# Patient Record
Sex: Male | Born: 1945
Health system: Southern US, Community
[De-identification: ages and names within clinical notes are randomized; demographics above are authoritative.]

## PROBLEM LIST (undated history)

## (undated) DIAGNOSIS — G510 Bell's palsy: Secondary | ICD-10-CM

## (undated) DIAGNOSIS — N200 Calculus of kidney: Secondary | ICD-10-CM

## (undated) DIAGNOSIS — T783XXA Angioneurotic edema, initial encounter: Secondary | ICD-10-CM

## (undated) DIAGNOSIS — M109 Gout, unspecified: Secondary | ICD-10-CM

## (undated) DIAGNOSIS — I219 Acute myocardial infarction, unspecified: Secondary | ICD-10-CM

## (undated) DIAGNOSIS — G43909 Migraine, unspecified, not intractable, without status migrainosus: Secondary | ICD-10-CM

## (undated) DIAGNOSIS — E7800 Pure hypercholesterolemia, unspecified: Secondary | ICD-10-CM

## (undated) DIAGNOSIS — I714 Abdominal aortic aneurysm, without rupture, unspecified: Secondary | ICD-10-CM

## (undated) DIAGNOSIS — I2510 Atherosclerotic heart disease of native coronary artery without angina pectoris: Secondary | ICD-10-CM

## (undated) DIAGNOSIS — I739 Peripheral vascular disease, unspecified: Secondary | ICD-10-CM

## (undated) DIAGNOSIS — M1990 Unspecified osteoarthritis, unspecified site: Secondary | ICD-10-CM

## (undated) DIAGNOSIS — Z95810 Presence of automatic (implantable) cardiac defibrillator: Secondary | ICD-10-CM

## (undated) DIAGNOSIS — I509 Heart failure, unspecified: Secondary | ICD-10-CM

## (undated) DIAGNOSIS — K439 Ventral hernia without obstruction or gangrene: Secondary | ICD-10-CM

## (undated) DIAGNOSIS — I779 Disorder of arteries and arterioles, unspecified: Secondary | ICD-10-CM

## (undated) DIAGNOSIS — I10 Essential (primary) hypertension: Secondary | ICD-10-CM

## (undated) DIAGNOSIS — E119 Type 2 diabetes mellitus without complications: Secondary | ICD-10-CM

## (undated) DIAGNOSIS — I255 Ischemic cardiomyopathy: Secondary | ICD-10-CM

## (undated) DIAGNOSIS — G4730 Sleep apnea, unspecified: Secondary | ICD-10-CM

## (undated) HISTORY — DX: Heart failure, unspecified: I50.9

## (undated) HISTORY — DX: Ischemic cardiomyopathy: I25.5

## (undated) HISTORY — DX: Acute myocardial infarction, unspecified: I21.9

## (undated) HISTORY — PX: EYE SURGERY: SHX253

## (undated) HISTORY — DX: Calculus of kidney: N20.0

## (undated) HISTORY — DX: Abdominal aortic aneurysm, without rupture, unspecified: I71.40

## (undated) HISTORY — DX: Essential (primary) hypertension: I10

## (undated) HISTORY — DX: Atherosclerotic heart disease of native coronary artery without angina pectoris: I25.10

## (undated) HISTORY — DX: Abdominal aortic aneurysm, without rupture: I71.4

## (undated) HISTORY — DX: Bell's palsy: G51.0

## (undated) HISTORY — DX: Type 2 diabetes mellitus without complications: E11.9

## (undated) HISTORY — DX: Peripheral vascular disease, unspecified: I73.9

## (undated) HISTORY — PX: CARDIAC CATHETERIZATION: SHX172

## (undated) HISTORY — DX: Disorder of arteries and arterioles, unspecified: I77.9

## (undated) HISTORY — PX: EXTRACORPOREAL SHOCK WAVE LITHOTRIPSY: SHX1557

## (undated) HISTORY — DX: Angioneurotic edema, initial encounter: T78.3XXA

---
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
## 1999-05-23 HISTORY — PX: CORONARY ARTERY BYPASS GRAFT: SHX141

## 2000-04-12 ENCOUNTER — Inpatient Hospital Stay (HOSPITAL_COMMUNITY): Admission: EM | Admit: 2000-04-12 | Discharge: 2000-04-21 | Payer: Self-pay | Admitting: Cardiology

## 2000-04-14 ENCOUNTER — Encounter: Payer: Self-pay | Admitting: Cardiothoracic Surgery

## 2000-04-16 ENCOUNTER — Encounter: Payer: Self-pay | Admitting: Cardiothoracic Surgery

## 2000-04-17 ENCOUNTER — Encounter: Payer: Self-pay | Admitting: Cardiothoracic Surgery

## 2000-04-18 ENCOUNTER — Encounter: Payer: Self-pay | Admitting: Cardiothoracic Surgery

## 2000-04-19 ENCOUNTER — Encounter: Payer: Self-pay | Admitting: Cardiothoracic Surgery

## 2003-11-27 ENCOUNTER — Inpatient Hospital Stay (HOSPITAL_COMMUNITY): Admission: EM | Admit: 2003-11-27 | Discharge: 2003-12-01 | Payer: Self-pay | Admitting: Cardiology

## 2004-04-11 ENCOUNTER — Ambulatory Visit: Payer: Self-pay | Admitting: Cardiology

## 2004-05-11 ENCOUNTER — Ambulatory Visit: Payer: Self-pay | Admitting: Cardiology

## 2007-05-23 HISTORY — PX: DUPUYTREN CONTRACTURE RELEASE: SHX1478

## 2007-08-26 ENCOUNTER — Ambulatory Visit: Payer: Self-pay | Admitting: Cardiology

## 2007-09-04 ENCOUNTER — Encounter: Payer: Self-pay | Admitting: Cardiology

## 2007-09-05 ENCOUNTER — Ambulatory Visit: Payer: Self-pay | Admitting: Cardiology

## 2007-09-09 ENCOUNTER — Ambulatory Visit: Payer: Self-pay | Admitting: Cardiology

## 2007-09-10 ENCOUNTER — Ambulatory Visit: Payer: Self-pay | Admitting: Cardiology

## 2007-09-11 ENCOUNTER — Ambulatory Visit: Payer: Self-pay | Admitting: Cardiology

## 2007-09-11 ENCOUNTER — Ambulatory Visit (HOSPITAL_COMMUNITY): Admission: RE | Admit: 2007-09-11 | Discharge: 2007-09-12 | Payer: Self-pay | Admitting: Cardiology

## 2007-09-19 ENCOUNTER — Ambulatory Visit: Payer: Self-pay | Admitting: Cardiology

## 2007-10-25 ENCOUNTER — Ambulatory Visit: Payer: Self-pay | Admitting: Cardiology

## 2007-11-21 ENCOUNTER — Ambulatory Visit: Payer: Self-pay | Admitting: Cardiology

## 2007-11-29 ENCOUNTER — Ambulatory Visit: Payer: Self-pay | Admitting: Cardiology

## 2007-11-29 ENCOUNTER — Ambulatory Visit: Payer: Self-pay | Admitting: Vascular Surgery

## 2008-02-10 ENCOUNTER — Encounter: Payer: Self-pay | Admitting: Cardiology

## 2008-02-17 ENCOUNTER — Ambulatory Visit: Payer: Self-pay | Admitting: Cardiology

## 2008-03-04 ENCOUNTER — Encounter: Payer: Self-pay | Admitting: Cardiology

## 2008-05-25 ENCOUNTER — Encounter: Payer: Self-pay | Admitting: Cardiology

## 2008-05-29 ENCOUNTER — Ambulatory Visit: Payer: Self-pay | Admitting: Vascular Surgery

## 2008-07-07 ENCOUNTER — Ambulatory Visit: Payer: Self-pay | Admitting: Cardiology

## 2009-02-04 ENCOUNTER — Encounter: Payer: Self-pay | Admitting: Cardiology

## 2009-02-04 ENCOUNTER — Encounter: Payer: Self-pay | Admitting: Physician Assistant

## 2009-02-05 ENCOUNTER — Encounter: Payer: Self-pay | Admitting: Cardiology

## 2009-02-06 ENCOUNTER — Encounter: Payer: Self-pay | Admitting: Cardiology

## 2009-02-06 ENCOUNTER — Ambulatory Visit: Payer: Self-pay | Admitting: Cardiology

## 2009-02-07 ENCOUNTER — Encounter: Payer: Self-pay | Admitting: Cardiology

## 2009-02-08 ENCOUNTER — Encounter: Payer: Self-pay | Admitting: Cardiology

## 2009-02-22 ENCOUNTER — Ambulatory Visit: Payer: Self-pay | Admitting: Cardiology

## 2009-02-22 DIAGNOSIS — R002 Palpitations: Secondary | ICD-10-CM

## 2009-02-22 DIAGNOSIS — E785 Hyperlipidemia, unspecified: Secondary | ICD-10-CM | POA: Insufficient documentation

## 2009-02-22 DIAGNOSIS — I5022 Chronic systolic (congestive) heart failure: Secondary | ICD-10-CM

## 2009-02-24 ENCOUNTER — Encounter: Payer: Self-pay | Admitting: Physician Assistant

## 2009-03-01 ENCOUNTER — Encounter (INDEPENDENT_AMBULATORY_CARE_PROVIDER_SITE_OTHER): Payer: Self-pay | Admitting: *Deleted

## 2009-03-04 ENCOUNTER — Encounter: Payer: Self-pay | Admitting: Physician Assistant

## 2009-03-08 ENCOUNTER — Telehealth (INDEPENDENT_AMBULATORY_CARE_PROVIDER_SITE_OTHER): Payer: Self-pay | Admitting: *Deleted

## 2009-03-08 ENCOUNTER — Encounter: Payer: Self-pay | Admitting: Physician Assistant

## 2009-03-30 ENCOUNTER — Ambulatory Visit: Payer: Self-pay | Admitting: Cardiology

## 2009-03-30 DIAGNOSIS — I2510 Atherosclerotic heart disease of native coronary artery without angina pectoris: Secondary | ICD-10-CM | POA: Insufficient documentation

## 2009-03-30 DIAGNOSIS — I739 Peripheral vascular disease, unspecified: Secondary | ICD-10-CM | POA: Insufficient documentation

## 2009-03-30 DIAGNOSIS — Z951 Presence of aortocoronary bypass graft: Secondary | ICD-10-CM | POA: Insufficient documentation

## 2009-03-30 DIAGNOSIS — E1149 Type 2 diabetes mellitus with other diabetic neurological complication: Secondary | ICD-10-CM

## 2009-03-30 DIAGNOSIS — I714 Abdominal aortic aneurysm, without rupture, unspecified: Secondary | ICD-10-CM | POA: Insufficient documentation

## 2009-04-01 ENCOUNTER — Encounter: Payer: Self-pay | Admitting: Cardiology

## 2009-04-06 ENCOUNTER — Encounter: Payer: Self-pay | Admitting: Cardiology

## 2009-04-09 ENCOUNTER — Telehealth (INDEPENDENT_AMBULATORY_CARE_PROVIDER_SITE_OTHER): Payer: Self-pay | Admitting: *Deleted

## 2009-04-14 ENCOUNTER — Encounter: Payer: Self-pay | Admitting: Cardiology

## 2009-04-26 ENCOUNTER — Ambulatory Visit: Payer: Self-pay | Admitting: Cardiovascular Disease

## 2009-04-27 ENCOUNTER — Telehealth (INDEPENDENT_AMBULATORY_CARE_PROVIDER_SITE_OTHER): Payer: Self-pay | Admitting: *Deleted

## 2009-04-28 ENCOUNTER — Encounter: Payer: Self-pay | Admitting: Cardiovascular Disease

## 2009-04-28 ENCOUNTER — Ambulatory Visit: Payer: Self-pay | Admitting: Vascular Surgery

## 2009-05-25 ENCOUNTER — Ambulatory Visit: Payer: Self-pay | Admitting: Cardiology

## 2009-05-25 DIAGNOSIS — I724 Aneurysm of artery of lower extremity: Secondary | ICD-10-CM | POA: Insufficient documentation

## 2009-05-26 ENCOUNTER — Encounter: Payer: Self-pay | Admitting: Cardiology

## 2009-07-15 ENCOUNTER — Encounter: Payer: Self-pay | Admitting: Cardiology

## 2009-08-02 ENCOUNTER — Encounter: Payer: Self-pay | Admitting: Cardiology

## 2009-08-17 ENCOUNTER — Encounter: Payer: Self-pay | Admitting: Cardiology

## 2009-09-14 ENCOUNTER — Ambulatory Visit: Payer: Self-pay | Admitting: Cardiology

## 2009-09-16 ENCOUNTER — Telehealth (INDEPENDENT_AMBULATORY_CARE_PROVIDER_SITE_OTHER): Payer: Self-pay | Admitting: *Deleted

## 2009-09-23 ENCOUNTER — Ambulatory Visit: Payer: Self-pay | Admitting: Cardiovascular Disease

## 2009-09-23 DIAGNOSIS — I70219 Atherosclerosis of native arteries of extremities with intermittent claudication, unspecified extremity: Secondary | ICD-10-CM | POA: Insufficient documentation

## 2009-09-27 ENCOUNTER — Telehealth (INDEPENDENT_AMBULATORY_CARE_PROVIDER_SITE_OTHER): Payer: Self-pay | Admitting: *Deleted

## 2009-10-06 ENCOUNTER — Encounter: Payer: Self-pay | Admitting: Cardiology

## 2009-10-06 ENCOUNTER — Ambulatory Visit: Payer: Self-pay | Admitting: Cardiovascular Disease

## 2009-10-06 ENCOUNTER — Ambulatory Visit (HOSPITAL_COMMUNITY): Admission: RE | Admit: 2009-10-06 | Discharge: 2009-10-06 | Payer: Self-pay | Admitting: Cardiovascular Disease

## 2009-10-08 ENCOUNTER — Telehealth: Payer: Self-pay | Admitting: Cardiovascular Disease

## 2009-10-08 ENCOUNTER — Encounter: Payer: Self-pay | Admitting: Cardiovascular Disease

## 2009-10-08 ENCOUNTER — Ambulatory Visit: Payer: Self-pay

## 2009-11-01 ENCOUNTER — Encounter: Payer: Self-pay | Admitting: Cardiology

## 2009-11-05 ENCOUNTER — Ambulatory Visit: Payer: Self-pay | Admitting: Vascular Surgery

## 2009-11-05 ENCOUNTER — Encounter: Payer: Self-pay | Admitting: Cardiovascular Disease

## 2009-11-26 ENCOUNTER — Encounter: Payer: Self-pay | Admitting: Cardiology

## 2009-11-26 DIAGNOSIS — Z951 Presence of aortocoronary bypass graft: Secondary | ICD-10-CM

## 2009-11-30 ENCOUNTER — Encounter: Payer: Self-pay | Admitting: Cardiology

## 2009-11-30 ENCOUNTER — Ambulatory Visit: Payer: Self-pay | Admitting: Cardiology

## 2009-12-14 ENCOUNTER — Ambulatory Visit: Payer: Self-pay | Admitting: Cardiology

## 2009-12-14 DIAGNOSIS — M109 Gout, unspecified: Secondary | ICD-10-CM

## 2010-05-10 ENCOUNTER — Ambulatory Visit: Payer: Self-pay | Admitting: Vascular Surgery

## 2010-05-10 ENCOUNTER — Encounter: Payer: Self-pay | Admitting: Cardiology

## 2010-05-10 ENCOUNTER — Encounter: Payer: Self-pay | Admitting: Vascular Surgery

## 2010-06-15 ENCOUNTER — Ambulatory Visit
Admission: RE | Admit: 2010-06-15 | Discharge: 2010-06-15 | Payer: Self-pay | Source: Home / Self Care | Attending: Cardiology | Admitting: Cardiology

## 2010-06-16 ENCOUNTER — Telehealth (INDEPENDENT_AMBULATORY_CARE_PROVIDER_SITE_OTHER): Payer: Self-pay | Admitting: *Deleted

## 2010-06-17 DIAGNOSIS — R002 Palpitations: Secondary | ICD-10-CM

## 2010-06-23 NOTE — Assessment & Plan Note (Signed)
Summary: 3 mo fu per april reminder-srs   Visit Type:  Follow-up Referring Provider:  Dr Dannielle Burn Primary Provider:  Monico Blitz, MD  CC:  follow-up visit.  History of Present Illness: the patient is a 65 year old male with history of coronary artery disease status post coronary bypass grafting. The patient has ischemic cardiomyopathy. He has exertional angina on medical therapy. He had a catheterization several months ago. He was found to have a long lesion in the vein graft to the obtuse marginal branch which was not amenable to PCI. He is severely limited in his physical activity by claudication in the right leg. He has a known occlusion of the right SFA with an ABI of 0.5. The patient states that his pain in his right leg has become increasingly worse to the point where he is very limited in his activity. Ihe's not able to walk from front to back at [facility]. He is to rest several times because of leg pain but also because of dyspnea and rare occasions angina. He reports several weeks ago that he had several days of social chest pain on minimal exertion which required increasing his nitroglycerin. Those symptoms have now abated. The patient has not been able to resume cardiac rehabilitation due to her recent bout of gout. Of note also is that the patient is followed in [place] for abdominal aortic aneurysm is due for repeat CT scan in June. The patient's lower extremity pain also includes a component of diabetic peripheral neuropathy which has improved with gabapentin. However sluggish his right leg especially worse. Although his angina pattern is now stable he has quite significant angina on exertion. Last office visit his Imdur was increased to120 mg a day.  Preventive Screening-Counseling & Management  Alcohol-Tobacco     Smoking Status: quit     Year Quit: 2009  Current Problems (verified): 1)  Aneurysm of Artery of Lower Extremity  (ICD-442.3) 2)  Aaa  (ICD-441.4) 3)  Diabetic  Peripheral Neuropathy  (ICD-250.60) 4)  Claudication  (ICD-443.9) 5)  Left Ventricular Function, Decreased  (ICD-429.2) 6)  Coronary Artery Bypass Graft, Hx of  (ICD-V45.81) 7)  Dyslipidemia  (ICD-272.4) 8)  Palpitations  (ICD-785.1) 9)  Unspecified Essential Hypertension  (ICD-401.9) 10)  Chronic Systolic Heart Failure  (123456)  Current Medications (verified): 1)  Coreg 25 Mg Tabs (Carvedilol) .... Take 1 Tablet By Mouth Two Times A Day  (Place On File) 2)  Imdur 120 Mg Xr24h-Tab (Isosorbide Mononitrate) .... Take 1 1/2 Tabs (180mg ) Daily 3)  Norvasc 5 Mg Tabs (Amlodipine Besylate) .... Take 1 Tablet By Mouth Once A Day 4)  Furosemide 40 Mg Tabs (Furosemide) .... Take Two Tablet By Mouth in Am and One in Pm 5)  Metformin Hcl 1000 Mg Tabs (Metformin Hcl) .... Take 1 Tablet By Mouth Two Times A Day 6)  Glipizide 10 Mg Tabs (Glipizide) .... Take 1 Tablet By Mouth Two Times A Day 7)  Simvastatin 10 Mg Tabs (Simvastatin) .... Take One Tablet By Mouth Daily At Bedtime 8)  Nitrostat 0.4 Mg Subl (Nitroglycerin) .... Dissolve One Tablet Under Tongue For Severe Chest Pain As Needed Every 5 Minutes, Not To Exceed 3 in 15 Min Time Frame 9)  Aspirin 81 Mg Tbec (Aspirin) .... Take 1 Tablet By Mouth Once A Day 10)  Tart Cherry Advanced  Caps (Misc Natural Products) .... Take 1 Tablet By Mouth Once A Day 11)  Vitamin C Cr 500 Mg Cr-Caps (Ascorbic Acid) .... Take 1 Capsule By Mouth Once  A Day 12)  Neurontin 300 Mg Caps (Gabapentin) .... Take 1 Tablet By Mouth Two Times A Day  Allergies (verified): 1)  ! Lisinopril 2)  ! Codeine  Comments:  Nurse/Medical Assistant: The patient's medications and allergies were reviewed with the patient and were updated in the Medication and Allergy Lists. Bottles reviewed.  Clinical Review Panels:  Echocardiogram Echocardiogram LV chamber size mildly dilated. Mild concentric left ventricular hypertrophy. Moderately decreased LV systolic function. Ejection  fraction 35-40% with multiple segmental wall motion abnormalities. Abnormal diastolic function Moderate left atrial enlargement Trace mitral regurgitation Trace tricuspid regurgitation No pericardial effusion. (02/22/2009)  Vascular Studies ABI's Mild arterial occlusive disease involving the bursitis pedis artery on the left.the posterior tibial and the left is triphasic therefore no significant arterial occlusive disease in the left lower extremity. In the right lower extremity severe arterial occlusive disease his present, involving both inflow from the iliac vasculature or superficial femoral vessels as well as the femoral popliteal system. (04/01/2009)  Cardiac Imaging Cardiac Cath Findings there is moderate reduction in left ventricular function, and an inferior wall motion abnormality. The inferior wall does appear to be relatively akinetic.  The vein graft to the diagonal intermediate is intact as is the large mammary and the distal right is fairly well collateralizeds and maybe infarcted nonetheless.  The only change appears to be in the circumflex distribution with a marked with a marginal is jeopardized.  It is not very favorable vessel for percutaneous intervention Dr. Burt Knack and I have reviewed the films and there is a long total occlusion that does not appear to be favorable for intervention.  We would favor medical treatment with early follow-up and we will add nitrates to the regime.  He will follow-up with Dr Lutricia Feil (10/26/2007)    Past History:  Past Medical History: Last updated: 04/26/2009 exertional angina status post patent bypass graft along the greaterstaff in this vein graf tto an obtuse marginal branch correlating with positive Cardiolite study Ejection fraction of 30 to 35%. Ongoing angina symptoms despite increasing nitrates. Ischemic cardiomyopathy. Palpitations. angioedema secondary to ACE inhibitor therapy. Status post Bell palsy. diabetes  mellitus. Abdominal aortic aneurysm. Hypertension. History of tobacco use. Peripheral arterial disease with total occlusion of the right SFA and an ABI 0.5 on the right side.  Family History: Last updated: 03/30/2009 noncontributory  Social History: Last updated: 03/30/2009 Tobacco Use - Former.   Risk Factors: Smoking Status: quit (09/14/2009)  Review of Systems       The patient complains of weight gain/loss, chest pain, shortness of breath, and depression.  The patient denies fatigue, malaise, fever, vision loss, decreased hearing, hoarseness, palpitations, prolonged cough, wheezing, sleep apnea, coughing up blood, abdominal pain, blood in stool, nausea, vomiting, diarrhea, heartburn, incontinence, blood in urine, muscle weakness, joint pain, leg swelling, rash, skin lesions, headache, fainting, dizziness, anxiety, enlarged lymph nodes, easy bruising or bleeding, and environmental allergies.         claudication  Vital Signs:  Patient profile:   65 year old male Height:      73 inches Weight:      268 pounds Pulse rate:   57 / minute BP sitting:   103 / 58  (left arm) Cuff size:   large  Vitals Entered By: Georgina Peer (September 14, 2009 10:50 AM) CC: follow-up visit   Physical Exam  Additional Exam:  General: Well-developed, well-nourished in no distress head: Normocephalic and atraumatic eyes PERRLA/EOMI intact, conjunctiva and lids normal nose: No deformity  or lesions mouth normal dentition, normal posterior pharynx neck: Supple, no JVD.  No masses, thyromegaly or abnormal cervical nodes lungs: Normal breath sounds bilaterally without wheezing.  Normal percussion heart: regular rate and rhythm with normal S1 and S2, no S3 or S4.  PMI is normal.  No pathological murmurs abdomen: Normal bowel sounds, abdomen is soft and nontender without masses, organomegaly or hernias noted.  No hepatosplenomegaly musculoskeletal: Back normal, normal gait muscle strength and tone  normal pulsus: unable to palpate pulse in right leg dorsalis pedis and posterior tibial pulse. Pulse was palpable in the left leg Extremities: Trace edema neurologic: Alert and oriented x 3 skin: Intact without lesions or rashes cervical nodes: No significant adenopathy psychologic: Normal affect    Impression & Recommendations:  Problem # 1:  LEFT VENTRICULAR FUNCTION, DECREASED (ICD-429.2) ejection fraction stable at 35-40%. No evidence of acute heart failure symptoms.  Problem # 2:  CORONARY ARTERY BYPASS GRAFT, HX OF (ICD-V45.81) the patient does have angina albeit in a stable pattern. He does have a long lesion in the saphenous vein graft to obtuse marginal graft that is not amenable to PCI. I increased his isosorbide mononitrate 180 milligram p.o. q. daily. If the patient continues to have chest pain the addition of her neck said to be considered  Problem # 3:  AAA (ICD-441.4) the patient is due for a CT scan in July of this year. He he is followed by vascular surgery in [place].  Problem # 4:  DIABETIC PERIPHERAL NEUROPATHY (ICD-250.60) improved with gabapentin His updated medication list for this problem includes:    Metformin Hcl 1000 Mg Tabs (Metformin hcl) .Marland Kitchen... Take 1 tablet by mouth two times a day    Glipizide 10 Mg Tabs (Glipizide) .Marland Kitchen... Take 1 tablet by mouth two times a day    Aspirin 81 Mg Tbec (Aspirin) .Marland Kitchen... Take 1 tablet by mouth once a day  Problem # 5:  CLAUDICATION (ICD-443.9) the patient has documented right SFA occlusion with an ABI of 0.5. His claudication however has significantly worsened in the right leg and the patient is questioning whether anything further can be done. I told him that certainly bypass grafting could be considered at the time of aortic aneurysm surgery. However we will refer patient to Dr. Burt Knack to see if any other type of intervention can be considered in the meanwhile.  Patient Instructions: 1)  Increase Imdur to 180mg  daily 2)   Need follow up appointment with Dr. Burt Knack  3)  Follow up in  3 months Prescriptions: IMDUR 120 MG XR24H-TAB (ISOSORBIDE MONONITRATE) take 1 1/2 tabs (180mg ) daily  #45 x 6   Entered by:   Lovina Reach, LPN   Authorized by:   Terald Sleeper, MD, [facility]   Signed by:   Lovina Reach, LPN on 624THL   Method used:   Electronically to        [facility]* (retail)       [address], [place]  [address]       Ph: [phone]       Fax: [phone]   RxID:   [id number]

## 2010-06-23 NOTE — Miscellaneous (Signed)
Summary: Rehab Report/ CARDIAC REHAB PROGRESS REPORT  Rehab Report/ CARDIAC REHAB PROGRESS REPORT   Imported By: Bartholomew Boards 08/03/2009 09:14:23  _____________________________________________________________________  External Attachment:    Type:   Image     Comment:   External Document

## 2010-06-23 NOTE — Miscellaneous (Signed)
Summary: 2 D ECHO  Clinical Lists Changes  Problems: Added new problem of SHORTNESS OF BREATH (ICD-786.05) Orders: Added new Referral order of 2-D Echocardiogram (2D Echo) - Signed

## 2010-06-23 NOTE — Letter (Signed)
Summary: [facility]   [facility]   Imported By: Sallee Provencal 12/01/2009 15:34:29  _____________________________________________________________________  External Attachment:    Type:   Image     Comment:   External Document

## 2010-06-23 NOTE — Progress Notes (Signed)
Summary: Groin [place]  Phone Note Call from Patient   Caller: Patient Call For: Nurse Summary of Call: I spoke with the pt and he called because of bruising and swelling at his groin site.  The pt said yesterday the bruising was the size of a quater.  Today the bruising has spread to his groin area.  The pt does have some swelling at this site.  The pt denies pain but does have some soreness.  I will arrange for the pt to come into the office today for a groin duplex. Initial call taken by: Theodosia Quay, RN, BSN,  Oct 08, 2009 1:17 PM

## 2010-06-23 NOTE — Progress Notes (Signed)
Summary: low bp  Phone Note Call from Patient   Summary of Call: 78/48 blood pressure dropped this morning during reham.   Just checked 95/51 most recently.  Stated he did feel the same way he feels when sugar drops.  Now feels lightheaded and weak.  States he really feels like it does when sugar is low.  States he has been drinking lots of water, staying thirsty all the time.  Informed patient that may be related to his diabetes not being well controlled.  Suggested he drink G2 - watch sugar intake due to diabetes.  Just recently increased Imdur at last OV.  Advised pt. to go to ER for evaluation if symptoms worsen. Patient verbalized understanding.  Initial call taken by: Lovina Reach, LPN,  April 28, 624THL 5:15 PM  Follow-up for Phone Call        Also, said that you suggested that he get a walker, but needs rx sent to [facility].   Lovina Reach, LPN  April 29, 624THL [id number] PM   Additional Follow-up for Phone Call Additional follow up Details #1::        Please go ahead and do this. He also needs RN visit this week for orthostatics. May need to adjust meds.  Additional Follow-up by: Terald Sleeper, MD, [facility],  Sep 20, 2009 12:57 PM    Additional Follow-up for Phone Call Additional follow up Details #2::    Pt. notified.  Seems to be doing better now.  Offered nurse vistit for tomorrow or Friday.  Has appt. with Dr. Burt Knack in the morning.  Also, has appt. with PMD Manuella Ghazi) on Friday at 11:45.  Advised him to follow up with PMD about these issues since he already has this appointment scheduled.  Will send order for walker with seat to [facility] at his request.  Also, advised him that if PMD feels he needs to be seen sooner by [place] to please call and request.  Patient verbalized understanding.  Lovina Reach, LPN  May  4, 624THL [id number] PM  Follow-up by: Terald Sleeper, MD, [facility],  Sep 26, 2009 4:24 PM  New/Updated Medications: STEP N REST WALKER  MISC (MISC. DEVICES) use as directed Prescriptions: STEP N REST WALKER  MISC  (MISC. DEVICES) use as directed  #1 x 1   Entered by:   Lovina Reach, LPN   Authorized by:   Terald Sleeper, MD, [facility]   Signed by:   Lovina Reach, LPN on [id number]   Method used:   Electronically to        [facility]* (retail)       [address] [place], [place]  [address]       Ph: [phone]       Fax: [phone]   RxID:   [id number]

## 2010-06-23 NOTE — Assessment & Plan Note (Signed)
Summary: f36m   Visit Type:  6 months follow up Referring Provider:  Dr Dannielle Burn Primary Provider:  Monico Blitz, MD  CC:  Right leg pain.  History of Present Illness: 65 year-old gentleman presenting for follow-up of lower extremity PAD. He was seen several months ago for intermittent claudication. Since that time he reports worsening right leg pain with ambulation, predominately in the right calf. He complains of severe right calf and foot pain with less than 50 feet of walking. He has to stop and rest several times while shopping in a department store. He denies rest pain. Reports minimal symptoms on the left leg.   His right ABI is 0.5 and CTA showed total occlusion of the right SFA, reconsitituting in the above knee popliteal artery.  Current Medications (verified): 1)  Coreg 25 Mg Tabs (Carvedilol) .... Take 1 Tablet By Mouth Two Times A Day  (Place On File) 2)  Imdur 120 Mg Xr24h-Tab (Isosorbide Mononitrate) .... Take 1 1/2 Tabs (180mg ) Daily 3)  Norvasc 5 Mg Tabs (Amlodipine Besylate) .... Take 1 Tablet By Mouth Once A Day 4)  Furosemide 40 Mg Tabs (Furosemide) .... Take Two Tablet By Mouth in Am and One in Pm 5)  Metformin Hcl 1000 Mg Tabs (Metformin Hcl) .... Take 1 Tablet By Mouth Two Times A Day 6)  Glipizide 10 Mg Tabs (Glipizide) .... Take 1 Tablet By Mouth Two Times A Day 7)  Simvastatin 10 Mg Tabs (Simvastatin) .... Take One Tablet By Mouth Daily At Bedtime 8)  Nitrostat 0.4 Mg Subl (Nitroglycerin) .... Dissolve One Tablet Under Tongue For Severe Chest Pain As Needed Every 5 Minutes, Not To Exceed 3 in 15 Min Time Frame 9)  Aspirin 81 Mg Tbec (Aspirin) .... Take 1 Tablet By Mouth Two Times A Day 10)  Tart Cherry Advanced  Caps (Misc Natural Products) .... Take 1 Tablet By Mouth Once A Day 11)  Vitamin C Cr 500 Mg Cr-Caps (Ascorbic Acid) .... Take 1 Capsule By Mouth Once A Day 12)  Neurontin 300 Mg Caps (Gabapentin) .... Take 1 Tablet By Mouth Two Times A Day 13)  Step N Rest  Walker  Misc (Misc. Devices) .... Use As Directed  Allergies: 1)  ! Lisinopril 2)  ! Codeine  Past History:  Past medical history reviewed for relevance to current acute and chronic problems.  Past Medical History: Reviewed history from 04/26/2009 and no changes required. exertional angina status post patent bypass graft along the greaterstaff in this vein graf tto an obtuse marginal branch correlating with positive Cardiolite study Ejection fraction of 30 to 35%. Ongoing angina symptoms despite increasing nitrates. Ischemic cardiomyopathy. Palpitations. angioedema secondary to ACE inhibitor therapy. Status post Bell palsy. diabetes mellitus. Abdominal aortic aneurysm. Hypertension. History of tobacco use. Peripheral arterial disease with total occlusion of the right SFA and an ABI 0.5 on the right side.  Review of Systems       Positive for exertional angina, improved with increased nitrates. Otherwise negative except as per HPI.  Vital Signs:  Patient profile:   65 year old male Height:      73 inches Weight:      269 pounds BMI:     35.62 Pulse rate:   64 / minute Pulse rhythm:   regular Resp:     18 per minute BP sitting:   100 / 64  (left arm) Cuff size:   large  Vitals Entered By: Sidney Ace (Sep 23, 2009 10:20 AM)  Serial  Vital Signs/Assessments:  Time      Position  BP       Pulse  Resp  Temp     By           R Arm     102/60                         Sidney Ace   Physical Exam  General:  Pt is alert and oriented,  obese male, in no acute distress. HEENT: normal Neck: normal carotid upstrokes without bruits, JVP normal Lungs: CTA CV: RRR without murmur or gallop Abd: soft, NT, positive BS, no bruit, no organomegaly Ext: no clubbing, cyanosis, or edema. femoral pulses are 2+=, pedals not palpable. Skin: warm and dry without rash    Impression & Recommendations:  Problem # 1:  ATHEROSLERO NATV ART EXTREM W/INTERMIT CLAUDICAT (ICD-440.21) The  patient has severe and progressive right calf claudication, now with low-level activity.  While he does not have resting leg ischemia, his symptoms warrant further evaluation and treatment. Recommend moving forward with a lower extremity angiogram with consideration for PTA depending on whether his anatomy is favorable for this. Risks and indications of angiography/PTA reviewed with the patient in detail and he agrees to proceed.  Will need to consider his CHF and ischemic heart disease when determining best revascularization strategy (endovascular versus surgical).  Orders: PV Procedure (PV Procedure)  Patient Instructions: 1)  Your physician has requested that you have a peripheral vascular angiogram. This exam is performed at the hospital. During this exam IV contrast is used to look at arterial blood flow.  Please review the information sheet given for details. 2)  Your physician recommends that you continue on your current medications as directed. Please refer to the Current Medication list given to you today.

## 2010-06-23 NOTE — Assessment & Plan Note (Signed)
Summary: 1  mo fu -srs   Visit Type:  Follow-up Referring Provider:  Dr Dannielle Burn Primary Provider:  Monico Blitz, MD  CC:  follow-up visit.  History of Present Illness: the patient is a 65 year old male with a history of coronary artery disease status post coronary bypass grafting and ischemic cardiomyopathy. He has an ejection fraction of 35-40%. He has an abdominal aortic aneurysm followed in [facility] by Dr. early. The patient also has significant peripheral vascular disease with claudication right leg as well as neuropathic pains in both lower extremities secondary to diabetes. The patient stated he had an episode of severe angina around Christmas. This was induced by the cold weather. He reports that this was his first angina back in a long while. He has been doing cardiac rehabilitation and has not experienced any chest pain. However he states that most of his exercises he does sitting down with some minor exercise on the treadmill. He usually develops leg pain on the treadmill. He has been started on gabapentin with improvement in his neuropathic pains. He currently denies any orthopnea PND palpitations or syncope. The patient had a catheterization done earlier this year and was not felt long lesion in the vein graft to the obtuse marginal branch which was not amenable to percutaneous coronary intervention.   Preventive Screening-Counseling & Management  Alcohol-Tobacco     Smoking Status: quit     Year Quit: 2009  Current Problems (verified): 1)  Aneurysm of Artery of Lower Extremity  (ICD-442.3) 2)  Aaa  (ICD-441.4) 3)  Diabetic Peripheral Neuropathy  (ICD-250.60) 4)  Claudication  (ICD-443.9) 5)  Left Ventricular Function, Decreased  (ICD-429.2) 6)  Coronary Artery Bypass Graft, Hx of  (ICD-V45.81) 7)  Dyslipidemia  (ICD-272.4) 8)  Palpitations  (ICD-785.1) 9)  Unspecified Essential Hypertension  (ICD-401.9) 10)  Chronic Systolic Heart Failure  (123456)  Current  Medications (verified): 1)  Coreg 25 Mg Tabs (Carvedilol) .... Take 1 Tablet By Mouth Two Times A Day  (Place On File) 2)  Imdur 120 Mg Xr24h-Tab (Isosorbide Mononitrate) .... Take 1 Tablet By Mouth Once A Day (Place On File) 3)  Norvasc 5 Mg Tabs (Amlodipine Besylate) .... Take 1 Tablet By Mouth Once A Day 4)  Furosemide 40 Mg Tabs (Furosemide) .... Take Two Tablet By Mouth in Am and One in Pm 5)  Metformin Hcl 1000 Mg Tabs (Metformin Hcl) .... Take 1 Tablet By Mouth Two Times A Day 6)  Glipizide 5 Mg Tabs (Glipizide) .... Take 1 Tablet By Mouth Two Times A Day 7)  Ropinirole Hcl 1 Mg Tabs (Ropinirole Hcl) .... Take 1 Tab By Mouth At Bedtime 8)  Simvastatin 10 Mg Tabs (Simvastatin) .... Take One Tablet By Mouth Daily At Bedtime 9)  Nitrostat 0.4 Mg Subl (Nitroglycerin) .... Dissolve One Tablet Under Tongue For Severe Chest Pain As Needed Every 5 Minutes, Not To Exceed 3 in 15 Min Time Frame 10)  Aspirin 81 Mg Tbec (Aspirin) .... Take 1 Tablet By Mouth Once A Day 11)  Tart Cherry Advanced  Caps (Misc Natural Products) .... Take 1 Tablet By Mouth Once A Day 12)  Vitamin C Cr 500 Mg Cr-Caps (Ascorbic Acid) .... Take 1 Capsule By Mouth Once A Day 13)  Neurontin 300 Mg Caps (Gabapentin) .... Take 1 Tablet By Mouth Two Times A Day  (Place On File)  Allergies (verified): 1)  ! Lisinopril 2)  ! Codeine  Comments:  Nurse/Medical Assistant: The patient's medications and allergies were reviewed with the  patient and were updated in the Medication and Allergy Lists. Patient brought list to office.  Past History:  Family History: Last updated: 03/30/2009 noncontributory  Social History: Last updated: 03/30/2009 Tobacco Use - Former.   Past Medical History: Reviewed history from 04/26/2009 and no changes required. exertional angina status post patent bypass graft along the greaterstaff in this vein graf tto an obtuse marginal branch correlating with positive Cardiolite study Ejection fraction  of 30 to 35%. Ongoing angina symptoms despite increasing nitrates. Ischemic cardiomyopathy. Palpitations. angioedema secondary to ACE inhibitor therapy. Status post Bell palsy. diabetes mellitus. Abdominal aortic aneurysm. Hypertension. History of tobacco use. Peripheral arterial disease with total occlusion of the right SFA and an ABI 0.5 on the right side.  Review of Systems       The patient complains of fatigue and chest pain.  The patient denies malaise, fever, weight gain/loss, vision loss, decreased hearing, hoarseness, palpitations, shortness of breath, prolonged cough, wheezing, sleep apnea, coughing up blood, abdominal pain, blood in stool, nausea, vomiting, diarrhea, heartburn, incontinence, blood in urine, muscle weakness, joint pain, leg swelling, rash, skin lesions, headache, fainting, dizziness, depression, anxiety, enlarged lymph nodes, easy bruising or bleeding, and environmental allergies.    Vital Signs:  Patient profile:   66 year old male Height:      73 inches Weight:      275 pounds Pulse rate:   74 / minute BP sitting:   122 / 70  (left arm) Cuff size:   large  Vitals Entered By: Georgina Peer (May 25, 2009 11:18 AM) CC: follow-up visit   Physical Exam  General:  Well developed, well nourished, in no acute distress. Head:  normocephalic and atraumatic Nose:  no deformity, discharge, inflammation, or lesions Mouth:  Teeth, gums and palate normal. Oral mucosa normal. Neck:  Neck supple, no JVD. No masses, thyromegaly or abnormal cervical nodes. Lungs:  Clear bilaterally to auscultation and percussion. Heart:  Non-displaced PMI, chest non-tender; regular rate and rhythm, S1, S2 without murmurs, rubs or gallops. Carotid upstroke normal, no bruit. Normal abdominal aortic size, no bruits. Femorals normal pulses, no bruits. Pedals normal pulses. No edema, no varicosities. Abdomen:  Bowel sounds positive; abdomen soft and non-tender without masses,  organomegaly, or hernias noted. No hepatosplenomegaly. Msk:  Back normal, normal gait. Muscle strength and tone normal. Pulses:  decreased pulses in her right leg. Extremities:  No clubbing or cyanosis. Neurologic:  Alert and oriented x 3. Skin:  Intact without lesions or rashes. Cervical Nodes:  no significant adenopathy Psych:  Normal affect.   CT Scan  Procedure date:  04/14/2009  Findings:      abdominal aortic aneurysm with maximal AP diameter 5.4 cm increase from prior study Aneurysmal dilatation of the right common iliac artery Right superficial femoral artery occlusion and three-vessel runoff to the right ankle No significant occlusive disease in the left lower extremity Moderate atherosclerotic changes of the superficial femoral artery Left popliteal artery aneurysm above the knee 2.7 cm Umbilical hernia containing adipose tissue Posttraumatic changes of the spleen Right nephrolithiasis  Impression & Recommendations:  Problem # 1:  CORONARY ARTERY BYPASS GRAFT, HX OF (ICD-V45.81) the patient had an episode of angina several weeks ago around Christmas. I will increase his Imdur 120mg  a dayalso increase carvedilol to 25 mg p.o. b.i.d.if the patient has recurrent chest pain further ischemia evaluation will be necessary. Unfortunately patient has a vein graft to obtuse marginal branch that is the cause for ischemia but is not  amenable to PCI  Problem # 2:  AAA (ICD-441.4) this is followed in [place] recommendations are for follow up study in 6 months. Of note is also that the patient has occluded right superficial femoral artery with claudication right leg. He also has a popliteal aneurysm in the left leg and iliofemoral aneurysm. Further recommendations are per the vascular surgeons.  Problem # 3:  CLAUDICATION (ICD-443.9) the patient's chronic stable claudication right leg but no limb ischemia at rest. His neuropathic pain has greatly improved with gabapentin  Problem #  4:  DIABETIC PERIPHERAL NEUROPATHY (ICD-250.60) improved with gabapentin. Will further increase it to 300 mg p.o. b.i.d. His updated medication list for this problem includes:    Metformin Hcl 1000 Mg Tabs (Metformin hcl) .Marland Kitchen... Take 1 tablet by mouth two times a day    Glipizide 5 Mg Tabs (Glipizide) .Marland Kitchen... Take 1 tablet by mouth two times a day    Aspirin 81 Mg Tbec (Aspirin) .Marland Kitchen... Take 1 tablet by mouth once a day  Problem # 5:  DYSLIPIDEMIA (ICD-272.4) follow up lipid panel and LFTs. His updated medication list for this problem includes:    Simvastatin 10 Mg Tabs (Simvastatin) .Marland Kitchen... Take one tablet by mouth daily at bedtime  Problem # 6:  CHRONIC SYSTOLIC HEART FAILURE (123456) no evidence of volume overload. Continue current medical regimen. Ejection fraction 35-40% His updated medication list for this problem includes:    Coreg 25 Mg Tabs (Carvedilol) .Marland Kitchen... Take 1 tablet by mouth two times a day  (place on file)    Imdur 120 Mg Xr24h-tab (Isosorbide mononitrate) .Marland Kitchen... Take 1 tablet by mouth once a day (place on file)    Norvasc 5 Mg Tabs (Amlodipine besylate) .Marland Kitchen... Take 1 tablet by mouth once a day    Furosemide 40 Mg Tabs (Furosemide) .Marland Kitchen... Take two tablet by mouth in am and one in pm    Nitrostat 0.4 Mg Subl (Nitroglycerin) .Marland Kitchen... Dissolve one tablet under tongue for severe chest pain as needed every 5 minutes, not to exceed 3 in 15 min time frame    Aspirin 81 Mg Tbec (Aspirin) .Marland Kitchen... Take 1 tablet by mouth once a day  Problem # 7:  ANEURYSM OF ARTERY OF LOWER EXTREMITY (ICD-442.3) Assessment: Comment Only  Patient Instructions: 1)  Increase Coreg to 25mg  two times a day 2)  Increase Indur to 120mg  daily  3)  Increase Neurontin to 300mg  two times a day 4)  Follow up in 3 months. Prescriptions: NEURONTIN 300 MG CAPS (GABAPENTIN) Take 1 tablet by mouth two times a day  (PLACE ON FILE)  #60 x 2   Entered by:   Lovina Reach, LPN   Authorized by:   Terald Sleeper, MD, [facility]   Signed  by:   Lovina Reach, LPN on 624THL   Method used:   Electronically to        [facility] (retail)       [address], [place]  [address]       Ph: [phone]       Fax: [phone]   RxID:   [id number] IMDUR 120 MG XR24H-TAB (ISOSORBIDE MONONITRATE) Take 1 tablet by mouth once a day (PLACE ON FILE)  #30 x 6   Entered by:   Lovina Reach, LPN   Authorized by:   Terald Sleeper, MD, [facility]   Signed by:   Lovina Reach, LPN on 624THL  Method used:   Electronically to        [facility] (retail)       [address], [place]  [address]       Ph: [phone]       Fax: [phone]   RxID:   [id number] COREG 25 MG TABS (CARVEDILOL) Take 1 tablet by mouth two times a day  (PLACE ON FILE)  #60 x 6   Entered by:   Lovina Reach, LPN   Authorized by:   Terald Sleeper, MD, [facility]   Signed by:   Lovina Reach, LPN on 624THL   Method used:   Electronically to        [facility] (retail)       [address], [place]  [address]       Ph: [phone]       Fax: [phone]   RxID:   [id number]

## 2010-06-23 NOTE — Letter (Signed)
Summary: Peripheral Vascular  [place] [place] [address] [place]   [place], [place] [address]   Phone: [phone]  Fax: [phone]     09/23/2009 MRN: [id number]  [address] [place], [place]  [address]  Dear Mr. Grulke,   You are scheduled for Peripheral Vascular Angiogram on Wednesday Oct 06, 2009 with Dr. Burt Knack.  Please arrive at the [facility] at 8:00      a.m. on the day of your procedure.  1. DIET     _X___ Nothing to eat or drink after midnight except your medications with a sip of water.  2. MAKE SURE YOU TAKE YOUR ASPIRIN.  3. __X___ DO NOT TAKE these medications before your procedure:      DO NOT TAKE FUROSEMIDE OR GLIPIZIDE THE MORNING OF PROCEDURE.  DO NOT TAKE METFORMIN THE NIGHT BEFORE, MORNING OF, OR 48 HOURS AFTER PROCEDURE.         __X__ YOU MAY TAKE ALL of your remaining medications with a small amount of water.      4. Plan for one night stay - bring personal belongings (i.e. toothpaste, toothbrush, etc.)  5. Bring a current list of your medications and current insurance cards.  6. Must have a responsible person to drive you home.   7. Someone must be with you for the first 24 hours after you arrive home.  8. Please wear clothes that are easy to get on and off and wear slip-on shoes.  *Special note: Every effort is made to have your procedure done on time.  Occasionally there are emergencies that present themselves at the hospital that may cause delays.  Please be patient if a delay does occur.  If you have any questions after you get home, please call the office at the number listed above.   Theodosia Quay, RN, BSN

## 2010-06-23 NOTE — Assessment & Plan Note (Signed)
Summary: 6 MO FU   Visit Type:  Follow-up Referring Provider:  Dr Dannielle Burn Primary Provider:  Monico Blitz, MD   History of Present Illness: the patient is a 65 year old male with history of coronary bypass grafting, peripheral vascular disease, abdominal aortic aneurysm followed by vascular surgery.  The patient has ischemic cardio myopathy with improvement in ejection fraction on echocardiogram 6 months ago to 45 to 50%.  The patient continues to complain of claudication his right leg.  abdominal aortic aneurysm is followed as well as his peripheral vascular disease.  The patient has a known long stenosis in the vein graft causing him to have chronic stable angina.  He states that for several months he did not require any nitroglycerin but in the last few weeks has used a few.  His main concern now is that he has frequent palpitations that almost appear daily.  Initially they occur at night's that occurred during the day and sometimes lasts several minutes.  He feels weak when they occur but no shortness of breath orthopnea or PND.  He has had no presyncope or syncope associated with this.  Preventive Screening-Counseling & Management  Alcohol-Tobacco     Smoking Status: quit     Year Quit: 2009  Current Medications (verified): 1)  Coreg 25 Mg Tabs (Carvedilol) .... Take 1 Tablet By Mouth Two Times A Day  (Place On File) 2)  Imdur 120 Mg Xr24h-Tab (Isosorbide Mononitrate) .... Take 1 1/2 Tabs (180mg ) Daily 3)  Norvasc 5 Mg Tabs (Amlodipine Besylate) .... Take 1 Tablet By Mouth Once A Day 4)  Furosemide 40 Mg Tabs (Furosemide) .... Take 2 Tabs Every Morning 5)  Metformin Hcl 1000 Mg Tabs (Metformin Hcl) .... Take 1 Tablet By Mouth Two Times A Day 6)  Glipizide 10 Mg Tabs (Glipizide) .... Take 1 Tablet By Mouth Two Times A Day 7)  Simvastatin 10 Mg Tabs (Simvastatin) .... Take One Tablet By Mouth Daily At Bedtime 8)  Nitrostat 0.4 Mg Subl (Nitroglycerin) .... Dissolve One Tablet Under Tongue  For Severe Chest Pain As Needed Every 5 Minutes, Not To Exceed 3 in 15 Min Time Frame 9)  Aspirin 81 Mg Tbec (Aspirin) .... Take 1 Tablet By Mouth Two Times A Day 10)  Tart Cherry Advanced  Caps (Misc Natural Products) .... Take 1 Tablet By Mouth Once A Day 11)  Vitamin C Cr 500 Mg Cr-Caps (Ascorbic Acid) .... Take 1 Capsule By Mouth Once A Day 12)  Neurontin 300 Mg Caps (Gabapentin) .... Take 1 Tablet By Mouth Two Times A Day 13)  Allopurinol 100 Mg Tabs (Allopurinol) .... Take 1 Tablet By Mouth Once A Day 14)  Byetta 5 Mcg Pen 5 Mcg/0.83ml Soln (Exenatide) .... One Injection Two Times A Day  Allergies (verified): 1)  ! Lisinopril 2)  ! Codeine  Comments:  Nurse/Medical Assistant: The patient's medication list and allergies were reviewed with the patient and were updated in the Medication and Allergy Lists.  Past History:  Past Medical History: Last updated: 04/26/2009 exertional angina status post patent bypass graft along the greaterstaff in this vein graf tto an obtuse marginal branch correlating with positive Cardiolite study Ejection fraction of 30 to 35%. Ongoing angina symptoms despite increasing nitrates. Ischemic cardiomyopathy. Palpitations. angioedema secondary to ACE inhibitor therapy. Status post Bell palsy. diabetes mellitus. Abdominal aortic aneurysm. Hypertension. History of tobacco use. Peripheral arterial disease with total occlusion of the right SFA and an ABI 0.5 on the right side.  Family History: Reviewed  history from 03/30/2009 and no changes required.  Negative FH of Diabetes, Hypertension, or Coronary Artery Disease  Social History: Reviewed history from 03/30/2009 and no changes required. Tobacco Use - Former.   Review of Systems       The patient complains of chest pain, shortness of breath, dizziness, and anxiety.  The patient denies fatigue, malaise, fever, weight gain/loss, vision loss, decreased hearing, hoarseness, palpitations, prolonged  cough, wheezing, sleep apnea, coughing up blood, abdominal pain, blood in stool, nausea, vomiting, diarrhea, heartburn, incontinence, blood in urine, muscle weakness, joint pain, leg swelling, rash, skin lesions, headache, fainting, depression, enlarged lymph nodes, easy bruising or bleeding, and environmental allergies.    Vital Signs:  Patient profile:   65 year old male Height:      73 inches Weight:      262 pounds Pulse rate:   74 / minute BP sitting:   96 / 61  (left arm) Cuff size:   large  Vitals Entered By: Georgina Peer (June 15, 2010 1:53 PM)  Serial Vital Signs/Assessments:  Time      Position  BP       Pulse  Resp  Temp     By 1:55 PM             93/56    71                    Georgina Peer  Comments: 1:55 PM right arm large cuff By: Georgina Peer    Physical Exam  Additional Exam:  General: Well-developed, well-nourished in no distress head: Normocephalic and atraumatic eyes PERRLA/EOMI intact, conjunctiva and lids normal nose: No deformity or lesions mouth normal dentition, normal posterior pharynx neck: Supple, no JVD.  No masses, thyromegaly or abnormal cervical nodes lungs: Normal breath sounds bilaterally without wheezing.  Normal percussion heart: regular rate and rhythm with normal S1 and S2, no S3 or S4.  PMI is normal.  No pathological murmurs abdomen: Normal bowel sounds, abdomen is soft and nontender without masses, organomegaly or hernias noted.  No hepatosplenomegaly musculoskeletal: Back normal, normal gait muscle strength and tone normal pulsus: unable to palpate pulse in right leg dorsalis pedis and posterior tibial pulse. Pulse was palpable in the left leg Extremities: Trace edema neurologic: Alert and oriented x 3 skin: Intact without lesions or rashes cervical nodes: No significant adenopathy psychologic: Normal affect    Impression & Recommendations:  Problem # 1:  GOUT (ICD-274.9) symptoms have been in remission since the  patient was placed on allopurinol for elevated for uric acid levels. removed for medication list    Colcrys 0.6 Mg Tabs (Colchicine) .Marland Kitchen... Take one tab by mouth x 1, may repeat  one hour later, then take one tab two times a day x 14 days then stop His updated medication list for this problem includes:    Allopurinol 100 Mg Tabs (Allopurinol) .Marland Kitchen... Take 1 tablet by mouth once a day  Problem # 2:  SHORTNESS OF BREATH (ICD-786.05) patient has chronic dyspnea but this appears to be stable. His updated medication list for this problem includes:    Coreg 25 Mg Tabs (Carvedilol) .Marland Kitchen... Take 1 tablet by mouth two times a day  (place on file)    Norvasc 5 Mg Tabs (Amlodipine besylate) .Marland Kitchen... Take 1 tablet by mouth once a day    Furosemide 40 Mg Tabs (Furosemide) .Marland Kitchen... Take 2 tabs every morning    Aspirin 81 Mg Tbec (Aspirin) .Marland Kitchen... Take 1 tablet  by mouth two times a day  Problem # 3:  ATHEROSLERO NATV ART EXTREM W/INTERMIT CLAUDICAT (ICD-440.21) the patient has claudication in the right leg.  He will call us back with a prescription that he received from his primary care physician previously.  We will refill this medication it appears that he was on nonsteroidal  Problem # 4:  AAA (ICD-441.4) abdominal aortic aneurysm is followed by Dr. Tawni Millers.  Problem # 5:  DIABETIC PERIPHERAL NEUROPATHY (ICD-250.60) patient is on Neurontin His updated medication list for this problem includes:    Metformin Hcl 1000 Mg Tabs (Metformin hcl) .Marland Kitchen... Take 1 tablet by mouth two times a day    Glipizide 10 Mg Tabs (Glipizide) .Marland Kitchen... Take 1 tablet by mouth two times a day    Aspirin 81 Mg Tbec (Aspirin) .Marland Kitchen... Take 1 tablet by mouth two times a day    Byetta 5 Mcg Pen 5 Mcg/0.81ml Soln (Exenatide) ..... One injection two times a day  Problem # 6:  CHRONIC SYSTOLIC HEART FAILURE (123456) LV dysfunction is stable.  Ejection fraction has improved.  No indication for defibrillator present time.  I did ask the patient to  discontinue his evening dose of Lasix given his relatively low blood pressure. His updated medication list for this problem includes:    Coreg 25 Mg Tabs (Carvedilol) .Marland Kitchen... Take 1 tablet by mouth two times a day  (place on file)    Imdur 120 Mg Xr24h-tab (Isosorbide mononitrate) .Marland Kitchen... Take 1 1/2 tabs (180mg ) daily    Norvasc 5 Mg Tabs (Amlodipine besylate) .Marland Kitchen... Take 1 tablet by mouth once a day    Furosemide 40 Mg Tabs (Furosemide) .Marland Kitchen... Take 2 tabs every morning    Nitrostat 0.4 Mg Subl (Nitroglycerin) .Marland Kitchen... Dissolve one tablet under tongue for severe chest pain as needed every 5 minutes, not to exceed 3 in 15 min time frame    Aspirin 81 Mg Tbec (Aspirin) .Marland Kitchen... Take 1 tablet by mouth two times a day  Problem # 7:  PALPITATIONS, RECURRENT (ICD-785.1)  the patient has more frequent palpitations.  Clinically there appeared to be PVCs but it's possible that he could have atrial fibrillation and we will apply a cardiac monitor.  If the patient is a fibrillation he would be at high risk for stroke His updated medication list for this problem includes:    Coreg 25 Mg Tabs (Carvedilol) .Marland Kitchen... Take 1 tablet by mouth two times a day  (place on file)    Imdur 120 Mg Xr24h-tab (Isosorbide mononitrate) .Marland Kitchen... Take 1 1/2 tabs (180mg ) daily    Norvasc 5 Mg Tabs (Amlodipine besylate) .Marland Kitchen... Take 1 tablet by mouth once a day    Nitrostat 0.4 Mg Subl (Nitroglycerin) .Marland Kitchen... Dissolve one tablet under tongue for severe chest pain as needed every 5 minutes, not to exceed 3 in 15 min time frame    Aspirin 81 Mg Tbec (Aspirin) .Marland Kitchen... Take 1 tablet by mouth two times a day  Orders: Cardionet/Event Monitor (Cardionet/Event)  Patient Instructions: 1)  Decrease Lasix to 80mg  daily 2)  Establish with Dr. Nevada Crane (downstairs) 3)  Cardionet monitor 4)  Follow up - Friday, March 9 at 10:00 with Dr. Dannielle Burn

## 2010-06-23 NOTE — Progress Notes (Signed)
Summary: CLARIFICATION ON FUROSEMIDE  ---- Converted from flag ---- ---- 09/24/2009 1:08 PM, Terald Sleeper, MD, [facility] wrote: Yes 80 and 40  ---- 09/14/2009 10:59 AM, Georgina Peer wrote: note that patien't bottle for furosemide was .two times a [id number] this patient states that this is what the pharmacy gave him this time when he refilled it and he was taking 80mg  in am and 40mg  in pm. Please clarify if he need to go back to 80 and 40 since thats what we have in chart. ------------------------------  Phone Note Outgoing Call Call back at [facility] Phone [phone]   Call placed by: Georgina Peer,  Sep 27, 2009 3:40 PM Call placed to: Patient Summary of Call: Patient informed of the above.  Initial call taken by: Georgina Peer,  Sep 27, 2009 3:40 PM

## 2010-06-23 NOTE — Miscellaneous (Signed)
Summary: Rehab Report/ CARDIAC REHAB PROGRESS REPORT  Rehab Report/ CARDIAC REHAB PROGRESS REPORT   Imported By: Bartholomew Boards 08/17/2009 10:14:29  _____________________________________________________________________  External Attachment:    Type:   Image     Comment:   External Document

## 2010-06-23 NOTE — Miscellaneous (Signed)
Summary: Rehab Report/ CARDIAC REHAB PROGRESS REPORT  Rehab Report/ CARDIAC REHAB PROGRESS REPORT   Imported By: Bartholomew Boards 07/15/2009 10:19:54  _____________________________________________________________________  External Attachment:    Type:   Image     Comment:   External Document

## 2010-06-23 NOTE — Assessment & Plan Note (Signed)
Summary: 3 month fu will have echo before visit   Visit Type:  Follow-up Referring Provider:  Dr Dannielle Burn Primary Provider:  Monico Blitz, MD   History of Present Illness: the patient's is a 65 year old male with severe coronary artery disease, status post coronary bypass grafting and ischemic cardiomyopathy. He has stable exertional angina on medical therapy. Will catheterization done several months ago he was found to have a long lesion in the vein graft to the obtuse marginal branch which was not amenable to PCI.  He has severe peripheral vascular disease and is limited markedly with physical activity by claudication on minimal exertion particularly the right leg. He underwent both a lower extremity arteriogram and CTA and lower sugars run off. He spell followed by vascular surgery. He has an abdominal aortic aneurysm. He likely will be scheduled for surgery later this year. He reports no resting claudication.  The patient has angina on mild to moderate exertion. He limits his activities. His pattern is unstable. He denies any palpitations or syncope.  The patient also reports symptoms of gout in his right wrist. His wrist is very tender to touch. Unfortunately is not on any treatment. 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 Take 1 Tablet By Mouth Two Times A Day  (Place On File) 2)  Imdur 120 Mg Xr24h-Tab (Isosorbide Mononitrate) .... Take 1 1/2 Tabs (180mg ) Daily 3)  Norvasc 5 Mg Tabs (Amlodipine Besylate) .... Take 1 Tablet By Mouth Once A Day 4)  Furosemide 40 Mg Tabs (Furosemide) .... Take Two Tablet By Mouth in Am and One in Pm 5)  Metformin Hcl 1000 Mg Tabs (Metformin Hcl) .... Take 1 Tablet By Mouth Two Times A Day 6)  Glipizide 10 Mg Tabs (Glipizide) .... Take 1 Tablet By Mouth Two Times A  Day 7)  Simvastatin 10 Mg Tabs (Simvastatin) .... Take One Tablet By Mouth Daily At Bedtime 8)  Nitrostat 0.4 Mg Subl (Nitroglycerin) .... Dissolve One Tablet Under Tongue For Severe Chest Pain As Needed Every 5 Minutes, Not To Exceed 3 in 15 Min Time Frame 9)  Aspirin 81 Mg Tbec (Aspirin) .... Take 1 Tablet By Mouth Two Times A Day 10)  Tart Cherry Advanced  Caps (Misc Natural Products) .... Take 1 Tablet By Mouth Once A Day 11)  Vitamin C Cr 500 Mg Cr-Caps (Ascorbic Acid) .... Take 1 Capsule By Mouth Once A Day 12)  Neurontin 300 Mg Caps (Gabapentin) .... Take 1 Tablet By Mouth Two Times A Day 13)  Step N Rest Walker  Misc (Misc. Devices) .... Use As Directed 14)  Colcrys 0.6 Mg Tabs (Colchicine) .... Take One Tab By Mouth X 1, May Repeat  One Hour Later, Then Take One Tab Two Times A Day X 14 Days Then Stop 15)  Allopurinol 100 Mg Tabs (Allopurinol) .... Take 1 Tablet By Mouth Once A Day  Allergies: 1)  ! Lisinopril 2)  ! Codeine  Comments:  Nurse/Medical Assistant: The patient's medication list and allergies were reviewed with the patient and were updated in the Medication and Allergy Lists.  Past History:  Past Medical History: Last updated: 04/26/2009 exertional angina status post patent bypass graft along the greaterstaff in this vein graf tto an obtuse marginal branch correlating with positive Cardiolite study Ejection fraction of  30 to 35%. Ongoing angina symptoms despite increasing nitrates. Ischemic cardiomyopathy. Palpitations. angioedema secondary to ACE inhibitor therapy. Status post Bell palsy. diabetes mellitus. Abdominal aortic aneurysm. Hypertension. History of tobacco use. Peripheral arterial disease with total occlusion of the right SFA and an ABI 0.5 on the right side.  Family History: Last updated: 03/30/2009 noncontributory  Social History: Last updated: 03/30/2009 Tobacco Use - Former.   Risk Factors: Smoking Status: quit (12/14/2009)  Review  of Systems       The patient complains of chest pain and muscle weakness.  The patient denies fatigue, malaise, fever, weight gain/loss, vision loss, decreased hearing, hoarseness, palpitations, shortness of breath, prolonged cough, wheezing, sleep apnea, coughing up blood, abdominal pain, blood in stool, nausea, vomiting, diarrhea, heartburn, incontinence, blood in urine, joint pain, leg swelling, rash, skin lesions, headache, fainting, dizziness, depression, anxiety, enlarged lymph nodes, easy bruising or bleeding, and environmental allergies.         claudication  Vital Signs:  Patient profile:   65 year old male Height:      73 inches Weight:      269 pounds Pulse rate:   73 / minute BP sitting:   108 / 68  (left arm) Cuff size:   large  Vitals Entered By: Georgina Peer (December 14, 2009 1:43 PM)  Physical Exam  Additional Exam:  General: Well-developed, well-nourished in no distress head: Normocephalic and atraumatic eyes PERRLA/EOMI intact, conjunctiva and lids normal nose: No deformity or lesions mouth normal dentition, normal posterior pharynx neck: Supple, no JVD.  No masses, thyromegaly or abnormal cervical nodes lungs: Normal breath sounds bilaterally without wheezing.  Normal percussion heart: regular rate and rhythm with normal S1 and S2, no S3 or S4.  PMI is normal.  No pathological murmurs abdomen: Normal bowel sounds, abdomen is soft and nontender without masses, organomegaly or hernias noted.  No hepatosplenomegaly musculoskeletal: Back normal, normal gait muscle strength and tone normal pulsus: unable to palpate pulse in right leg dorsalis pedis and posterior tibial pulse. Pulse was palpable in the left leg Extremities: Trace edema neurologic: Alert and oriented x 3 skin: Intact without lesions or rashes cervical nodes: No significant adenopathy psychologic: Normal affect    Echocardiogram  Procedure date:  11/30/2009  Findings:      ejection fraction  45-50%. Multiple segmental wall motion abnormalities. Mildly dilated left atrium.  ABI's  Procedure date:  10/08/2009  Findings:      patent arteries and in the left groin without pseudoaneurysm or AV fistula formation.  Arteriogram-Lower Extremity  Procedure date:  11/01/2009  Findings:      CT angiogram of the lower extremities Stable 5.3 cm infrarenal abdominal aortic aneurysm 2.9 cm aneurysmal dilatation of the proximal right common iliac artery Long segment proximal right popliteal artery occlusion, reconstituted knee disease three-vessel runoff. Stable proximal left popliteal artery aneurysm Right nephrolithiasis Sigmoid diverticulosis  Arteriogram-Lower Extremity  Procedure date:  10/06/2009  Findings:      arteriogram lower extremities Infrarenal abdominal aortic aneurysm Total occlusion of the right superficial femoral artery with reconstitution below knee popliteal  Impression & Recommendations:  Problem # 1:  ATHEROSLERO NATV ART EXTREM W/INTERMIT CLAUDICAT (ICD-440.21) the patient is followed by vascular surgery. He'll likely undergo surgery later this year.  Problem # 2:  AAA (ICD-441.4) ongoing surveillance with vascular surgery  Problem # 3:  DIABETIC PERIPHERAL NEUROPATHY (ICD-250.60) improved with Neurontin His updated medication list for this problem includes:    Metformin Hcl 1000 Mg Tabs (Metformin  hcl) ..... Take 1 tablet by mouth two times a day    Glipizide 10 Mg Tabs (Glipizide) .Marland Kitchen... Take 1 tablet by mouth two times a day    Aspirin 81 Mg Tbec (Aspirin) .Marland Kitchen... Take 1 tablet by mouth two times a day  Problem # 4:  CHRONIC SYSTOLIC HEART FAILURE (123456) no evidence of heart failure symptoms. Ejection fraction remains stable. If anything it has improved and the patient is not in need of an ICD. His updated medication list for this problem includes:    Coreg 25 Mg Tabs (Carvedilol) .Marland Kitchen... Take 1 tablet by mouth two times a day  (place on  file)    Imdur 120 Mg Xr24h-tab (Isosorbide mononitrate) .Marland Kitchen... Take 1 1/2 tabs (180mg ) daily    Norvasc 5 Mg Tabs (Amlodipine besylate) .Marland Kitchen... Take 1 tablet by mouth once a day    Furosemide 40 Mg Tabs (Furosemide) .Marland Kitchen... Take two tablet by mouth in am and one in pm    Nitrostat 0.4 Mg Subl (Nitroglycerin) .Marland Kitchen... Dissolve one tablet under tongue for severe chest pain as needed every 5 minutes, not to exceed 3 in 15 min time frame    Aspirin 81 Mg Tbec (Aspirin) .Marland Kitchen... Take 1 tablet by mouth two times a day  Problem # 5:  GOUT (ICD-274.9) the patient has an acute gouty attack and a given a prescription for colchicine to be taken for the next 14 days. The skin then be discontinued and he can start on allopurinol 100 mg a day I asked him to further discuss this with his primary care physician to up titrate the dose to a uric acid level of less than 6.5. His updated medication list for this problem includes:    Colcrys 0.6 Mg Tabs (Colchicine) .Marland Kitchen... Take one tab by mouth x 1, may repeat  one hour later, then take one tab two times a day x 14 days then stop    Allopurinol 100 Mg Tabs (Allopurinol) .Marland Kitchen... Take 1 tablet by mouth once a day  Patient Instructions: 1)  Colcrys 0.6mg  x 1 dose, may repeat one hour later.  Then take 0.6mg  two times a day x 14 days, then stop. 2)  Afte above, start Allopurinol 100mg  daily.  Will need to discuss with PMD for any increases that need to be made and labs that may be needed for this.  (200mg  - 600mg ) 3)  Follow up in  6 months Prescriptions: ALLOPURINOL 100 MG TABS (ALLOPURINOL) Take 1 tablet by mouth once a day  #30 x 1   Entered by:   Lovina Reach, LPN   Authorized by:   Terald Sleeper, MD, [facility]   Signed by:   Lovina Reach, LPN on [id number]   Method used:   Electronically to        [facility] (retail)       [address], [place]  [address]       Ph: [phone]       Fax: [phone]   RxID:   [id number] COLCRYS  0.6 MG TABS (COLCHICINE) take one tab by mouth x 1, may repeat  one hour later, then take one tab two times a day x 14 days then stop  #30 x 0   Entered by:   Lovina Reach, LPN   Authorized by:   Terald Sleeper, MD, [facility]   Signed by:   Lovina Reach, LPN  on 12/14/2009   Method used:   Electronically to        [facility] (retail)       [address], [place]  [address]       Ph: [phone]       Fax: [phone]   RxID:   [id number]

## 2010-06-29 NOTE — Progress Notes (Signed)
Summary: refill Indomethacin  ????  Phone Note Other Incoming   Caller: voicemail message Summary of Call: MD told patient to call back with name of med he could not think of day of visit for pain.  Indomethacin 25mg  cap.  Said [facility] would refill for him Please advise. Lovina Reach, LPN  January 26, [id number] 6:35 PM   Follow-up for Phone Call        okay torefill indomethacin at 25 mg p.o. up to 3 times a day but no more. Follow-up by: Terald Sleeper, MD, [facility],  June 20, 2010 1:57 PM  Additional Follow-up for Phone Call Additional follow up Details #1::        Patient notified via answering machine.  Rx sent to pharm. Additional Follow-up by: Lovina Reach, LPN,  February  1, [id number] 2:37 PM    New/Updated Medications: INDOMETHACIN 25 MG CAPS (INDOMETHACIN) may take one tab up to three times a day as needed Prescriptions: INDOMETHACIN 25 MG CAPS (INDOMETHACIN) may take one tab up to three times a day as needed  #90 x 0   Entered by:   Lovina Reach, LPN   Authorized by:   Terald Sleeper, MD, [facility]   Signed by:   Lovina Reach, LPN on [id number]   Method used:   Electronically to        [facility] (retail)       [address], [place]  [address]       Ph: [phone]       Fax: [phone]   RxID:   [id number]

## 2010-07-07 NOTE — Letter (Signed)
Summary: [facility] Visit   [facility] Visit   Imported By: Sallee Provencal 06/23/2010 12:23:56  _____________________________________________________________________  External Attachment:    Type:   Image     Comment:   External Document

## 2010-07-22 ENCOUNTER — Encounter: Payer: Self-pay | Admitting: Cardiology

## 2010-07-29 ENCOUNTER — Ambulatory Visit (INDEPENDENT_AMBULATORY_CARE_PROVIDER_SITE_OTHER): Payer: Medicare Other | Admitting: Cardiology

## 2010-07-29 ENCOUNTER — Encounter: Payer: Self-pay | Admitting: Cardiology

## 2010-07-29 DIAGNOSIS — I2510 Atherosclerotic heart disease of native coronary artery without angina pectoris: Secondary | ICD-10-CM

## 2010-07-29 DIAGNOSIS — R002 Palpitations: Secondary | ICD-10-CM

## 2010-08-02 NOTE — Progress Notes (Signed)
Summary: Office Visit-VV   Office Visit-VV   Imported By: Delfino Lovett 07/29/2010 08:42:29  _____________________________________________________________________  External Attachment:    Type:   Image     Comment:   External Document

## 2010-08-04 ENCOUNTER — Encounter: Payer: Self-pay | Admitting: Cardiology

## 2010-08-08 LAB — BASIC METABOLIC PANEL
BUN: 16 mg/dL (ref 6–23)
Calcium: 9.1 mg/dL (ref 8.4–10.5)
Chloride: 100 mEq/L (ref 96–112)
GFR calc non Af Amer: 53 mL/min — ABNORMAL LOW (ref >60)
Potassium: 4.4 mEq/L (ref 3.5–5.1)
Sodium: 140 mEq/L (ref 135–145)

## 2010-08-08 LAB — CBC
HCT: 40.6 % (ref 39.0–52.0)
Hemoglobin: 13.7 g/dL (ref 13.0–17.0)
MCV: 97.2 fL (ref 78.0–100.0)
RBC: 4.18 MIL/uL — ABNORMAL LOW (ref 4.22–5.81)
RDW: 13.7 % (ref 11.5–15.5)
WBC: 14.3 10*3/uL — ABNORMAL HIGH (ref 4.0–10.5)

## 2010-08-08 LAB — GLUCOSE, CAPILLARY
Glucose-Capillary: 136 mg/dL — ABNORMAL HIGH (ref 70–99)
Glucose-Capillary: 163 mg/dL — ABNORMAL HIGH (ref 70–99)

## 2010-08-08 LAB — PROTIME-INR
INR: 0.87 (ref 0.00–1.49)
Prothrombin Time: 11.8 seconds (ref 11.6–15.2)

## 2010-08-08 LAB — APTT: aPTT: 29 seconds (ref 24–37)

## 2010-08-09 NOTE — Assessment & Plan Note (Signed)
Summary: 1 month f/u - [facility]   Visit Type:  Follow-up Referring Provider:  Dr Dannielle Burn Primary Provider:  Monico Blitz, MD   History of Present Illness: The patient is a 65 year old male with a history of coronary artery bypass grafting, peripheral vascular disease is also abdominal aortic aneurysm followed by vascular surgery.  He has an ischemic cardiomyopathy.  His ejection fraction has improved and is now 45 to 50%.  The patient has a history of claudication in the right leg. He also has chronic stable angina secondary to a long stenosis and a vein graft.  Is on high dose isosorbide mononitrate.  During the last office visit he did not require any extra nitroglycerin. His predominant complaint several weeks ago was palpitation that occurred almost daily.  He felt weak when it occurred but there was no shortness of breath or PND.  Cardiac monitor was ordered. The patient's angina symptoms have actually improved.  He uses nitroglycerin before strenuous activity and this has helped quite a bit.  He does not have angina as frequently anymore.  He is in NYHA class IIb.  The patient is concerned about low testosterone but I've asked him to discuss this with his future primary care physician.  From a cardiac standpoint he is overall doing better. I reviewed with the patient his cardiac monitor.  He has documented PVCs.  There is no evidence of nonsustained ventricular tachycardia.  Clinically his frequency of palpitations has actually improved.   Preventive Screening-Counseling & Management  Alcohol-Tobacco     Smoking Status: quit     Year Quit: 2009  Current Medications (verified): 1)  Coreg 25 Mg Tabs (Carvedilol) .... Take 1 Tablet By Mouth Two Times A Day  (Place On File) 2)  Imdur 120 Mg Xr24h-Tab (Isosorbide Mononitrate) .... Take 1 1/2 Tabs (180mg ) Daily 3)  Norvasc 5 Mg Tabs (Amlodipine Besylate) .... Take 1 Tablet By Mouth Once A Day 4)  Furosemide 40 Mg Tabs (Furosemide) .... Take  2 Tabs Every Morning 5)  Metformin Hcl 1000 Mg Tabs (Metformin Hcl) .... Take 1 Tablet By Mouth Two Times A Day 6)  Glipizide 10 Mg Tabs (Glipizide) .... Take 1 Tablet By Mouth Two Times A Day 7)  Simvastatin 10 Mg Tabs (Simvastatin) .... Take One Tablet By Mouth Daily At Bedtime 8)  Nitrostat 0.4 Mg Subl (Nitroglycerin) .... Dissolve One Tablet Under Tongue For Severe Chest Pain As Needed Every 5 Minutes, Not To Exceed 3 in 15 Min Time Frame 9)  Aspirin 81 Mg Tbec (Aspirin) .... Take 1 Tablet By Mouth Two Times A Day 10)  Tart Cherry Advanced  Caps (Misc Natural Products) .... Take 1 Tablet By Mouth Once A Day 11)  Vitamin C Cr 500 Mg Cr-Caps (Ascorbic Acid) .... Take 1 Capsule By Mouth Once A Day 12)  Neurontin 300 Mg Caps (Gabapentin) .... Take 1 Tablet By Mouth Two Times A Day 13)  Allopurinol 100 Mg Tabs (Allopurinol) .... Take 1 Tablet By Mouth Once A Day 14)  Byetta 5 Mcg Pen 5 Mcg/0.23ml Soln (Exenatide) .... One Injection Two Times A Day 15)  Indomethacin 25 Mg Caps (Indomethacin) .... May Take One Tab Up To Three Times A Day As Needed 16)  Onetouch Ultra Mini W/device Kit (Blood Glucose Monitoring Suppl) .... Use As Directed  Allergies: 1)  ! Lisinopril 2)  ! Codeine  Comments:  Nurse/Medical Assistant: The patient's medications were reviewed with the patient and were updated in the Medication List. Pt brought  a list of medications to office visit.  Gurney Maxin, RN, BSN (July 29, 2010 10:16 AM)  Past History:  Past Medical History: Last updated: 04/26/2009 exertional angina status post patent bypass graft along the greaterstaff in this vein graf tto an obtuse marginal branch correlating with positive Cardiolite study Ejection fraction of 30 to 35%. Ongoing angina symptoms despite increasing nitrates. Ischemic cardiomyopathy. Palpitations. angioedema secondary to ACE inhibitor therapy. Status post Bell palsy. diabetes mellitus. Abdominal aortic  aneurysm. Hypertension. History of tobacco use. Peripheral arterial disease with total occlusion of the right SFA and an ABI 0.5 on the right side.  Family History: Last updated: 06/15/2010  Negative FH of Diabetes, Hypertension, or Coronary Artery Disease  Social History: Last updated: 03/30/2009 Tobacco Use - Former.   Risk Factors: Smoking Status: quit (07/29/2010)  Review of Systems  The patient denies fatigue, malaise, fever, weight gain/loss, vision loss, decreased hearing, hoarseness, chest pain, palpitations, shortness of breath, prolonged cough, wheezing, sleep apnea, coughing up blood, abdominal pain, blood in stool, nausea, vomiting, diarrhea, heartburn, incontinence, blood in urine, muscle weakness, joint pain, leg swelling, rash, skin lesions, headache, fainting, dizziness, depression, anxiety, enlarged lymph nodes, easy bruising or bleeding, and environmental allergies.    Vital Signs:  Patient profile:   65 year old male Height:      73 inches Weight:      260.50 pounds BMI:     34.49 Pulse rate:   69 / minute BP sitting:   126 / 69  (left arm) Cuff size:   large  Vitals Entered By: Gurney Maxin, RN, BSN (July 29, 2010 10:13 AM)  Nutrition Counseling: Patient's BMI is greater than 25 and therefore counseled on weight management options. Comments Pt states he is "doing better than he thought."   Physical Exam  Additional Exam:  General: Well-developed, well-nourished in no distress head: Normocephalic and atraumatic eyes PERRLA/EOMI intact, conjunctiva and lids normal nose: No deformity or lesions mouth normal dentition, normal posterior pharynx neck: Supple, no JVD.  No masses, thyromegaly or abnormal cervical nodes lungs: Normal breath sounds bilaterally without wheezing.  Normal percussion heart: regular rate and rhythm with normal S1 and S2, no S3 or S4.  PMI is normal.  No pathological murmurs abdomen: Normal bowel sounds, abdomen is soft and  nontender without masses, organomegaly or hernias noted.  No hepatosplenomegaly musculoskeletal: Back normal, normal gait muscle strength and tone normal pulsus: unable to palpate pulse in right leg dorsalis pedis and posterior tibial pulse. Pulse was palpable in the left leg Extremities: Trace edema neurologic: Alert and oriented x 3 skin: Intact without lesions or rashes cervical nodes: No significant adenopathy psychologic: Normal affect    Impression & Recommendations:  Problem # 1:  CORONARY ARTERY BYPASS GRAFT, HX OF (ICD-V45.81) coronary artery disease: Status post coronary bypass grafting.  Chronic stable angina LV dysfunction ejection fraction 45 to 50%: No heart failure symptoms no further adjustments in medications  Problem # 2:  ANEURYSM OF ARTERY OF LOWER EXTREMITY (ICD-442.3) peripheral vascular disease: Right leg claudication followed by Dr. Burt Knack.  Have asked patient to continue on a walking program.  Problem # 3:  AAA (ICD-441.4) abdominal aortic aneurysm : Followed by vascular surgery.  This is also followed in [place].  Problem # 4:  PALPITATIONS, RECURRENT (ICD-785.1) Palpitations: Status post cardiac monitor.  Documented PVCs but no evidence of nonsustained ventricular tachycardia.  Clinically symptoms have improved.  Continue medical therapy. His updated medication list for this problem includes:  Coreg 25 Mg Tabs (Carvedilol) .Marland Kitchen... Take 1 tablet by mouth two times a day  (place on file)    Imdur 120 Mg Xr24h-tab (Isosorbide mononitrate) .Marland Kitchen... Take 1 1/2 tabs (180mg ) daily    Norvasc 5 Mg Tabs (Amlodipine besylate) .Marland Kitchen... Take 1 tablet by mouth once a day    Nitrostat 0.4 Mg Subl (Nitroglycerin) .Marland Kitchen... Dissolve one tablet under tongue for severe chest pain as needed every 5 minutes, not to exceed 3 in 15 min time frame    Aspirin 81 Mg Tbec (Aspirin) .Marland Kitchen... Take 1 tablet by mouth two times a day  Patient Instructions: 1)  Your physician recommends that you  continue on your current medications as directed. Please refer to the Current Medication list given to you today. 2)  Follow up in  6 months Prescriptions: ONETOUCH ULTRA MINI W/DEVICE KIT (BLOOD GLUCOSE MONITORING SUPPL) use as directed  #1 x 1   Entered by:   Lovina Reach, LPN   Authorized by:   Terald Sleeper, MD, [facility]   Signed by:   Lovina Reach, LPN on [id number]   Method used:   Print then Give to Patient   RxID:   [id number]

## 2010-08-09 NOTE — Medication Information (Signed)
Summary: RX Folder/ FAXED [facility]  [id number] Folder/ FAXED [facility]   Imported By: Bartholomew Boards 08/04/2010 16:35:12  _____________________________________________________________________  External Attachment:    Type:   Image     Comment:   External Document

## 2010-08-09 NOTE — Procedures (Signed)
Summary: Holter and Event/  [facility] END OF SERVICE SUMMARY REPORT  Holter and Event/  [facility] END OF SERVICE SUMMARY REPORT   Imported By: Bartholomew Boards 08/01/2010 15:42:33  _____________________________________________________________________  External Attachment:    Type:   Image     Comment:   External Document

## 2010-10-04 NOTE — Assessment & Plan Note (Signed)
[facility]                          [facility] OFFICE NOTE   NAME:[name], [name]                  MRN:          [id number]  DATE:09/19/2007                            DOB:          13-Mar-1946    REFERRING PHYSICIAN:  Monico Blitz, MD   HISTORY OF PRESENT ILLNESS:  The patient is a pleasant 65 year old male  with a history of coronary artery disease.  The patient is status post  cardiac catheterization.  The patient had prior bypass grafting.  Grafts  are patent short of a long lesion and saphenous vein graft to obtuse  marginal branch.  He also had a positive Cardiolite stress study.  He  did present with exertional angina.  The patient states that after  adjustment in his medication and the addition of Imdur his angina has  become less frequent but still present.  He takes nitroglycerin which  resolves the pain.  He also reports occasional palpitations in the  morning.   MEDICATIONS:  1. Aspirin 81 mg p.o. b.i.d.  2. Lisinopril/hydrochlorothiazide 20/25 mg p.o. daily.  3. Glipizide 5 mg p.o. daily.  4. Metformin 5 mg 3 tablets in the morning and 1 in the evening.  5. Carvedilol 3.125 p.o. b.i.d.  6. Prednisone.  7. Valtrex.  8. Isosorbide mononitrate 30 mg p.o. daily.  9. Lovastatin 40 mg p.o. daily at bedtime.   PHYSICAL EXAMINATION:  VITAL SIGNS:  Blood pressure 114/67, heart rate  82, weight 265 pounds.  NECK:  Normal carotid stroke, no carotid bruits.  LUNGS:  Clear breath sounds bilaterally.  HEART:  Regular rate and rhythm.  Normal S1, S2, no murmurs, rubs or  gallops.  ABDOMEN:  Soft, nontender.  No rebound or guarding.  Good bowel sounds.  EXTREMITIES:  No cyanosis, clubbing or edema.  NEUROLOGICAL:  The patient is alert, oriented and grossly nonfocal.   PROBLEMS:  1. Exertional angina.      a.     Patent bypass graft with a long lesion in the saphenous vein       graft to obtuse marginal branch.      b.     Positive  Cardiolite stress study.      c.     Ejection fraction of 30-35%.  2. Ischemic cardiomyopathy.  3. Status post Bell's palsy.  4. Insulin dependent diabetes mellitus.  5. Hypertension.  6. Dyslipidemia.  7. History of tobacco use, discontinued.   PLAN:  1. The patient will need further up titration of antianginal drug      therapy.  This is somewhat limited due to a borderline blood      pressure.  Will increase his carvedilol to 6.25 mg p.o. b.i.d. but      cut back on his lisinopril/hydrochlorothiazide to a half a tablet      p.o. daily.  2. The patient was also given a prescription for blood pressure cuff      with arm cuff for more accurate blood pressure readings.  3. The patient will require close monitoring of his ejection fraction      and if less than 30%  he will require EP evaluation.  4. We also made the patient aware that if he has recurrent      palpitations on higher dose carvedilol we will need a CardioNet      monitor to rule out any significant ventricular arrhythmias.  The      patient will follow up with [place] in a couple of months.     Ernestine Mcmurray, MD,FACC  Electronically Signed    GED/MedQ  DD: 09/19/2007  DT: 09/19/2007  Job #: [id number]   cc:   Monico Blitz, MD

## 2010-10-04 NOTE — Assessment & Plan Note (Signed)
OFFICE VISIT   [name], [name]  DOB:  02-02-1946                                       05/29/2008  [id number]   The patient presents today for follow-up of abdominal aortic aneurysm.  He is a 65 year old gentleman who had initial aneurysm discovery with a  CAT scan at [facility] on October 30, 2007, at that time his  aneurysm was 4.5 cm.  I had seen him for further evaluation and  recommended that we proceed with serial ultrasound and follow up to rule  out any increase in size.  He reports no new medical difficulties since  that time.  Specifically, he has no cardiac difficulties.  He does have  an umbilical hernia which is causing him discomfort and is enlarging in  size.  He denies any symptoms referable to his aneurysm.   PHYSICAL EXAM:  Vital Signs:  His blood pressure is 147/88, pulse 86,  respirations 18.  His radial and femoral pulses are 2+.  He has marked  obesity and I do not feel an aneurysm.   He underwent noninvasive vascular laboratory studies in our office today  and this reveals no change with maximal diameter 4.5 cm.  I discussed  this with the patient.  I explained that he now has had two studies  separated by approximately 6 months showing no change; I feel  comfortable dropping back to yearly ultrasound.  We will see him on an  annual basis and will discuss repair should he develop any increasing  size.  Also, I discussed symptoms of leaking aneurysm to him and he  knows to present immediately to the emergency room should this occur.   Rosetta Posner, M.D.  Electronically Signed   TFE/MEDQ  D:  05/29/2008  T:  06/01/2008  Job:  [id number]   cc:   Monico Blitz, MD  Ernestine Mcmurray, MD,FACC

## 2010-10-04 NOTE — Assessment & Plan Note (Signed)
OFFICE VISIT   [name], [name]  DOB:  December 12, 1945                                       05/10/2010  [id number]   The patient presents today for continued followup of his infrarenal  abdominal aortic aneurysm and left popliteal artery aneurysm.  He  reports that he has been slowly less active and he reports occasional  feeling of his legs giving way on him and has actually fallen.  This  does not appear to be arterial insufficiency since he does have some  stable claudication but this is unrelated.  He also reports feeling of  irregular heart rate which concerns him as well.  He is to see Dr.  Dannielle Burn in January and will discuss this with him at this time.  He is  not having any specific chest pain.   PHYSICAL EXAMINATION:  General:  A well-developed, well-nourished white  male, moderately obese and appearing his stated age in no acute  distress.  Vital signs:  Blood pressure is 149/65, pulse 66,  respirations 18, oxygen saturation is 98% on room air.  HEENT:  Normal.  Abdomen:  Obese.  I do not feel an aneurysm.  He has 2+ femoral and he  does have a prominent left popliteal pulse.  I do not feel a right  popliteal pulse.  He does have palpable pulses on the left.   He underwent noninvasive vascular laboratory studies today which I  reviewed with the patient.  This shows a maximal diameter of his aorta  predicted at 4.7 cm which is no significant change from his prior  ultrasound.  He did have a CT scan in June of 2011 showing a 5 cm  aneurysm.  Ultrasound of his left popliteal and distal superficial  femoral artery is maximal diameter of 2.6 as compared to 2.7 on his  prior study 6 months ago.  I again discussed symptoms of leaking  aneurysm and popliteal artery occlusion.  He will notify me should this  occur.  Otherwise we have recommended serial 6 month followup to rule  out any progression.     Rosetta Posner, M.D.  Electronically  Signed   TFE/MEDQ  D:  05/10/2010  T:  05/10/2010  Job:  [id number]   cc:   Ernestine Mcmurray, MD,FACC

## 2010-10-04 NOTE — Cardiovascular Report (Signed)
[name], [name] NO.:  [id number]   MEDICAL RECORD NO.:  [id number]          PATIENT TYPE:  OIB   LOCATION:  2020                         FACILITY:  [facility]   PHYSICIAN:  Loretha Brasil. Lia Foyer, MD, FACCDATE OF BIRTH:  February 19, 1946   DATE OF PROCEDURE:  09/11/2007  DATE OF DISCHARGE:  09/12/2007                            CARDIAC CATHETERIZATION   INDICATIONS:  Mr. Andersen is set down for repeat cardiac  catheterization.  He was seen by Dr. Dannielle Burn, and has an abnormal  Myoview.  He has exertional chest burning.  He also has developed a Bell  palsy.  He was referred back down for repeat catheterization.   PROCEDURE:  1. Left heart catheterization.  2. Selective coronary arteriography.  3. Selective left ventriculography.  4. Saphenous vein graft angiography.  5. Selective left internal mammary angiography and the date of study      September 11, 2007.   DESCRIPTION OF PROCEDURE:  The procedure was performed from the femoral  artery without complication.  He tolerated the procedure.  He was taken  to the holding area in satisfactory clinical condition.  Dr. Burt Knack and  I subsequently reviewed the films.  There were no complications.   HEMODYNAMIC DATA:  1. Central aortic pressure 151/86, mean 111.  2. Left ventricular pressure 147/25.   ANGIOGRAPHIC DATA:  1. The left main coronary is a large-caliber vessel and appears to      remain intact.  2. The left anterior descending artery is totally occluded.  3. There is a modest ramus intermedius vessel, and this bifurcates      distally, it has mild luminal irregularity, but no critical      obstruction.  4. The circumflex marginal demonstrates total occlusion of a marginal      branch with late angiographic filling, and this appears to be      relatively unfavorable for percutaneous intervention.  There is a      long area of segmental occlusion, but is not seen, and there is      retrograde filling of a vein graft  that likely has occluded.  The      AV circumflex itself is a large-caliber vessel that courses to the      posterolateral segment and appears to supply collaterals to the      distal right circulation.  The AV circumflex probably may have some      30-40% narrowing proximally, but it does not appear to be high-      grade.  5. The right coronary artery is totally occluded.  6. The saphenous vein graft to the obtuse marginal, a previously      patent is now totally occluded.  7. There is a Y-graft that appears to go to a intermediate branch and      diagonal and this vein graft appears to be patent, and there does      not appear to be significant narrowing.  8. There is a small vein graft to an acute marginal branch, and it      appears to be patent as well.  It also  supplies collaterals to the      distal right circulation as does the AV circumflex.  9. The internal mammary to the LAD appears to be widely patent, and      this appears to be smooth down throughout the LAD.  There appears      to be collateralization of the distal right circulation from this.      No significant obstruction in the mammary is noted.   VENTRICULOGRAPHY:  The RAO projection does reveal motion of the  anteroapical and distal inferior segment.  The inferobasal segment  appears to be akinetic, and this corresponds to the distal right  coronary circulation.   DISPOSITION:  There is moderate reduction in left ventricular function,  and an inferior wall motion abnormality.  The inferior wall does appear  to be relatively akinetic.  The vein graft to the diagonal intermediate  is intact, as is the large mammary and the distal right is fairly well  collateralized and may be infarcted nonetheless.  The only change  appears to be in the circumflex distribution with a marginal is  jeopardized.  It is not a very favorable vessel for percutaneous  intervention Dr. Burt Knack and I have reviewed the films, and there is  a  long total occlusion that does not appear to be favorable for  intervention.  We would favor medical treatment with early follow up and  we will add nitrates to the regimen.  He will follow up with Dr. Dannielle Burn.      Loretha Brasil. Lia Foyer, MD, [facility]  Electronically Signed     TDS/MEDQ  D:  10/26/2007  T:  10/27/2007  Job:  [id number]   cc:   Ernestine Mcmurray, MD,FACC

## 2010-10-04 NOTE — Assessment & Plan Note (Signed)
OFFICE VISIT   [name], [name]  DOB:  Aug 23, 1945                                       04/28/2009  [id number]   The patient presents today for continued discussion regarding his  abdominal aortic aneurysm.  He is well-known to me from a prior  evaluation followup of this.  He had had a 4.5 cm aneurysm that we were  following with serial ultrasound exams.  He has undergone recent re-  evaluation for leg pain.  He reports that this occurs in both legs  equally.  He does have pain and numbness which is severe and limits his  walking.  This occurs in his hips, thighs, down into his legs equally  bilaterally.  He has had evaluation to include MR of the spine which I  do not have for evaluation but reports that this was negative.  He had  seen Dr. Sherren Mocha to rule out arterial insufficiency as a cause of  this.  He underwent a CT angiogram which I have and reviewed.  This did  show predicted size of his aneurysm up to 5.4 cm compared to his old  study of 4.5 cm.  His CT angiogram also showed that he has an occlusion  of his right superficial femoral artery and also showed that he has a  2.8 cm left popliteal artery aneurysm.  He does have reconstitution with  good runoff on the right leg.  He is quite frustrated in not having an  answer for the cause of his lower extremity discomfort, which is  severely limiting to him.  He reports this occurs continually with  walking and can also be present with rest.   REVIEW OF SYSTEMS:  Documented in his chart.  He does have a prior  history of coronary artery bypass grafting, history of non-insulin-  dependent diabetes, elevated cholesterol and hypertension.  He does not  smoke having quit in 2009.  The review of systems is multiply positive  and documented in his chart.   PHYSICAL EXAM:  He is obese.  I do not feel an aneurysm.  He has 2+  radial pulses.  He has 2+ femoral pulses bilaterally.  He has a  3+ left  popliteal and left dorsalis pedis pulses.  He has absent right popliteal  and dorsalis pedis pulses.  Carotid arteries are without bruits  bilaterally.  He is grossly intact neurologically.  Pupils are equal,  round and reactive to light.  His extraocular movements are intact.  He  has no JVD or lymphadenopathy in his neck.  His lungs are clear  bilaterally.  Heart reveals regular rate and rhythm without murmurs.   I reviewed his CT scan and discussed this at length with the patient.  I  also reviewed records as provided by Dr. Burt Knack.  This includes a normal  ankle arm index on the left and 0.5 on the right.  On reviewing his CT  scan the interpretation is of a 5.4 cm aneurysm.  On his coronal and  sagittal views he does have some tortuosity of his aorta and in looking  in the axis of the aorta itself I do feel that his aneurysm is indeed  less than 5 cm in diameter.  I feel that the axial images show 5.4 cm  since this is cut on a bias.  I attempted to explain this to the  patient.  Nonetheless, I do not feel that he would require operative  treatment at this time.  With his anatomy he in all likelihood would be  a candidate for stent grafting but I feel that he has not had any  significant change in size.  I have compared his new with his old CT  scan.  I agree with Dr. Burt Knack that it seems as though his right leg  superficial femoral artery occlusion is not causing him any symptoms  since he is unable to walk due to some other cause.  The patient is very  frustrated at not being able to tell why he cannot walk.  I explained  that I do not see any arterial reasons for this.  I did explain symptoms  of leaking aneurysm should they occur and also discussed the symptoms of  popliteal artery occlusion should it occur.  With a 2.8 cm popliteal  artery aneurysm I would recommend evaluation of this with CT angiogram  in 6 months as well.  He will see me again at that time with CT  scan  obtained at [place], M.D.  Electronically Signed   TFE/MEDQ  D:  04/28/2009  T:  04/29/2009  Job:  [id number]   cc:   Juanda Bond. Burt Knack, MD  Monico Blitz, MD

## 2010-10-04 NOTE — Assessment & Plan Note (Signed)
[facility]                          [facility]ICE NOTE   NAME:[name], [name]                  MRN:          [id number]  DATE:09/10/2007                            DOB:          1945-10-11    PRIMARY CARDIOLOGIST:  Ernestine Mcmurray, MD.   REASON FOR VISIT:  [name] returns to our clinic for discussion of  a recent abnormal adenosine stress Cardiolite.  He was originally  scheduled to be seen in the office yesterday by Dr. Dannielle Burn, but  presented with right facial droop felt to be consistent with Bell's  palsy.  He was referred to the ER, by Dr. Dannielle Burn, and was subsequently  referred to Dr. Kyra Manges, our resident neurologist, for further  evaluation.  He was diagnosed with Bell's palsy and was placed on a  prednisone taper in conjunction with Valtrex.   From a cardiac standpoint, [name] states that he has continued to  have intermittent exertional chest burning since a recent visit here  with Dr. Minus [name].  It was at that time that he was referred for a  low-level adenosine stress Cardiolite for further evaluation of such  symptoms.  He had previously not been seen in our clinic for routine  followup since 2005.  His cardiac history was well outlined by Dr.  Percival Spanish, including the results of [name]'s last cardiac  catheterization in July 2005, at which time he was found to have  continued patency of all bypass grafts, but with mild LVD (EF 45%).   In addition to exertional chest burning, the patient seems to have  significant bilateral lower extremity discomfort, possibly suggestive of  claudication as well.  These symptoms are precipitated after  approximately 50 yards and subside gradually with rest.  With respect to  chest discomfort, he also suggests that he does occasionally have some  nocturnal angina pectoris, again described as burning and similar to  that which is associated with moderate  exertion.   A 12-lead electrocardiogram at time of yesterday's visit suggested NSR  at 75 bpm with normal axis and Q-waves in leads III and aVF with T-wave  inversion in leads I and AVL.  These latter changes are not  significantly changed from an EKG from August 26, 2007.   CURRENT MEDICATIONS:  1. Aspirin 81 mg daily.  2. Lisinopril/HCT 20/25 mg daily.  3. Glipizide 5 mg daily.  4. Metformin 1000 mg q.a.m. and 500 mg q.p.m.  5. Carvedilol 3.125 mg b.i.d.  6. Prednisone taper.  7. Valtrex.   Past medical history, social history, family history:  Please refer to  Dr. Rosezella Florida complete consultation note of August 26, 2007, for full  details.   REVIEW OF SYSTEMS:  Exertional chest burning with associated dyspnea  with no significant change since his visit of a few weeks ago.  Otherwise as per HPI, remaining systems negative.   PHYSICAL EXAMINATION:  VITAL SIGNS:  Blood pressure 133/81, pulse 75  regular, weight 265.  GENERAL:  A 65 year old male, obese, sitting upright in no distress.  HEENT:  PERRL.  EOMI.  Slight inability to close  right eye.  Slight  right facial droop.  NECK:  Palpable carotid pulses without bruits; no thyromegaly; no JVD.  LYMPHATICS:  No adenopathy.  LUNGS:  Clear to auscultation bilaterally.  MUSCULOSKELETAL:  No CVA tenderness.  HEART:  Regular rate and rhythm (S1-2).  No significant murmurs.  Positive S4.  ABDOMEN:  Markedly protuberant, nontender with intact  bowel sounds.  No bruits.  EXTREMITIES:  Palpable bilateral femoral  pulses with bilateral bruits.  Intact distal pulses with 1-2+ pedal  edema.  Mild venostasis changes.  NEURO:  No focal deficits.   IMPRESSION:  1. Exertional angina pectoris/known coronary artery disease.      a.     Noncritical coronary artery disease with patent bypass       grafts by cardiac catheterization, July 2005.      b.     Non-ST segment elevation myocardial infarction/five-vessel       coronary artery bypass  graft, November 2001.      c.     Recent abnormal low-level adenosine stress Cardiolite;       ejection fraction 30%.  2. Ischemic cardiomyopathy.      a.     Ejection fraction 45% by cardiac catheterization in 2005.  3. Status post Bell's palsy.  4. Insulin-dependent diabetes mellitus.  5. Hypertension.  6. Dyslipidemia.      a.     Intolerant of Lipitor, Crestor; previously tolerant of       Mevacor.  7. History of tobacco, recently discontinued.  8. Morbid obesity.   IMPRESSION/PLAN:  1. Following review with Dr. Dannielle Burn, plan is to proceed with      diagnostic coronary angiography to exclude significant CAD      progression.  The patient is agreeable with this plan.      Risks/benefits were discussed, in conjunction with Dr. Dannielle Burn.  We      will arrange to have this scheduled in our [place] catheterization lab      later this week.  2. Regarding medications, the patient will be given a prescription for      p.r.n. nitroglycerin.  We will also increase aspirin to full dose      at 325 daily.  I will also place him back on lovastatin, which he      apparently tolerated in the past, at a starting dose of 40 mg a      day.  We will continue Coreg at the current dose, which was just      recently started by Dr. Percival Spanish.  ACE inhibitor will be continued      and metformin will be held prior to the scheduled procedure.  3. Recommend distal aortography to exclude significant      atherosclerosis, given complaint of probable bilateral intermittent      claudication.      Gene Serpe, PA-C  Electronically Signed      Ernestine Mcmurray, MD,FACC  Electronically Signed   GS/MedQ  DD: 09/10/2007  DT: 09/10/2007  Job #: [id number]   cc:   Monico Blitz, MD

## 2010-10-04 NOTE — Assessment & Plan Note (Signed)
[facility]                          [facility] OFFICE NOTE   NAME:Tibbett, JEF HOGUE                  MRN:          [id number]  DATE:09/09/2007                            DOB:          November 21, 1945    REFERRING PHYSICIAN:  Monico Blitz, MD   NO DICTATION     Ernestine Mcmurray, MD,FACC  Electronically Signed    GED/MedQ  DD: 09/09/2007  DT: 09/09/2007  Job #: [id number]

## 2010-10-04 NOTE — Assessment & Plan Note (Signed)
[facility]                          [facility]   NAME:[name], [name]                  MRN:          [id number]  DATE:10/25/2007                            DOB:          1945-07-08    REFERRING PHYSICIAN:  Dr. Manuella Ghazi   HISTORY OF PRESENT ILLNESS:  The patient is a 65 year old male with a  history of recent Bell's palsy with also coronary artery disease.  The  patient has LV dysfunction, ejection fraction 30-35%.  During last  office visit, the patient noted symptoms consist with angina and had  undergone recently a cardiac catheterization, was found to have patent  bypass graft with a long lesion of saphenous vein graft to obtuse  marginal branch which was felt not to be favorable for intervention.  This also correlated to a positive Cardiolite study.  The patient's  nitrates have been up titrated in the interim.  It is clear that his  angina has improved.  However, the patient now reports two new problems:  (1)  The patient reports increased palpitations which occur on a daily  basis.  (2)  The patient also was admitted to the hospital with  angioedema secondary to initiation of ACE inhibitor in the setting of  his heart failure.   MEDICATIONS:  1. Aspirin 81 mg p.o. daily.  2. Glipizide 5 mg p.o. daily.  3. Metformin 5 mg  two in the morning, one in the evening.  4. Lovastatin 40 mg p.o. q.h.s.  5. Carvedilol 6.5 mg p.o. b.i.d.  6. Hydrochlorothiazide 25 mg p.o. daily.  7. Isosorbide 60 mg p.o. q.a.m.   PHYSICAL EXAMINATION:  VITAL SIGNS:  Blood pressure is 139/82, heart  rate is 71 beats per minute, weight is 265 pounds.  NECK:  Normal carotid upstroke and no carotid bruits.  LUNGS:  Clear breath sounds bilaterally.  HEART:  Regular rate and rhythm with normal S1-S2.  No murmurs or rubs  or gallops.  ABDOMEN:  Soft, nontender, no rebound or guarding.  Good bowel sounds.  EXTREMITIES:  No cyanosis, clubbing or edema.  NEUROLOGICAL:  The patient is alert, oriented and grossly nonfocal.   PROBLEM LIST:  1. Exertional angina.      a.     Status post patent bypass graft with a long lesion to the       saphenous vein graft to obtuse marginal branch correlating with       positive Cardiolite study.      b.     Ejection fraction 30-35%.      c.     Decrease in frequency and duration of angina symptoms with       increase of nitrates.  2. Ischemic cardiomyopathy.  3. Palpitations.  4. Angioedema secondary to ACE inhibitor therapy.  5. Status post Bell's palsy, resolved.  6. Diabetes mellitus.  7. Hypertension.  8. Dyslipidemia.  9. History of tobacco use, discontinued.   PLAN:  1. The patient's anginal symptoms have improved.  This is reassuring,      but we will increase carvedilol, particularly in light of his  increased frequency of palpitations.  His EKG, however, in the      office demonstrates normal sinus rhythm with no PVCs with an old      infarct pattern.  2. In the setting of his ejection 35%, the patient will need a      CardioNet monitor to evaluate him for ventricular tachyarrhythmias      and he may need referral to Dr. Caryl Comes.     Ernestine Mcmurray, MD,FACC  Electronically Signed    GED/MedQ  DD: 10/25/2007  DT: 10/25/2007  Job #: [id number]   cc:   Dr. Manuella Ghazi

## 2010-10-04 NOTE — Assessment & Plan Note (Signed)
[facility]                          [facility]   NAME:[name], [name]                  MRN:          [id number]  DATE:02/17/2008                            DOB:          05/15/1946    HISTORY OF PRESENT ILLNESS:  The patient is a 65 year old male with a  history of Bell palsy as well as coronary artery disease.  The patient  has LV dysfunction with an ejection fraction of 30-35%.  The patient  underwent a cardiac catheterization due to ongoing angina.  Unfortunately, he had a long lesion of his saphenous vein graft to  obtuse marginal branch, which was not favorable to implant when his  other grafts were patent, this correlates with a positive Cardiolite  studies and nitrates were up titrated, which in general have made him  feel better.  He states that he has less chest pain, but still at times  on prolonged walks, he will have substernal chest pain.  He states he  has good and bad days, but more good days than bad days.  After he does  significant physical activity, he feels exhausted.  On occasion, he is  also woken up at night time with substernal chest pain requiring him to  take nitroglycerin.   ALLERGIES:  ACE INHIBITORS cause angioedema.   MEDICATIONS:  1. Aspirin 81 mg p.o. b.i.d.  2. Carvedilol 3.125 mg p.o. b.i.d.  3. Furosemide 80 mg in the morning and 40 mg in the p.m.  4. Metformin 100 mg p.o. b.i.d.  5. Glipizide 5 mg p.o. b.i.d.  6. Potassium 20 mEq  p.o. daily.  7. Isosorbide 60 mg p.o. day.  8. Lovastatin 40 mg p.o. daily.  9. Mirapex 0.125 daily.   He also needs potassium 20 daily, he was out of that.   PHYSICAL EXAMINATION:  VITAL SIGNS:  Blood pressure is 152/80, heart  rate 74, and weight 275 pounds.  NECK:  No carotid bruits.  LUNGS:  Clear.  HEART:  Regular rate and rhythm.  Normal S1 and S2.  No murmurs, rubs,  or gallops.  ABDOMEN:  Soft.  EXTREMITIES:  No cyanosis, clubbing or edema.  NEURO:  The patient is alert and oriented and grossly nonfocal.   PROBLEMS:  1. Exertional angina.      a.     Status post patent bypass graft along the greater saphenous       vein graft to an obtuse marginal branch correlating with positive       Cardiolite study.      b.     Ejection fraction of 30-35%.      c.     Ongoing angina symptoms despite increasing nitrates.  2. Ischemic cardiomyopathy.  3. Palpitations.  4. Angioedema secondary to ACE inhibitor therapy.  5. Status post Bell palsy.  6. Diabetes mellitus.  7. Hypertension.  8. Abdominal aortic aneurysm.  9. History of tobacco use.   PLAN:  1. The patient's angina symptoms overall have improved at times are      still significant, he even wakes up at night with chest pain.  We  have address the patient's medical regimen increase his Imdur to      120 mg p.o. daily and I will give him Norvasc 5 mg at bedtime as      anti-anginal.  We also increase his Coreg to 6.25 mg p.o. b.i.d.  2. I will see the patient back in next couple of weeks and followup      with him.  I also given him my email as I want him to relate to me      how his symptoms are involving and also have a text message to Dr.      Burt Knack to review the patient's prior cardiac catheterization.  See      if there is truly not possibility of intervention to the vein graft      to the obtuse marginal branch.     Ernestine Mcmurray, MD,FACC  Electronically Signed    GED/MedQ  DD: 02/17/2008  DT: 02/18/2008  Job #: [id number]

## 2010-10-04 NOTE — Discharge Summary (Signed)
NAMEHUTCHISON, REDDISH NO.:  [id number]   MEDICAL RECORD NO.:  [id number]          PATIENT TYPE:  OIB   LOCATION:  2020                         FACILITY:  [facility]   PHYSICIAN:  Loretha Brasil. Lia Foyer, MD, FACCDATE OF BIRTH:  [birth year]   DATE OF ADMISSION:  09/11/2007  DATE OF DISCHARGE:  09/12/2007                               DISCHARGE SUMMARY   PROCEDURES:  1. Cardiac catheterization.  2. Coronary arteriogram.  3. Left ventriculogram.  4. LIMA arteriogram.  5. Graft angiogram.   PRIMARY FINAL DISCHARGE DIAGNOSES:  1. Unstable anginal pain, secondary diagnosis status post non-ST      segment elevation myocardial infarction with subsequent      aortocoronary bypass surgery in November 2001, with LIMA to LAD,      SVG to diagonal and ramus intermedius, SVG to distal obtuse      marginal, SVG to obtuse marginal.  2. Ischemic cardiomyopathy with an EF of 35% catheterization at this      admission.  3. Recent diagnosis of Bell palsy, on steroid taper and Valtrex.  4. Diabetes.  5. Hypertension.  6. Dyslipidemia with intolerance to Lipitor and Crestor.  7. History of recent tobacco use.  8. Morbid obesity.  9. History of acute renal failure and hyperkalemia post bypass surgery      in 2001.   TIME OF DISCHARGE:  33 minutes.   HOSPITAL COURSE:  Mr. Cooksey is a 65 year old male with known  coronary artery disease.  He was seen in the [place] office having had an  adenosine cardiolite that was abnormal.  Cardiac catheterization was  recommended and he came to the hospital for this on September 11, 2007.   The cardiac catheterization showed a totaled LAD, totaled RCA, and  totaled OM.  The SVG to OM was totaled, which is a new finding.  The  LIMA to LAD, the SVG to diagonal, and ramus intermedius, and the SVG to  RCA were patent.  His EF was 35%.   On September 12, 2007, Dr. Lia Foyer reviewed the cath films with Dr. Burt Knack  who felt that the long total occlusion of the SVG  to OM was unfavorable  for percutaneous intervention and medical therapy is the best option.  Nitrates were added to his medication regimen.  His cath site was  without significant abnormality.  A followup BMET is pending, but if  this is without significant abnormality, he will be discharged home with  outpatient followup in [place].   DISCHARGE INSTRUCTIONS:  His activity level is to be increased  gradually.  He is to clean his cath site with soap and water.  He is to  follow up with Dr. Norva Karvonen on September 19, 2007, at 1:45.  He is to follow up  with Dr. Manuella Ghazi as needed.   DISCHARGE MEDICATIONS:  1. Aspirin 81 mg daily.  2. Lisinopril HCT 20/25 daily.  3. Glipizide 5 mg daily.  4. Metformin 1000 mg in the morning, 500 mg in the evening.  5. Coreg 3.125 mg b.i.d.  6. Isosorbide 30 mg daily.  7. Nitroglycerin sublingual p.r.n.  8. Continue prednisone  taper and Valtrex as ordered.  9. Lovastatin 40 mg nightly.      Rosaria Ferries, PA-C      Loretha Brasil. Lia Foyer, MD, [facility]  Electronically Signed    RB/MEDQ  D:  09/12/2007  T:  09/13/2007  Job:  [id number]   cc:   Monico Blitz, MD

## 2010-10-04 NOTE — Assessment & Plan Note (Signed)
[facility]                          [facility] NOTE   NAME:[name], [name]                  MRN:          [id number]  DATE:09/09/2007                            DOB:          24-Nov-1945    REFERRING PHYSICIAN:  Monico Blitz, MD   HISTORY:  The patient is a 65 year old male with a history of coronary  artery disease status post coronary artery bypass grafting in 2001.  The  patient recently reported symptoms of worsening chest pain and was  referred by Dr. Percival Spanish for a stress test.  The patient had abnormal LV  perfusion with an ejection fraction of 30% with a large partially  reversible apical to basal inferior defect associated with severe  hypokinesis.  The patient came in the office today to be set up for a  diagnostic catheterization.  Unfortunately this morning,  he woke up  with numbness on the right side of his face and drooping of the face.  The patient also stated that he could not close his right eye.  On  neurological examination, the patient has what appears to be a seventh  nerve palsy with inability to close the eyes with upwards rolling of his  eye. There are no other gross focal neurological deficits.  The  remainder of his cranial nerve exam appears to be intact.   Differential diagnosis however appears to be an acute CVA given his high  risk for thromboembolic disease.   MEDICATIONS:  1. Aspirin 81 mg a day.  2. Lisinopril/hydrochlorothiazide 20/25.  3. Glipizide 5 mg a day.  4. Metformin 5 mg 2 in the morning 1 in the p.m.  5. Ibuprofen 800 mg t.i.d.   PHYSICAL EXAMINATION:  VITAL SIGNS:  Blood pressure is 150/90, heart  rate 84 and regular.  HEENT:  Inability to close the right eye.  The patient is able to open  the right eye consistent with normal third nerve function.  Pupils are  equal and round and reactive to light.  NECK:  Normal carotid upstroke and no carotid bruits.  No thyromegaly.  LYMPHATICS:   No cervical, axillary or inguinal adenopathy.  LUNGS:  Clear.  BACK:  No CVA tenderness.  CHEST:  Within normal limits with no wheezes or crackles.  HEART:  Regular rate and rhythm with normal S1 and S2. No murmurs, rubs  or gallops.  ABDOMEN:  Soft, distended. No rebound or guarding.  Good bowel sounds.  No midline pulsatile mass.  The skin has no rashes or nodules.  EXTREMITIES:  No cyanosis, clubbing or edema.  NEURO:  The patient is alert and oriented and grossly nonfocal short of  a seventh nerve palsy. The gait is otherwise normal. DTRs and sensory  exam is within normal limits.   PROBLEM LIST:  1. Bell's palsy (seventh nerve palsy).      a.     Rule out CVA unlikely.      b.     Increased risk for thromboembolic disease secondary to LV       dysfunction.  2. Coronary artery disease with abnormal Cardiolite stress study.  3.  Status post coronary bypass grafting.  4. Diabetes mellitus.  5. Nephrolithiasis.  6. Hypertension.   PLAN:  1. The patient appears to have a seventh nerve palsy however, I am not      completely confident that he could not have an acute TIA/CVA and      will refer him to the emergency room for a stat CT scan. I have      discussed this with Dr. __________ .  A neurology  consult will be      obtained and if the neurology consult confirms Bell's palsy,  the      patient can be seen and later this week in consultation be set up      for cardiac catheterization.  2. Discussed with the patient and his daughter abnormal stress test      results, but his symptoms are not unstable and we should have the      above problem taken care of first. He will be scheduled later this      week for cardiac catheterization.     Ernestine Mcmurray, MD,FACC  Electronically Signed    GED/MedQ  DD: 09/09/2007  DT: 09/09/2007  Job #: [id number]   cc:   Monico Blitz, MD

## 2010-10-04 NOTE — Assessment & Plan Note (Signed)
OFFICE VISIT   [name], [name]  DOB:  August 06, 1945                                       11/05/2009  [id number]   Patient presents today for continued follow-up of his infrarenal aortic  aneurysm and left popliteal artery aneurysm.  He recently underwent a CT  scan at [facility], and I have reviewed this independently and  discussed it with patient.  He reports no new changes in his symptoms.  He continues to have multiple complaints of the lower extremities.  He  does clearly have right calf claudication related to his right  superficial artery occlusion but also has numbness and burning sensation  bilaterally in his left thigh and both feet.  This does not appear  related to arterial insufficiency.  He has had no new cardiac  difficulties.  He continues to try weight loss but is having a difficult  time with this.   MEDICATIONS:  Listed on his chart.  He is on aspirin daily.   PHYSICAL EXAMINATION:  A well-developed, obese white male appearing  stated age.  Blood pressure is 124/68, pulse 74, respirations 18.  He is  in no acute distress.  HEENT is normal.  Abdomen is obese.  No  tenderness.  No masses.  I do not palpate his aneurysm.  He does have 2+  radial and 2+ femoral pulses.  He has absent right distal pulses and has  2+ left posterior tibial pulse.  He does have a prominent popliteal  artery pulsation in his left knee.  Musculoskeletal shows no major  cyanosis or deformities.  Neurologic:  No focal weakness or  paresthesias.  Skin without ulcers or rashes.   I have reviewed his CT angiogram from 06/13 at [facility].  This  shows no change in his infrarenal aneurysm which is approximately just  over 5 cm in diameter.  His left popliteal artery aneurysm is also  unchanged at 2.7 cm with mural thrombus present.   I discussed both of these with patient.  I explained that he is at the  threshold for repair for both his left  popliteal artery and his  infrarenal abdominal aortic aneurysm.  He has a significant cardiac risk  for this.  He does have some slight reversed taper in his infrarenal  aorta, but I feel that he in all likelihood would be a candidate for  stent grafting should he need repair.  He will see [place] again in 6 months  and again discuss symptoms of occlusion of his left popliteal artery or  leaking infrarenal abdominal aortic aneurysm.     Rosetta Posner, M.D.  Electronically Signed   TFE/MEDQ  D:  11/05/2009  T:  11/05/2009  Job:  [id number]   cc:   Monico Blitz, MD  Ernestine Mcmurray, MD,FACC  Juanda Bond. Burt Knack, MD

## 2010-10-04 NOTE — Procedures (Signed)
DUPLEX ULTRASOUND OF ABDOMINAL AORTA   INDICATION:  Followup evaluation of known abdominal aortic aneurysm.   HISTORY:  Diabetes:  Yes.  Cardiac:  Coronary artery bypass graft.  Hypertension:  Yes.  Smoking:  Quit in 2008.  Connective Tissue Disorder:  Family History:  No.  Previous Surgery:  No.   DUPLEX EXAM:         AP (cm)                   TRANSVERSE (cm)  Proximal             3.8 cm                    3.5 cm  Mid                  4.5 cm                    4.5 cm  Distal               3.4 cm                    2.7 cm  Right Iliac          1.0 cm                    1.1 cm  Left Iliac           0.9 cm                    1.1 cm   PREVIOUS:  4.5 by CAT scan   IMPRESSION:  Abdominal aortic aneurysm measurements are stable compared  to previous study.   ___________________________________________  Rosetta Posner, M.D.   MC/MEDQ  D:  05/29/2008  T:  05/29/2008  Job:  [id number]

## 2010-10-04 NOTE — Assessment & Plan Note (Signed)
[facility]                          [facility] NOTE   NAME:[name], [name]                  MRN:          [id number]  DATE:08/26/2007                            DOB:          [birth year]    PRIMARY CARE PHYSICIAN:  Monico Blitz, M.D.   REASON FOR PRESENTATION:  Evaluate patient with coronary disease and  chest discomfort.   HISTORY OF PRESENT ILLNESS:  The patient is a 65 year old gentleman with  a history of coronary disease, status post CABG in 2001.  His last cath  was reported below.  He is followed by Dr. Manuella Ghazi.  I do not think he has  participated in secondary risk reduction very aggressively.  He is now  describing chest discomfort.  This has been slowly getting worse.  It  happens with exertion such as walking 100 yards.  He says he will get  chest discomfort.  He has to stop what he is doing and the pain will go  away after 30 minutes.  He has never taken any nitroglycerin.  He does  not described associated nausea, vomiting, or diaphoresis.  He gets  short of breath with moderate activity.  He does describe occasionally  waking up short of breath.  He does not describe orthopnea.  He does not  have any palpitations.  He has had no presyncope or syncope.  He has had  slowly progressive lower extremity swelling.  He says his legs go numb  when he walks.   PAST MEDICAL HISTORY:  1. Coronary artery disease (Last catheterization 2005.  Left main was      free of critical disease.  The LAD was occluded.  There was a small      ramus intermediate without critical disease.  The circumflex and a      circumflex marginal vessel.  The right coronary is occluded.  The      saphenous vein graft to the acute marginal is widely patent.      Saphenous vein graft to the distal marginal was widely patent.      Saphenous vein graft to the diagonal intermediate was patent.  LIMA      to the LAD was widely patent.  The EF at that time was  45%).  2. Nephrolithiasis.  3. Diabetes mellitus x4 years.  4. Hypertension.  5. Erectile dysfunction.   PAST SURGICAL HISTORY:  1. Left first toe amputated secondary to gangrene.  2. Recent hand surgery.  3. CABG (LIMA to the LAD, SVG sequential to diagonal and ramus      intermediate, SVG to acute marginal, SVT to circumflex in 2001).   ALLERGIES/INTOLERANCES:  CODEINE.   MEDICATIONS:  1. Aspirin 81 mg daily.  2. Lisinopril/HCT 20/25 daily.  3. Glipizide 5 mg daily.  4. Metformin 1000 mg every morning and 500 mg every evening.  5. Ibuprofen 800 mg t.i.d.  6. Endocet.   SOCIAL HISTORY:  The patient smoked for about 20 years and says he is  rarely smoking cigarettes now.  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  FAMILY HISTORY:  Contributory for his father dying of a myocardial  infarction at age 15.   REVIEW OF SYSTEMS:  As stated in the HPI and otherwise negative for  other systems other than erectile dysfunction.   PHYSICAL EXAMINATION:  GENERAL:  The patient is in no acute distress.  VITAL SIGNS:  Blood pressure 145/91, heart rate 84 and regular.  HEENT:  Eyelids unremarkable.  Pupils are equal, round, and reactive to  light .  Fundi not visualized.  Oral mucosa unremarkable.  NECK:  No jugular venous distention at 45 degrees.  Carotid upstroke  brisk and symmetric.  No bruits.  No thyromegaly.  LYMPHATICS:  No cervical, axillary, or inguinal adenopathy.  LUNGS:  Clear to auscultation bilaterally.  BACK:  No costovertebral angle tenderness.  CHEST:  Unremarkable.  HEART:  PMI not displaced or sustained.  S1 and S2 within normal limits.  No S3, no S4, no clicks, no rubs, no murmurs.  ABDOMEN:  Morbidly obese.  Positive bowel sounds, normal in frequency  and pitch.  No bruits.  No rebound.  No guarding.  No midline pulsatile  mass.  No hepatomegaly.  No splenomegaly.  SKIN:  No rashes.  No nodules.  EXTREMITIES:  Two plus upper  pulses, one plus dorsalis pedis and  posterior tibialis bilaterally.  Mild bilateral lower extremity edema.  NEUROLOGIC:  Oriented to person, place and time.  Cranial nerves II-XII  grossly intact.  Motor grossly intact.   EKG:  Sinus rhythm, rate 75, old inferior infarct, poor anterior R wave  progression, no acute ST-T wave changes.   ASSESSMENT/PLAN:  1. Coronary disease.  The patient has coronary disease as described.      He is now getting exertional chest discomfort.  He does not look      like he has taken very good care of himself.  It has been 4 years      since his last evaluation.  I will schedule him for an adenosine      perfusion study.  With his leg discomfort, he would not be able to      ambulate on a treadmill.  2. Tobacco.  He is advised about needing to stop smoking completely.  3. Hypertension.  His blood pressure is slightly elevated.  He needs      weight loss.  I am going to start carvedilol which can be up      titrated.  At one point in time he was on beta-blocker but a nurse      stopped it, apparently because his blood pressure was low or his      heart rate was low.  He has never had any documented hypotension or      brady arrhythmias.  4. Ischemic cardiomyopathy.  His ejection fraction was low in 2005.  I      am going to start carvedilol 3.125 mg twice a day.  This can be up      titrated.  Following his stress perfusion study, if he has had any      change to his ejection fraction we should consider an      echocardiogram.  5. Lower extremity swelling.  He has got some beginning changes of      chronic venous stasis.  I suspect this is the issue with his lower      extremity swelling which is mild.  I do not suspect that this is      related to  pulmonary hypertension but again this could be checked      with an echocardiogram.  He needs to keep his feet elevated, avoid      salt, and most importantly loose weight.   FOLLOWUP:  He should come back to  this clinic in about one month to  follow up these issues.     Minus Breeding, MD, [facility]  Electronically Signed    JH/MedQ  DD: 08/26/2007  DT: 08/26/2007  Job #: [id number]   cc:   Monico Blitz, MD

## 2010-10-04 NOTE — Consult Note (Signed)
NEW PATIENT CONSULTATION   [name], [name]  DOB:  [birth year]                                       11/29/2007  [id number]   The patient presents today for evaluation of a new diagnosis of  abdominal aortic aneurysm.  I have his CAT scan from [facility]  from 10/30/2007 for evaluation.  He had evaluation for back pain and  lower extremity weakness.  MRI an incidental finding of an aneurysm.  He  subsequently underwent a CT scan, which I have for review.  He had no  prior knowledge of this and does not have any family history of  aneurysms.   PAST MEDICAL HISTORY:  Significant for coronary artery disease,  undergoing urgent coronary artery bypass grafting by Dr. Servando Snare in  2001.  He does have spinal stenosis in his lumbar region as well.  He  does have a history of type 2 diabetes, hypertension.  He does have a  history of Bell's palsy.   CURRENT MEDICATIONS:  Aspirin 81 mg daily, metformin 500 mg 1 b.i.d.,  hydrochlorothiazide lisinopril 12.5/20 mg daily, glipizide 5 mg daily,  carvedilol 3.125 mg daily, lovastatin 40 mg daily, Imdur 60 mg daily.   REVIEW OF SYSTEMS:  Positive for headache, lower extremity numbness.   ALLERGIES:  None.   PHYSICAL EXAM:  The patient is a moderately obese well-developed white  male appearing his stated age of 6.  He is grossly intact  neurologically.  His carotid arteries are without bruits bilaterally.  He has 2+ radial, 2+ femoral, 2+ popliteal, and 2+ posterior tibial  pulses.  His carotid arteries are without bruits bilaterally.  Heart is  regular rate and rhythm.  Chest clear bilaterally.  Abdominal exam  reveals obesity.  I do not feel a prominent pulse, but he does have  lower extremity edema from his knees distally.  He does have prior vein  harvest incisions.   I reviewed his CAT scan with him and this does show a 4.5 cm infrarenal  abdominal aortic aneurysm.  The CT scan stops at the level of  bifurcation, but the aneurysm extends down to the aortic bifurcation.  He does have extension up to the level of the renal arteries.  I  discussed open surgical repair and stent graft repair.  I do not feel  that he would be a candidate for stent graft repair base on the location  of his aneurysm extending to his renals.  I explained that we would  recommend considering operative procedure early since his risk of  rupture is minimal with a 4.5 cm aneurysm.  We will see him in 6 months  with repeat ultrasound to rule out expansion of his aneurysm.  I  discussed symptoms of leaking aneurysm with him.  He knows to report  immediately to the emergency room should this occur.   Rosetta Posner, M.D.  Electronically Signed   TFE/MEDQ  D:  11/29/2007  T:  11/29/2007  Job:  [id number]   cc:   Dr. Kyra Manges  Ernestine Mcmurray, MD,FACC  Monico Blitz, MD

## 2010-10-04 NOTE — Procedures (Signed)
DUPLEX ULTRASOUND OF ABDOMINAL AORTA   INDICATION:  Followup AAA, left popliteal aneurysm.   HISTORY:  Diabetes:  Yes.  Cardiac:  CABG.  Hypertension:  Yes.  Smoking:  Quit.  Connective Tissue Disorder:  Family History:  No.  Previous Surgery:  No.   DUPLEX EXAM:         AP (cm)                   TRANSVERSE (cm)  Proximal             2.8 cm                    cm  Mid                  4.7 cm                    cm  Distal               3.5 cm                    cm  Right Iliac          1.5 cm                    cm  Left Iliac           1.2 cm                    cm   PREVIOUS:  Date:  05/29/2008  AP:  4.5  TRANSVERSE:  4.5   IMPRESSION:  1. Previous left popliteal on CT scan of 11/01/2009 measured 2.7 cm.  2. No significant change from previous study of 05/29/2008.  3. Mid abdominal aortic aneurysm measuring approximately 4.7 cm,      slightly tortuous.  4. 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  5. Right superficial femoral artery occlusion.   ___________________________________________  Rosetta Posner, M.D.   LT/MEDQ  D:  05/10/2010  T:  05/10/2010  Job:  [id number]

## 2010-10-07 NOTE — Cardiovascular Report (Signed)
[place]. [facility]  Patient:    [name], [name]                  MRN: [id number] Proc. Date: 04/13/00 Adm. Date:  [id number] Attending:  Lorenza Evangelist CC:         Sherril Cong, M.D.  Carlena Bjornstad, M.D. [facility]   Cardiac Catheterization  PROCEDURES PERFORMED: 1. Left heart catheterization. 2. Left ventriculogram. 3. Selective coronary angiography. 4. Left subclavian angiogram.  DIAGNOSES: 1. Three-vessel coronary artery disease. 2. Mild left ventricular systolic dysfunction.  INDICATIONS:  Mr. Calderin is a 65 year old gentleman who was admitted to [facility] with substernal chest discomfort.  The patient had baseline abnormalities in his ECG with subsequent analysis showing evidence for a non-Q-wave myocardial infarction.  He was stabilized medically and transferred to [facility] for left heart catheterization.  TECHNIQUE:  After informed consent was obtained, the patient was brought to the cardiac catheterization lab where both groins were sterilely prepped and draped.  Lidocaine 1% was used to infiltrate the right groin, and a 6 French sheath placed into the right femoral artery using the modified Seldinger technique.  Preformed 6 French Judkins catheter was then used to engage the left and right coronary arteries and selective angiography performed in various projections.  The JR4 catheter was used to perform left subclavian angiogram to opacify the internal mammary artery.  The patient tolerated the procedure well and at the termination of the case, the catheters and sheaths were removed and manual pressure applied until adequate hemostasis was achieved.  He was then transferred to the ward in stable condition.  FINDINGS:  Findings are as follows: 1. Left main trunk:  The left main trunk is a large caliber vessel with    mild irregularities. 2. Left anterior descending:  This is a large caliber vessel that provides  a diagonal branch in the proximal segment.  The LAD is occluded in its    mid section.  The distal LAD fills via collaterals from the RCA.  The    first diagonal branch has a high-grade narrowing of 70% at its ostium    extending into the proximal segment.  Collateral flow is also noted in    its distal section from the RCA. 3. Left circumflex artery:  This is a large caliber vessel that provides a    small first marginal and a second marginal branch at the proximal segment.    The AV circumflex has mild irregularities.  The large first marginal branch    has a narrowing of 50-60% in its proximal segment.  It is noted to    bifurcate in its mid section. 4. Right coronary artery:  The right coronary artery is dominant.  This is a    large caliber vessel that provides the posterior descending artery and    posterior ventricular branches in its terminal segment.  The right coronary    artery is occluded in its mid section.  The distal PDA as well as the    LAD are noted to fill via collaterals from the RV marginal branches.  LEFT VENTRICULOGRAM:  Mildly dilated end-systolic and end-diastolic dimensions.  Overall left ventricular function is mildly impaired.  Ejection fraction approximately 45%.  There is no mitral regurgitation. Akinesis of the basal inferior wall is noted.  LV pressure is 112/10, aortic was 112/64, LVEDP equals 18.  Left subclavian artery:  Widely patent.  Internal mammary artery:  The internal mammary artery  extends to the diaphragm and is of normal caliber.  There is mild tortuosity in the mid section.  ASSESSMENT AND PLAN:  Mr. Cheeves is a 65 year old gentleman with three-vessel coronary artery disease and mild left ventricular dysfunction. Due to the number of stenoses and high-grade nature of these, he will be referred for coronary artery bypass graft surgery. DD:  04/13/00 TD:  04/13/00 Job: [id number]

## 2010-10-07 NOTE — Op Note (Signed)
[place]. [facility]  Patient:    NALAN, CAPIZZI                  MRN: [id number] Proc. Date: 04/16/00 Adm. Date:  [id number] Attending:  Montine Circle CC:         Christy Sartorius, M.D. [facility]   Operative Report  PREOPERATIVE DIAGNOSIS:  Coronary occlusive disease with recent myocardial infarction.  POSTOPERATIVE DIAGNOSIS:  Coronary occlusive disease with recent myocardial infarction.  PROCEDURE:  Coronary artery bypass grafting x 5 with the left internal mammary to the left anterior descending coronary artery, sequential reverse saphenous vein graft to the first diagonal and intermediate coronary artery, reverse saphenous vein graft to the distal circumflex coronary artery, reverse saphenous vein graft to the acute marginal coronary artery.  SURGEON:  Lilia Argue. Servando Snare, M.D.  ASSISTANT:  Sheliah Hatch, P.A.  BRIEF HISTORY:  Patient is a 65 year old male, who presented with acute myocardial infarction.  Several days prior to surgery, he was stabilized medically, underwent cardiac catheterization by Dr. Lyndel Safe.  This revealed total occlusion of the right coronary artery just distal to the takeoff of a large acute marginal with a 90% lesion, an acute marginal had a collateral filling to a totally occluded LAD.  The distal right coronary artery was diffusely diseased and had very peripheral branches filling by collaterals. The circumflex coronary artery was patent with an approximately 50% proximal stenosis.  The first diagonal was patent with an 80-90% proximal stenosis and an intermediate coronary artery had approximately 50-60% stenosis.  Patient had depressed LV function approximately 40% with evidence of inferior and anterior myocardial infarctions in the past.  Because of his 3-vessel coronary artery disease, coronary artery bypass grafting was recommended to the patient, who agreed and signed informed consent.  DESCRIPTION OF PROCEDURE:   With Swan-Ganz and arterial line monitors in place, patient underwent general endotracheal anesthesia without incident.  Skin of the chest and legs was prepped with Betadine and draped in usual sterile manner.  Vein was harvested from each lower extremity below the knee and was adequate quality and caliber.  A median sternotomy was performed.  Left internal mammary artery was dissected down as a pedicle graft.  The distal artery was divided, had good free-flow.  The pericardium was opened.  Patient had a relatively short aorta, which will make redo bypass surgery more difficult.  He was systemically heparinized.  Ascending aorta and the right atrium were cannulated and the aortic root vent/cardioplegia needle was introduced into the ascending aorta.  Patient was placed on cardiopulmonary bypass at 2.4 L/min/m.sq.  Sites of anastomoses were selected and dissected out of the epicardium.  The patients body temperature was cooled to 30 degrees, aortic cross-clamp was applied and 500 cc of cold-blood potassium cardioplegia was administered with rapid diastolic arrest of the heart. Myocardial septal temperature was monitored throughout the cross-clamp. Attention was turned first to the very distal right coronary artery.  This vessel was very thickened and attempt to open it there was no visible lumen. The posterior descending artery was also very diffusely diseased without a lumen except at the very, very distal part, where it was less than 1 mm in size.  Posterolateral branch was also a very small branch, less than 1 mm in size.  The bypass to the distal right system was abandoned.  The acute marginal coronary artery was opened and it was 1.5 to 1.6 mm in size.  Using a short segment of vein  was anastomosed to the acute marginal coronary artery and additional cold-blood cardioplegia administered down the vein graft. Attention was then turned to the distal circumflex coronary artery, which  was opened.  This vessel was diffusely diseased.  The distal anastomosis was performed with a running 7-0 Prolene.  Attention was then turned to the first diagonal branch, which was opened and admitted a 1.5 mm probe.  Using a short, natural Y, an end-to-side anastomosis was carried out.  The distal extent of the same vein was then opened and using a running 7-0 Prolene distal anastomosis was performed.  Attention was then turned to the left anterior descending coronary artery and the mid portion of the vessel was opened and admitted a 1.5 mm probe.  Using a running 8-0 Prolene, left internal mammary artery was anastomosed to the left anterior descending coronary artery.  With release of [name] bulldog on the mammary artery, there was appropriate rise in myocardial septal temperature.  Aortic cross-clamp was removed with a total cross-clamp time of 69 minutes.  Patient required electrodefibrillation, returned to a sinus rhythm.  Partial occlusion clamp was placed on the ascending aorta.  Three punch aortotomies were performed and each of the three vein grafts were anastomosed to the ascending aorta.  Air was evacuated from the grafts and the partial occlusion clamp was removed.  Sites of anastomoses were inspected and free of bleeding.  Patient was then ventilated and weaned from cardiopulmonary bypass without difficulty and remained hemodynamically stable.  He was decannulated in usual fashion.  Protamine sulfate was administered with operative field hemostatic.  Two atrial and two ventricular pacing wires were applied.  Graft markers applied.  A left pleural tube and two mediastinal tubes were left in place.  Sternum was closed with #6 stainless steel wire.  Fascia closed with interrupted 0 Vicryl, running 3-0 Vicryl and subcutaneous tissue 4-0 subcuticular stitch in the skin edges.  Dry dressings were applied.  Sponge and needle count was reported as correct at completion of the  procedure.  Patient tolerated the procedure well without obvious complication, was transferred to surgical intensive care unit for further postoperative care.  DD:  04/16/00 TD:  04/16/00 Job: [id number]

## 2010-10-07 NOTE — Consult Note (Signed)
[place]. [facility]  Patient:    [name], [name]                  MRN: [id number] Proc. Date: 04/17/00 Adm. Date:  [id number] Attending:  Montine Circle                          Consultation Report  REASON FOR CONSULTATION:  Acute renal failure and hyperkalemic.  CONSULTING PHYSICIAN:  Lilia Argue. Servando Snare, M.D.  HISTORY OF PRESENT ILLNESS:  This is a 65 year old gentleman who was admitted from [facility] and has a several week history of progressive chest pain and was found to have severe coronary artery disease and was admitted on April 12, 2000, and had a catheterization on April 13, 2000, and had a CABG on April 16, 2000.  He has no known history of renal disease but he has a history of renal stones x 3 in the past 2 years.  He is a smoker of about a half to 3/4s a day over 30 years.  History of no nonsteroidal use, urinary tract infections, and no family history of renal disease.  He has had no family history of hearing or visual deficits either.  He has no known history of hypertension, diabetes, asthma, and no hospitalizations other than a hospitalization for a injury for a crushed foot where he had a resection of his first digit on his left foot.  He does complain of chronic edema, but he has no PND, nor orthopnea.  He has nocturia x 2.  He has no shortness of breath with exertion.  He denies any difficulty voiding.  PAST MEDICAL HISTORY:  As above.  He has a nephrolithiasis including a lithotripsy in between them.  History of the left great toe amputation.  SOCIAL HISTORY:  He is single, a Administrator.  He does have 3 children.  FAMILY HISTORY:  His father died in his 19s of multiple problems including hypertension, stroke and diabetes.  Mother died in her 22s of diabetes.  REVIEW OF SYSTEMS:  HEENT:  He denies any visual difficulty.  Occasionally he has headaches.  He had headaches up until his mid 20s, that were  migraine. Cardiovascular:  As per cardiology.  Does have exertional chest pain which has increased in frequency pretty dramatically here.  Pulmonary:  No cough, sputum production or asthma.  GI:  No history of hepatitis, yellow jaundice, GI bleeding, diarrhea, or constipation.  Skin:  No history of rashes or other problems.  Musculoskeletal:  No problems.  Neurologic:  Denies any difficulties.  PHYSICAL EXAMINATION:  GENERAL:  No acute distress.  He is pale.  VITAL SIGNS:  Blood pressure 120-130/60s.  Heart rate is 60s.  HEENT:  Benign, including his fundi, his ears, and his throat.  NECK:  Shows no thyromegaly or no lymphadenopathy.  CARDIOVASCULAR:  Regular rhythm.  Systolic rub, heard best at the left sternal border.  There is PMI that is 11 cm at the ______ 5th intercostal space.  EXTREMITIES:  Decreased dorsalis pedis pulses only trace.  1+ edema.  Femoral pulses are 2+/4+.  No bruits are noted.  LUNGS:  Reveal crackles in their left base.  ABDOMEN:  Positive bowel sounds.  Obese, soft.  SKIN:  Benign, without rash or lesions.  MUSCULOSKELETAL:  Absent left great toe on the left foot.  No other musculoskeletal abnormalities.  LABORATORY DATA:  Creatinine on admission 1.1, now 2.0.  Serum  sodium 137, potassium 6.4, chloride 106, bicarbonate 25, creatinine 2, BUN 21.  Glucose of 158.  Hemoglobin 13, platelets 313,000.  White count 25,400, with an albumin of 3.  ASSESSMENT: 1. Acute increase in creatinine and potassium.  Most likely this is dye    toxicity in the setting of ACE inhibitor, Toradol, beta blocker and heparin    all of which can cause hyperkalemia.  Rule out hyperadrenalism.     Bicarbonate is now decreased over his baseline so he does have an early    renal tubular acidosis, but this is most likely drug induced and toxin    induced.  He has an increased potassium mode, status post CABG with    cellular injury and bleeding.  Cannot excrete that with his  renal injury    and the ACE inhibitors and nonsteroidals on board.     The roll of the increased white count and decreased albumin are not clear    at this point.  No evidence of atheroemboli.  Potassium is decreased    to 4.4 after Lasix, glucose, insulin and Kayexalate.  2. Status post CABG.  3. History of renal stones.  The roll of this in the process is not real    clear.  PLAN: 1. Urinalysis. 2. Urine, sodium, and creatinine will monitor. 3. Have a renal ultrasound. 4. Follow up his potassium and creatinine. DD:  04/17/00 TD:  04/17/00 Job: [id number]

## 2010-10-07 NOTE — Discharge Summary (Signed)
NAME:  [name], [name]                     ACCOUNT NO.:  [id number]   MEDICAL RECORD NO.:  [id number]                   PATIENT TYPE:  INP   LOCATION:  [id number]                                 FACILITY:  [facility]   PHYSICIAN:  Kirk Ruths, M.D. [facility]            DATE OF BIRTH:  06-15-1945   DATE OF ADMISSION:  11/27/2003  DATE OF DISCHARGE:  12/01/2003                           DISCHARGE SUMMARY - REFERRING   SUMMARY OF HISTORY:  Mr. Segovia is a 65 year old male, well known to our  [facility], with a history of multivessel coronary artery  disease, status post bypass surgery in November 2001.  He is not followed  regularly by a primary care physician, and the last time he was seen in our  [place] office was in December 2004.  Approximately two days prior to his  presentation at [facility], on November 26, 2003, he was awakened in the  early morning with chest burning sensation and a pressure like sensation  associated with orthopnea.  He took several aspirin and after about two  hours his symptoms subsided.  However, the following day he had a soreness  in his left chest with some numbness in his left arm.  It occurred  intermittently throughout the day.  He ultimately came to the emergency room  for further evaluation.  He initially received relief with nitroglycerin.  This had been discontinued, and he remained symptom free.  He was admitted  by Dr. Feliciana Rossetti to [facility], and Dr. Domenic Polite saw him in consultation  on November 27, 2003.   MEDICAL HISTORY:  In addition to the above, includes:  1. Hyperlipidemia.  2. His bypass surgery included a LIMA to the LAD, a saphenous vein graft to     the diagonal, a saphenous vein graft to the circumflex, a saphenous vein     graft to the AM.  EF was 40-55%.  3. Continued tobacco use.   LABORATORY DATA:  At [facility]:  PTT was 24, PT 12.1.  H&H 15.0 and  43.3, normal indices, platelets 413, WBC 11.2.  Sodium 137,  potassium 3.5,  BUN 15, creatinine 1.0, glucose 162.  Normal LFTs.  CK and troponins x 2, at  [facility], were within normal limits.  At [facility]:  Lipids were performed which showed a total cholesterol 213, triglycerides  161, HDL 33, LDL 148.  Hemoglobin A1c was elevated at 6.7.  BNP was 123.1.  Subsequent hematologies and chemistries were essentially unremarkable.   Chest x-ray, from [facility], showed mild pulmonary venous congestion without  acute changes.   HOSPITAL COURSE:  At [facility], apparently a Cardiolite was  performed which showed an EF of 43% and inferior hypokinesis with a  partially reversible inferior defect.  Dr. 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  He felt that he should undergo cardiac  catheterization, thus he was transferred to [facility] for further  evaluation.  On November 27, 2003, he was admitted to 3700.  Overnight, he did  not have any further problems.  He remained symptom free throughout the  weekend.  A smoking cessation consult was obtained on November 30, 2003.  Catheterization was performed on November 30, 2003.  This revealed native three-  vessel coronary artery disease; however, all grafts were patent.  EF was 45%  with inferior basal akinesis.  Post suture removal and bed rest, the patient  was ambulating without difficulty.  Dr. Lia Foyer recommended checking a D-  dimmer and this was elevated at 9.2.  Because of his elevated D-dimmer and  his high risk for pulmonary embolism, a CT was performed.  CT scan  performed, on December 01, 2003, preliminary results were negative for pulmonary  embolism.  Dr. Dannielle Burn felt that if the CT was negative, he could be  discharged home with continued medical treatment.   DISCHARGE DIAGNOSES:  1. Atypical chest discomfort.     a.  Catheterization did not reveal any significant coronary artery        disease.     b. Elevated D-dimmer, however, computed tomography is negative for        pulmonary embolism.  2. Continue tobacco use.  3. Hyperlipidemia.  4. His bypass surgery included a LIMA to the LAD, a saphenous vein graft to     the diagonal, a saphenous vein graft to the circumflex, a saphenous vein     graft to the AM.  EF was 40-55%.   DISPOSITION:  1. He received a new prescription for Protonix 40 mg every day.  Asked to     continued his Altace 5 mg every day, Lopressor 12.5 b.i.d., Crestor 10 mg     every day, aspirin 162 every day, __________  as previously, and     sublingual nitroglycerin.  2. He was advised on lifting, driving, sexual activity, or heavy exertion     for two days.  3. Maintain low-salt-fat-cholesterol diet.  4. If he has any problems with his catheterization site, he was asked to     call the office.  5. He was advised no smoking or tobacco products.  6. He will follow up with Dr. Domenic Polite in the [facility] office on December 22, 2003     at 1 p.m.  At the time of that followup, consideration should be to     changing or advancing his lipid medication with his continued     hyperlipidemia and encourage cardiac risk factor modification.      Sharyl Nimrod, P.A. [facility]                    Kirk Ruths, M.D. [facility]    EW/MEDQ  D:  12/01/2003  T:  12/01/2003  Job:  [id number]

## 2010-10-07 NOTE — Consult Note (Signed)
[place]. [facility]  Patient:    [name], [name]                  MRN: [id number] Proc. Date: 04/14/00 Adm. Date:  [id number] Attending:  Lorenza Evangelist [id number] CC:         Christy Sartorius, M.D. [facility]   Consultation Report  REASON FOR CONSULTATION:  A patient with an acute myocardial infarction.  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  He presented to [facility] on April 11, 2000, with a two-day history of increasing discomfort in the midsternum, which on April 11, 2000, began radiating to the left arm.  He drove himself to the emergency room at [facility].  CPK-MB revealed a total of 223, MB of 21.  He noted significant relief with sublingual nitroglycerin.  He stabilized medically and then was transferred here on April 12, 2000.  Serial troponins went from 1.2 to 2.96.  His admission electrocardiogram showed possible anterior and inferior myocardial infarctions.  A cardiac catheterization was performed on April 13, 2000, by Dr. Christy Sartorius, which showed approximately 60% circumflex lesion, total occlusion of the right coronary artery, with a large acute marginal, supplying collaterals to a totally-occluded LAD.  There is also an intermediate and a diagonal branch noted.  The diagonal has approximately a 50%-70% lesion.  Overall the ejection fraction is approximately 45% with inferior akinesis.  The patient denies any previous history of diabetes mellitus.  He does have a long history of smoking.  Denies any pulmonary history in the past.  ALLERGIES:  No known drug allergies.  CURRENT MEDICATIONS ON ADMISSION:  None.  PAST MEDICAL HISTORY: 1. Nephrolithiasis. 2. History of  lithotripsy. 3. Left first toe amputation.  FAMILY HISTORY:  The patients father died at age 30 with a history of coronary artery disease.  Does have a positive family history for diabetes mellitus.  SOCIAL HISTORY: The patient is single and has three children. He is currently employed as a long Fish farm manager.  He denies any alcohol use.  Smokes approximately 1/2 pack of cigarettes a day for 20 years.  REVIEW OF SYSTEMS:  The patient denies any resting or exertional shortness of breath.  He denies any orthopnea.  He does have lower extremity edema, especially after driving long distances.  He denies syncope or palpitations. GENERAL:  The patient has no weight gain but says that over the past year he has had muscle wasting of his arms and legs, but overall a net gain of weight. Denies any respiratory problems.  Denies any blood in his stool or urine. Denies any neurologic problems.  Denies any musculoskeletal abnormalities. Denies any urinary symptoms.  He has no previous history of any hematologic disorders.  Denies diabetes mellitus, though he does note recently drinking more water than usual.  He does have a positive family history of diabetes mellitus.  He denies any psychiatric history.  Denies any claudication.  PHYSICAL EXAMINATION:  VITAL SIGNS:  Temperature 97 degrees, blood pressure 103/40, heart rate 50, sinus.  O2 saturation 98%.  NECK:  No palpable masses.  No jugular venous distention.  LUNGS:  Clear bilaterally.  There is no active wheezing.  He has a faint early systolic murmur grade  1/6.  ABDOMEN:  Benign.  Though the patient has a protuberant abdomen, I do not appreciate any definite aortic dilatation.  EXTREMITIES:  Left first toe amputation.  He has palpable DP and PT pulses bilaterally.  Veins appear to be adequate in both lower extremities.  LABORATORY DATA:  At [facility] revealed a total CPK of 160 with MB of 7.2, troponin of  1.62.  Cholesterol 228, LDL 155, HDL 35, triglycerides 190.  White count 13,000, hematocrit 34.  Creatinine 1.1, BUN 15.  IMPRESSION: 1. A patient with coronary artery occlusive disease with acute    myocardial infarction, subendocardial. 2. Hyperlipidemia. 3. Tobacco use. 4. Obesity.  SUGGESTIONS:  After reviewing the films and discussing, and examining the patient, I agree with the recommendation for a coronary artery bypass grafting because of the patients symptoms, and significant three-vessel coronary artery disease.  The risks and options have been discussed with the patient in detail, including the risk of death, infection, stroke, myocardial infarction, bleeding, blood transfusion with bypass surgery.  The patient has had his questions answered, and is willing to proceed with the tentatively planned surgery on April 16, 2000.  The patient is agreeable with this approach and has had his questions answered. DD:  04/14/00 TD:  04/14/00 Job: [id number]

## 2010-10-07 NOTE — Discharge Summary (Signed)
[place]. [facility]  Patient:    [name], [name]                  MRN: [id number] Adm. Date:  [id number] Disc. Date: 04/21/00 Attending:  Montine Circle Dictator:   Lestine Box, RNFA CC:         M. Rowe Pavy, M.D.  Carlena Bjornstad, M.D. [facility], [place], [place]  Joyice Faster. Deterding, M.D.   Discharge Summary  DATE OF BIRTH:  February 28, 1946  DATE OF SURGERY:  April 16, 2000  ADMISSION DIAGNOSIS:  Acute myocardial infarction.  PAST MEDICAL HISTORY: 1. Nephrolithiasis. 2. Tobacco use (one-half pack per day x 20 years). 3. Left great toe amputation.  ALLERGIES:  The patient has no known drug allergies.  DISCHARGE DIAGNOSES: 1. Non-Q-wave myocardial infarction. 2. Three-vessel coronary artery disease with mild left ventricular    dysfunction, status post coronary artery bypass grafting. 3. Postoperative transient elevated creatinine, resolved. 4. Postoperative transient elevated potassium, resolved. 5. Postoperative elevated white blood count, resolving.  ADMISSION HISTORY:  On admission to [facility] he reported a two-week history of exertional chest pain.  This pain worsened two days prior to admission.  He went to the [facility] Emergency Department with chest pain rated at 9/10.  This was relieved with sublingual nitroglycerin.  He had several episodes of breakthrough chest pain while at [facility].  HOSPITAL COURSE:  On November 22, Mr. Hye was transferred from [facility] and admitted to Dr. Dola Argyle service.   He had no chest pain on admission to [facility].  He was started on IV heparin, nitroglycerin, p.o. aspirin, and Integrilin.  On November 23, he underwent cardiac catheterization by Dr. Lyndel Safe which revealed three-vessel coronary artery disease with an ejection fraction of approximately 45%.  Doppler studies were performed which revealed no significant coronary artery disease, and he was noted to have  bilateral palpable pedal pulses.  On November 24, cardiac surgery consult was obtained with Dr. Lanelle Bal who recommended coronary artery bypass grafting as appropriate treatment for this patient.  On  November 26, Mr. Floren underwent uncomplicated coronary artery bypass grafting x 5 with Dr. Lanelle Bal.  Grafts placed at the time of procedure were left internal mammary artery to the LAD, saphenous vein was grafted in a sequential fashion to the first diagonal and to the intermediate, saphenous vein was grafted to the acute marginal, saphenous vein was grafted to the OM. Postoperatively, he was noted to have elevated potassium, creatinine, and white blood cell count.  On November 27, a renal consult was obtained with Dr. Jimmy Footman who recommended renal ultrasound.  This was performed on November 27.  The result was noted as unremarkable and on hydronephrosis.  Creatinine and potassium returned to within normal limits.  His white blood cell count is resolving.  He has no fever.  Mr. Guernsey has made good progress since his surgery.  Dr. Servando Snare anticipates him ready for discharge to home tomorrow, Saturday, December 1.  MEDICATIONS ON DISCHARGE: 1. Enteric-coated aspirin 325 mg q.d. 2. Percocet one to two p.o. q.4-6h. p.r.n. 3. Lopressor 25 mg b.i.d.  DISCHARGE FOLLOWUP:  Mr. Poteat will have an appointment arranged to see Dr. Ron Parker in the [place] office in approximately two weeks and have a chest x-ray taken at that time, and he has an appointment to see Dr. Servando Snare on December 20 at 11:30 in the morning. DD:  04/20/00 TD:  04/20/00 Job: [id number]

## 2010-10-07 NOTE — Cardiovascular Report (Signed)
NAME:  [name], [name]                     ACCOUNT NO.:  [id number]   MEDICAL RECORD NO.:  [id number]                   PATIENT TYPE:  INP   LOCATION:  3741                                 FACILITY:  [place]   PHYSICIAN:  Loretha Brasil. Lia Foyer, M.D. [facility]         DATE OF BIRTH:  07-04-1945   DATE OF PROCEDURE:  11/30/2003  DATE OF DISCHARGE:                              CARDIAC CATHETERIZATION   PROCEDURES PERFORMED:  1. Left heart catheterization.  2. Selective coronary arteriography.  3. Selective left ventriculography.  4. Aortic root aortography.  5. Saphenous vein graft angiography times three.  6. Selective left internal mammary angiography times one.   CARDIOLOGIST:  Loretha Brasil. Lia Foyer, M.D.   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  He has had prior revascularization  surgery.  The current study is done to assess coronary anatomy.   DESCRIPTION OF THE PROCEDURE:  The procedure was performed from the right  femoral artery using 6 French catheters.   The patient tolerated the procedure well.   COMPLICATIONS:  There were no complications.   An internal mammary artery catheter was used to inject the internal mammary.   HEMODYNAMIC DATA:  1. Central aorta 152/86, mean 112.  2. Left ventricle 156/12.  3. No gradient on pullback across the aortic valve.   ANGIOGRAPHIC DATA:  1. Ventriculography:  Ventriculography was performed in the RAO projection.     There was inferobasal akinesis as noted on the previous study.  Ejection     fraction to be estimated in the range of 45%.   1. Left Main: The left main is free of critical disease.   1. LAD:  The LAD is occluded just after a tiny diagonal.   1. The left internal mammary to the distal LAD is widely patent.  There is     some filling of the distal right coronary circulation from this.   1. There is a saphenous vein graft to a diagonal and intermediate.   It is     visualized in multiple views.  It appears to be patent.   1. There is a small ramus intermedius without critical disease.   1. Circumflex:  The circumflex is severely diseased providing a circumflex     marginal branch.   1. The saphenous vein graft to the distal marginal is widely patent.   1. The AV circumflex provides collaterals to the distal right.   1. Right Coronary Artery:  The right coronary artery is totally occluded in     it midportion.   1. The saphenous vein graft to the acute marginal is widely patent.   IMPRESSION:  The patient has had recurrent chest discomfort.  The etiology  of this is uncertain.  He is a long distance Administrator.   We will check a D-dimer.   Importantly all his grafts appear to be patent at the present time.  The  right coronary was never grafted because the distal right was too severely  diseased.   We will continue a medical treatment program and check the D-dimer.  He will  need follow up with Dr. Domenic Polite in [place].                                               Loretha Brasil. Lia Foyer, M.D. [facility]    TDS/MEDQ  D:  11/30/2003  T:  12/01/2003  Job:  [id number]   cc:   Satira Sark, M.D. [facility]   CV Laboratory   Duke Salvia, M.D.  [place], [place]

## 2010-10-21 ENCOUNTER — Other Ambulatory Visit: Payer: Self-pay | Admitting: Cardiology

## 2010-11-15 ENCOUNTER — Inpatient Hospital Stay (HOSPITAL_COMMUNITY)
Admission: EM | Admit: 2010-11-15 | Discharge: 2010-11-19 | DRG: 301 | Disposition: A | Discharge status: Other Acute Inpatient Hospital | Payer: Medicare Other | Attending: Surgery | Admitting: Surgery

## 2010-11-15 DIAGNOSIS — I714 Abdominal aortic aneurysm, without rupture, unspecified: Principal | ICD-10-CM | POA: Diagnosis present

## 2010-11-15 DIAGNOSIS — E785 Hyperlipidemia, unspecified: Secondary | ICD-10-CM | POA: Diagnosis present

## 2010-11-15 DIAGNOSIS — Z01810 Encounter for preprocedural cardiovascular examination: Secondary | ICD-10-CM

## 2010-11-15 DIAGNOSIS — I10 Essential (primary) hypertension: Secondary | ICD-10-CM | POA: Diagnosis present

## 2010-11-15 DIAGNOSIS — I2510 Atherosclerotic heart disease of native coronary artery without angina pectoris: Secondary | ICD-10-CM

## 2010-11-15 DIAGNOSIS — E119 Type 2 diabetes mellitus without complications: Secondary | ICD-10-CM | POA: Diagnosis present

## 2010-11-15 DIAGNOSIS — Z951 Presence of aortocoronary bypass graft: Secondary | ICD-10-CM

## 2010-11-15 DIAGNOSIS — I252 Old myocardial infarction: Secondary | ICD-10-CM

## 2010-11-15 LAB — BASIC METABOLIC PANEL
BUN: 12 mg/dL (ref 6–23)
Calcium: 9.1 mg/dL (ref 8.4–10.5)
Chloride: 103 mEq/L (ref 96–112)
Creatinine, Ser: 1.03 mg/dL (ref 0.50–1.35)
GFR calc Af Amer: >60 mL/min (ref >60)
Potassium: 3.9 mEq/L (ref 3.5–5.1)

## 2010-11-15 LAB — CBC
HCT: 41.5 % (ref 39.0–52.0)
MCH: 33 pg (ref 26.0–34.0)
MCV: 96.5 fL (ref 78.0–100.0)
RBC: 4.3 MIL/uL (ref 4.22–5.81)
RDW: 13.9 % (ref 11.5–15.5)
WBC: 9.7 10*3/uL (ref 4.0–10.5)

## 2010-11-15 LAB — PROTIME-INR
INR: 0.99 (ref 0.00–1.49)
Prothrombin Time: 13.3 seconds (ref 11.6–15.2)

## 2010-11-15 LAB — ABO/RH: ABO/RH(D): O NEG

## 2010-11-15 LAB — GLUCOSE, CAPILLARY: Glucose-Capillary: 106 mg/dL — ABNORMAL HIGH (ref 70–99)

## 2010-11-16 DIAGNOSIS — I714 Abdominal aortic aneurysm, without rupture, unspecified: Secondary | ICD-10-CM

## 2010-11-16 LAB — GLUCOSE, CAPILLARY
Glucose-Capillary: 122 mg/dL — ABNORMAL HIGH (ref 70–99)
Glucose-Capillary: 105 mg/dL — ABNORMAL HIGH (ref 70–99)
Glucose-Capillary: 106 mg/dL — ABNORMAL HIGH (ref 70–99)

## 2010-11-17 ENCOUNTER — Inpatient Hospital Stay (HOSPITAL_COMMUNITY): Payer: Medicare Other

## 2010-11-17 DIAGNOSIS — I714 Abdominal aortic aneurysm, without rupture, unspecified: Secondary | ICD-10-CM

## 2010-11-17 LAB — BASIC METABOLIC PANEL
BUN: 16 mg/dL (ref 6–23)
Calcium: 8.9 mg/dL (ref 8.4–10.5)
Chloride: 100 mEq/L (ref 96–112)
Creatinine, Ser: 1.35 mg/dL (ref 0.50–1.35)
GFR calc Af Amer: >60 mL/min (ref >60)
GFR calc non Af Amer: 54 mL/min — ABNORMAL LOW (ref >60)

## 2010-11-17 LAB — CBC
HCT: 40.1 % (ref 39.0–52.0)
MCHC: 33.4 g/dL (ref 30.0–36.0)
Platelets: 331 10*3/uL (ref 150–400)
RDW: 13.7 % (ref 11.5–15.5)

## 2010-11-17 LAB — GLUCOSE, CAPILLARY
Glucose-Capillary: 149 mg/dL — ABNORMAL HIGH (ref 70–99)
Glucose-Capillary: 149 mg/dL — ABNORMAL HIGH (ref 70–99)

## 2010-11-18 ENCOUNTER — Inpatient Hospital Stay (HOSPITAL_COMMUNITY): Payer: Medicare Other

## 2010-11-18 DIAGNOSIS — I714 Abdominal aortic aneurysm, without rupture, unspecified: Secondary | ICD-10-CM

## 2010-11-18 LAB — GLUCOSE, CAPILLARY
Glucose-Capillary: 133 mg/dL — ABNORMAL HIGH (ref 70–99)
Glucose-Capillary: 133 mg/dL — ABNORMAL HIGH (ref 70–99)
Glucose-Capillary: 137 mg/dL — ABNORMAL HIGH (ref 70–99)

## 2010-11-18 LAB — COMPREHENSIVE METABOLIC PANEL
ALT: 11 U/L (ref 0–53)
Albumin: 3.2 g/dL — ABNORMAL LOW (ref 3.5–5.2)
Alkaline Phosphatase: 61 U/L (ref 39–117)
Glucose, Bld: 114 mg/dL — ABNORMAL HIGH (ref 70–99)
Potassium: 3.7 mEq/L (ref 3.5–5.1)
Sodium: 140 mEq/L (ref 135–145)
Total Protein: 6.3 g/dL (ref 6.0–8.3)

## 2010-11-19 LAB — CROSSMATCH: ABO/RH(D): O NEG

## 2010-11-19 LAB — GLUCOSE, CAPILLARY: Glucose-Capillary: 130 mg/dL — ABNORMAL HIGH (ref 70–99)

## 2010-11-21 MED ORDER — IOHEXOL 350 MG/ML SOLN
100.0000 mL | Freq: Once | INTRAVENOUS | Status: AC | PRN
  Administered 2010-11-21: 100 mL via INTRAVENOUS

## 2010-12-09 NOTE — Consult Note (Signed)
[name], [name] NO.:  [id number]  MEDICAL RECORD NO.:  [id number]  LOCATION:  [id number]                         FACILITY:  [place]  PHYSICIAN:  Theotis Burrow IV, MDDATE OF BIRTH:  1945/10/20  DATE OF CONSULTATION:  11/15/2010 DATE OF DISCHARGE:                                CONSULTATION   ADMIT DIAGNOSIS:  Abdominal aortic aneurysm.  HISTORY OF PRESENT ILLNESS:  The patient is a 65 year old Caucasian male with a known abdominal aortic aneurysm who presented to the [facility] last night with complaints of severe left flank and back pain.  The patient states that he had left back pain for several days prior to this and last night while he was adjusting his CPAP, he noticed severe pain that actually threw him on the floor.  It took him several hours for the pain to subside enough for him to get up and go to the emergency room.  The patient has a history of kidney stones, but states that he did not noticed any hematuria or other LUTS recently.  The patient is followed by Dr. Donnetta Hutching for known aneurysm.  On presentation to the emergency room in [facility], a CT scan was performed with and without contrast and this revealed a 4.8 cm abdominal aortic aneurysm as well as 2.8 cm right iliac artery aneurysm.  There was also a 9 mm right renal stone with no evidence of left kidney stones or hydronephrosis in.   There is a 1.1 cm minimally complex cyst of the left lower pole.  The patient was hemodynamically stable.  Secondary to his pain and known aneurys,  the patient was transferred to [facility] for further evaluation.  The patient's past medical history is also significant for hypertension, hyperlipidemia, coronary artery disease, status post MI and CABG and peripheral arterial disease.  PAST MEDICAL HISTORY: 1. Gout. 2. Hypertension. 3. Known abdominal aortic aneurysm. 4. Coronary artery disease, status post coronary artery bypass graft     in 2001. 5.  Hyperlipidemia. 6. Peripheral arterial disease. 7. Umbilical hernia. 8. Diabetes mellitus, non-insulin dependent. 9. AFib. 10.GERD.  PAST SURGICAL HISTORY: 1. Left great toe amputation secondary to gangrene. 2. Coronary artery bypass grafting. 3. Hand surgery.  ALLERGIES: 1. ARBs. 2. LISINOPRIL.  MEDICATIONS: 1. Glipizide. 2. Isosorbide. 3. Nitroglycerin sublingual. 4. Metformin. 5. Furosemide. 6. Aspirin. 7. Gabapentin. 8. Simvastatin. 9. Amlodipine. 10.Carvedilol. 11.Actos. 12.Allopurinol.  FAMILY HISTORY:  Diabetes mellitus and coronary artery disease.  SOCIAL HISTORY:  The patient quit smoking in 2009 and denies alcohol use.  He resides at home and is divorced.  REVIEW OF SYSTEMS:  Please see HPI for pertinent positives and negatives, otherwise a complete review of systems is negative.  PHYSICAL EXAMINATION:  GENERAL:  The patient is resting comfortably in no acute distress.  He is obese. HEENT:  Normocephalic, atraumatic.  PERRLA.  EOMI.  No carotid bruits are noted. CARDIAC:  Regular rate and rhythm. LUNGS:  Clear to auscultation. ABDOMEN:  Soft with active bowel sounds.  He is tender in midline, superior to the umbilicus.  He has a easily reducible umbilical hernia. There is no guarding or rebound noted.  No masses are noted. MUSCULOSKELETAL:  There are 2+ radial  and femoral pulses noted.  Pedal pulses are not palpable.  Motor and sensation are intact in all extremities. NEUROLOGIC:  Nonfocal.  Cranial nerves II through XII are intact. SKIN:  No evidence of rashes, cyanosis, or breakdown.  LABORATORY DATA:  CBC and BMP from [facility] on November 15, 2010, sodium 141, potassium 4.1, BUN 14, creatinine 1.22.  White count 10.8, hemoglobin 13.7, hematocrit 41.7, platelets 274.  CTA of the abdomen and pelvis on November 15, 2010, revealed an abdominal aortic aneurysm, maximum diameter 4.8 cm and a proximally dilated right iliac artery measuring up to 2.8  cm.  There is a 9 mm right renal stone with no evidence of hydro.  No stones or hydro noted on the left.  There is a 1.1 cm minimally complex left lower pole cyst.  Sigmoid diverticulosis without extraluminal bowel inflammatory process noted. There is a ventral hernia.  Prostate gland is normal, slightly enlarged. There are enlarged lymph nodes and external iliac nodes.  ASSESSMENT: 1. Hypertension. 2. Hyperlipidemia. 3. Coronary artery disease. 4. Abdominal aortic aneurysm with back pain. 5. Abdominal exam, abnormal with tenderness to palpation.  PLAN: 1. The patient will be admitted to unit 3300 for continued close     observation. 2. He will be n.p.o. 3. Dr. Trula Slade to review his CT scans with radiologist and determine     whether or not, the patient should require urgent abdominal aortic     aneurysm repair.     Leta Baptist, PA   ______________________________ V. Leia Alf, MD    AY/MEDQ  D:  11/15/2010  T:  11/16/2010  Job:  [id number]  Electronically Signed by Leta Baptist PA on 11/17/2010 09:59:46 AM Electronically Signed by Orvan Falconer IV MD on 12/04/2010 11:04:44 PM Electronically Signed by Orvan Falconer IV MD on 12/08/2010 09:44:39 AM Electronically Signed by Orvan Falconer IV MD on 12/08/2010 09:47:50 AM Electronically Signed by Orvan Falconer IV MD on 12/08/2010 09:48:51 AM Electronically Signed by Orvan Falconer IV MD on 12/09/2010 08:17:57 AM

## 2010-12-09 NOTE — Discharge Summary (Signed)
[name], [name] NO.:  [id number]  MEDICAL RECORD NO.:  [id number]  LOCATION:  [id number]                         FACILITY:  [place]  PHYSICIAN:  Theotis Burrow IV, MDDATE OF BIRTH:  01/31/1946  DATE OF ADMISSION:  11/15/2010 DATE OF DISCHARGE:                        DISCHARGE SUMMARY - REFERRING   ADMISSION DIAGNOSIS:  Abdominal aortic aneurysm.  HISTORY OF PRESENT ILLNESS:  This is a 65 year old white male with a known abdominal aortic aneurysm who presented to the [facility] the night before with complaints of severe left flank and back pain. The patient states that he had left severe back pain for several days prior to this and the night before while he was adjusting his CPAP, he noticed severe pain that actually threw him on the floor.  It took him several hours for the pain to subside, enough for him to get up and go to the emergency room.  The patient has a history of kidney stones, but states that he did not notice any hematuria or hematochezia recently. The patient is followed by Dr. Donnetta Hutching for a known aneurysm.  On presentation to the emergency department in [place], a CT scan was performed with and without contrast and this revealed a 4.8-cm abdominal aortic aneurysm as well as a 2.8-cm right iliac artery aneurysm.  There is also a 9-mm right renal stone with no evidence of left kidney stones or hydronephrosis in the left.  There is a 1.1-cm minimally complex cyst of the left lower pole.  The patient was hemodynamically stable secondary to his pain and aneurysm.  He was transferred to [facility] for further evaluation.  PAST MEDICAL HISTORY:  Also, significant for hypertension, hyperlipidemia, coronary artery disease, status post MI and CABG and peripheral arterial disease.  HOSPITAL COURSE:  The patient is admitted to the hospital and underwent evaluation for his abdominal aortic aneurysm.  A Cardiology consult was obtained for  preoperative evaluation and they state the patient does have extensive coronary artery disease.  However, he has had no new symptoms since his cardiac catheterization in 2009.  However, upon Dr. Trula Slade speaking to the cardiologist, he stated that this patient would be high risk for a surgical procedure.  CTA of the abdomen revealed a fusiform infrarenal abdominal aortic aneurysm with maximal transverse diameter 4.8-cm with the centric mural thrombus and aneurysmal sack. Dr. Trula Slade did send the films to [place], where they reviewed the films and the patient needs a four-vessel fenestrated and a repair. The patient has been stable and relatively pain free until this morning. He states that his abdominal pain has worsened.  The patient did have spine MRI study to rule out any spinal injuries and this was negative. So per Dr. Stephens Shire conversation with Cardiology, the patient has cardiac high risk for Open repair.  The aortic neck is approximately 38- mm, initially Dr. Trula Slade to refer the patient to Dr. Sammuel Hines for evaluation.  The initial conversation suggested of four-vessel fenestration.  Subsequently, since, the patient is asymptomatic this a.m., Dr. Rona Ravens is transferring the patient to [facility] for asymptomatic AAA requiring fenestrated endograft repair.  The case was discussed with Dr. Janalyn Harder.  DISCHARGE DIAGNOSIS: 1. Symptomatic abdominal  aortic aneurysm involving the right common     iliac artery. 2. Sigmoid and descending colon diverticulosis. 3. Coronary artery disease.     a.     Status post coronary artery bypass graft in 2001. 4. Hypertension. 5. Hyperlipidemia. 6. Gout. 7. Peripheral arterial disease. 8. Diabetes, non-insulin dependent. 9. Umbilical hernia. 10.Atrial fibrillation. 11.Gastroesophageal reflux disease. 12.History of left great toe amputation secondary to gangrene. 13.History of hand surgeries.  ALLERGIES:  ARBS and  LISINOPRIL.  DISCHARGE MEDICATIONS: 1. Colace 100 mg p.o. daily. 2. Protonix 40 mg p.o. daily. 3. Allopurinol 100 mg p.o. daily. 4. Zocor 10 mg p.o. nightly. 5. Norvasc 5 mg p.o. daily. 6. Neurontin 300 mg p.o. b.i.d. 7. Lasix 40 mg p.o. b.i.d. 8. Coreg 25 mg p.o. b.i.d. 9. Aspirin 162 mg p.o. daily. 10.Imdur 180 mg p.o. daily. 11.Oxycodone 5-10 mg p.o. q.4-6 h. p.r.n. pain. 12.Ultram 50 mg p.o. q.6 h. p.r.n. mild pain.  DISCHARGE INSTRUCTIONS:  The patient is discharged to [facility] per discussion with Dr. Janalyn Harder for a four-vessel fenestrated endograft repair as the patient is high risk per Cardiology.  The patient will be transferred with his most recent CTA and MRI.     Evorn Gong, PA   ______________________________ V. Leia Alf, MD    SE/MEDQ  D:  11/19/2010  T:  11/19/2010  Job:  [id number]  cc:   Rosetta Posner, M.D. Juanda Bond. Burt Knack, MD Dr. Sharlynn Oliphant, MD,FACC  Electronically Signed by Evorn Gong PA on 11/30/2010 11:40:21 AM Electronically Signed by Orvan Falconer IV MD on 12/04/2010 11:04:47 PM Electronically Signed by Orvan Falconer IV MD on 12/08/2010 09:44:43 AM Electronically Signed by Orvan Falconer IV MD on 12/08/2010 09:47:54 AM Electronically Signed by Orvan Falconer IV MD on 12/08/2010 09:48:59 AM Electronically Signed by Orvan Falconer IV MD on 12/09/2010 08:17:57 AM

## 2011-01-14 ENCOUNTER — Other Ambulatory Visit: Payer: Self-pay | Admitting: Cardiology

## 2011-01-19 ENCOUNTER — Ambulatory Visit (INDEPENDENT_AMBULATORY_CARE_PROVIDER_SITE_OTHER): Payer: Medicare Other

## 2011-01-19 ENCOUNTER — Other Ambulatory Visit (INDEPENDENT_AMBULATORY_CARE_PROVIDER_SITE_OTHER): Payer: Medicare Other

## 2011-01-19 DIAGNOSIS — I739 Peripheral vascular disease, unspecified: Secondary | ICD-10-CM

## 2011-02-02 ENCOUNTER — Encounter: Payer: Self-pay | Admitting: Vascular Surgery

## 2011-02-02 NOTE — Procedures (Unsigned)
DUPLEX ULTRASOUND OF ABDOMINAL AORTA  INDICATION:  Followup AAA, left popliteal aneurysm.  HISTORY: Diabetes:  Yes. Cardiac:  CABG. Hypertension:  Yes. Smoking:  Quit. Connective Tissue Disorder: Family History:  No. Previous Surgery:  No.  DUPLEX EXAM:         AP (cm)                   TRANSVERSE (cm) Proximal             4.23 cm                   4.19 cm Mid                  4.47 cm                   4.54 cm Distal               4.01 cm                   Not visualized Right Iliac          Not visualized            Not visualized Left Iliac           Not visualized            Not visualized  PREVIOUS:  Date:  05/10/2010  AP:  4.7  TRANSVERSE:  IMPRESSION: 1. No significant change in comparison to the previous abdominal     aortic aneurysm exam performed 05/10/2010. 2. Left distal superficial femoral artery aneurysm measuring 2.59 x     2.48 cm was noted. 3. The distal superficial femoral artery aneurysm appears stable in     comparison to the previous exam.  ___________________________________________ Rosetta Posner, M.D.  EM/MEDQ  D:  01/20/2011  T:  01/20/2011  Job:  [id number]

## 2011-02-09 ENCOUNTER — Other Ambulatory Visit: Payer: Self-pay | Admitting: Vascular Surgery

## 2011-02-09 DIAGNOSIS — I724 Aneurysm of artery of lower extremity: Secondary | ICD-10-CM

## 2011-02-09 DIAGNOSIS — I714 Abdominal aortic aneurysm, without rupture: Secondary | ICD-10-CM

## 2011-02-14 LAB — CBC
HCT: 38.4 — ABNORMAL LOW
Hemoglobin: 13.3
MCHC: 34.5
MCV: 95.2
RBC: 4.04 — ABNORMAL LOW
WBC: 17 — ABNORMAL HIGH

## 2011-02-14 LAB — BASIC METABOLIC PANEL
BUN: 22
CO2: 26
CO2: 26
Calcium: 9.1
Chloride: 100
Chloride: 104
Creatinine, Ser: 1.11
Creatinine, Ser: 1.31
GFR calc Af Amer: >60
GFR calc Af Amer: >60
GFR calc non Af Amer: >60
Potassium: 3.6
Potassium: 3.8
Sodium: 136

## 2011-02-14 LAB — PROTIME-INR: Prothrombin Time: 12.1

## 2011-02-28 ENCOUNTER — Encounter: Payer: Self-pay | Admitting: Cardiology

## 2011-03-03 ENCOUNTER — Encounter: Payer: Self-pay | Admitting: Cardiology

## 2011-03-03 ENCOUNTER — Ambulatory Visit (INDEPENDENT_AMBULATORY_CARE_PROVIDER_SITE_OTHER): Payer: Medicare Other | Admitting: Physician Assistant

## 2011-03-03 DIAGNOSIS — Z79899 Other long term (current) drug therapy: Secondary | ICD-10-CM

## 2011-03-03 DIAGNOSIS — E785 Hyperlipidemia, unspecified: Secondary | ICD-10-CM

## 2011-03-03 DIAGNOSIS — I5022 Chronic systolic (congestive) heart failure: Secondary | ICD-10-CM

## 2011-03-03 DIAGNOSIS — Z951 Presence of aortocoronary bypass graft: Secondary | ICD-10-CM

## 2011-03-03 DIAGNOSIS — I714 Abdominal aortic aneurysm, without rupture: Secondary | ICD-10-CM

## 2011-03-03 DIAGNOSIS — I779 Disorder of arteries and arterioles, unspecified: Secondary | ICD-10-CM

## 2011-03-03 MED ORDER — SIMVASTATIN 40 MG PO TABS: 40.0000 mg | ORAL_TABLET | Freq: Every evening | ORAL | Status: DC

## 2011-03-03 NOTE — Assessment & Plan Note (Signed)
Euvolemic by history and PE.

## 2011-03-03 NOTE — Progress Notes (Signed)
HPI: Patient presents for scheduled visit.  Since his last visit here, March 2012, with Dr. Dannielle Burn, he was hospitalized at [facility], June 2012, with severe left flank/back pain. He underwent extensive evaluation and there was initial concern that his symptoms might be related to his known infrarenal AAA. Arrangements were made to transfer him directly to [facility], following review of films by the vascular team there, with initial recommendation to proceed with complex endograft repair. Prior to transfer, he was seen by Dr. Percival Spanish, for preoperative clearance, who concluded that the patient's risk for perioperative complications was not prohibitive.  The patient informs me today, however, that the surgery did not take place at [facility], following direct transfer. Instead, he suggests that he was diagnosed with gastroparesis. He states that he was told his stomach "shut down", thus explaining his severe flank and back pain. His symptoms did gradually improve, and are now resolved. Since then, he has had a scheduled surveillance ultrasound of his known AAA, with apparent stable dimensions, per Dr. Curt Jews, with whom he has been following for several years.  From a cardiac standpoint, he does not suggest any significant change from his baseline of stable chronic exertional angina. He denies any symptoms at a moderate pace. He has not had to use any prn nitroglycerin.  The patient underwent recent eye exam, and apparently will need cataract surgery. During the examination, he was found to have cholesterol emboli of the left eye, and was instructed to convey this information to [place]. He was told that this possibly could have come from his carotid arteries. Review of data base here at [facility] reveals no recent carotid Doppler studies .  Allergies  Allergen Reactions  . Codeine     REACTION: delirium  . Lisinopril     REACTION: angioedema    Current Outpatient Prescriptions on File Prior to Visit    Medication Sig Dispense Refill  . allopurinol (ZYLOPRIM) 100 MG tablet Take 100 mg by mouth daily.        Marland Kitchen amLODipine (NORVASC) 2.5 MG tablet Take 2.5 mg by mouth daily.        Marland Kitchen aspirin 81 MG tablet Take by mouth daily.        Marland Kitchen COREG 25 MG tablet TAKE 1 TABLET TWICE DAILY  60 each  6  . furosemide (LASIX) 40 MG tablet Take 80 mg by mouth daily.        Marland Kitchen gabapentin (NEURONTIN) 300 MG capsule Take 1 capsule (300 mg total) by mouth 2 (two) times daily.  60 capsule  6  . glipiZIDE (GLUCOTROL) 10 MG tablet Take 10 mg by mouth 2 (two) times daily before a meal.        . indomethacin (INDOCIN) 25 MG capsule Take 25 mg by mouth 3 (three) times daily with meals.        . isosorbide mononitrate (IMDUR) 120 MG 24 hr tablet Take 120 mg by mouth daily.        . metFORMIN (GLUCOPHAGE) 1000 MG tablet Take 1,000 mg by mouth 2 (two) times daily with a meal.        . Misc Natural Products (TART CHERRY ADVANCED) CAPS Take by mouth daily.        . nitroGLYCERIN (NITROSTAT) 0.4 MG SL tablet Place 0.4 mg under the tongue every 5 (five) minutes as needed.        . simvastatin (ZOCOR) 10 MG tablet Take 1 tablet (10 mg total) by mouth at bedtime.  30 tablet  6  . vitamin C (ASCORBIC ACID) 500 MG tablet Take 500 mg by mouth daily.          Past Medical History  Diagnosis Date  . Exertional angina   . Ischemic cardiomyopathy   . Palpitations   . Angioedema     secondary to [employer] E inhibitor therapy  . Bell palsy     status post   . Diabetes mellitus   . AAA (abdominal aortic aneurysm)   . HTN (hypertension)   . PAD (peripheral artery disease)     with total occlusion of  the right SFA and an ABI 0.5 on the right side    Past Surgical History  Procedure Date  . Coronary artery bypass graft     EF 30-35%    History   Social History  . Marital Status: Single    Spouse Name: N/A    Number of Children: N/A  . Years of Education: N/A   Occupational History  . Not on file.   Social History Main  Topics  . Smoking status: Former Research scientist (life sciences)  . Smokeless tobacco: Not on file  . Alcohol Use: No  . Drug Use: No  . Sexually Active: Not on file   Other Topics Concern  . Not on file   Social History Narrative  . No narrative on file    Family History  Problem Relation Age of Onset  . Diabetes Neg Hx   . Hypertension Neg Hx   . Coronary artery disease Neg Hx     ROS: Negative for exertional chest pain, DOE, orthopnea, PND, lower extremity edema, palpitations, presyncope/syncope, claudication, reflux, hematuria, hematochezia, or melena. Remaining systems reviewed, and are negative.   PHYSICAL EXAM:  BP 109/62  Pulse 59  Ht 6\' 1"  (1.854 m)  Wt 255 lb (115.667 kg)  BMI 33.64 kg/m2  GENERAL: well-nourished, well-developed; NAD HEENT: NCAT, PERRLA, EOMI; sclera clear; no xanthelasma NECK: palpable bilateral carotid pulses, no bruits; no JVD; no TM LUNGS: CTA bilaterally CARDIAC: RRR (S1, S2); no significant murmurs; no rubs or gallops ABDOMEN: Protuberant, nontender EXTREMETIES: intact distal pulses; no significant peripheral edema SKIN: warm/dry; no obvious rash/lesions MUSCULOSKELETAL: no joint deformity NEURO: no focal deficit; NL affect  EKG:    ASSESSMENT & PLAN:

## 2011-03-03 NOTE — Assessment & Plan Note (Signed)
We'll order carotid Dopplers to rule out significant ICA disease, based on recent finding of cholesterol emboli in his left eye, by his ophthalmologist.

## 2011-03-03 NOTE — Patient Instructions (Signed)
Follow up as scheduled. Your physician has requested that you have a carotid duplex. This test is an ultrasound of the carotid arteries in your neck. It looks at blood flow through these arteries that supply the brain with blood. Allow one hour for this exam. There are no restrictions or special instructions. Finish current supply of Norvasc (amlodipine) and then stop. Once you stop Norvasc, increase your Zocor (simvastatin) to 40 mg every night. A new prescription was sent to your pharmacy to reflect this change.  Your physician recommends that you go to the [facility] for a FASTING lipid profile and liver function labs. Do not eat or drink after midnight. DO LAB 3 MONTHS AFTER INCREASING SIMVASTATIN.

## 2011-03-03 NOTE — Assessment & Plan Note (Signed)
Followed by Dr. Curt Jews, with most recent study indicating stable dimensions.

## 2011-03-03 NOTE — Assessment & Plan Note (Addendum)
Most recent LDL 115, 7/12. Therefore, recommend increasing Zocor to 40 mg daily, but only after he finishes his current supply of amlodipine. I considered Lipitor, given its greater potency. However, he tells me that he was not able to tolerate either this, or Crestor, secondary to myalgia. Recommended LDL target 70 or less, if feasible. Also, he indicated that he was recently diagnosed with cholesterol emboli in his left eye, by his ophthalmologist.

## 2011-03-03 NOTE — Assessment & Plan Note (Signed)
Stable on current medication regimen. No further workup indicated, at present time.

## 2011-03-08 ENCOUNTER — Encounter (INDEPENDENT_AMBULATORY_CARE_PROVIDER_SITE_OTHER): Payer: Medicare Other | Admitting: *Deleted

## 2011-03-08 DIAGNOSIS — H53129 Transient visual loss, unspecified eye: Secondary | ICD-10-CM

## 2011-03-15 ENCOUNTER — Encounter (HOSPITAL_COMMUNITY)
Admission: RE | Admit: 2011-03-15 | Discharge: 2011-03-15 | Disposition: A | Discharge status: Home / Self Care | Payer: Medicare Other | Source: Ambulatory Visit | Attending: Ophthalmology | Admitting: Ophthalmology

## 2011-03-15 ENCOUNTER — Encounter (HOSPITAL_COMMUNITY): Payer: Self-pay

## 2011-03-15 HISTORY — DX: Sleep apnea, unspecified: G47.30

## 2011-03-15 HISTORY — DX: Unspecified osteoarthritis, unspecified site: M19.90

## 2011-03-15 LAB — CBC
Platelets: 326 10*3/uL (ref 150–400)
RDW: 14.1 % (ref 11.5–15.5)
WBC: 10.4 10*3/uL (ref 4.0–10.5)

## 2011-03-15 LAB — BASIC METABOLIC PANEL
Chloride: 101 mEq/L (ref 96–112)
Creatinine, Ser: 1.25 mg/dL (ref 0.50–1.35)
GFR calc Af Amer: 69 mL/min — ABNORMAL LOW (ref >90)
Potassium: 4.3 mEq/L (ref 3.5–5.1)

## 2011-03-15 NOTE — Patient Instructions (Signed)
[place]  03/15/2011   Your procedure is scheduled on:  03/23/2011  Report to [facility] at 0720 AM.  Call this number if you have problems the morning of surgery: [phone]   Remember:   Do not eat food:After Midnight.  Do not drink clear liquids: After Midnight.  Take these medicines the morning of surgery with A SIP OF WATER: allopurinol, coreg, indocin, imdur   Do not wear jewelry, make-up or nail polish.  Do not wear lotions, powders, or perfumes. You may wear deodorant.  Do not shave 48 hours prior to surgery.  Do not bring valuables to the hospital.  Contacts, dentures or bridgework may not be worn into surgery.  Leave suitcase in the car. After surgery it may be brought to your room.  For patients admitted to the hospital, checkout time is 11:00 AM the day of discharge.   Patients discharged the day of surgery will not be allowed to drive home.  Name and phone number of your driver: driver  Special Instructions: N/A   Please read over the following fact sheets that you were given: Pain Booklet, MRSA Information, Surgical Site Infection Prevention, Anesthesia Post-op Instructions and Care and Recovery After Surgery  PATIENT INSTRUCTIONS POST-ANESTHESIA  IMMEDIATELY FOLLOWING SURGERY:  Do not drive or operate machinery for the first twenty four hours after surgery.  Do not make any important decisions for twenty four hours after surgery or while taking narcotic pain medications or sedatives.  If you develop intractable nausea and vomiting or a severe headache please notify your doctor immediately.  FOLLOW-UP:  Please make an appointment with your surgeon as instructed. You do not need to follow up with anesthesia unless specifically instructed to do so.  WOUND CARE INSTRUCTIONS (if applicable):  Keep a dry clean dressing on the anesthesia/puncture wound site if there is drainage.  Once the wound has quit draining you may leave it open to air.  Generally you should  leave the bandage intact for twenty four hours unless there is drainage.  If the epidural site drains for more than 36-48 hours please call the anesthesia department.  QUESTIONS?:  Please feel free to call your physician or the hospital operator if you have any questions, and they will be happy to assist you.     [place] [phone]

## 2011-03-23 ENCOUNTER — Encounter (HOSPITAL_COMMUNITY): Payer: Self-pay | Admitting: Anesthesiology

## 2011-03-23 ENCOUNTER — Ambulatory Visit (HOSPITAL_COMMUNITY)
Admission: RE | Admit: 2011-03-23 | Discharge: 2011-03-23 | Disposition: A | Discharge status: Home / Self Care | Payer: Medicare Other | Source: Ambulatory Visit | Attending: Ophthalmology | Admitting: Ophthalmology

## 2011-03-23 ENCOUNTER — Encounter (HOSPITAL_COMMUNITY)
Admission: RE | Disposition: A | Discharge status: Home / Self Care | Payer: Self-pay | Source: Ambulatory Visit | Attending: Ophthalmology

## 2011-03-23 ENCOUNTER — Encounter (HOSPITAL_COMMUNITY): Payer: Self-pay | Admitting: Ophthalmology

## 2011-03-23 DIAGNOSIS — Z7982 Long term (current) use of aspirin: Secondary | ICD-10-CM | POA: Insufficient documentation

## 2011-03-23 DIAGNOSIS — H2510 Age-related nuclear cataract, unspecified eye: Secondary | ICD-10-CM | POA: Insufficient documentation

## 2011-03-23 DIAGNOSIS — I10 Essential (primary) hypertension: Secondary | ICD-10-CM | POA: Insufficient documentation

## 2011-03-23 DIAGNOSIS — E119 Type 2 diabetes mellitus without complications: Secondary | ICD-10-CM | POA: Insufficient documentation

## 2011-03-23 DIAGNOSIS — G4733 Obstructive sleep apnea (adult) (pediatric): Secondary | ICD-10-CM | POA: Insufficient documentation

## 2011-03-23 DIAGNOSIS — H2181 Floppy iris syndrome: Secondary | ICD-10-CM | POA: Insufficient documentation

## 2011-03-23 DIAGNOSIS — Z01812 Encounter for preprocedural laboratory examination: Secondary | ICD-10-CM | POA: Insufficient documentation

## 2011-03-23 DIAGNOSIS — Z79899 Other long term (current) drug therapy: Secondary | ICD-10-CM | POA: Insufficient documentation

## 2011-03-23 HISTORY — PX: CATARACT EXTRACTION W/PHACO: SHX586

## 2011-03-23 LAB — GLUCOSE, CAPILLARY: Glucose-Capillary: 118 mg/dL — ABNORMAL HIGH (ref 70–99)

## 2011-03-23 SURGERY — PHACOEMULSIFICATION, CATARACT, WITH IOL INSERTION
Anesthesia: Monitor Anesthesia Care | Site: Eye | Laterality: Left | Wound class: Clean

## 2011-03-23 MED ORDER — LIDOCAINE HCL 3.5 % OP GEL
OPHTHALMIC | Status: AC
  Filled 2011-03-23: qty 5

## 2011-03-23 MED ORDER — PHENYLEPHRINE HCL 2.5 % OP SOLN
1.0000 [drp] | OPHTHALMIC | Status: AC
  Administered 2011-03-23 (×3): 1 [drp] via OPHTHALMIC

## 2011-03-23 MED ORDER — MIDAZOLAM HCL 2 MG/2ML IJ SOLN
1.0000 mg | INTRAMUSCULAR | Status: DC | PRN
  Administered 2011-03-23: 2 mg via INTRAVENOUS

## 2011-03-23 MED ORDER — LIDOCAINE HCL 3.5 % OP GEL: 1.0000 "application " | Freq: Once | OPHTHALMIC | Status: DC

## 2011-03-23 MED ORDER — MIDAZOLAM HCL 2 MG/2ML IJ SOLN
INTRAMUSCULAR | Status: AC
  Administered 2011-03-23: 2 mg via INTRAVENOUS
  Filled 2011-03-23: qty 2

## 2011-03-23 MED ORDER — NEOMYCIN-POLYMYXIN-DEXAMETH 3.5-10000-0.1 OP OINT
TOPICAL_OINTMENT | OPHTHALMIC | Status: AC
  Filled 2011-03-23: qty 3.5

## 2011-03-23 MED ORDER — LIDOCAINE HCL (PF) 1 % IJ SOLN
INTRAMUSCULAR | Status: AC
  Filled 2011-03-23: qty 2

## 2011-03-23 MED ORDER — BSS IO SOLN
INTRAOCULAR | Status: DC | PRN
  Administered 2011-03-23: 15 mL via INTRAOCULAR

## 2011-03-23 MED ORDER — LIDOCAINE 3.5 % OP GEL OPTIME - NO CHARGE
OPHTHALMIC | Status: DC | PRN
  Administered 2011-03-23: 1 [drp] via OPHTHALMIC

## 2011-03-23 MED ORDER — CYCLOPENTOLATE-PHENYLEPHRINE 0.2-1 % OP SOLN
OPHTHALMIC | Status: AC
  Administered 2011-03-23: 1 [drp] via OPHTHALMIC
  Filled 2011-03-23: qty 2

## 2011-03-23 MED ORDER — PROVISC 10 MG/ML IO SOLN
INTRAOCULAR | Status: DC | PRN
  Administered 2011-03-23: 8.5 mg via INTRAOCULAR

## 2011-03-23 MED ORDER — TETRACAINE HCL 0.5 % OP SOLN
1.0000 [drp] | OPHTHALMIC | Status: AC
  Administered 2011-03-23 (×3): 1 [drp] via OPHTHALMIC

## 2011-03-23 MED ORDER — EPINEPHRINE HCL 1 MG/ML IJ SOLN
INTRAOCULAR | Status: DC | PRN
  Administered 2011-03-23: 09:00:00

## 2011-03-23 MED ORDER — LACTATED RINGERS IV SOLN: INTRAVENOUS | Status: DC

## 2011-03-23 MED ORDER — EPINEPHRINE HCL 1 MG/ML IJ SOLN
INTRAMUSCULAR | Status: AC
  Filled 2011-03-23: qty 1

## 2011-03-23 MED ORDER — NEOMYCIN-POLYMYXIN-DEXAMETH 0.1 % OP OINT
TOPICAL_OINTMENT | OPHTHALMIC | Status: DC | PRN
  Administered 2011-03-23: 1 via OPHTHALMIC

## 2011-03-23 MED ORDER — POVIDONE-IODINE 5 % OP SOLN
OPHTHALMIC | Status: DC | PRN
  Administered 2011-03-23: 1 via OPHTHALMIC

## 2011-03-23 MED ORDER — TETRACAINE HCL 0.5 % OP SOLN
OPHTHALMIC | Status: AC
  Administered 2011-03-23: 1 [drp] via OPHTHALMIC
  Filled 2011-03-23: qty 2

## 2011-03-23 MED ORDER — PHENYLEPHRINE HCL 2.5 % OP SOLN
OPHTHALMIC | Status: AC
  Administered 2011-03-23: 1 [drp] via OPHTHALMIC
  Filled 2011-03-23: qty 2

## 2011-03-23 MED ORDER — LACTATED RINGERS IV SOLN
INTRAVENOUS | Status: DC
  Administered 2011-03-23: 09:00:00 via INTRAVENOUS

## 2011-03-23 MED ORDER — LIDOCAINE HCL (PF) 1 % IJ SOLN
INTRAOCULAR | Status: DC | PRN
  Administered 2011-03-23: 09:00:00 via OPHTHALMIC

## 2011-03-23 MED ORDER — CYCLOPENTOLATE-PHENYLEPHRINE 0.2-1 % OP SOLN
1.0000 [drp] | OPHTHALMIC | Status: AC
  Administered 2011-03-23 (×3): 1 [drp] via OPHTHALMIC

## 2011-03-23 SURGICAL SUPPLY — 32 items
CAPSULAR TENSION RING-AMO (OPHTHALMIC RELATED) IMPLANT
CLOTH BEACON ORANGE TIMEOUT ST (SAFETY) ×2 IMPLANT
DUOVISC SYSTEM (INTRAOCULAR LENS)
EYE SHIELD UNIVERSAL CLEAR (GAUZE/BANDAGES/DRESSINGS) ×2 IMPLANT
GLOVE BIO SURGEON STRL SZ 6.5 (GLOVE) ×2 IMPLANT
GLOVE BIOGEL PI IND STRL 6.5 (GLOVE) ×1 IMPLANT
GLOVE BIOGEL PI IND STRL 7.0 (GLOVE) IMPLANT
GLOVE BIOGEL PI IND STRL 7.5 (GLOVE) IMPLANT
GLOVE BIOGEL PI INDICATOR 6.5 (GLOVE) ×1
GLOVE BIOGEL PI INDICATOR 7.0 (GLOVE)
GLOVE BIOGEL PI INDICATOR 7.5 (GLOVE)
GLOVE ECLIPSE 6.5 STRL STRAW (GLOVE) ×2 IMPLANT
GLOVE ECLIPSE 7.0 STRL STRAW (GLOVE) IMPLANT
GLOVE ECLIPSE 7.5 STRL STRAW (GLOVE) IMPLANT
GLOVE EXAM NITRILE LRG STRL (GLOVE) IMPLANT
GLOVE EXAM NITRILE MD LF STRL (GLOVE) ×2 IMPLANT
GLOVE SKINSENSE NS SZ6.5 (GLOVE)
GLOVE SKINSENSE NS SZ7.0 (GLOVE)
GLOVE SKINSENSE STRL SZ6.5 (GLOVE) IMPLANT
GLOVE SKINSENSE STRL SZ7.0 (GLOVE) IMPLANT
KIT VITRECTOMY (OPHTHALMIC RELATED) IMPLANT
PAD ARMBOARD 7.5X6 YLW CONV (MISCELLANEOUS) ×2 IMPLANT
PROC W NO LENS (INTRAOCULAR LENS)
PROC W SPEC LENS (INTRAOCULAR LENS)
PROCESS W NO LENS (INTRAOCULAR LENS) IMPLANT
PROCESS W SPEC LENS (INTRAOCULAR LENS) IMPLANT
RING MALYGIN (MISCELLANEOUS) IMPLANT
SIGHTPATH CAT PROC W REG LENS (Ophthalmic Related) ×2 IMPLANT
SYR TB 1ML LL NO SAFETY (SYRINGE) ×2 IMPLANT
SYSTEM DUOVISC (INTRAOCULAR LENS) IMPLANT
VISCOELASTIC ADDITIONAL (OPHTHALMIC RELATED) IMPLANT
WATER STERILE IRR 250ML POUR (IV SOLUTION) ×2 IMPLANT

## 2011-03-23 NOTE — Anesthesia Preprocedure Evaluation (Addendum)
Anesthesia Evaluation  Patient identified by MRN, date of birth, ID band Patient awake  General Assessment Comment  Reviewed: Allergy & Precautions, H&P , NPO status , Patient's Chart, lab work & pertinent test results, reviewed documented beta blocker date and time   History of Anesthesia Complications Negative for: history of anesthetic complications  Airway Mallampati: II      Dental  (+) Edentulous Upper and Edentulous Lower   Pulmonary (+) shortness of breath and With exertion sleep apnea Current Smoker    Pulmonary exam normal       Cardiovascular hypertension, Pt. on medications + angina with exertion + Peripheral Vascular Disease and +CHF Regular Normal    Neuro/Psych    GI/Hepatic   Endo/Other  Diabetes mellitus-, Well Controlled, Type 2  Renal/GU      Musculoskeletal   Abdominal   Peds  Hematology   Anesthesia Other Findings   Reproductive/Obstetrics                          Anesthesia Physical Anesthesia Plan  ASA: III  Anesthesia Plan: MAC   Post-op Pain Management:    Induction:   Airway Management Planned: Nasal Cannula  Additional Equipment:   Intra-op Plan:   Post-operative Plan:   Informed Consent: I have reviewed the patients History and Physical, chart, labs and discussed the procedure including the risks, benefits and alternatives for the proposed anesthesia with the patient or authorized representative who has indicated his/her understanding and acceptance.     Plan Discussed with:   Anesthesia Plan Comments:         Anesthesia Quick Evaluation

## 2011-03-23 NOTE — Anesthesia Postprocedure Evaluation (Signed)
  Anesthesia Post-op Note  Patient: [name]  Procedure(s) Performed:  CATARACT EXTRACTION PHACO AND INTRAOCULAR LENS PLACEMENT (IOC) - CDE: 15.99  Patient Location:  Short Stay  Anesthesia Type: MAC  Level of Consciousness: awake  Airway and Oxygen Therapy: Patient Spontanous Breathing  Post-op Pain: none  Post-op Assessment: Post-op Vital signs reviewed, Patient's Cardiovascular Status Stable, Respiratory Function Stable, Patent Airway, No signs of Nausea or vomiting and Pain level controlled  Post-op Vital Signs: Reviewed and stable  Complications: No apparent anesthesia complications

## 2011-03-23 NOTE — Brief Op Note (Signed)
Pre-Op Dx: Cataract OS Post-Op Dx: Cataract OS Surgeon: Tonny Branch Anesthesia: Topical with MAC Implant: Lenstec, Model Softec HD Specimen: None Complications: None

## 2011-03-23 NOTE — Anesthesia Procedure Notes (Signed)
Procedure Name: MAC Date/Time: 03/23/2011 9:04 AM Performed by: Drucie Opitz Pre-anesthesia Checklist: Patient identified, Patient being monitored, Emergency Drugs available, Timeout performed and Suction available Patient Re-evaluated:Patient Re-evaluated prior to inductionOxygen Delivery Method: Nasal Cannula

## 2011-03-23 NOTE — Transfer of Care (Signed)
Immediate Anesthesia Transfer of Care Note  Patient: [name]  Procedure(s) Performed:  CATARACT EXTRACTION PHACO AND INTRAOCULAR LENS PLACEMENT (IOC) - CDE: 15.99  Patient Location: Shortstay  Anesthesia Type: MAC  Level of Consciousness: awake  Airway & Oxygen Therapy: Patient Spontanous Breathing   Post-op Assessment: Report given to PACU RN, Post -op Vital signs reviewed and stable and Patient moving all extremities  Post vital signs: Reviewed and stable  Complications: No apparent anesthesia complications

## 2011-03-23 NOTE — H&P (Signed)
I have reviewed the H&P, the patient was re-examined, and I have identified no interval changes in medical condition and plan of care since the history and physical of record  

## 2011-03-24 ENCOUNTER — Other Ambulatory Visit: Payer: Self-pay | Admitting: Cardiology

## 2011-03-24 NOTE — Telephone Encounter (Signed)
Attempted to reach pt regarding dose of Isosorbide. No answer at home number. Cell phone number is "wrong number."

## 2011-03-24 NOTE — Op Note (Signed)
NAMEFRISCO, STAIGER NO.:  [id number]  MEDICAL RECORD NO.:  [id number]  LOCATION:  [facility]PO                          FACILITY:  [facility]  PHYSICIAN:  Richardo Hanks, MD       DATE OF BIRTH:  04-26-1946  DATE OF PROCEDURE:  03/23/2011 DATE OF DISCHARGE:  03/23/2011                              OPERATIVE REPORT   PREOPERATIVE DIAGNOSIS:  Nuclear cataract, left eye.  POSTOPERATIVE DIAGNOSIS:  Nuclear cataract, left eye.  DIAGNOSIS CODE:  366.16.  ADDITIONAL POSTOPERATIVE DIAGNOSIS:  Intraoperative floppy iris syndrome, left eye, diagnosis code 364.81.  OPERATION PERFORMED:  Phacoemulsification with posterior chamber intraocular lens implantation, left eye.  SURGEON:  Richardo Hanks, MD  ANESTHESIA:  General endotracheal anesthesia.  OPERATIVE SUMMARY:  In the preoperative area, dilating drops were placed into the left eye.  The patient was then brought into the operating room where he was placed under general anesthesia.  The eye was then prepped and draped.  Beginning with a 75 blade, a paracentesis port was made at the surgeon's 2 o'clock position.  The anterior chamber was then filled with a 1% nonpreserved lidocaine solution with epinephrine.  This was followed by Viscoat to deepen the chamber.  A small fornix-based peritomy was performed superiorly.  Next, a single iris hook was placed through the limbus superiorly.  A 2.4-mm keratome blade was then used to make a clear corneal incision over the iris hook.  A bent cystotome needle and Utrata forceps were used to create a continuous tear capsulotomy.  Hydrodissection was performed using balanced salt solution on a fine cannula.  The lens nucleus was then removed using phacoemulsification in a quadrant cracking technique.  The cortical material was then removed with irrigation and aspiration.  The capsular bag and anterior chamber were refilled with Provisc.  The wound was widened to approximately 3 mm and a  posterior chamber intraocular lens was placed into the capsular bag without difficulty using an [employer] lens injecting system.  A single 10-0 nylon suture was then used to close the incision as well as stromal hydration.  The Provisc was removed from the anterior chamber and capsular bag with irrigation and aspiration.  At this point, the wounds were tested for leak, which were negative.  The anterior chamber remained deep and stable.  The patient tolerated the procedure well.  There were no operative complications, and he awoke from general anesthesia without problem.  No surgical specimens.  Prosthetic device used is a Lenstec posterior chamber lens, model Softec HD, power of 19.75, serial number is [id number].          ______________________________ Richardo Hanks, MD     KEH/MEDQ  D:  03/23/2011  T:  03/24/2011  Job:  [id number]

## 2011-03-29 ENCOUNTER — Encounter (HOSPITAL_COMMUNITY): Payer: Self-pay | Admitting: Ophthalmology

## 2011-03-30 NOTE — Telephone Encounter (Signed)
Left message on voicemail of daughter (Missy), to call office r/e imdur 120mg ,since unable to reach patient and still had no return call from patient.

## 2011-04-05 ENCOUNTER — Encounter (HOSPITAL_COMMUNITY): Payer: Self-pay | Admitting: Pharmacy Technician

## 2011-04-11 ENCOUNTER — Encounter (HOSPITAL_COMMUNITY): Payer: Self-pay

## 2011-04-11 ENCOUNTER — Encounter (HOSPITAL_COMMUNITY)
Admission: RE | Admit: 2011-04-11 | Discharge: 2011-04-11 | Payer: Medicare Other | Source: Ambulatory Visit | Admitting: Ophthalmology

## 2011-04-11 NOTE — Patient Instructions (Signed)
[place]  04/11/2011   Your procedure is scheduled on:  04/17/2011  Report to [facility] at 0830 AM.  Call this number if you have problems the morning of surgery: [phone]   Remember:   Do not eat food:After Midnight.  Do not drink clear liquids: After Midnight.  Take these medicines the morning of surgery with A SIP OF WATER: carvedilol, isosorbide   Do not wear jewelry, make-up or nail polish.  Do not wear lotions, powders, or perfumes. You may wear deodorant.  Do not shave 48 hours prior to surgery.  Do not bring valuables to the hospital.  Contacts, dentures or bridgework may not be worn into surgery.  Leave suitcase in the car. After surgery it may be brought to your room.  For patients admitted to the hospital, checkout time is 11:00 AM the day of discharge.   Patients discharged the day of surgery will not be allowed to drive home.  Name and phone number of your driver:   Special Instructions: N/A   Please read over the following fact sheets that you were given: Pain Booklet, Anesthesia Post-op Instructions and Care and Recovery After Surgery

## 2011-04-17 ENCOUNTER — Encounter (HOSPITAL_COMMUNITY)
Admission: RE | Disposition: A | Discharge status: Home / Self Care | Payer: Self-pay | Source: Ambulatory Visit | Attending: Ophthalmology

## 2011-04-17 ENCOUNTER — Ambulatory Visit (HOSPITAL_COMMUNITY): Payer: Medicare Other | Admitting: Anesthesiology

## 2011-04-17 ENCOUNTER — Encounter (HOSPITAL_COMMUNITY): Payer: Self-pay | Admitting: Anesthesiology

## 2011-04-17 ENCOUNTER — Ambulatory Visit (HOSPITAL_COMMUNITY)
Admission: RE | Admit: 2011-04-17 | Discharge: 2011-04-17 | Disposition: A | Discharge status: Home / Self Care | Payer: Medicare Other | Source: Ambulatory Visit | Attending: Ophthalmology | Admitting: Ophthalmology

## 2011-04-17 ENCOUNTER — Encounter (HOSPITAL_COMMUNITY): Payer: Self-pay | Admitting: *Deleted

## 2011-04-17 DIAGNOSIS — G4733 Obstructive sleep apnea (adult) (pediatric): Secondary | ICD-10-CM | POA: Insufficient documentation

## 2011-04-17 DIAGNOSIS — Z01812 Encounter for preprocedural laboratory examination: Secondary | ICD-10-CM | POA: Insufficient documentation

## 2011-04-17 DIAGNOSIS — Z79899 Other long term (current) drug therapy: Secondary | ICD-10-CM | POA: Insufficient documentation

## 2011-04-17 DIAGNOSIS — I10 Essential (primary) hypertension: Secondary | ICD-10-CM | POA: Insufficient documentation

## 2011-04-17 DIAGNOSIS — H2510 Age-related nuclear cataract, unspecified eye: Secondary | ICD-10-CM | POA: Insufficient documentation

## 2011-04-17 HISTORY — PX: CATARACT EXTRACTION W/PHACO: SHX586

## 2011-04-17 LAB — GLUCOSE, CAPILLARY: Glucose-Capillary: 103 mg/dL — ABNORMAL HIGH (ref 70–99)

## 2011-04-17 SURGERY — PHACOEMULSIFICATION, CATARACT, WITH IOL INSERTION
Anesthesia: Monitor Anesthesia Care | Site: Eye | Laterality: Right | Wound class: Clean

## 2011-04-17 MED ORDER — MIDAZOLAM HCL 2 MG/2ML IJ SOLN
1.0000 mg | INTRAMUSCULAR | Status: DC | PRN
  Administered 2011-04-17: 2 mg via INTRAVENOUS

## 2011-04-17 MED ORDER — ONDANSETRON HCL 4 MG/2ML IJ SOLN: 4.0000 mg | Freq: Once | INTRAMUSCULAR | Status: DC | PRN

## 2011-04-17 MED ORDER — TETRACAINE HCL 0.5 % OP SOLN
OPHTHALMIC | Status: AC
  Filled 2011-04-17: qty 2

## 2011-04-17 MED ORDER — NEOMYCIN-POLYMYXIN-DEXAMETH 3.5-10000-0.1 OP OINT
TOPICAL_OINTMENT | OPHTHALMIC | Status: AC
  Filled 2011-04-17: qty 3.5

## 2011-04-17 MED ORDER — LIDOCAINE 3.5 % OP GEL OPTIME - NO CHARGE
OPHTHALMIC | Status: DC | PRN
  Administered 2011-04-17: 1 [drp] via OPHTHALMIC

## 2011-04-17 MED ORDER — CYCLOPENTOLATE-PHENYLEPHRINE 0.2-1 % OP SOLN
1.0000 [drp] | OPHTHALMIC | Status: AC
  Administered 2011-04-17 (×3): 1 [drp] via OPHTHALMIC

## 2011-04-17 MED ORDER — CYCLOPENTOLATE-PHENYLEPHRINE 0.2-1 % OP SOLN
OPHTHALMIC | Status: AC
  Filled 2011-04-17: qty 2

## 2011-04-17 MED ORDER — PHENYLEPHRINE HCL 2.5 % OP SOLN
1.0000 [drp] | OPHTHALMIC | Status: AC
  Administered 2011-04-17 (×3): 1 [drp] via OPHTHALMIC

## 2011-04-17 MED ORDER — TETRACAINE HCL 0.5 % OP SOLN
1.0000 [drp] | OPHTHALMIC | Status: AC
  Administered 2011-04-17 (×3): 1 [drp] via OPHTHALMIC

## 2011-04-17 MED ORDER — EPINEPHRINE HCL 1 MG/ML IJ SOLN
INTRAMUSCULAR | Status: AC
  Filled 2011-04-17: qty 1

## 2011-04-17 MED ORDER — MIDAZOLAM HCL 2 MG/2ML IJ SOLN
INTRAMUSCULAR | Status: AC
  Filled 2011-04-17: qty 2

## 2011-04-17 MED ORDER — LIDOCAINE HCL (PF) 1 % IJ SOLN
INTRAMUSCULAR | Status: AC
  Filled 2011-04-17: qty 2

## 2011-04-17 MED ORDER — FENTANYL CITRATE 0.05 MG/ML IJ SOLN: 25.0000 ug | INTRAMUSCULAR | Status: DC | PRN

## 2011-04-17 MED ORDER — LIDOCAINE HCL 3.5 % OP GEL
1.0000 "application " | Freq: Once | OPHTHALMIC | Status: AC
  Administered 2011-04-17: 1 via OPHTHALMIC

## 2011-04-17 MED ORDER — LIDOCAINE HCL (PF) 1 % IJ SOLN
INTRAOCULAR | Status: DC | PRN
  Administered 2011-04-17: 10:00:00 via OPHTHALMIC

## 2011-04-17 MED ORDER — POVIDONE-IODINE 5 % OP SOLN
OPHTHALMIC | Status: DC | PRN
  Administered 2011-04-17: 1 via OPHTHALMIC

## 2011-04-17 MED ORDER — NEOMYCIN-POLYMYXIN-DEXAMETH 0.1 % OP OINT
TOPICAL_OINTMENT | OPHTHALMIC | Status: DC | PRN
  Administered 2011-04-17: 1 via OPHTHALMIC

## 2011-04-17 MED ORDER — PHENYLEPHRINE HCL 2.5 % OP SOLN
OPHTHALMIC | Status: AC
  Filled 2011-04-17: qty 2

## 2011-04-17 MED ORDER — LACTATED RINGERS IV SOLN
INTRAVENOUS | Status: DC
  Administered 2011-04-17: 09:00:00 via INTRAVENOUS

## 2011-04-17 MED ORDER — LIDOCAINE HCL 3.5 % OP GEL
OPHTHALMIC | Status: AC
  Filled 2011-04-17: qty 5

## 2011-04-17 MED ORDER — BSS IO SOLN
INTRAOCULAR | Status: DC | PRN
  Administered 2011-04-17: 15 mL via INTRAOCULAR

## 2011-04-17 MED ORDER — PROVISC 10 MG/ML IO SOLN
INTRAOCULAR | Status: DC | PRN
  Administered 2011-04-17: 8.5 mg via INTRAOCULAR

## 2011-04-17 MED ORDER — EPINEPHRINE HCL 1 MG/ML IJ SOLN
INTRAOCULAR | Status: DC | PRN
  Administered 2011-04-17: 10:00:00

## 2011-04-17 SURGICAL SUPPLY — 31 items
CAPSULAR TENSION RING-AMO (OPHTHALMIC RELATED) IMPLANT
CLOTH BEACON ORANGE TIMEOUT ST (SAFETY) ×2 IMPLANT
EYE SHIELD UNIVERSAL CLEAR (GAUZE/BANDAGES/DRESSINGS) ×2 IMPLANT
GLOVE BIO SURGEON STRL SZ 6.5 (GLOVE) IMPLANT
GLOVE BIOGEL PI IND STRL 6.5 (GLOVE) IMPLANT
GLOVE BIOGEL PI IND STRL 7.0 (GLOVE) ×1 IMPLANT
GLOVE BIOGEL PI IND STRL 7.5 (GLOVE) IMPLANT
GLOVE BIOGEL PI INDICATOR 6.5 (GLOVE)
GLOVE BIOGEL PI INDICATOR 7.0 (GLOVE) ×1
GLOVE BIOGEL PI INDICATOR 7.5 (GLOVE)
GLOVE ECLIPSE 6.5 STRL STRAW (GLOVE) IMPLANT
GLOVE ECLIPSE 7.0 STRL STRAW (GLOVE) IMPLANT
GLOVE ECLIPSE 7.5 STRL STRAW (GLOVE) IMPLANT
GLOVE EXAM NITRILE LRG STRL (GLOVE) IMPLANT
GLOVE EXAM NITRILE MD LF STRL (GLOVE) ×2 IMPLANT
GLOVE SKINSENSE NS SZ6.5 (GLOVE)
GLOVE SKINSENSE NS SZ7.0 (GLOVE)
GLOVE SKINSENSE STRL SZ6.5 (GLOVE) IMPLANT
GLOVE SKINSENSE STRL SZ7.0 (GLOVE) IMPLANT
KIT VITRECTOMY (OPHTHALMIC RELATED) IMPLANT
PAD ARMBOARD 7.5X6 YLW CONV (MISCELLANEOUS) ×2 IMPLANT
PROC W NO LENS (INTRAOCULAR LENS)
PROC W SPEC LENS (INTRAOCULAR LENS)
PROCESS W NO LENS (INTRAOCULAR LENS) IMPLANT
PROCESS W SPEC LENS (INTRAOCULAR LENS) IMPLANT
RING MALYGIN (MISCELLANEOUS) IMPLANT
SIGHTPATH CAT PROC W REG LENS (Ophthalmic Related) ×2 IMPLANT
SYR TB 1ML LL NO SAFETY (SYRINGE) ×2 IMPLANT
TAPE TRANSPARENT 1/2IN (GAUZE/BANDAGES/DRESSINGS) ×2 IMPLANT
VISCOELASTIC ADDITIONAL (OPHTHALMIC RELATED) IMPLANT
WATER STERILE IRR 250ML POUR (IV SOLUTION) ×2 IMPLANT

## 2011-04-17 NOTE — Anesthesia Preprocedure Evaluation (Signed)
Anesthesia Evaluation  Patient identified by MRN, date of birth, ID band Patient awake    Reviewed: Allergy & Precautions, H&P , NPO status , Patient's Chart, lab work & pertinent test results, reviewed documented beta blocker date and time   History of Anesthesia Complications Negative for: history of anesthetic complications  Airway Mallampati: II      Dental  (+) Edentulous Upper and Edentulous Lower   Pulmonary shortness of breath and with exertion, sleep apnea , Current Smoker,    Pulmonary exam normal       Cardiovascular hypertension, Pt. on medications + angina with exertion + Peripheral Vascular Disease and +CHF Regular Normal    Neuro/Psych    GI/Hepatic   Endo/Other  Diabetes mellitus-, Well Controlled, Type 2  Renal/GU      Musculoskeletal   Abdominal   Peds  Hematology   Anesthesia Other Findings   Reproductive/Obstetrics                           Anesthesia Physical Anesthesia Plan  ASA: III  Anesthesia Plan: MAC   Post-op Pain Management:    Induction: Intravenous  Airway Management Planned: Nasal Cannula  Additional Equipment:   Intra-op Plan:   Post-operative Plan:   Informed Consent: I have reviewed the patients History and Physical, chart, labs and discussed the procedure including the risks, benefits and alternatives for the proposed anesthesia with the patient or authorized representative who has indicated his/her understanding and acceptance.     Plan Discussed with:   Anesthesia Plan Comments:         Anesthesia Quick Evaluation

## 2011-04-17 NOTE — Transfer of Care (Signed)
Immediate Anesthesia Transfer of Care Note  Patient: [name]  Procedure(s) Performed:  CATARACT EXTRACTION PHACO AND INTRAOCULAR LENS PLACEMENT (IOC) - CDE: 23.45  Patient Location: Shortstay  Anesthesia Type: MAC  Level of Consciousness: awake  Airway & Oxygen Therapy: Patient Spontanous Breathing   Post-op Assessment: Report given to PACU RN, Post -op Vital signs reviewed and stable and Patient moving all extremities  Post vital signs: Reviewed and stable  Complications: No apparent anesthesia complications

## 2011-04-17 NOTE — Anesthesia Procedure Notes (Addendum)
Procedure Name: MAC Performed by: Drucie Opitz Pre-anesthesia Checklist: Patient identified, Timeout performed, Patient being monitored, Emergency Drugs available and Suction available Patient Re-evaluated:Patient Re-evaluated prior to inductionOxygen Delivery Method: Nasal Cannula   Date/Time: 04/17/2011 9:54 AM Performed by: Drucie Opitz

## 2011-04-17 NOTE — Op Note (Signed)
NAMEPADRAIC, SWEDLUND NO.:  [id number]  MEDICAL RECORD NO.:  [id number]  LOCATION:  [facility]PO                          FACILITY:  [facility]  PHYSICIAN:  Richardo Hanks, MD       DATE OF BIRTH:  [birth year]  DATE OF PROCEDURE:  04/17/2011 DATE OF DISCHARGE:  04/17/2011                              OPERATIVE REPORT   PREOPERATIVE DIAGNOSIS:  Nuclear cataract.  POSTOPERATIVE DIAGNOSIS:  Nuclear cataract.  DIAGNOSIS CODE:  366.16.  OPERATION PERFORMED:  Phacoemulsification with posterior chamber intraocular lens implantation, right eye.  SURGEON:  Franky Macho. Anastazia Creek, MD  ANESTHESIA:  General endotracheal anesthesia.  OPERATIVE SUMMARY:  In the preoperative area, dilating drops were placed into the right eye.  The patient was then brought into the operating room where he was placed under general anesthesia.  The eye was then prepped and draped.  Beginning with a 75 blade, a paracentesis port was made at the surgeon's 2 o'clock position.  The anterior chamber was then filled with a 1% nonpreserved lidocaine solution with epinephrine.  This was followed by Viscoat to deepen the chamber.  A small fornix-based peritomy was performed superiorly.  Next, a single iris hook was placed through the limbus superiorly.  A 2.4-mm keratome blade was then used to make a clear corneal incision over the iris hook.  A bent cystotome needle and Utrata forceps were used to create a continuous tear capsulotomy.  Hydrodissection was performed using balanced salt solution on a fine cannula.  The lens nucleus was then removed using phacoemulsification in a quadrant cracking technique.  The cortical material was then removed with irrigation and aspiration.  The capsular bag and anterior chamber were refilled with Provisc.  The wound was widened to approximately 3 mm and a posterior chamber intraocular lens was placed into the capsular bag without difficulty using an [employer] lens injecting  system.  A single 10-0 nylon suture was then used to close the incision as well as stromal hydration.  The Provisc was removed from the anterior chamber and capsular bag with irrigation and aspiration.  At this point, the wounds were tested for leak, which were negative.  The anterior chamber remained deep and stable.  The patient tolerated the procedure well.  There were no operative complications, and he awoke from general anesthesia without problem.  No surgical specimens.  Prosthetic device used is a Lenstec posterior chamber lens, model Softec HD, power of 17.0, serial number is [id number].          ______________________________ Richardo Hanks, MD     KEH/MEDQ  D:  04/17/2011  T:  04/17/2011  Job:  [id number]

## 2011-04-17 NOTE — H&P (Signed)
I have reviewed the H&P, the patient was re-examined, and I have identified no interval changes in medical condition and plan of care since the history and physical of record  

## 2011-04-17 NOTE — Anesthesia Postprocedure Evaluation (Signed)
  Anesthesia Post-op Note  Patient: [name]  Procedure(s) Performed:  CATARACT EXTRACTION PHACO AND INTRAOCULAR LENS PLACEMENT (IOC) - CDE: 23.45  Patient Location:  Short Stay  Anesthesia Type: MAC  Level of Consciousness: awake  Airway and Oxygen Therapy: Patient Spontanous Breathing  Post-op Pain: none  Post-op Assessment: Post-op Vital signs reviewed, Patient's Cardiovascular Status Stable, Respiratory Function Stable, Patent Airway, No signs of Nausea or vomiting and Pain level controlled  Post-op Vital Signs: Reviewed and stable  Complications: No apparent anesthesia complications

## 2011-04-17 NOTE — Brief Op Note (Signed)
Pre-Op Dx: Cataract OD Post-Op Dx: Cataract OD Surgeon: Icie Kuznicki Anesthesia: Topical with MAC Implant: Lenstec, Model Softec HD Blood Loss: None Specimen: None Complications: None 

## 2011-04-21 ENCOUNTER — Encounter (HOSPITAL_COMMUNITY): Payer: Self-pay | Admitting: Ophthalmology

## 2011-05-25 ENCOUNTER — Other Ambulatory Visit: Payer: Self-pay | Admitting: Cardiology

## 2011-05-26 ENCOUNTER — Encounter: Payer: Self-pay | Admitting: Cardiology

## 2011-05-26 ENCOUNTER — Ambulatory Visit (INDEPENDENT_AMBULATORY_CARE_PROVIDER_SITE_OTHER): Payer: Medicare Other | Admitting: Cardiology

## 2011-05-26 ENCOUNTER — Encounter: Payer: Self-pay | Admitting: *Deleted

## 2011-05-26 VITALS — BP 108/62 | HR 65 | Ht 73.0 in | Wt 260.0 lb

## 2011-05-26 DIAGNOSIS — Z951 Presence of aortocoronary bypass graft: Secondary | ICD-10-CM

## 2011-05-26 DIAGNOSIS — I255 Ischemic cardiomyopathy: Secondary | ICD-10-CM

## 2011-05-26 DIAGNOSIS — I714 Abdominal aortic aneurysm, without rupture: Secondary | ICD-10-CM

## 2011-05-26 DIAGNOSIS — I2589 Other forms of chronic ischemic heart disease: Secondary | ICD-10-CM

## 2011-05-26 DIAGNOSIS — I2510 Atherosclerotic heart disease of native coronary artery without angina pectoris: Secondary | ICD-10-CM | POA: Diagnosis not present

## 2011-05-26 DIAGNOSIS — T783XXA Angioneurotic edema, initial encounter: Secondary | ICD-10-CM

## 2011-05-26 DIAGNOSIS — I739 Peripheral vascular disease, unspecified: Secondary | ICD-10-CM | POA: Diagnosis not present

## 2011-05-26 DIAGNOSIS — I208 Other forms of angina pectoris: Secondary | ICD-10-CM

## 2011-05-26 NOTE — Progress Notes (Signed)
Patient ID: [name], male   DOB: 1945-07-09, 66 y.o.   MRN: [id number]  Will do cholesterol labs in February.  Recently stopped Norvasc & increased Simvastatin.  Was told to do labs 3 months after this time.

## 2011-05-26 NOTE — Patient Instructions (Signed)
   Echo  ABI's If the results of your test are normal or stable, you will receive a letter.  If they are abnormal, the nurse will contact you by phone. Your physician wants you to follow up in: 6 months.  You will receive a reminder letter in the mail one-two months in advance.  If you don't receive a letter, please call our office to schedule the follow up appointment

## 2011-06-05 ENCOUNTER — Encounter: Payer: Self-pay | Admitting: Cardiology

## 2011-06-05 DIAGNOSIS — I714 Abdominal aortic aneurysm, without rupture: Secondary | ICD-10-CM | POA: Insufficient documentation

## 2011-06-05 DIAGNOSIS — Z951 Presence of aortocoronary bypass graft: Secondary | ICD-10-CM | POA: Insufficient documentation

## 2011-06-05 DIAGNOSIS — I779 Disorder of arteries and arterioles, unspecified: Secondary | ICD-10-CM | POA: Insufficient documentation

## 2011-06-05 DIAGNOSIS — I208 Other forms of angina pectoris: Secondary | ICD-10-CM | POA: Insufficient documentation

## 2011-06-05 DIAGNOSIS — I255 Ischemic cardiomyopathy: Secondary | ICD-10-CM | POA: Insufficient documentation

## 2011-06-05 DIAGNOSIS — T783XXA Angioneurotic edema, initial encounter: Secondary | ICD-10-CM | POA: Insufficient documentation

## 2011-06-05 NOTE — Progress Notes (Signed)
Salli Real, MD, [facility] ABIM Board Certified in Adult Cardiovascular Medicine,Internal Medicine and Critical Care Medicine    CC: followup patient with a history of ischemic cardio myopathy  HPI:  The patient is a 66 year old male with a history of ischemic cardio myopathy, ejection fraction 30-35%.  He status post coronary bypass grafting.  He has chronic stable angina with a long lesion in the circumflex coronary artery that is not amenable to percutaneous coronary intervention.  His angina is well controlled with isosorbide mononitrate.  The patient reports that on one occasion when he forgot to take his isosorbide mononitrate he developed crippling anginal on minimal exertion.  He complains also of bilateral peripheral neuropathy, although he has significant disease and claudication in the right lower extremity in addition.  The patient states that he has no energy and is lacking stamina.  He did gain 13 pounds.  He does appear to be more short of breath on minimal exertion, however.  He also reports frequent palpitations not associated with presyncope, or syncope.  There is also no associated dizziness. The patient has a significant infrarenal aneurysm for which she is followed.  Last dimension 6 months ago or 4.8 cm.  PMH: reviewed and listed in Problem List in Electronic Records (and see below) Past Medical History  Diagnosis Date  . Exertional angina   . Ischemic cardiomyopathy     ejection fraction 30-35% post bypass.  Cardiac catheterization with moderate LV dysfunction in 2009.  Vein graft to the diagonal and intermediate is intact, large internal mammary and distal right is fairly well collateralized.  Lung lesion in the circumflex coronary artery with a marginal that is jeopardized nonfavorable for coronary intervention.  . Palpitations   . Angioedema     secondary to [facility] E inhibitor therapy  . Bell palsy   . Diabetes mellitus   . AAA (abdominal aortic aneurysm)     CT  angiogram, abdomen, pelvis, June 2012, stable 4.8 cm infrarenal fusiform abdominal aortic aneurysm.  The aneurysm extending into the right common iliac artery 3.1 cm in diameter.  Stable nonspecific left external iliac and inguinal adenopathy.  ABI 0.5 on the right side.  Marland Kitchen HTN (hypertension)   . PAD (peripheral artery disease)     status post peripheral angiogram.  May 2011 with infrarenal abdominal aortic aneurysm and total occlusion of the right superficial femoral artery with reconstitution in the below the knee popliteal artery.  Recommendation to be managed conservatively.  . Sleep apnea   . Kidney stones   . Arthritis   . Carotid artery disease     carotid DopplersOctober 2012, 0-39% bilateral ICA stenosis.  . Status post coronary artery bypass grafting    Past Surgical History  Procedure Date  . Coronary artery bypass graft     EF 30-35%  . Hand surgery 2009  . Cataract extraction w/phaco 03/23/2011    Procedure: CATARACT EXTRACTION PHACO AND INTRAOCULAR LENS PLACEMENT (IOC);  Surgeon: Tonny Branch;  Location: [facility];  Service: Ophthalmology;  Laterality: Left;  CDE: 15.99  . Cataract extraction w/phaco 04/17/2011    Procedure: CATARACT EXTRACTION PHACO AND INTRAOCULAR LENS PLACEMENT (IOC);  Surgeon: Tonny Branch;  Location: [facility];  Service: Ophthalmology;  Laterality: Right;  CDE: 23.45    Allergies/SH/FHX : available in Electronic Records for review  Allergies  Allergen Reactions  . Lisinopril Anaphylaxis and Swelling  . Codeine     REACTION: delirium   History   Social History  . Marital  Status: Divorced    Spouse Name: N/A    Number of Children: N/A  . Years of Education: N/A   Occupational History  . Not on file.   Social History Main Topics  . Smoking status: Former Smoker -- 1.0 packs/day for 25 years    Types: Cigarettes    Quit date: 01/09/2011  . Smokeless tobacco: Never Used  . Alcohol Use: No  . Drug Use: No  . Sexually Active: Not on file   Other  Topics Concern  . Not on file   Social History Narrative  . No narrative on file   Family History  Problem Relation Age of Onset  . Diabetes Neg Hx   . Hypertension Neg Hx   . Coronary artery disease Neg Hx   . Anesthesia problems Neg Hx     Medications: Current Outpatient Prescriptions  Medication Sig Dispense Refill  . allopurinol (ZYLOPRIM) 100 MG tablet Take 100 mg by mouth daily.        Marland Kitchen aspirin 81 MG tablet Take by mouth daily.        . carvedilol (COREG) 25 MG tablet Take 25 mg by mouth 2 (two) times daily.        . furosemide (LASIX) 40 MG tablet Take 40 mg by mouth 2 (two) times daily.       Marland Kitchen gabapentin (NEURONTIN) 300 MG capsule Take 1 capsule (300 mg total) by mouth 2 (two) times daily.  60 capsule  6  . glipiZIDE (GLUCOTROL) 10 MG tablet Take 10 mg by mouth 2 (two) times daily before a meal.        . indomethacin (INDOCIN) 25 MG capsule Take 25 mg by mouth daily as needed. Gout Flare Ups      . isosorbide mononitrate (IMDUR) 120 MG 24 hr tablet Take 180 mg by mouth daily.        Marland Kitchen linagliptin (TRADJENTA) 5 MG TABS tablet Take 5 mg by mouth daily.        . metFORMIN (GLUCOPHAGE) 1000 MG tablet Take 1,000 mg by mouth 2 (two) times daily with a meal.        . NITROSTAT 0.4 MG SL tablet PLACE ONE (1) TABLET UNDER TONGUE EVERY 5 MINUTES UP TO (3) DOSES AS NEEDED FOR CHEST PAIN.  25 each  3  . simvastatin (ZOCOR) 40 MG tablet Take 1 tablet (40 mg total) by mouth every evening.  30 tablet  6  . vitamin C (ASCORBIC ACID) 500 MG tablet Take 500 mg by mouth daily.          ROS: No nausea or vomiting. No fever or chills.No melena or hematochezia.No bleeding.claudication in the right leg with burning and tingling in both legs upon exertion.  Physical Exam: BP 108/62  Pulse 65  Ht 6\' 1"  (1.854 m)  Wt 260 lb (117.935 kg)  BMI 34.30 kg/m2 General:overweight white male in no distress Neck:normal carotid upstroke no carotid bruits.  No thyromegaly.  Nonnodular  thyroid. Lungs:clear breath sounds bilaterally.  No wheezing Cardiac:regular rate and rhythm.  Normal S1, S2.  No murmur, rubs or gallops Vascular:no edema.  Pulses not palpable in the right foot heat or dorsalis pedis or posterior tibial pulse.  Faint pulses on the left side. Skin:warm and dry Physcologic:normal affect  12lead ECG: Limited bedside ECHO:N/A   Patient Active Problem List  Diagnoses  . DIABETIC PERIPHERAL NEUROPATHY  . DYSLIPIDEMIA  . GOUT  . UNSPECIFIED ESSENTIAL HYPERTENSION  . CHRONIC SYSTOLIC HEART  FAILURE-no recent heart failure exacerbations  . ATHEROSLERO NATV ART EXTREM W/INTERMIT CLAUDICAT  . ANEURYSM OF ARTERY OF LOWER EXTREMITY  . CLAUDICATION-ABI 0.5 in the right leg with chronically occluded SFA seen by Dr. Burt Knack recommended medical management  . PALPITATIONS  . SHORTNESS OF BREATH  . Status post coronary artery bypass grafting  . AAA (abdominal aortic aneurysm)-4.8 cm in June 2012  . Angioedema-secondary to Ace inhibitors  . Ischemic cardiomyopathy-status post bypass surgery, ejection fraction 30-35%  . Exertional angina-pattern is stable  . Carotid artery disease-minimal carotid disease.  0-39%    PLAN   Patient has increased complaints of claudication.  He had an angiogram in 2011 and he has an occluded SFA on the right side with an ABI of 0.5.  The patient has no skin breakdown or pain at rest.  His symptoms are also complicated by his diabetic peripheral neuropathy.  We will order followup ABIs to assess the severity of his symptoms and underlying disease.  Patient will need a followup echocardiogram and ejection fraction less than 35%.  He will be referred to the EP service for consideration of ICD implant.  Followup with vascular surgery regarding infrarenal aneurysm.  Patient will need followup study in 6 months.  Anginal pattern appears overall stable and will continue current medical regimen.  We'll need to consider Holter monitor for  palpitations, however ejection fraction less than 35% and patient needs ICD and this can be assessed at a later date.

## 2011-06-14 ENCOUNTER — Encounter (INDEPENDENT_AMBULATORY_CARE_PROVIDER_SITE_OTHER): Payer: Medicare Other | Admitting: *Deleted

## 2011-06-14 ENCOUNTER — Other Ambulatory Visit (INDEPENDENT_AMBULATORY_CARE_PROVIDER_SITE_OTHER): Payer: Medicare Other | Admitting: *Deleted

## 2011-06-14 DIAGNOSIS — I739 Peripheral vascular disease, unspecified: Secondary | ICD-10-CM

## 2011-06-14 DIAGNOSIS — I2589 Other forms of chronic ischemic heart disease: Secondary | ICD-10-CM | POA: Diagnosis not present

## 2011-06-14 DIAGNOSIS — I2510 Atherosclerotic heart disease of native coronary artery without angina pectoris: Secondary | ICD-10-CM | POA: Diagnosis not present

## 2011-06-14 DIAGNOSIS — Z951 Presence of aortocoronary bypass graft: Secondary | ICD-10-CM

## 2011-06-19 ENCOUNTER — Encounter: Payer: Self-pay | Admitting: *Deleted

## 2011-07-24 ENCOUNTER — Other Ambulatory Visit: Payer: Self-pay | Admitting: Cardiology

## 2011-08-23 ENCOUNTER — Other Ambulatory Visit: Payer: Self-pay | Admitting: Cardiology

## 2011-09-05 ENCOUNTER — Encounter (INDEPENDENT_AMBULATORY_CARE_PROVIDER_SITE_OTHER): Payer: Medicare Other | Admitting: *Deleted

## 2011-09-05 DIAGNOSIS — I724 Aneurysm of artery of lower extremity: Secondary | ICD-10-CM

## 2011-09-05 DIAGNOSIS — I714 Abdominal aortic aneurysm, without rupture: Secondary | ICD-10-CM

## 2011-09-05 NOTE — Procedures (Unsigned)
LOWER EXTREMITY ARTERIAL DUPLEX  INDICATION:  Follow up left popliteal aneurysm.  HISTORY: Diabetes:  Yes. Cardiac:  Yes. Hypertension:  Yes. Smoking:  Previous. Previous Surgery:  No.  SINGLE LEVEL ARTERIAL EXAM                         RIGHT                LEFT Brachial: Anterior tibial: Posterior tibial: Peroneal: Ankle/Brachial Index:  LOWER EXTREMITY ARTERIAL DUPLEX EXAM  DUPLEX:  Aneurysmal dilation of the left distal superficial femoral artery/above-knee popliteal artery measuring approximately 2.6 on today's exam.  Please see diagram for measurement details.  The right superficial femoral artery is known to be occluded and was not evaluated.  IMPRESSION:  Stable left popliteal aneurysm measuring approximately 23.6 cm on today's exam.  ___________________________________________ Rosetta Posner, M.D.  LT/MEDQ  D:  09/05/2011  T:  09/05/2011  Job:  [id number]

## 2011-09-15 ENCOUNTER — Encounter: Payer: Self-pay | Admitting: Vascular Surgery

## 2011-09-15 ENCOUNTER — Other Ambulatory Visit: Payer: Self-pay | Admitting: *Deleted

## 2011-09-15 DIAGNOSIS — I714 Abdominal aortic aneurysm, without rupture: Secondary | ICD-10-CM

## 2011-09-15 DIAGNOSIS — I724 Aneurysm of artery of lower extremity: Secondary | ICD-10-CM

## 2011-09-15 NOTE — Procedures (Unsigned)
DUPLEX ULTRASOUND OF ABDOMINAL AORTA  INDICATION:  Followup AAA  HISTORY: Diabetes:  Yes Cardiac:  Yes Hypertension:  Yes Smoking:  Previous Connective Tissue Disorder: Family History:  Yes Previous Surgery:  No  DUPLEX EXAM:         AP (cm)                   TRANSVERSE (cm) Proximal             NV                        NV Mid                  4.75 cm                   4.87 cm Distal               4.28 cm                   cm Right Iliac          2.23 cm                   cm Left Iliac           1.43 cm                   cm  PREVIOUS:  Date:  01/19/2011  AP:  4.47  TRANSVERSE:  4.54  IMPRESSION:  Technically difficult study due to bowel gas and body habitus. 1. Abdominal aortic aneurysm measuring approximately 4.8 cm on today's     exam with no significant change compared to prior exam. 2. Aneurysmal dilation of the right common iliac artery measuring     approximately 2.23 cm which was not previously visualized.  ___________________________________________ Rosetta Posner, M.D.  LT/MEDQ  D:  09/05/2011  T:  09/05/2011  Job:  [id number]

## 2011-10-24 ENCOUNTER — Other Ambulatory Visit: Payer: Self-pay | Admitting: *Deleted

## 2011-10-24 MED ORDER — SIMVASTATIN 40 MG PO TABS: 40.0000 mg | ORAL_TABLET | Freq: Every evening | ORAL | Status: DC

## 2011-11-01 DIAGNOSIS — I10 Essential (primary) hypertension: Secondary | ICD-10-CM | POA: Diagnosis not present

## 2011-11-01 DIAGNOSIS — G4730 Sleep apnea, unspecified: Secondary | ICD-10-CM | POA: Diagnosis not present

## 2011-12-07 ENCOUNTER — Other Ambulatory Visit: Payer: Self-pay

## 2011-12-07 DIAGNOSIS — I10 Essential (primary) hypertension: Secondary | ICD-10-CM | POA: Diagnosis not present

## 2011-12-07 DIAGNOSIS — E119 Type 2 diabetes mellitus without complications: Secondary | ICD-10-CM | POA: Diagnosis not present

## 2011-12-08 ENCOUNTER — Encounter: Payer: Self-pay | Admitting: Cardiovascular Disease

## 2011-12-08 ENCOUNTER — Ambulatory Visit (INDEPENDENT_AMBULATORY_CARE_PROVIDER_SITE_OTHER): Payer: Medicare Other | Admitting: Cardiovascular Disease

## 2011-12-08 VITALS — BP 133/76 | HR 85 | Ht 73.0 in | Wt 267.4 lb

## 2011-12-08 DIAGNOSIS — I5022 Chronic systolic (congestive) heart failure: Secondary | ICD-10-CM

## 2011-12-08 DIAGNOSIS — I10 Essential (primary) hypertension: Secondary | ICD-10-CM

## 2011-12-08 DIAGNOSIS — Z951 Presence of aortocoronary bypass graft: Secondary | ICD-10-CM

## 2011-12-08 DIAGNOSIS — I714 Abdominal aortic aneurysm, without rupture: Secondary | ICD-10-CM

## 2011-12-08 DIAGNOSIS — E785 Hyperlipidemia, unspecified: Secondary | ICD-10-CM

## 2011-12-08 NOTE — Assessment & Plan Note (Signed)
Most recent ejection fraction was 45-50%. He appears to be clinically stable and in [place] class II. Continue medical therapy.

## 2011-12-08 NOTE — Assessment & Plan Note (Signed)
Stable angina with no significant change in pattern.

## 2011-12-08 NOTE — Progress Notes (Signed)
HPI  The patient is a 67 year old male with a history of ischemic cardiomyopathy, previous ejection fraction 30-35% most recent ejection fraction of 40-45% in January of this year. He is status post coronary bypass grafting. He has chronic stable angina with a long lesion in the circumflex coronary artery that is not amenable to percutaneous coronary intervention. His angina is well controlled with isosorbide mononitrate. The patient has a significant infrarenal aneurysm for which she is followed by Dr. early. Last dimension was 4.8 cm recently. the patient overall has been doing well. He had few episodes of angina that responded to sublingual nitroglycerin. His dyspnea is stable. He denies orthopnea or PND. He does have chronic lower extremity edema which worsens by the end of the day. He is not on an ACE inhibitor due to previous angioedema.   Allergies  Allergen Reactions  . Lisinopril Anaphylaxis and Swelling  . Codeine     REACTION: delirium     Current Outpatient Prescriptions on File Prior to Visit  Medication Sig Dispense Refill  . allopurinol (ZYLOPRIM) 100 MG tablet Take 100 mg by mouth daily.        Marland Kitchen aspirin 81 MG tablet Take by mouth daily.        Marland Kitchen COREG 25 MG tablet TAKE (1) TABLET TWICE DAILY.  60 each  6  . furosemide (LASIX) 40 MG tablet Take 40 mg by mouth 2 (two) times daily.       Marland Kitchen glipiZIDE (GLUCOTROL) 10 MG tablet Take 10 mg by mouth 2 (two) times daily before a meal.        . indomethacin (INDOCIN) 25 MG capsule Take 25 mg by mouth daily as needed. Gout Flare Ups      . isosorbide mononitrate (IMDUR) 120 MG 24 hr tablet Take 180 mg by mouth daily.        Marland Kitchen linagliptin (TRADJENTA) 5 MG TABS tablet Take 5 mg by mouth daily.        . metFORMIN (GLUCOPHAGE) 1000 MG tablet Take 1,000 mg by mouth 2 (two) times daily with a meal.        . NEURONTIN 300 MG capsule TAKE 1 CAPSULE BY MOUTH TWICE DAILY.  30 each  0  . NITROSTAT 0.4 MG SL tablet PLACE ONE (1) TABLET UNDER  TONGUE EVERY 5 MINUTES UP TO (3) DOSES AS NEEDED FOR CHEST PAIN.  25 each  3  . simvastatin (ZOCOR) 40 MG tablet Take 1 tablet (40 mg total) by mouth every evening.  30 tablet  6     Past Medical History  Diagnosis Date  . Exertional angina   . Ischemic cardiomyopathy     ejection fraction 30-35% post bypass.  Cardiac catheterization with moderate LV dysfunction in 2009.  Vein graft to the diagonal and intermediate is intact, large internal mammary and distal right is fairly well collateralized.  Lung lesion in the circumflex coronary artery with a marginal that is jeopardized nonfavorable for coronary intervention.  . Palpitations   . Angioedema     secondary to [facility] E inhibitor therapy  . Bell palsy   . Diabetes mellitus   . AAA (abdominal aortic aneurysm)     CT angiogram, abdomen, pelvis, June 2012, stable 4.8 cm infrarenal fusiform abdominal aortic aneurysm.  The aneurysm extending into the right common iliac artery 3.1 cm in diameter.  Stable nonspecific left external iliac and inguinal adenopathy.  ABI 0.5 on the right side.  Marland Kitchen HTN (hypertension)   .  PAD (peripheral artery disease)     status post peripheral angiogram.  May 2011 with infrarenal abdominal aortic aneurysm and total occlusion of the right superficial femoral artery with reconstitution in the below the knee popliteal artery.  Recommendation to be managed conservatively.  . Sleep apnea   . Kidney stones   . Arthritis   . Carotid artery disease     carotid DopplersOctober 2012, 0-39% bilateral ICA stenosis.  . Status post coronary artery bypass grafting      Past Surgical History  Procedure Date  . Coronary artery bypass graft     EF 30-35%  . Hand surgery 2009  . Cataract extraction w/phaco 03/23/2011    Procedure: CATARACT EXTRACTION PHACO AND INTRAOCULAR LENS PLACEMENT (IOC);  Surgeon: Tonny Branch;  Location: [facility];  Service: Ophthalmology;  Laterality: Left;  CDE: 15.99  . Cataract extraction w/phaco 04/17/2011      Procedure: CATARACT EXTRACTION PHACO AND INTRAOCULAR LENS PLACEMENT (IOC);  Surgeon: Tonny Branch;  Location: [facility];  Service: Ophthalmology;  Laterality: Right;  CDE: 23.45     Family History  Problem Relation Age of Onset  . Diabetes Neg Hx   . Hypertension Neg Hx   . Coronary artery disease Neg Hx   . Anesthesia problems Neg Hx      History   Social History  . Marital Status: Divorced    Spouse Name: N/A    Number of Children: N/A  . Years of Education: N/A   Occupational History  . Not on file.   Social History Main Topics  . Smoking status: Former Smoker -- 1.0 packs/day for 25 years    Types: Cigarettes    Quit date: 01/09/2011  . Smokeless tobacco: Never Used  . Alcohol Use: No  . Drug Use: No  . Sexually Active: Not on file   Other Topics Concern  . Not on file   Social History Narrative  . No narrative on file     PHYSICAL EXAM   BP 133/76  Pulse 85  Ht 6\' 1"  (1.854 m)  Wt 267 lb 6.4 oz (121.292 kg)  BMI 35.28 kg/m2  SpO2 95%  Constitutional: He is oriented to person, place, and time. He appears well-developed and well-nourished. No distress.  HENT: No nasal discharge.  Head: Normocephalic and atraumatic.  Eyes: Pupils are equal and round. Right eye exhibits no discharge. Left eye exhibits no discharge.  Neck: Normal range of motion. Neck supple. No JVD present. No thyromegaly present.  Cardiovascular: Normal rate, regular rhythm, normal heart sounds and. Exam reveals no gallop and no friction rub. No murmur heard.  Pulmonary/Chest: Effort normal and breath sounds normal. No stridor. No respiratory distress. He has no wheezes. He has no rales. He exhibits no tenderness.  Abdominal: Soft. Bowel sounds are normal. He exhibits no distension. There is no tenderness. There is no rebound and no guarding.  Musculoskeletal: Normal range of motion. He exhibits +1 edema and no tenderness.  Neurological: He is alert and oriented to person, place, and time.  Coordination normal.  Skin: Skin is warm and dry. No rash noted. He is not diaphoretic. No erythema. No pallor.  Psychiatric: He has a normal mood and affect. His behavior is normal. Judgment and thought content normal.        ASSESSMENT AND PLAN

## 2011-12-08 NOTE — Assessment & Plan Note (Addendum)
This is being followed by Dr. early in [place]. Recent measurement was 4.8 cm. Most recent ABIs showed improvement from before.

## 2011-12-08 NOTE — Patient Instructions (Addendum)
You are doing well. Continue same medications.  Follow up in 6 months.   

## 2011-12-08 NOTE — Assessment & Plan Note (Signed)
Continue simvastatin 40 mg daily. He had labs done performed yesterday with Dr. Legrand Rams. I recommend a target LDL of less than 70.

## 2011-12-08 NOTE — Assessment & Plan Note (Signed)
His blood pressure is well controlled. Continue current medications.

## 2011-12-25 DIAGNOSIS — E669 Obesity, unspecified: Secondary | ICD-10-CM | POA: Diagnosis not present

## 2011-12-25 DIAGNOSIS — I10 Essential (primary) hypertension: Secondary | ICD-10-CM | POA: Diagnosis not present

## 2011-12-25 DIAGNOSIS — E119 Type 2 diabetes mellitus without complications: Secondary | ICD-10-CM | POA: Diagnosis not present

## 2012-02-22 DIAGNOSIS — R5383 Other fatigue: Secondary | ICD-10-CM | POA: Diagnosis not present

## 2012-02-22 DIAGNOSIS — I10 Essential (primary) hypertension: Secondary | ICD-10-CM | POA: Diagnosis not present

## 2012-02-22 DIAGNOSIS — R5381 Other malaise: Secondary | ICD-10-CM | POA: Diagnosis not present

## 2012-02-22 DIAGNOSIS — Z23 Encounter for immunization: Secondary | ICD-10-CM | POA: Diagnosis not present

## 2012-02-22 DIAGNOSIS — E119 Type 2 diabetes mellitus without complications: Secondary | ICD-10-CM | POA: Diagnosis not present

## 2012-02-22 DIAGNOSIS — I2510 Atherosclerotic heart disease of native coronary artery without angina pectoris: Secondary | ICD-10-CM | POA: Diagnosis not present

## 2012-02-26 DIAGNOSIS — R5381 Other malaise: Secondary | ICD-10-CM | POA: Diagnosis not present

## 2012-02-26 DIAGNOSIS — E119 Type 2 diabetes mellitus without complications: Secondary | ICD-10-CM | POA: Diagnosis not present

## 2012-02-26 DIAGNOSIS — I2510 Atherosclerotic heart disease of native coronary artery without angina pectoris: Secondary | ICD-10-CM | POA: Diagnosis not present

## 2012-02-26 DIAGNOSIS — R0602 Shortness of breath: Secondary | ICD-10-CM | POA: Diagnosis not present

## 2012-02-26 DIAGNOSIS — I10 Essential (primary) hypertension: Secondary | ICD-10-CM | POA: Diagnosis not present

## 2012-03-04 ENCOUNTER — Encounter: Payer: Self-pay | Admitting: Neurosurgery

## 2012-03-04 DIAGNOSIS — I10 Essential (primary) hypertension: Secondary | ICD-10-CM | POA: Diagnosis not present

## 2012-03-04 DIAGNOSIS — E119 Type 2 diabetes mellitus without complications: Secondary | ICD-10-CM | POA: Diagnosis not present

## 2012-03-04 DIAGNOSIS — R5381 Other malaise: Secondary | ICD-10-CM | POA: Diagnosis not present

## 2012-03-04 DIAGNOSIS — R5383 Other fatigue: Secondary | ICD-10-CM | POA: Diagnosis not present

## 2012-03-05 ENCOUNTER — Other Ambulatory Visit (INDEPENDENT_AMBULATORY_CARE_PROVIDER_SITE_OTHER): Payer: Medicare Other | Admitting: *Deleted

## 2012-03-05 ENCOUNTER — Encounter: Payer: Self-pay | Admitting: Neurosurgery

## 2012-03-05 ENCOUNTER — Ambulatory Visit (INDEPENDENT_AMBULATORY_CARE_PROVIDER_SITE_OTHER): Payer: Medicare Other | Admitting: Neurosurgery

## 2012-03-05 ENCOUNTER — Encounter (INDEPENDENT_AMBULATORY_CARE_PROVIDER_SITE_OTHER): Payer: Medicare Other | Admitting: *Deleted

## 2012-03-05 VITALS — BP 133/72 | HR 56 | Resp 18 | Ht 73.0 in | Wt 256.0 lb

## 2012-03-05 DIAGNOSIS — I714 Abdominal aortic aneurysm, without rupture, unspecified: Secondary | ICD-10-CM

## 2012-03-05 DIAGNOSIS — I724 Aneurysm of artery of lower extremity: Secondary | ICD-10-CM | POA: Diagnosis not present

## 2012-03-05 NOTE — Progress Notes (Signed)
[facility] [place] [facility] Office Note  CC: AAA and popliteal artery aneurysm surveillance Referring Physician: Early  History of Present Illness: 66 year old male patient of Dr. Donnetta Hutching followed for known AAA as well as left popliteal aneurysm. The patient denies any unusual abdominal or back pain. The patient denies any claudication, rest pain or open ulcerations on the lower extremities. The patient states he is changing cardiologist to [facility].  Past Medical History  Diagnosis Date  . Exertional angina   . Ischemic cardiomyopathy     ejection fraction 30-35% post bypass.  Cardiac catheterization with moderate LV dysfunction in 2009.  Vein graft to the diagonal and intermediate is intact, large internal mammary and distal right is fairly well collateralized.  Lung lesion in the circumflex coronary artery with a marginal that is jeopardized nonfavorable for coronary intervention.  . Palpitations   . Angioedema     secondary to [facility] E inhibitor therapy  . Bell palsy   . Diabetes mellitus   . AAA (abdominal aortic aneurysm)     CT angiogram, abdomen, pelvis, June 2012, stable 4.8 cm infrarenal fusiform abdominal aortic aneurysm.  The aneurysm extending into the right common iliac artery 3.1 cm in diameter.  Stable nonspecific left external iliac and inguinal adenopathy.  ABI 0.5 on the right side.  Marland Kitchen HTN (hypertension)   . PAD (peripheral artery disease)     status post peripheral angiogram.  May 2011 with infrarenal abdominal aortic aneurysm and total occlusion of the right superficial femoral artery with reconstitution in the below the knee popliteal artery.  Recommendation to be managed conservatively.  . Sleep apnea   . Kidney stones   . Arthritis   . Carotid artery disease     carotid DopplersOctober 2012, 0-39% bilateral ICA stenosis.  . Status post coronary artery bypass grafting     ROS: [x]  Positive   [ ]  Denies    General: [ ]   Weight loss, [ ]  Fever, [ ]  chills Neurologic: [ ]  Dizziness, [ ]  Blackouts, [ ]  Seizure [ ]  Stroke, [ ]  "Mini stroke", [ ]  Slurred speech, [ ]  Temporary blindness; [ ]  weakness in arms or legs, [ ]  Hoarseness Cardiac: [ ]  Chest pain/pressure, [ ]  Shortness of breath at rest [ ]  Shortness of breath with exertion, [ ]  Atrial fibrillation or irregular heartbeat Vascular: [ ]  Pain in legs with walking, [ ]  Pain in legs at rest, [ ]  Pain in legs at night,  [ ]  Non-healing ulcer, [ ]  Blood clot in vein/DVT,   Pulmonary: [ ]  Home oxygen, [ ]  Productive cough, [ ]  Coughing up blood, [ ]  Asthma,  [ ]  Wheezing Musculoskeletal:  [ ]  Arthritis, [ ]  Low back pain, [ ]  Joint pain Hematologic: [ ]  Easy Bruising, [ ]  Anemia; [ ]  Hepatitis Gastrointestinal: [ ]  Blood in stool, [ ]  Gastroesophageal Reflux/heartburn, [ ]  Trouble swallowing Urinary: [ ]  chronic Kidney disease, [ ]  on HD - [ ]  MWF or [ ]  TTHS, [ ]  Burning with urination, [ ]  Difficulty urinating Skin: [ ]  Rashes, [ ]  Wounds Psychological: [ ]  Anxiety, [ ]  Depression   Social History History  Substance Use Topics  . Smoking status: Former Smoker -- 1.0 packs/day for 25 years    Types: Cigarettes    Quit date: 01/09/2011  . Smokeless tobacco: Never Used  . Alcohol Use: No    Family History Family History  Problem Relation Age of Onset  .  Diabetes Neg Hx   . Hypertension Neg Hx   . Coronary artery disease Neg Hx   . Anesthesia problems Neg Hx     Allergies  Allergen Reactions  . Lisinopril Anaphylaxis and Swelling  . Codeine     REACTION: delirium    Current Outpatient Prescriptions  Medication Sig Dispense Refill  . allopurinol (ZYLOPRIM) 100 MG tablet Take 100 mg by mouth daily.        Marland Kitchen aspirin 81 MG tablet Take by mouth daily.        Marland Kitchen COREG 25 MG tablet TAKE (1) TABLET TWICE DAILY.  60 each  6  . furosemide (LASIX) 40 MG tablet Take 40 mg by mouth 2 (two) times daily.       Marland Kitchen glipiZIDE (GLUCOTROL) 10 MG tablet Take 10  mg by mouth 2 (two) times daily before a meal.        . indomethacin (INDOCIN) 25 MG capsule Take 25 mg by mouth daily as needed. Gout Flare Ups      . isosorbide mononitrate (IMDUR) 120 MG 24 hr tablet Take 180 mg by mouth daily.        Marland Kitchen linagliptin (TRADJENTA) 5 MG TABS tablet Take 5 mg by mouth daily.        . metFORMIN (GLUCOPHAGE) 1000 MG tablet Take 1,000 mg by mouth 2 (two) times daily with a meal.        . NEURONTIN 300 MG capsule TAKE 1 CAPSULE BY MOUTH TWICE DAILY.  30 each  0  . NITROSTAT 0.4 MG SL tablet PLACE ONE (1) TABLET UNDER TONGUE EVERY 5 MINUTES UP TO (3) DOSES AS NEEDED FOR CHEST PAIN.  25 each  3  . simvastatin (ZOCOR) 40 MG tablet Take 1 tablet (40 mg total) by mouth every evening.  30 tablet  6    Physical Examination  Filed Vitals:   03/05/12 1012  BP: 133/72  Pulse: 56  Resp: 18    Body mass index is 33.78 kg/(m^2).  General:  WDWN in NAD Gait: Normal HEENT: WNL Eyes: Pupils equal Pulmonary: normal non-labored breathing , without Rales, rhonchi,  wheezing Cardiac: RRR, without  Murmurs, rubs or gallops; No carotid bruits Abdomen: soft, NT, no masses Skin: no rashes, ulcers noted Vascular Exam/Pulses: 2+ radial pulses bilaterally, dorsalis pedis is palpable bilaterally, no abdominal AAA palpated due to girth   Extremities without ischemic changes, no Gangrene , no cellulitis; no open wounds;  Musculoskeletal: no muscle wasting or atrophy  Neurologic: A&O X 3; Appropriate Affect ; SENSATION: normal; MOTOR FUNCTION:  moving all extremities equally. Speech is fluent/normal  Non-Invasive Vascular Imaging: Popliteal aneurysm shows maximum diameter of 2.5 x 2.7 reviewed with Dr. Donnetta Hutching, AAA is 4.58 x 4.59, when compared to previous it was 4. 5 x 4.87 is stable  ASSESSMENT/PLAN: Asymptomatic and stable AAA and popliteal aneurysm. Per Dr. Donnetta Hutching will to obtain a CT of the abdomen pelvis with runoff and he will followup with Dr. Donnetta Hutching in 6 months. The patient's  questions were encouraged and answered, he is in agreement with this plan.  Beatris Ship ANP  Clinic M.D.: Early

## 2012-03-05 NOTE — Addendum Note (Signed)
Addended by: Peter Minium K on: 03/05/2012 11:00 AM   Modules accepted: Orders

## 2012-03-26 ENCOUNTER — Other Ambulatory Visit: Payer: Self-pay | Admitting: Cardiology

## 2012-05-27 ENCOUNTER — Other Ambulatory Visit: Payer: Self-pay | Admitting: *Deleted

## 2012-05-27 MED ORDER — SIMVASTATIN 40 MG PO TABS: 40.0000 mg | ORAL_TABLET | Freq: Every evening | ORAL | Status: DC

## 2012-05-30 ENCOUNTER — Ambulatory Visit: Payer: Medicare Other | Admitting: Cardiology

## 2012-06-04 DIAGNOSIS — Z951 Presence of aortocoronary bypass graft: Secondary | ICD-10-CM | POA: Diagnosis not present

## 2012-06-04 DIAGNOSIS — I714 Abdominal aortic aneurysm, without rupture: Secondary | ICD-10-CM | POA: Diagnosis not present

## 2012-06-04 DIAGNOSIS — I259 Chronic ischemic heart disease, unspecified: Secondary | ICD-10-CM | POA: Diagnosis not present

## 2012-06-04 DIAGNOSIS — I2510 Atherosclerotic heart disease of native coronary artery without angina pectoris: Secondary | ICD-10-CM | POA: Diagnosis not present

## 2012-06-04 DIAGNOSIS — I5022 Chronic systolic (congestive) heart failure: Secondary | ICD-10-CM | POA: Diagnosis not present

## 2012-06-27 DIAGNOSIS — L259 Unspecified contact dermatitis, unspecified cause: Secondary | ICD-10-CM | POA: Diagnosis not present

## 2012-06-27 DIAGNOSIS — I10 Essential (primary) hypertension: Secondary | ICD-10-CM | POA: Diagnosis not present

## 2012-06-27 DIAGNOSIS — E119 Type 2 diabetes mellitus without complications: Secondary | ICD-10-CM | POA: Diagnosis not present

## 2012-06-27 DIAGNOSIS — E669 Obesity, unspecified: Secondary | ICD-10-CM | POA: Diagnosis not present

## 2012-08-26 ENCOUNTER — Other Ambulatory Visit: Payer: Self-pay | Admitting: Cardiovascular Disease

## 2012-09-02 ENCOUNTER — Encounter: Payer: Self-pay | Admitting: Vascular Surgery

## 2012-09-03 ENCOUNTER — Ambulatory Visit
Admission: RE | Admit: 2012-09-03 | Discharge: 2012-09-03 | Disposition: A | Discharge status: Home / Self Care | Payer: Medicare Other | Source: Ambulatory Visit | Attending: Neurosurgery | Admitting: Neurosurgery

## 2012-09-03 ENCOUNTER — Encounter: Payer: Self-pay | Admitting: Vascular Surgery

## 2012-09-03 ENCOUNTER — Other Ambulatory Visit: Payer: Self-pay | Admitting: Vascular Surgery

## 2012-09-03 ENCOUNTER — Ambulatory Visit (INDEPENDENT_AMBULATORY_CARE_PROVIDER_SITE_OTHER): Payer: Medicare Other | Admitting: Vascular Surgery

## 2012-09-03 VITALS — BP 137/71 | HR 66 | Resp 20 | Ht 73.0 in | Wt 265.0 lb

## 2012-09-03 DIAGNOSIS — I724 Aneurysm of artery of lower extremity: Secondary | ICD-10-CM

## 2012-09-03 DIAGNOSIS — I714 Abdominal aortic aneurysm, without rupture: Secondary | ICD-10-CM

## 2012-09-03 DIAGNOSIS — R0602 Shortness of breath: Secondary | ICD-10-CM

## 2012-09-03 DIAGNOSIS — Z48812 Encounter for surgical aftercare following surgery on the circulatory system: Secondary | ICD-10-CM | POA: Diagnosis not present

## 2012-09-03 DIAGNOSIS — I739 Peripheral vascular disease, unspecified: Secondary | ICD-10-CM

## 2012-09-03 MED ORDER — IOHEXOL 350 MG/ML SOLN: 150.0000 mL | Freq: Once | INTRAVENOUS | Status: AC | PRN

## 2012-09-03 NOTE — Progress Notes (Signed)
The patient has today for continued followup of his abdominal aortic aneurysm and left popliteal artery aneurysm. This is been followed with serial ultrasounds. He had a CT scan today for a further definition of the size of his aneurysm in his aorta and also his popliteal artery. He continues to do well overall. He does have a limiting with the shortness of breath with exertion and also with leg pain with walking. If he does any of her exertion he will have angina as well. He does have known severe coronary disease with a diminished ejection fraction and a history of congestive heart failure.  Past Medical History  Diagnosis Date  . Exertional angina   . Ischemic cardiomyopathy     ejection fraction 30-35% post bypass.  Cardiac catheterization with moderate LV dysfunction in 2009.  Vein graft to the diagonal and intermediate is intact, large internal mammary and distal right is fairly well collateralized.  Lung lesion in the circumflex coronary artery with a marginal that is jeopardized nonfavorable for coronary intervention.  . Palpitations   . Angioedema     secondary to [facility] E inhibitor therapy  . Bell palsy   . Diabetes mellitus   . AAA (abdominal aortic aneurysm)     CT angiogram, abdomen, pelvis, June 2012, stable 4.8 cm infrarenal fusiform abdominal aortic aneurysm.  The aneurysm extending into the right common iliac artery 3.1 cm in diameter.  Stable nonspecific left external iliac and inguinal adenopathy.  ABI 0.5 on the right side.  Marland Kitchen HTN (hypertension)   . PAD (peripheral artery disease)     status post peripheral angiogram.  May 2011 with infrarenal abdominal aortic aneurysm and total occlusion of the right superficial femoral artery with reconstitution in the below the knee popliteal artery.  Recommendation to be managed conservatively.  . Sleep apnea   . Kidney stones   . Arthritis   . Carotid artery disease     carotid DopplersOctober 2012, 0-39% bilateral ICA stenosis.  . Status  post coronary artery bypass grafting     History  Substance Use Topics  . Smoking status: Former Smoker -- 1.00 packs/day for 25 years    Types: Cigarettes    Quit date: 01/09/2011  . Smokeless tobacco: Never Used  . Alcohol Use: No    Family History  Problem Relation Age of Onset  . Hypertension Neg Hx   . Coronary artery disease Neg Hx   . Anesthesia problems Neg Hx   . Diabetes Mother   . Heart disease Father   . AAA (abdominal aortic aneurysm) Father     Allergies  Allergen Reactions  . Lisinopril Anaphylaxis and Swelling  . Codeine     REACTION: delirium    Current outpatient prescriptions:allopurinol (ZYLOPRIM) 100 MG tablet, Take 100 mg by mouth daily.  , Disp: , Rfl: ;  aspirin 81 MG tablet, Take by mouth daily.  , Disp: , Rfl: ;  COREG 25 MG tablet, TAKE (1) TABLET TWICE DAILY., Disp: 60 tablet, Rfl: 6;  escitalopram (LEXAPRO) 10 MG tablet, Take 10 mg by mouth daily., Disp: , Rfl: ;  furosemide (LASIX) 40 MG tablet, Take 40 mg by mouth 2 (two) times daily. , Disp: , Rfl:  glipiZIDE (GLUCOTROL) 10 MG tablet, Take 10 mg by mouth 2 (two) times daily before a meal.  , Disp: , Rfl: ;  indomethacin (INDOCIN) 25 MG capsule, Take 25 mg by mouth daily as needed. Gout Flare Ups, Disp: , Rfl: ;  isosorbide mononitrate (IMDUR)  120 MG 24 hr tablet, Take 180 mg by mouth daily.  , Disp: , Rfl: ;  linagliptin (TRADJENTA) 5 MG TABS tablet, Take 5 mg by mouth daily.  , Disp: , Rfl:  metFORMIN (GLUCOPHAGE) 1000 MG tablet, Take 1,000 mg by mouth 2 (two) times daily with a meal.  , Disp: , Rfl: ;  NEURONTIN 300 MG capsule, TAKE 1 CAPSULE BY MOUTH TWICE DAILY., Disp: 30 each, Rfl: 0;  NITROSTAT 0.4 MG SL tablet, PLACE ONE (1) TABLET UNDER TONGUE EVERY 5 MINUTES UP TO (3) DOSES AS NEEDED FOR CHEST PAIN., Disp: 25 each, Rfl: 3;  simvastatin (ZOCOR) 40 MG tablet, TAKE 1 TABLET EVERY EVENING., Disp: 30 tablet, Rfl: 3 No current facility-administered medications for this visit. Facility-Administered  Medications Ordered in Other Visits: iohexol (OMNIPAQUE) 350 MG/ML injection 150 mL, 150 mL, Intravenous, Once PRN, Medication Radiologist, MD  BP 137/71  Pulse 66  Resp 20  Ht 6\' 1"  (1.854 m)  Wt 265 lb (120.203 kg)  BMI 34.97 kg/m2  Body mass index is 34.97 kg/(m^2).       Physical exam: Well-developed well-nourished obese gentleman in no acute distress Carotid arteries without bruits bilaterally 2+ radial pulses and 2+ femoral pulses bilaterally Prominent popliteal pulse on the left absent pulse on the right Abdomen obese and no tenderness no aneurysm palpable Neurologically he is grossly intact  CT scan today of his aorta and bilateral runoff reveals maximal diameter of 5.4 cm of his aneurysm in his aorta. This compares with 4.8 cm by CT scan 2 years ago. His distal left superficial femoral artery and popliteal junction the aneurysm is 2.7 which is unchanged. There is mural thrombus present. His aortic aneurysm against immediately below his renal artery takeoff. He does have 2 renal arteries bilaterally.  Impression and plan: Continued slow growth of the infrarenal abdominal aortic aneurysm in the setting of severe coronary disease. Had a very long discussion with the patient explaining the significance of open repair and the risk that it would put on his cardiac status. He has not a good candidate for standard infrarenal stent graft and has seen the group at [facility] for potential fenestrated graft. Since he remains asymptomatic with slow growth I do feel that his risk for repair is greater than his risk for rupture of her short course. Would recommend a followup in 6 months. Also explained symptoms of popliteal artery occlusion as well. He understands and will see [place] again in 6 months for continued evaluation

## 2012-09-03 NOTE — Addendum Note (Signed)
Addended by: Reola Calkins on: 09/03/2012 03:46 PM   Modules accepted: Orders

## 2012-10-02 DIAGNOSIS — I10 Essential (primary) hypertension: Secondary | ICD-10-CM | POA: Diagnosis not present

## 2012-10-02 DIAGNOSIS — Z888 Allergy status to other drugs, medicaments and biological substances status: Secondary | ICD-10-CM | POA: Diagnosis not present

## 2012-10-02 DIAGNOSIS — Z885 Allergy status to narcotic agent status: Secondary | ICD-10-CM | POA: Diagnosis not present

## 2012-10-02 DIAGNOSIS — J984 Other disorders of lung: Secondary | ICD-10-CM | POA: Diagnosis not present

## 2012-10-02 DIAGNOSIS — Z7982 Long term (current) use of aspirin: Secondary | ICD-10-CM | POA: Diagnosis not present

## 2012-10-02 DIAGNOSIS — Z87891 Personal history of nicotine dependence: Secondary | ICD-10-CM | POA: Diagnosis not present

## 2012-10-02 DIAGNOSIS — I2510 Atherosclerotic heart disease of native coronary artery without angina pectoris: Secondary | ICD-10-CM | POA: Diagnosis not present

## 2012-10-02 DIAGNOSIS — Z79899 Other long term (current) drug therapy: Secondary | ICD-10-CM | POA: Diagnosis not present

## 2012-10-02 DIAGNOSIS — I252 Old myocardial infarction: Secondary | ICD-10-CM | POA: Diagnosis not present

## 2012-10-02 DIAGNOSIS — Z833 Family history of diabetes mellitus: Secondary | ICD-10-CM | POA: Diagnosis not present

## 2012-10-02 DIAGNOSIS — Z8679 Personal history of other diseases of the circulatory system: Secondary | ICD-10-CM | POA: Diagnosis not present

## 2012-10-02 DIAGNOSIS — E1142 Type 2 diabetes mellitus with diabetic polyneuropathy: Secondary | ICD-10-CM | POA: Diagnosis not present

## 2012-10-02 DIAGNOSIS — Z951 Presence of aortocoronary bypass graft: Secondary | ICD-10-CM | POA: Diagnosis not present

## 2012-10-02 DIAGNOSIS — F329 Major depressive disorder, single episode, unspecified: Secondary | ICD-10-CM | POA: Diagnosis not present

## 2012-10-02 DIAGNOSIS — Z8249 Family history of ischemic heart disease and other diseases of the circulatory system: Secondary | ICD-10-CM | POA: Diagnosis not present

## 2012-10-02 DIAGNOSIS — E1149 Type 2 diabetes mellitus with other diabetic neurological complication: Secondary | ICD-10-CM | POA: Diagnosis not present

## 2012-10-02 DIAGNOSIS — E785 Hyperlipidemia, unspecified: Secondary | ICD-10-CM | POA: Diagnosis not present

## 2012-10-02 DIAGNOSIS — T783XXA Angioneurotic edema, initial encounter: Secondary | ICD-10-CM | POA: Diagnosis not present

## 2012-10-02 DIAGNOSIS — M109 Gout, unspecified: Secondary | ICD-10-CM | POA: Diagnosis not present

## 2012-10-03 DIAGNOSIS — I10 Essential (primary) hypertension: Secondary | ICD-10-CM | POA: Diagnosis not present

## 2012-10-03 DIAGNOSIS — E119 Type 2 diabetes mellitus without complications: Secondary | ICD-10-CM | POA: Diagnosis not present

## 2012-10-03 DIAGNOSIS — T783XXA Angioneurotic edema, initial encounter: Secondary | ICD-10-CM | POA: Diagnosis not present

## 2012-10-28 ENCOUNTER — Other Ambulatory Visit: Payer: Self-pay | Admitting: Physician Assistant

## 2012-11-27 ENCOUNTER — Other Ambulatory Visit: Payer: Self-pay | Admitting: Cardiology

## 2012-12-27 ENCOUNTER — Other Ambulatory Visit: Payer: Self-pay | Admitting: Cardiovascular Disease

## 2013-02-06 DIAGNOSIS — H5210 Myopia, unspecified eye: Secondary | ICD-10-CM | POA: Diagnosis not present

## 2013-02-06 DIAGNOSIS — H524 Presbyopia: Secondary | ICD-10-CM | POA: Diagnosis not present

## 2013-02-06 DIAGNOSIS — H52229 Regular astigmatism, unspecified eye: Secondary | ICD-10-CM | POA: Diagnosis not present

## 2013-02-06 DIAGNOSIS — E119 Type 2 diabetes mellitus without complications: Secondary | ICD-10-CM | POA: Diagnosis not present

## 2013-02-12 DIAGNOSIS — H26499 Other secondary cataract, unspecified eye: Secondary | ICD-10-CM | POA: Diagnosis not present

## 2013-02-12 DIAGNOSIS — E119 Type 2 diabetes mellitus without complications: Secondary | ICD-10-CM | POA: Diagnosis not present

## 2013-02-19 ENCOUNTER — Ambulatory Visit: Payer: Medicare Other | Admitting: Cardiology

## 2013-02-19 ENCOUNTER — Ambulatory Visit (INDEPENDENT_AMBULATORY_CARE_PROVIDER_SITE_OTHER): Payer: Medicare Other | Admitting: Cardiology

## 2013-02-19 ENCOUNTER — Encounter: Payer: Self-pay | Admitting: Cardiology

## 2013-02-19 VITALS — BP 120/73 | HR 67 | Ht 73.0 in | Wt 266.0 lb

## 2013-02-19 DIAGNOSIS — E785 Hyperlipidemia, unspecified: Secondary | ICD-10-CM

## 2013-02-19 DIAGNOSIS — I2510 Atherosclerotic heart disease of native coronary artery without angina pectoris: Secondary | ICD-10-CM | POA: Diagnosis not present

## 2013-02-19 DIAGNOSIS — I714 Abdominal aortic aneurysm, without rupture, unspecified: Secondary | ICD-10-CM

## 2013-02-19 DIAGNOSIS — I5022 Chronic systolic (congestive) heart failure: Secondary | ICD-10-CM | POA: Diagnosis not present

## 2013-02-19 NOTE — Assessment & Plan Note (Signed)
Follows with Dr. Legrand Rams, continues on Zocor.

## 2013-02-19 NOTE — Assessment & Plan Note (Signed)
Keep followup with Dr. Donnetta Hutching.

## 2013-02-19 NOTE — Progress Notes (Signed)
Clinical Summary [name] is a 67 y.o.male presenting for office visit. He is a former patient of Dr. Fletcher Anon and previously Dr. Dannielle Burn, last seen in the office in July 2013.  He reports stable angina symptoms, no increasing use of nitroglycerin. Still has claudication and pain when standing up, but states that he is generally able to walk further than he has in the past.  Lipids are followed by Dr. Legrand Rams. He continues on statin therapy.  He follows with Dr. Donnetta Hutching per history of infrarenal AAA and left popliteal artery aneurysm. Most recent AAA measurement was 5.4 cm in April compared to 4.8 cm 2 years ago. Left popliteal aneurysm 2.7 cm was unchanged. He has been followed expectantly, felt to be a poor candidate for open repair and not a good candidate for standard infrarenal stent grafting.   Allergies  Allergen Reactions  . Lisinopril Anaphylaxis and Swelling  . Codeine     REACTION: delirium    Current Outpatient Prescriptions  Medication Sig Dispense Refill  . allopurinol (ZYLOPRIM) 100 MG tablet Take 100 mg by mouth daily.        Marland Kitchen aspirin 81 MG tablet Take by mouth daily.        . carvedilol (COREG) 25 MG tablet TAKE (1) TABLET TWICE DAILY.  60 tablet  3  . furosemide (LASIX) 40 MG tablet Take 40 mg by mouth 2 (two) times daily.       Marland Kitchen glipiZIDE (GLUCOTROL) 10 MG tablet Take 10 mg by mouth 2 (two) times daily before a meal.        . indomethacin (INDOCIN) 25 MG capsule Take 25 mg by mouth daily as needed. Gout Flare Ups      . isosorbide mononitrate (IMDUR) 120 MG 24 hr tablet Take 180 mg by mouth daily.        Marland Kitchen linagliptin (TRADJENTA) 5 MG TABS tablet Take 5 mg by mouth daily.        . metFORMIN (GLUCOPHAGE) 1000 MG tablet Take 1,000 mg by mouth 2 (two) times daily with a meal.        . NEURONTIN 300 MG capsule TAKE 1 CAPSULE BY MOUTH TWICE DAILY.  30 each  0  . NITROSTAT 0.4 MG SL tablet PLACE ONE (1) TABLET UNDER TONGUE EVERY 5 MINUTES UP TO (3) DOSES AS NEEDED FOR  CHEST PAIN.  25 tablet  0  . simvastatin (ZOCOR) 40 MG tablet TAKE 1 TABLET ONCE DAILY.  30 tablet  1   No current facility-administered medications for this visit.    Past Medical History  Diagnosis Date  . Ischemic cardiomyopathy     LVEF 45-50% 11/2011  . Coronary atherosclerosis of native coronary artery     Multivessel status post CABG  . Angioedema     Secondary to ACE inhibitor  . Bell palsy   . Type 2 diabetes mellitus   . AAA (abdominal aortic aneurysm)     Followed by Dr. Donnetta Hutching  . Essential hypertension, benign   . PAD (peripheral artery disease)     Followed by Dr. Donnetta Hutching  . Sleep apnea   . Nephrolithiasis   . Arthritis   . Carotid artery disease     Past Surgical History  Procedure Laterality Date  . Coronary artery bypass graft      LIMA to LAD, SVG to diagonal and ramus, SVG to OM, SVG to AM  . Hand surgery  2009  . Cataract extraction w/phaco  03/23/2011  Procedure: CATARACT EXTRACTION PHACO AND INTRAOCULAR LENS PLACEMENT (IOC);  Surgeon: Tonny Branch;  Location: [facility];  Service: Ophthalmology;  Laterality: Left;  CDE: 15.99  . Cataract extraction w/phaco  04/17/2011    Procedure: CATARACT EXTRACTION PHACO AND INTRAOCULAR LENS PLACEMENT (IOC);  Surgeon: Tonny Branch;  Location: [facility];  Service: Ophthalmology;  Laterality: Right;  CDE: 23.45    Family History  Problem Relation Age of Onset  . Diabetes Mother   . Heart disease Father   . AAA (abdominal aortic aneurysm) Father     Social History [name] reports that he quit smoking about 2 years ago. His smoking use included Cigarettes. He has a 25 pack-year smoking history. He has never used smokeless tobacco. [name] reports that he does not drink alcohol.  Review of Systems Negative except as outlined.  Physical Examination Filed Vitals:   02/19/13 1020  BP: 120/73  Pulse: 67   Filed Weights   02/19/13 1020  Weight: 266 lb (120.657 kg)   Obese male, comfortable at rest. HEENT:  Conjunctiva and lids normal, oropharynx clear. Neck: Supple, no elevated JVP or carotid bruits, no thyromegaly. Lungs: Clear to auscultation, nonlabored breathing at rest. Cardiac: Regular rate and rhythm, S4, no significant systolic murmur, no pericardial rub. Abdomen: Soft, nontender, protuberant, bowel sounds present. Extremities: No pitting edema, palpable popliteal on left, absent on right. Skin: Warm and dry. Musculoskeletal: No kyphosis. Neuropsychiatric: Alert and oriented x3, affect grossly appropriate.   Problem List and Plan   Coronary atherosclerosis of native coronary artery Stable angina on medical therapy. Walking regimen as tolerated. Followup arranged.  Chronic systolic heart failure Weight is stable since April. LVEF only mildly reduced by last assessment, 45-50%.  DYSLIPIDEMIA Follows with Dr. Legrand Rams, continues on Zocor.  Abdominal aneurysm without mention of rupture Keep followup with Dr. Donnetta Hutching.    Satira Sark, M.D., F.A.C.C.

## 2013-02-19 NOTE — Assessment & Plan Note (Signed)
Weight is stable since April. LVEF only mildly reduced by last assessment, 45-50%.

## 2013-02-19 NOTE — Patient Instructions (Signed)
Continue all current medications. Your physician wants you to follow up in: 6 months.  You will receive a reminder letter in the mail one-two months in advance.  If you don't receive a letter, please call our office to schedule the follow up appointment   

## 2013-02-19 NOTE — Assessment & Plan Note (Signed)
Stable angina on medical therapy. Walking regimen as tolerated. Followup arranged.

## 2013-02-27 ENCOUNTER — Other Ambulatory Visit: Payer: Self-pay | Admitting: Cardiovascular Disease

## 2013-03-17 ENCOUNTER — Encounter: Payer: Self-pay | Admitting: Vascular Surgery

## 2013-03-18 ENCOUNTER — Ambulatory Visit (INDEPENDENT_AMBULATORY_CARE_PROVIDER_SITE_OTHER)
Admission: RE | Admit: 2013-03-18 | Discharge: 2013-03-18 | Disposition: A | Discharge status: Home / Self Care | Payer: Medicare Other | Source: Ambulatory Visit | Attending: Vascular Surgery | Admitting: Vascular Surgery

## 2013-03-18 ENCOUNTER — Ambulatory Visit (INDEPENDENT_AMBULATORY_CARE_PROVIDER_SITE_OTHER): Payer: Medicare Other | Admitting: Vascular Surgery

## 2013-03-18 ENCOUNTER — Ambulatory Visit (HOSPITAL_COMMUNITY)
Admission: RE | Admit: 2013-03-18 | Discharge: 2013-03-18 | Disposition: A | Discharge status: Home / Self Care | Payer: Medicare Other | Source: Ambulatory Visit | Attending: Vascular Surgery | Admitting: Vascular Surgery

## 2013-03-18 ENCOUNTER — Encounter: Payer: Self-pay | Admitting: Vascular Surgery

## 2013-03-18 VITALS — BP 173/79 | HR 63 | Resp 18 | Ht 73.0 in | Wt 268.0 lb

## 2013-03-18 DIAGNOSIS — I724 Aneurysm of artery of lower extremity: Secondary | ICD-10-CM

## 2013-03-18 DIAGNOSIS — Z48812 Encounter for surgical aftercare following surgery on the circulatory system: Secondary | ICD-10-CM | POA: Insufficient documentation

## 2013-03-18 DIAGNOSIS — I714 Abdominal aortic aneurysm, without rupture, unspecified: Secondary | ICD-10-CM | POA: Insufficient documentation

## 2013-03-18 NOTE — Progress Notes (Signed)
The patient presents today for continued discussion of his infrarenal abdominal aortic aneurysm. He has had no new episodes of congestive heart failure since her last visit with him. He does have a known moderate to large aneurysm is felt to be at high risk for open repair due to his cardiac difficulty. He does have a prior CT scan showing that his aneurysm begins at the level renal arteries. He does have a known history of right superficial artery occlusion and moderate size left distal superficial femoral artery and popliteal aneurysm.  Past Medical History  Diagnosis Date  . Ischemic cardiomyopathy     LVEF 45-50% 11/2011  . Coronary atherosclerosis of native coronary artery     Multivessel status post CABG  . Angioedema     Secondary to ACE inhibitor  . Bell palsy   . Type 2 diabetes mellitus   . AAA (abdominal aortic aneurysm)     Followed by Dr. Donnetta Hutching  . Essential hypertension, benign   . PAD (peripheral artery disease)     Followed by Dr. Donnetta Hutching  . Sleep apnea   . Nephrolithiasis   . Arthritis   . Carotid artery disease     History  Substance Use Topics  . Smoking status: Former Smoker -- 1.00 packs/day for 25 years    Types: Cigarettes    Quit date: 05/23/2007  . Smokeless tobacco: Never Used  . Alcohol Use: No    Family History  Problem Relation Age of Onset  . Diabetes Mother   . Heart disease Father   . AAA (abdominal aortic aneurysm) Father     Allergies  Allergen Reactions  . Lisinopril Anaphylaxis and Swelling  . Codeine     REACTION: delirium    Current outpatient prescriptions:allopurinol (ZYLOPRIM) 100 MG tablet, Take 100 mg by mouth daily.  , Disp: , Rfl: ;  aspirin 81 MG tablet, Take by mouth daily.  , Disp: , Rfl: ;  carvedilol (COREG) 25 MG tablet, TAKE (1) TABLET TWICE DAILY., Disp: 60 tablet, Rfl: 3;  furosemide (LASIX) 40 MG tablet, Take 40 mg by mouth 2 (two) times daily. , Disp: , Rfl:  glipiZIDE (GLUCOTROL) 10 MG tablet, Take 10 mg by mouth 2  (two) times daily before a meal.  , Disp: , Rfl: ;  isosorbide mononitrate (IMDUR) 120 MG 24 hr tablet, Take 180 mg by mouth daily.  , Disp: , Rfl: ;  linagliptin (TRADJENTA) 5 MG TABS tablet, Take 5 mg by mouth daily.  , Disp: , Rfl: ;  metFORMIN (GLUCOPHAGE) 1000 MG tablet, Take 1,000 mg by mouth 2 (two) times daily with a meal.  , Disp: , Rfl:  NEURONTIN 300 MG capsule, TAKE 1 CAPSULE BY MOUTH TWICE DAILY., Disp: 30 each, Rfl: 0;  NITROSTAT 0.4 MG SL tablet, PLACE ONE (1) TABLET UNDER TONGUE EVERY 5 MINUTES UP TO (3) DOSES AS NEEDED FOR CHEST PAIN., Disp: 25 tablet, Rfl: 0;  simvastatin (ZOCOR) 40 MG tablet, TAKE 1 TABLET ONCE DAILY., Disp: 30 tablet, Rfl: 1;  indomethacin (INDOCIN) 25 MG capsule, Take 25 mg by mouth daily as needed. Gout Flare Ups, Disp: , Rfl:   BP 173/79  Pulse 63  Resp 18  Ht 6\' 1"  (1.854 m)  Wt 268 lb (121.564 kg)  BMI 35.37 kg/m2  Body mass index is 35.37 kg/(m^2).  Physical exam: Alert oriented gentleman in no acute distress with obesity. Neurologically he is grossly intact Heart regular rate and rhythm without murmur Radial and femoral pulses 2+  bilaterally. He does have a prominent left popliteal pulse and absent right popliteal pulse Abdomen obese I do not palpate an aneurysm  Ultrasound of his aorta today reveals continued decreased size up to 5.8 cm. This was 5.4 cm 6 months ago. His to artery aneurysm was also expanded to 2.9 from 2.7 cm. Ankle arm index is stable on the right at 0.74 is 1.0 on the left  Impression and plan continued growth of his juxtarenal abdominal aortic aneurysm. I discussed this at great length with the patient. He has seen the vascular group at [facility] in the past when there was some concern about potential symptoms. I have recommended that we refer him back to Dr. Sammuel Hines at [facility] for consideration as demonstrated graft repair of his juxtarenal aneurysm. We will arrange the followup visit with him. I will see him in 6 months for  continued followup of his popliteal aneurysm.

## 2013-03-27 ENCOUNTER — Other Ambulatory Visit: Payer: Self-pay

## 2013-03-28 ENCOUNTER — Telehealth: Payer: Self-pay | Admitting: Vascular Surgery

## 2013-03-28 NOTE — Telephone Encounter (Signed)
Notified patient of appt., 04-14-13, with Dr. Sammuel Hines at [facility] regarding his AAA as per Dr. Luther Parody request.

## 2013-03-31 ENCOUNTER — Other Ambulatory Visit: Payer: Self-pay | Admitting: Cardiovascular Disease

## 2013-04-14 DIAGNOSIS — I700 Atherosclerosis of aorta: Secondary | ICD-10-CM | POA: Diagnosis not present

## 2013-04-14 DIAGNOSIS — K439 Ventral hernia without obstruction or gangrene: Secondary | ICD-10-CM | POA: Diagnosis not present

## 2013-04-14 DIAGNOSIS — N200 Calculus of kidney: Secondary | ICD-10-CM | POA: Diagnosis not present

## 2013-04-14 DIAGNOSIS — I714 Abdominal aortic aneurysm, without rupture: Secondary | ICD-10-CM | POA: Diagnosis not present

## 2013-04-14 DIAGNOSIS — I716 Thoracoabdominal aortic aneurysm, without rupture: Secondary | ICD-10-CM | POA: Diagnosis not present

## 2013-04-14 DIAGNOSIS — I723 Aneurysm of iliac artery: Secondary | ICD-10-CM | POA: Diagnosis not present

## 2013-04-14 DIAGNOSIS — R599 Enlarged lymph nodes, unspecified: Secondary | ICD-10-CM | POA: Diagnosis not present

## 2013-05-27 ENCOUNTER — Other Ambulatory Visit: Payer: Self-pay | Admitting: Cardiology

## 2013-06-12 DIAGNOSIS — I716 Thoracoabdominal aortic aneurysm, without rupture, unspecified: Secondary | ICD-10-CM | POA: Diagnosis not present

## 2013-06-30 DIAGNOSIS — E119 Type 2 diabetes mellitus without complications: Secondary | ICD-10-CM | POA: Diagnosis not present

## 2013-06-30 DIAGNOSIS — I2510 Atherosclerotic heart disease of native coronary artery without angina pectoris: Secondary | ICD-10-CM | POA: Diagnosis not present

## 2013-06-30 DIAGNOSIS — R0602 Shortness of breath: Secondary | ICD-10-CM | POA: Diagnosis not present

## 2013-06-30 DIAGNOSIS — I509 Heart failure, unspecified: Secondary | ICD-10-CM | POA: Diagnosis not present

## 2013-07-07 DIAGNOSIS — I2510 Atherosclerotic heart disease of native coronary artery without angina pectoris: Secondary | ICD-10-CM | POA: Diagnosis not present

## 2013-07-07 DIAGNOSIS — R0602 Shortness of breath: Secondary | ICD-10-CM | POA: Diagnosis not present

## 2013-07-07 DIAGNOSIS — E109 Type 1 diabetes mellitus without complications: Secondary | ICD-10-CM | POA: Diagnosis not present

## 2013-07-07 DIAGNOSIS — I509 Heart failure, unspecified: Secondary | ICD-10-CM | POA: Diagnosis not present

## 2013-07-11 ENCOUNTER — Other Ambulatory Visit: Payer: Self-pay | Admitting: *Deleted

## 2013-07-11 ENCOUNTER — Telehealth: Payer: Self-pay | Admitting: *Deleted

## 2013-07-11 DIAGNOSIS — I2510 Atherosclerotic heart disease of native coronary artery without angina pectoris: Secondary | ICD-10-CM

## 2013-07-11 DIAGNOSIS — I5022 Chronic systolic (congestive) heart failure: Secondary | ICD-10-CM

## 2013-07-11 NOTE — Telephone Encounter (Signed)
Correspondence shown to Dr. Domenic Polite and he agreed to ordering echo prior to follow up appointment in March 2015. All records available in [place].

## 2013-07-24 ENCOUNTER — Other Ambulatory Visit (INDEPENDENT_AMBULATORY_CARE_PROVIDER_SITE_OTHER): Payer: Medicare Other

## 2013-07-24 DIAGNOSIS — I2510 Atherosclerotic heart disease of native coronary artery without angina pectoris: Secondary | ICD-10-CM

## 2013-07-24 DIAGNOSIS — I5022 Chronic systolic (congestive) heart failure: Secondary | ICD-10-CM

## 2013-07-26 ENCOUNTER — Other Ambulatory Visit: Payer: Self-pay | Admitting: Cardiology

## 2013-07-28 DIAGNOSIS — I10 Essential (primary) hypertension: Secondary | ICD-10-CM | POA: Diagnosis not present

## 2013-07-28 DIAGNOSIS — E669 Obesity, unspecified: Secondary | ICD-10-CM | POA: Diagnosis not present

## 2013-07-28 DIAGNOSIS — E119 Type 2 diabetes mellitus without complications: Secondary | ICD-10-CM | POA: Diagnosis not present

## 2013-07-31 ENCOUNTER — Ambulatory Visit: Payer: Medicare Other | Admitting: Cardiology

## 2013-08-06 ENCOUNTER — Encounter: Payer: Self-pay | Admitting: *Deleted

## 2013-08-29 ENCOUNTER — Ambulatory Visit (INDEPENDENT_AMBULATORY_CARE_PROVIDER_SITE_OTHER): Payer: Medicare Other | Admitting: Cardiology

## 2013-08-29 ENCOUNTER — Encounter: Payer: Self-pay | Admitting: *Deleted

## 2013-08-29 ENCOUNTER — Encounter: Payer: Self-pay | Admitting: Cardiology

## 2013-08-29 VITALS — BP 131/74 | HR 66 | Wt 274.8 lb

## 2013-08-29 DIAGNOSIS — I2510 Atherosclerotic heart disease of native coronary artery without angina pectoris: Secondary | ICD-10-CM

## 2013-08-29 DIAGNOSIS — E785 Hyperlipidemia, unspecified: Secondary | ICD-10-CM | POA: Diagnosis not present

## 2013-08-29 DIAGNOSIS — I2589 Other forms of chronic ischemic heart disease: Secondary | ICD-10-CM | POA: Diagnosis not present

## 2013-08-29 DIAGNOSIS — I255 Ischemic cardiomyopathy: Secondary | ICD-10-CM

## 2013-08-29 DIAGNOSIS — Z01810 Encounter for preprocedural cardiovascular examination: Secondary | ICD-10-CM

## 2013-08-29 DIAGNOSIS — E1149 Type 2 diabetes mellitus with other diabetic neurological complication: Secondary | ICD-10-CM

## 2013-08-29 DIAGNOSIS — I10 Essential (primary) hypertension: Secondary | ICD-10-CM

## 2013-08-29 NOTE — Patient Instructions (Signed)
Your physician recommends that you schedule a follow-up appointment in: 4 months. You will receive a reminder letter in the mail in about 1-2 months reminding you to call and schedule your appointment. If you don't receive this letter, please contact our office. Your physician recommends that you continue on your current medications as directed. Please refer to the Current Medication list given to you today. Your physician has requested that you have a lexiscan myoview. For further information please visit [email or web address]. Please follow instruction sheet, as given.

## 2013-08-29 NOTE — Assessment & Plan Note (Signed)
Type 2 diabetes mellitus, followed by Dr. Legrand Rams.

## 2013-08-29 NOTE — Assessment & Plan Note (Signed)
LVEF stable in the range of 45-50%. Continue medical therapy.

## 2013-08-29 NOTE — Assessment & Plan Note (Signed)
He continues on Zocor. 

## 2013-08-29 NOTE — Assessment & Plan Note (Addendum)
Patient being assessed by Dr. Sammuel Hines at [facility] regarding probable endovascular juxtarenal AAA repair. He has been relatively stable from a cardiac perspective, recent echocardiogram also shows stable LVEF the range of 45-50%. CABG was in 2001. He has not had recent ischemic testing, cardiac catheterization in 2009 showed combination of native vessel and graft disease that has been managed medically. We will obtain an [place] on medical therapy to assess ischemic burden. Main issue is to exclude any high risk results that would further impact his surgical risk. We will inform him of the results and forward the results of Dr. Sammuel Hines as well.

## 2013-08-29 NOTE — Progress Notes (Signed)
Clinical Summary [name] is an 68 y.o.male when seen in October 2014. He reports no accelerating angina symptoms with stable dyspnea on exertion on medical therapy.  He continues to follow with Dr. Donnetta Hutching, abdominal ultrasound showed juxtarenal AAA 5.8 cm in October 2014 and a popliteal aneurysm of 2.9 cm. Both increased. It was recommended that he followup with Dr. Sammuel Hines at [facility] for additional consideration of management options. He states that he has been seen twice now at [facility], not sure about next step in terms of endovascular repair. It was requested that he have a followup echocardiogram.  Followup echocardiogram done recently in March showed moderate LVH with stable LVEF 45-50%, difficult to assess focal wall motion, aortic valve sclerosis without stenosis, mild left atrial enlargement. We discussed this today.   Allergies  Allergen Reactions  . Lisinopril Anaphylaxis and Swelling  . Codeine     REACTION: delirium    Current Outpatient Prescriptions  Medication Sig Dispense Refill  . allopurinol (ZYLOPRIM) 100 MG tablet Take 100 mg by mouth daily.        Marland Kitchen aspirin 81 MG tablet Take by mouth daily.        . carvedilol (COREG) 25 MG tablet TAKE (1) TABLET TWICE DAILY.  60 tablet  6  . furosemide (LASIX) 40 MG tablet Take 40 mg by mouth 2 (two) times daily.       Marland Kitchen glipiZIDE (GLUCOTROL) 10 MG tablet Take 10 mg by mouth 2 (two) times daily before a meal.       . isosorbide mononitrate (IMDUR) 120 MG 24 hr tablet Take 180 mg by mouth daily.        Marland Kitchen linagliptin (TRADJENTA) 5 MG TABS tablet Take 5 mg by mouth daily.        . metFORMIN (GLUCOPHAGE) 1000 MG tablet Take 1,000 mg by mouth 2 (two) times daily with a meal.        . NEURONTIN 300 MG capsule TAKE 1 CAPSULE BY MOUTH TWICE DAILY.  30 each  0  . NITROSTAT 0.4 MG SL tablet PLACE ONE (1) TABLET UNDER TONGUE EVERY 5 MINUTES UP TO (3) DOSES AS NEEDED FOR CHEST PAIN.  25 tablet  3  . simvastatin (ZOCOR) 40 MG tablet TAKE  1 TABLET ONCE DAILY.  30 tablet  6   No current facility-administered medications for this visit.    Past Medical History  Diagnosis Date  . Ischemic cardiomyopathy     LVEF 45-50% 11/2011  . Coronary atherosclerosis of native coronary artery     Multivessel status post CABG  . Angioedema     Secondary to ACE inhibitor  . Bell palsy   . Type 2 diabetes mellitus   . AAA (abdominal aortic aneurysm)     Followed by Dr. Donnetta Hutching  . Essential hypertension, benign   . PAD (peripheral artery disease)     Followed by Dr. Donnetta Hutching  . Sleep apnea   . Nephrolithiasis   . Arthritis   . Carotid artery disease     Past Surgical History  Procedure Laterality Date  . Coronary artery bypass graft      LIMA to LAD, SVG to diagonal and ramus, SVG to OM, SVG to AM  . Hand surgery  2009  . Cataract extraction w/phaco  03/23/2011    Procedure: CATARACT EXTRACTION PHACO AND INTRAOCULAR LENS PLACEMENT (IOC);  Surgeon: Tonny Branch;  Location: [facility];  Service: Ophthalmology;  Laterality: Left;  CDE: 15.99  . Cataract  extraction w/phaco  04/17/2011    Procedure: CATARACT EXTRACTION PHACO AND INTRAOCULAR LENS PLACEMENT (IOC);  Surgeon: Tonny Branch;  Location: [facility];  Service: Ophthalmology;  Laterality: Right;  CDE: 23.45  . Foot surgery Left 1963    Social History [name] reports that he quit smoking about 6 years ago. His smoking use included Cigarettes. He has a 25 pack-year smoking history. He has never used smokeless tobacco. [name] reports that he does not drink alcohol.  Review of Systems No palpitations or syncope. Having trouble with peripheral neuropathy. Stable appetite. Trying to lose weight. Otherwise negative.  Physical Examination Filed Vitals:   08/29/13 1035  BP: 131/74  Pulse: 66   Filed Weights   08/29/13 1035  Weight: 274 lb 12.8 oz (124.648 kg)    Obese male, comfortable at rest.  HEENT: Conjunctiva and lids normal, oropharynx clear.  Neck: Supple, no elevated  JVP or carotid bruits, no thyromegaly.  Lungs: Clear to auscultation, nonlabored breathing at rest.  Cardiac: Regular rate and rhythm, S4, no significant systolic murmur, no pericardial rub.  Abdomen: Soft, nontender, protuberant, bowel sounds present.  Extremities: No pitting edema, palpable popliteal on left, absent on right.  Skin: Warm and dry.  Musculoskeletal: No kyphosis.  Neuropsychiatric: Alert and oriented x3, affect grossly appropriate.   Problem List and Plan   Preoperative cardiovascular examination Patient being assessed by Dr. Sammuel Hines at [facility] regarding probable endovascular juxtarenal AAA repair. He has been relatively stable from a cardiac perspective, recent echocardiogram also shows stable LVEF the range of 45-50%. CABG was in 2001. He has not had recent ischemic testing, cardiac catheterization in 2009 showed combination of native vessel and graft disease that has been managed medically. We will obtain an [place] on medical therapy to assess ischemic burden. Main issue is to exclude any high risk results that would further impact his surgical risk. We will inform him of the results and forward the results of Dr. Sammuel Hines as well.  Ischemic cardiomyopathy LVEF stable in the range of 45-50%. Continue medical therapy.  DYSLIPIDEMIA He continues on Zocor.  Essential hypertension, benign Reasonable blood pressure control today. No changes made.  DIABETIC PERIPHERAL NEUROPATHY Type 2 diabetes mellitus, followed by Dr. Legrand Rams.    Satira Sark, M.D., F.A.C.C.

## 2013-08-29 NOTE — Assessment & Plan Note (Signed)
Reasonable blood pressure control today. No changes made.

## 2013-09-04 ENCOUNTER — Encounter (HOSPITAL_COMMUNITY)
Admission: RE | Admit: 2013-09-04 | Discharge: 2013-09-04 | Disposition: A | Discharge status: Home / Self Care | Payer: Medicare Other | Source: Ambulatory Visit | Attending: Cardiology | Admitting: Cardiology

## 2013-09-04 ENCOUNTER — Encounter (HOSPITAL_COMMUNITY): Payer: Self-pay

## 2013-09-04 DIAGNOSIS — Z01810 Encounter for preprocedural cardiovascular examination: Secondary | ICD-10-CM

## 2013-09-04 DIAGNOSIS — I2510 Atherosclerotic heart disease of native coronary artery without angina pectoris: Secondary | ICD-10-CM

## 2013-09-04 DIAGNOSIS — I739 Peripheral vascular disease, unspecified: Secondary | ICD-10-CM

## 2013-09-04 DIAGNOSIS — I779 Disorder of arteries and arterioles, unspecified: Secondary | ICD-10-CM

## 2013-09-04 MED ORDER — SODIUM CHLORIDE 0.9 % IJ SOLN
INTRAMUSCULAR | Status: AC
  Administered 2013-09-04: 10 mL via INTRAVENOUS
  Filled 2013-09-04: qty 10

## 2013-09-04 MED ORDER — REGADENOSON 0.4 MG/5ML IV SOLN
INTRAVENOUS | Status: AC
  Administered 2013-09-04: 0.4 mg via INTRAVENOUS
  Filled 2013-09-04: qty 5

## 2013-09-04 MED ORDER — TECHNETIUM TC 99M SESTAMIBI - CARDIOLITE
10.0000 | Freq: Once | INTRAVENOUS | Status: AC | PRN
Start: 2013-09-04 — End: 2013-09-04
  Administered 2013-09-04: 07:00:00 10 via INTRAVENOUS

## 2013-09-04 MED ORDER — TECHNETIUM TC 99M SESTAMIBI GENERIC - CARDIOLITE
30.0000 | Freq: Once | INTRAVENOUS | Status: AC | PRN
  Administered 2013-09-04: 30 via INTRAVENOUS

## 2013-09-04 NOTE — Progress Notes (Signed)
Stress Lab Nurses Notes - Forestine Na  [id number] M Vernet 09/04/2013 Reason for doing test: CAD and Surgical Clearance Type of test: Wille Glaser Nurse performing test: Gerrit Halls, RN Nuclear Medicine Tech: Melburn Hake Echo Tech: Not Applicable MD performing test: Myles Gip /K.Armen Pickup MD: Legrand Rams Test explained and consent signed: yes IV started: 22g jelco, Saline lock flushed, No redness or edema and Saline lock started in radiology Symptoms: Nausea & stomach discomfort Treatment/Intervention: None Reason test stopped: protocol completed After recovery IV was: Discontinued via X-ray tech and No redness or edema Patient to return to [facility]. Med at : 9:15 Patient discharged: Home Patient's Condition upon discharge was: stable Comments: During test BP 81/55 & HR 77.  Recovery BP 83/51 & HR 68.  Symptoms resolved in recovery. Donnajean Lopes

## 2013-09-05 ENCOUNTER — Telehealth: Payer: Self-pay | Admitting: *Deleted

## 2013-09-05 NOTE — Telephone Encounter (Signed)
Patient informed. Copy sent to Dr. Sammuel Hines.

## 2013-09-05 NOTE — Telephone Encounter (Signed)
Message copied by Merlene Laughter on Fri Sep 05, 2013  1:40 PM ------      Message from: Satira Sark      Created: Fri Sep 05, 2013 12:51 PM       Reviewed. Study is consistent with known history of ischemic cardiomyopathy. Predominately shows scar, no large ischemic territories. LVEF calculated somewhat lower than most recent echocardiogram, however with him being clinically stable will continue medical therapy. Please forward copy of the stress test to Dr. Sammuel Hines at [facility] as part of the patient's preoperative evaluation. ------

## 2013-09-22 ENCOUNTER — Encounter: Payer: Self-pay | Admitting: Family

## 2013-09-22 DIAGNOSIS — E119 Type 2 diabetes mellitus without complications: Secondary | ICD-10-CM | POA: Diagnosis not present

## 2013-09-22 DIAGNOSIS — E669 Obesity, unspecified: Secondary | ICD-10-CM | POA: Diagnosis not present

## 2013-09-22 DIAGNOSIS — I10 Essential (primary) hypertension: Secondary | ICD-10-CM | POA: Diagnosis not present

## 2013-09-23 ENCOUNTER — Ambulatory Visit: Payer: Medicare Other | Admitting: Vascular Surgery

## 2013-09-23 ENCOUNTER — Encounter: Payer: Self-pay | Admitting: Family

## 2013-09-23 ENCOUNTER — Other Ambulatory Visit (HOSPITAL_COMMUNITY): Payer: Medicare Other

## 2013-09-23 ENCOUNTER — Ambulatory Visit (HOSPITAL_COMMUNITY)
Admission: RE | Admit: 2013-09-23 | Discharge: 2013-09-23 | Disposition: A | Discharge status: Home / Self Care | Payer: Medicare Other | Source: Ambulatory Visit | Attending: Vascular Surgery | Admitting: Vascular Surgery

## 2013-09-23 ENCOUNTER — Ambulatory Visit (INDEPENDENT_AMBULATORY_CARE_PROVIDER_SITE_OTHER): Payer: Medicare Other | Admitting: Family

## 2013-09-23 VITALS — BP 127/75 | HR 66 | Resp 20 | Ht 73.0 in | Wt 265.0 lb

## 2013-09-23 DIAGNOSIS — I714 Abdominal aortic aneurysm, without rupture, unspecified: Secondary | ICD-10-CM

## 2013-09-23 DIAGNOSIS — I724 Aneurysm of artery of lower extremity: Secondary | ICD-10-CM | POA: Diagnosis not present

## 2013-09-23 NOTE — Progress Notes (Signed)
Established Popliteal Aneurysm  History of Present Illness  [name] is a 68 y.o. ([birth year]) male patient of Dr. Donnetta [name] who has a known infrarenal abdominal aortic aneurysm; this is followed by Dr. Vinnie [name] in [place], pt saw Dr. Sammuel [name] in January, 2015., and states he was told the AAA was 5.8 cm according to a CT. Pt saw Dr. Domenic [name] for echocardiogram and stress test, will follow up with Dr. Domenic [name] on 10/02/13, per pt.; as possible cardiac risk assessment for AAA repair in [facility]. He returns today for evaluation of his left popliteal aneurysm. He has had no new episodes of congestive heart failure since his last visit. He does have a known moderate to large aneurysm is felt to be at high risk for open repair due to his cardiac difficulty. He does have a prior CT scan showing that his aneurysm begins at the [name] renal arteries. He does have a known history of right superficial artery occlusion and moderate size left distal superficial femoral artery and popliteal aneurysm  He has an intermittent sharp pain at anterior thigh for the last month or so, is non radiating; he denies claudication type symptoms; he does have pain in left knee when he tries to get out of a chair, this has not changed.  He thinks his DM is under better control, states his A1C has increased to 7.6 at the last check, but has since improved per pt. He states he lost 16 pounds since April 9 with effort. He states he was told that he had a stroke at age 31 or 31, none since then. He quit smoking in 2009. He takes a daily statin, daily ASA, no anticoagulants.   The patient's PMH, PSH, SH, FamHx, Med, and Allergies are unchanged from 03/18/13.  REVIEW OF SYSTEMS: see HPI for pertinent positives and negatives.   Physical Examination  Filed Vitals:   09/23/13 1339  BP: 127/75  Pulse: 66  Resp: 20  Height: 6\' 1"  (1.854 m)  Weight: 265 lb (120.203 kg)   Body mass index is 34.97  kg/(m^2).  PHYSICAL EXAMINATION: General: The patient appears their stated age, obese male.   HEENT:  No gross abnormalities Pulmonary: Respirations are non-labored Abdomen: Soft and non-tender. Musculoskeletal: There are no major deformities.   Neurologic: No focal weakness or paresthesias are detected, Skin: There are no ulcer or rashes noted. Psychiatric: The patient has normal affect. Cardiovascular: There is a regular rate and rhythm without significant murmur appreciated.   Vessel Right Left  Radial 2+Palpable 2+Palpable  Carotid without bruit  without bruit  Aorta Not palpable N/A  Femoral Palpable Palpable  Popliteal Not palpable Not palpable  PT Palpable Not Palpable  DP Palpable Palpable    Non-Invasive Vascular Imaging  (Date: 09/23/2013) LOWER EXTREMITY ARTERIAL DUPLEX EVALUATION      INDICATION: Popliteal aneurysm    PREVIOUS INTERVENTION(S):     DUPLEX EXAM:     Popliteal Artery Diameter   AP (cm) Transverse (cm)  Right    Left 2.83 (Distal SFA) 2.78 (Distal SFA)     ADDITIONAL FINDINGS: Biphasic Doppler waveforms noted in the left distal superficial femoral artery with partially-occlusive mural thrombus. No significant stenosis noted.    IMPRESSION: Aneurysmal dilatation of the left distal superficial femoral artery. No significant change in comparison to the last exam on 03/18/2013.    Medical Decision Making  [name] is a 68 y.o. male who presents with: left popliteal aneurysm that is asymptomatic  with no evidence of critical limb ischemia.  Based on the patient's vascular studies and examination, I have offered the patient: left lower extremity arterial Duplex in 6 months.  I discussed in depth with the patient the nature of atherosclerosis, and emphasized the importance of maximal medical management including strict control of blood pressure, blood glucose, and lipid levels, antiplatelet agents, obtaining regular exercise, and cessation  of smoking.    The patient is aware that without maximal medical management the underlying atherosclerotic disease process will progress, limiting the benefit of any interventions. The patient is currently on a statin.     The patient is currently on an anti-platelet: ASA.   Thank you for allowing [place] to participate in this patient's care.  Austin Leyden Nickel, RN, MSN, FNP-C [facility] Office: [phone]  09/23/2013, 2:07 PM  Clinic MD: Early

## 2013-10-02 DIAGNOSIS — I716 Thoracoabdominal aortic aneurysm, without rupture, unspecified: Secondary | ICD-10-CM | POA: Diagnosis not present

## 2013-10-02 DIAGNOSIS — I714 Abdominal aortic aneurysm, without rupture, unspecified: Secondary | ICD-10-CM | POA: Diagnosis not present

## 2013-10-02 DIAGNOSIS — E119 Type 2 diabetes mellitus without complications: Secondary | ICD-10-CM | POA: Diagnosis not present

## 2013-10-02 DIAGNOSIS — I10 Essential (primary) hypertension: Secondary | ICD-10-CM | POA: Diagnosis not present

## 2013-10-02 DIAGNOSIS — Z8489 Family history of other specified conditions: Secondary | ICD-10-CM | POA: Diagnosis not present

## 2013-10-02 DIAGNOSIS — K5730 Diverticulosis of large intestine without perforation or abscess without bleeding: Secondary | ICD-10-CM | POA: Diagnosis not present

## 2013-10-02 DIAGNOSIS — K429 Umbilical hernia without obstruction or gangrene: Secondary | ICD-10-CM | POA: Diagnosis not present

## 2013-10-02 DIAGNOSIS — I70209 Unspecified atherosclerosis of native arteries of extremities, unspecified extremity: Secondary | ICD-10-CM | POA: Diagnosis not present

## 2013-10-02 DIAGNOSIS — Z87891 Personal history of nicotine dependence: Secondary | ICD-10-CM | POA: Diagnosis not present

## 2013-10-02 DIAGNOSIS — N200 Calculus of kidney: Secondary | ICD-10-CM | POA: Diagnosis not present

## 2013-10-02 DIAGNOSIS — I2510 Atherosclerotic heart disease of native coronary artery without angina pectoris: Secondary | ICD-10-CM | POA: Diagnosis not present

## 2013-10-02 DIAGNOSIS — N281 Cyst of kidney, acquired: Secondary | ICD-10-CM | POA: Diagnosis not present

## 2013-10-02 DIAGNOSIS — E785 Hyperlipidemia, unspecified: Secondary | ICD-10-CM | POA: Diagnosis not present

## 2013-10-02 DIAGNOSIS — M4710 Other spondylosis with myelopathy, site unspecified: Secondary | ICD-10-CM | POA: Diagnosis not present

## 2013-10-02 DIAGNOSIS — N1330 Unspecified hydronephrosis: Secondary | ICD-10-CM | POA: Diagnosis not present

## 2013-10-31 ENCOUNTER — Encounter (HOSPITAL_COMMUNITY): Payer: Self-pay | Admitting: Emergency Medicine

## 2013-10-31 ENCOUNTER — Emergency Department (HOSPITAL_COMMUNITY): Payer: Medicare Other

## 2013-10-31 ENCOUNTER — Emergency Department (HOSPITAL_COMMUNITY)
Admission: EM | Admit: 2013-10-31 | Discharge: 2013-10-31 | Disposition: A | Discharge status: Other Acute Inpatient Hospital | Payer: Medicare Other | Attending: Emergency Medicine | Admitting: Emergency Medicine

## 2013-10-31 DIAGNOSIS — K429 Umbilical hernia without obstruction or gangrene: Secondary | ICD-10-CM | POA: Diagnosis not present

## 2013-10-31 DIAGNOSIS — I2589 Other forms of chronic ischemic heart disease: Secondary | ICD-10-CM | POA: Diagnosis not present

## 2013-10-31 DIAGNOSIS — Z7982 Long term (current) use of aspirin: Secondary | ICD-10-CM | POA: Insufficient documentation

## 2013-10-31 DIAGNOSIS — Z87891 Personal history of nicotine dependence: Secondary | ICD-10-CM | POA: Insufficient documentation

## 2013-10-31 DIAGNOSIS — I714 Abdominal aortic aneurysm, without rupture, unspecified: Secondary | ICD-10-CM | POA: Diagnosis not present

## 2013-10-31 DIAGNOSIS — I70219 Atherosclerosis of native arteries of extremities with intermittent claudication, unspecified extremity: Secondary | ICD-10-CM | POA: Diagnosis not present

## 2013-10-31 DIAGNOSIS — I739 Peripheral vascular disease, unspecified: Secondary | ICD-10-CM | POA: Diagnosis not present

## 2013-10-31 DIAGNOSIS — M545 Low back pain, unspecified: Secondary | ICD-10-CM | POA: Diagnosis not present

## 2013-10-31 DIAGNOSIS — G510 Bell's palsy: Secondary | ICD-10-CM | POA: Diagnosis not present

## 2013-10-31 DIAGNOSIS — Z872 Personal history of diseases of the skin and subcutaneous tissue: Secondary | ICD-10-CM | POA: Insufficient documentation

## 2013-10-31 DIAGNOSIS — I959 Hypotension, unspecified: Secondary | ICD-10-CM

## 2013-10-31 DIAGNOSIS — I4891 Unspecified atrial fibrillation: Secondary | ICD-10-CM | POA: Diagnosis not present

## 2013-10-31 DIAGNOSIS — I2510 Atherosclerotic heart disease of native coronary artery without angina pectoris: Secondary | ICD-10-CM | POA: Insufficient documentation

## 2013-10-31 DIAGNOSIS — I10 Essential (primary) hypertension: Secondary | ICD-10-CM | POA: Insufficient documentation

## 2013-10-31 DIAGNOSIS — M129 Arthropathy, unspecified: Secondary | ICD-10-CM | POA: Diagnosis not present

## 2013-10-31 DIAGNOSIS — Z87442 Personal history of urinary calculi: Secondary | ICD-10-CM | POA: Insufficient documentation

## 2013-10-31 DIAGNOSIS — I715 Thoracoabdominal aortic aneurysm, ruptured, unspecified: Secondary | ICD-10-CM | POA: Diagnosis not present

## 2013-10-31 DIAGNOSIS — I509 Heart failure, unspecified: Secondary | ICD-10-CM | POA: Diagnosis not present

## 2013-10-31 DIAGNOSIS — Z79899 Other long term (current) drug therapy: Secondary | ICD-10-CM | POA: Insufficient documentation

## 2013-10-31 DIAGNOSIS — G4733 Obstructive sleep apnea (adult) (pediatric): Secondary | ICD-10-CM | POA: Diagnosis not present

## 2013-10-31 DIAGNOSIS — I252 Old myocardial infarction: Secondary | ICD-10-CM | POA: Diagnosis not present

## 2013-10-31 DIAGNOSIS — Z885 Allergy status to narcotic agent status: Secondary | ICD-10-CM | POA: Diagnosis not present

## 2013-10-31 DIAGNOSIS — K219 Gastro-esophageal reflux disease without esophagitis: Secondary | ICD-10-CM | POA: Diagnosis not present

## 2013-10-31 DIAGNOSIS — Z8669 Personal history of other diseases of the nervous system and sense organs: Secondary | ICD-10-CM | POA: Diagnosis not present

## 2013-10-31 DIAGNOSIS — N200 Calculus of kidney: Secondary | ICD-10-CM | POA: Diagnosis not present

## 2013-10-31 DIAGNOSIS — R031 Nonspecific low blood-pressure reading: Secondary | ICD-10-CM | POA: Diagnosis not present

## 2013-10-31 DIAGNOSIS — E119 Type 2 diabetes mellitus without complications: Secondary | ICD-10-CM | POA: Insufficient documentation

## 2013-10-31 DIAGNOSIS — Z951 Presence of aortocoronary bypass graft: Secondary | ICD-10-CM | POA: Insufficient documentation

## 2013-10-31 DIAGNOSIS — D7389 Other diseases of spleen: Secondary | ICD-10-CM | POA: Diagnosis not present

## 2013-10-31 DIAGNOSIS — Z888 Allergy status to other drugs, medicaments and biological substances status: Secondary | ICD-10-CM | POA: Diagnosis not present

## 2013-10-31 DIAGNOSIS — K5730 Diverticulosis of large intestine without perforation or abscess without bleeding: Secondary | ICD-10-CM | POA: Diagnosis not present

## 2013-10-31 DIAGNOSIS — I6529 Occlusion and stenosis of unspecified carotid artery: Secondary | ICD-10-CM | POA: Insufficient documentation

## 2013-10-31 DIAGNOSIS — R42 Dizziness and giddiness: Secondary | ICD-10-CM | POA: Diagnosis not present

## 2013-10-31 DIAGNOSIS — M549 Dorsalgia, unspecified: Secondary | ICD-10-CM | POA: Diagnosis not present

## 2013-10-31 DIAGNOSIS — N281 Cyst of kidney, acquired: Secondary | ICD-10-CM | POA: Diagnosis not present

## 2013-10-31 DIAGNOSIS — M479 Spondylosis, unspecified: Secondary | ICD-10-CM | POA: Diagnosis not present

## 2013-10-31 LAB — COMPREHENSIVE METABOLIC PANEL
ALT: 33 U/L (ref 0–53)
AST: 39 U/L — ABNORMAL HIGH (ref 0–37)
Albumin: 3.6 g/dL (ref 3.5–5.2)
Alkaline Phosphatase: 59 U/L (ref 39–117)
BUN: 31 mg/dL — ABNORMAL HIGH (ref 6–23)
CALCIUM: 9.3 mg/dL (ref 8.4–10.5)
CO2: 28 meq/L (ref 19–32)
CREATININE: 1.55 mg/dL — AB (ref 0.50–1.35)
Chloride: 99 mEq/L (ref 96–112)
GFR, EST AFRICAN AMERICAN: 53 mL/min — AB (ref >90)
GFR, EST NON AFRICAN AMERICAN: 45 mL/min — AB (ref >90)
Glucose, Bld: 138 mg/dL — ABNORMAL HIGH (ref 70–99)
Potassium: 4.7 mEq/L (ref 3.7–5.3)
SODIUM: 139 meq/L (ref 137–147)
TOTAL PROTEIN: 7.6 g/dL (ref 6.0–8.3)
Total Bilirubin: 0.2 mg/dL — ABNORMAL LOW (ref 0.3–1.2)

## 2013-10-31 LAB — URINALYSIS, ROUTINE W REFLEX MICROSCOPIC
Bilirubin Urine: NEGATIVE
Glucose, UA: NEGATIVE mg/dL
HGB URINE DIPSTICK: NEGATIVE
Ketones, ur: NEGATIVE mg/dL
LEUKOCYTES UA: NEGATIVE
Nitrite: NEGATIVE
Protein, ur: NEGATIVE mg/dL
SPECIFIC GRAVITY, URINE: 1.015 (ref 1.005–1.030)
UROBILINOGEN UA: 0.2 mg/dL (ref 0.0–1.0)
pH: 5.5 (ref 5.0–8.0)

## 2013-10-31 LAB — CBC WITH DIFFERENTIAL/PLATELET
BASOS PCT: 1 % (ref 0–1)
Basophils Absolute: 0.1 10*3/uL (ref 0.0–0.1)
EOS ABS: 0.3 10*3/uL (ref 0.0–0.7)
EOS PCT: 4 % (ref 0–5)
HCT: 42.7 % (ref 39.0–52.0)
Hemoglobin: 14.1 g/dL (ref 13.0–17.0)
Lymphocytes Relative: 27 % (ref 12–46)
Lymphs Abs: 2.2 10*3/uL (ref 0.7–4.0)
MCH: 31.8 pg (ref 26.0–34.0)
MCHC: 33 g/dL (ref 30.0–36.0)
MCV: 96.2 fL (ref 78.0–100.0)
MONOS PCT: 13 % — AB (ref 3–12)
Monocytes Absolute: 1.1 10*3/uL — ABNORMAL HIGH (ref 0.1–1.0)
NEUTROS PCT: 56 % (ref 43–77)
Neutro Abs: 4.5 10*3/uL (ref 1.7–7.7)
Platelets: 300 10*3/uL (ref 150–400)
RBC: 4.44 MIL/uL (ref 4.22–5.81)
RDW: 13.2 % (ref 11.5–15.5)
WBC: 8.1 10*3/uL (ref 4.0–10.5)

## 2013-10-31 LAB — LACTIC ACID, PLASMA: Lactic Acid, Venous: 1.4 mmol/L (ref 0.5–2.2)

## 2013-10-31 MED ORDER — SODIUM CHLORIDE 0.9 % IV BOLUS (SEPSIS): 1000.0000 mL | Freq: Once | INTRAVENOUS | Status: DC

## 2013-10-31 MED ORDER — SODIUM CHLORIDE 0.9 % IV BOLUS (SEPSIS)
500.0000 mL | Freq: Once | INTRAVENOUS | Status: AC
  Administered 2013-10-31: 500 mL via INTRAVENOUS

## 2013-10-31 MED ORDER — IOHEXOL 350 MG/ML SOLN
80.0000 mL | Freq: Once | INTRAVENOUS | Status: AC | PRN
  Administered 2013-10-31: 80 mL via INTRAVENOUS

## 2013-10-31 NOTE — ED Provider Notes (Signed)
CSN: [id number]     Arrival date & time 10/31/13  1058 History  This chart was scribed for Merryl Hacker, MD by Roe Coombs, ED Scribe. The patient was seen in room APA08/APA08. Patient's care was started at 11:24 AM.  Chief Complaint  Patient presents with  . Weakness    The history is provided by the patient. No language interpreter was used.   HPI Comments: [name] is a 68 y.o. male who presents to the Emergency Department complaining of generalized weakness and hypotension. Patient states that he has felt weak for the past 2 days. Today he also developed pain in his left lower back which he describes as tightness. He states that his blood pressure is usually 110/69, but today it was about 86/46 when he checked at home. He typically checks his blood pressure daily. He denies nausea, vomiting, shortness of breath, abdominal pain, chest pain. He has a medical history of an AAA for which he will have surgery in 12/08/13. He has no history of renal disease. His other medical history includes DM, MI, atrial fibrillation, and CABG.   Past Medical History  Diagnosis Date  . Ischemic cardiomyopathy     LVEF 45-50% 11/2011  . Coronary atherosclerosis of native coronary artery     Multivessel status post CABG  . Angioedema     Secondary to ACE inhibitor  . Bell palsy   . Type 2 diabetes mellitus   . AAA (abdominal aortic aneurysm)     Followed by Dr. Donnetta Hutching  . Essential hypertension, benign   . PAD (peripheral artery disease)     Followed by Dr. Donnetta Hutching  . Sleep apnea   . Arthritis   . Carotid artery disease   . Atrial fibrillation   . Myocardial infarction 2001  . Nephrolithiasis    Past Surgical History  Procedure Laterality Date  . Coronary artery bypass graft      LIMA to LAD, SVG to diagonal and ramus, SVG to OM, SVG to AM  . Hand surgery  2009  . Cataract extraction w/phaco  03/23/2011    Procedure: CATARACT EXTRACTION PHACO AND INTRAOCULAR LENS PLACEMENT (IOC);   Surgeon: Tonny Branch;  Location: [facility];  Service: Ophthalmology;  Laterality: Left;  CDE: 15.99  . Cataract extraction w/phaco  04/17/2011    Procedure: CATARACT EXTRACTION PHACO AND INTRAOCULAR LENS PLACEMENT (IOC);  Surgeon: Tonny Branch;  Location: [facility];  Service: Ophthalmology;  Laterality: Right;  CDE: 23.45  . Foot surgery Left 1963   Family History  Problem Relation Age of Onset  . Diabetes Mother   . Heart disease Father 31  . AAA (abdominal aortic aneurysm) Father   . Hyperlipidemia Father   . Hypertension Father    History  Substance Use Topics  . Smoking status: Former Smoker -- 1.00 packs/day for 25 years    Types: Cigarettes    Quit date: 05/23/2007  . Smokeless tobacco: Never Used  . Alcohol Use: No    Review of Systems  Constitutional: Positive for fatigue. Negative for fever.  Respiratory: Negative.  Negative for chest tightness and shortness of breath.   Cardiovascular: Negative.  Negative for chest pain.  Gastrointestinal: Negative.  Negative for nausea, vomiting and abdominal pain.  Genitourinary: Negative.  Negative for dysuria.  Musculoskeletal: Positive for back pain.  Neurological: Positive for dizziness and weakness. Negative for numbness and headaches.  All other systems reviewed and are negative.     Allergies  Ace inhibitors; Lisinopril; Codeine;  and Hydromorphone  Home Medications   Prior to Admission medications   Medication Sig Start Date End Date Taking? Authorizing Provider  allopurinol (ZYLOPRIM) 100 MG tablet Take 100 mg by mouth daily.     Yes Historical Provider, MD  allopurinol (ZYLOPRIM) 100 MG tablet Take 100 mg by mouth daily.   Yes Historical Provider, MD  aspirin EC 81 MG tablet Take 81 mg by mouth daily.   Yes Historical Provider, MD  carvedilol (COREG) 25 MG tablet Take 25 mg by mouth 2 (two) times daily with a meal.   Yes Historical Provider, MD  furosemide (LASIX) 40 MG tablet Take 40 mg by mouth 2 (two) times daily.    Yes  Historical Provider, MD  gabapentin (NEURONTIN) 300 MG capsule Take 300 mg by mouth 2 (two) times daily.   Yes Historical Provider, MD  glipiZIDE (GLUCOTROL) 10 MG tablet Take 10 mg by mouth 2 (two) times daily before a meal.    Yes Historical Provider, MD  isosorbide mononitrate (IMDUR) 60 MG 24 hr tablet Take 180 mg by mouth daily.   Yes Historical Provider, MD  linagliptin (TRADJENTA) 5 MG TABS tablet Take 5 mg by mouth daily.     Yes Historical Provider, MD  metFORMIN (GLUCOPHAGE) 1000 MG tablet Take 1,000 mg by mouth 2 (two) times daily with a meal.     Yes Historical Provider, MD  nitroGLYCERIN (NITROSTAT) 0.4 MG SL tablet Place 0.4 mg under the tongue every 5 (five) minutes as needed for chest pain.   Yes Historical Provider, MD  simvastatin (ZOCOR) 40 MG tablet Take 40 mg by mouth at bedtime.   Yes Historical Provider, MD   BP 114/58  Pulse 71  Temp(Src) 98 F (36.7 C)  Resp 16  Ht 6\' 1"  (1.854 m)  Wt 248 lb (112.492 kg)  BMI 32.73 kg/m2  SpO2 95% Physical Exam  Nursing note and vitals reviewed. Constitutional: He is oriented to person, place, and time. He appears well-developed. No distress.  HENT:  Head: Normocephalic and atraumatic.  Mouth/Throat: Oropharynx is clear and moist.  Cardiovascular: Normal rate, regular rhythm and normal heart sounds.   No murmur heard. Pulmonary/Chest: Effort normal and breath sounds normal. No respiratory distress. He has no wheezes.  Abdominal: Soft. Bowel sounds are normal. He exhibits no distension. There is no tenderness. There is no rebound.  protuberant  Musculoskeletal: He exhibits no edema.  No tenderness to palpation over the paraspinous muscles of the T. or L-spine, no midline tenderness to palpation  Lymphadenopathy:    He has no cervical adenopathy.  Neurological: He is alert and oriented to person, place, and time.  5/5 strength all 4 extremities  Skin: Skin is warm and dry.  Psychiatric: He has a normal mood and affect.     ED Course  Procedures (including critical care time)  CRITICAL CARE Performed by: Thayer Jew, F   Total critical care time: 40 min  Critical care time was exclusive of separately billable procedures and treating other patients.  Critical care was necessary to treat or prevent imminent or life-threatening deterioration.  Critical care was time spent personally by me on the following activities: development of treatment plan with patient and/or surrogate as well as nursing, discussions with consultants, evaluation of patient's response to treatment, examination of patient, obtaining history from patient or surrogate, ordering and performing treatments and interventions, ordering and review of laboratory studies, ordering and review of radiographic studies, pulse oximetry and re-evaluation of patient's condition.  DIAGNOSTIC STUDIES: Oxygen Saturation is 95% on room air, adequate by my interpretation.    COORDINATION OF CARE: 11:30 AM- Patient informed of current plan for treatment and evaluation and agrees with plan at this time.     Labs Review Labs Reviewed  CBC WITH DIFFERENTIAL - Abnormal; Notable for the following:    Monocytes Relative 13 (*)    Monocytes Absolute 1.1 (*)    All other components within normal limits  COMPREHENSIVE METABOLIC PANEL - Abnormal; Notable for the following:    Glucose, Bld 138 (*)    BUN 31 (*)    Creatinine, Ser 1.55 (*)    AST 39 (*)    Total Bilirubin 0.2 (*)    GFR calc non Af Amer 45 (*)    GFR calc Af Amer 53 (*)    All other components within normal limits  LACTIC ACID, PLASMA  URINALYSIS, ROUTINE W REFLEX MICROSCOPIC    Imaging Review No results found.   EKG Interpretation   Date/Time:  Friday October 31 2013 11:21:15 EDT Ventricular Rate:  64 PR Interval:  177 QRS Duration: 113 QT Interval:  406 QTC Calculation: 419 R Axis:   71 Text Interpretation:  Sinus rhythm Inferior infarct, old No significant  change  since last tracing Confirmed by Urijah Raynor  MD, Kyal Arts ([id number]) on  10/31/2013 11:31:33 AM      MDM   Final diagnoses:  Back pain  Hypotension  AAA (abdominal aortic aneurysm) without rupture    Patient presents with generalized weakness, reports hypertension, and back pain. He is nontoxic and nonfocal on exam. Currently his blood pressure is 114/58. He reports systolics in the [id number] at home. He states he feels generally weak and has back pain. Known history of AAA with repair slated for July 2015.   Workup initially notable for stable hematocrit at 42.7.  Patient does have evidence of acute kidney injury with a creatinine of 1.55 and elevated BUN at 31. Patient was given 500 cc of fluid. Lactate is normal. Given elevated creatinine, ultrasound of the aorta was obtained and is concerning for further dilation of the AAA. Patient reports aneurysmal diameter of 5.8 which appears consistent with radiology reports and care everywhere.  CTA of the abdomen to followup ultrasound obtained.  CTA shows interval increase of aortic aneurysm from our prior study in April of 2014. There is no evidence of leak or dissection. There is worsening thrombus. Patient did recently have a CTA in may of 2015 but I do not have access to this imaging. Patient's blood pressures have run between 90 and [id number] systolic. He has been otherwise stable. Concern that the patient's back pain may be related to dilating aneurysm.  Discussed with Dr. Sammuel Hines at [facility]. He has no way of comparing imaging at this time. Will transfer the patient for evaluation in the emergency room. Patient was advised that he may not be admitted aneurysm appears stable and patient remained stable.  I personally performed the services described in this documentation, which was scribed in my presence. The recorded information has been reviewed and is accurate.   Merryl Hacker, MD 11/02/13 [phone]

## 2013-10-31 NOTE — ED Notes (Addendum)
Has felt weak for 2 days, Bp low today, No tarry stools. Pt has AAA and plans surgery in July.No NVD  Pale .  Checked his bp and it was low  Intermittent discomfort in low back.

## 2013-11-01 DIAGNOSIS — I714 Abdominal aortic aneurysm, without rupture, unspecified: Secondary | ICD-10-CM | POA: Diagnosis not present

## 2013-11-01 DIAGNOSIS — R031 Nonspecific low blood-pressure reading: Secondary | ICD-10-CM | POA: Diagnosis not present

## 2013-11-03 ENCOUNTER — Telehealth: Payer: Self-pay | Admitting: *Deleted

## 2013-11-03 NOTE — Telephone Encounter (Signed)
Patient called because he has been having problems with his blood pressure dropping too low. Patient said he saw Dr. Sammuel Hines at [facility] last week and patient was told to decrease Imdur to 120 mg daily and contact our office this week. Patient said his BP is still dropping and he feels weak and dizzy when it gets low. BP readings are around 86/44 after taking his medications. Patient said his blood pressure does come up to normal after a little while. Patient said he has lost 24 lbs since April 9th that was intentional. Patient given an appointment to see MD on 07/14 and also informed that information would be sent to his doctor.

## 2013-11-04 NOTE — Telephone Encounter (Signed)
If his blood pressures is dropping that low, we should make some changes in his medications before his office visit. The only other culprit would be Coreg. Please cut back to 12.5 mg twice daily.

## 2013-11-04 NOTE — Telephone Encounter (Signed)
Patient informed and will use his currently supply of carvedilol breaking it in half twice daily.

## 2013-11-05 DIAGNOSIS — I509 Heart failure, unspecified: Secondary | ICD-10-CM | POA: Diagnosis not present

## 2013-11-05 DIAGNOSIS — I70219 Atherosclerosis of native arteries of extremities with intermittent claudication, unspecified extremity: Secondary | ICD-10-CM | POA: Diagnosis not present

## 2013-11-05 DIAGNOSIS — I722 Aneurysm of renal artery: Secondary | ICD-10-CM | POA: Diagnosis not present

## 2013-11-05 DIAGNOSIS — E119 Type 2 diabetes mellitus without complications: Secondary | ICD-10-CM | POA: Diagnosis not present

## 2013-11-05 DIAGNOSIS — Q638 Other specified congenital malformations of kidney: Secondary | ICD-10-CM | POA: Diagnosis not present

## 2013-11-05 DIAGNOSIS — I714 Abdominal aortic aneurysm, without rupture, unspecified: Secondary | ICD-10-CM | POA: Diagnosis not present

## 2013-11-05 DIAGNOSIS — I252 Old myocardial infarction: Secondary | ICD-10-CM | POA: Diagnosis not present

## 2013-11-05 DIAGNOSIS — I2510 Atherosclerotic heart disease of native coronary artery without angina pectoris: Secondary | ICD-10-CM | POA: Diagnosis not present

## 2013-11-05 DIAGNOSIS — G4733 Obstructive sleep apnea (adult) (pediatric): Secondary | ICD-10-CM | POA: Diagnosis not present

## 2013-11-05 DIAGNOSIS — Z885 Allergy status to narcotic agent status: Secondary | ICD-10-CM | POA: Diagnosis not present

## 2013-11-05 DIAGNOSIS — I10 Essential (primary) hypertension: Secondary | ICD-10-CM | POA: Diagnosis not present

## 2013-11-10 ENCOUNTER — Encounter (HOSPITAL_COMMUNITY): Payer: Self-pay | Admitting: Emergency Medicine

## 2013-11-10 ENCOUNTER — Emergency Department (HOSPITAL_COMMUNITY)
Admission: EM | Admit: 2013-11-10 | Discharge: 2013-11-10 | Disposition: A | Discharge status: Home / Self Care | Payer: Medicare Other | Attending: Emergency Medicine | Admitting: Emergency Medicine

## 2013-11-10 ENCOUNTER — Emergency Department (HOSPITAL_COMMUNITY): Payer: Medicare Other

## 2013-11-10 DIAGNOSIS — G510 Bell's palsy: Secondary | ICD-10-CM | POA: Diagnosis not present

## 2013-11-10 DIAGNOSIS — Z951 Presence of aortocoronary bypass graft: Secondary | ICD-10-CM | POA: Insufficient documentation

## 2013-11-10 DIAGNOSIS — K5900 Constipation, unspecified: Secondary | ICD-10-CM | POA: Diagnosis not present

## 2013-11-10 DIAGNOSIS — Z87828 Personal history of other (healed) physical injury and trauma: Secondary | ICD-10-CM | POA: Diagnosis not present

## 2013-11-10 DIAGNOSIS — M129 Arthropathy, unspecified: Secondary | ICD-10-CM | POA: Insufficient documentation

## 2013-11-10 DIAGNOSIS — Z7982 Long term (current) use of aspirin: Secondary | ICD-10-CM | POA: Insufficient documentation

## 2013-11-10 DIAGNOSIS — I2510 Atherosclerotic heart disease of native coronary artery without angina pectoris: Secondary | ICD-10-CM | POA: Diagnosis not present

## 2013-11-10 DIAGNOSIS — Z79899 Other long term (current) drug therapy: Secondary | ICD-10-CM | POA: Insufficient documentation

## 2013-11-10 DIAGNOSIS — Z87891 Personal history of nicotine dependence: Secondary | ICD-10-CM | POA: Insufficient documentation

## 2013-11-10 DIAGNOSIS — Z87442 Personal history of urinary calculi: Secondary | ICD-10-CM | POA: Diagnosis not present

## 2013-11-10 DIAGNOSIS — I252 Old myocardial infarction: Secondary | ICD-10-CM | POA: Diagnosis not present

## 2013-11-10 DIAGNOSIS — E119 Type 2 diabetes mellitus without complications: Secondary | ICD-10-CM | POA: Insufficient documentation

## 2013-11-10 DIAGNOSIS — N289 Disorder of kidney and ureter, unspecified: Secondary | ICD-10-CM

## 2013-11-10 DIAGNOSIS — K5901 Slow transit constipation: Secondary | ICD-10-CM

## 2013-11-10 DIAGNOSIS — I10 Essential (primary) hypertension: Secondary | ICD-10-CM | POA: Insufficient documentation

## 2013-11-10 LAB — CBC WITH DIFFERENTIAL/PLATELET
Basophils Absolute: 0.1 10*3/uL (ref 0.0–0.1)
Basophils Relative: 0 % (ref 0–1)
Eosinophils Absolute: 0.4 10*3/uL (ref 0.0–0.7)
Eosinophils Relative: 3 % (ref 0–5)
HCT: 37.5 % — ABNORMAL LOW (ref 39.0–52.0)
Hemoglobin: 12.6 g/dL — ABNORMAL LOW (ref 13.0–17.0)
Lymphocytes Relative: 26 % (ref 12–46)
Lymphs Abs: 3.4 10*3/uL (ref 0.7–4.0)
MCH: 31.7 pg (ref 26.0–34.0)
MCHC: 33.6 g/dL (ref 30.0–36.0)
MCV: 94.2 fL (ref 78.0–100.0)
Monocytes Absolute: 2 10*3/uL — ABNORMAL HIGH (ref 0.1–1.0)
Monocytes Relative: 15 % — ABNORMAL HIGH (ref 3–12)
Neutro Abs: 7.6 10*3/uL (ref 1.7–7.7)
Neutrophils Relative %: 56 % (ref 43–77)
Platelets: 414 10*3/uL — ABNORMAL HIGH (ref 150–400)
RBC: 3.98 MIL/uL — ABNORMAL LOW (ref 4.22–5.81)
RDW: 13.1 % (ref 11.5–15.5)
WBC: 13.5 10*3/uL — ABNORMAL HIGH (ref 4.0–10.5)

## 2013-11-10 LAB — BASIC METABOLIC PANEL WITH GFR
BUN: 36 mg/dL — ABNORMAL HIGH (ref 6–23)
CO2: 25 meq/L (ref 19–32)
Calcium: 9 mg/dL (ref 8.4–10.5)
Chloride: 100 meq/L (ref 96–112)
Creatinine, Ser: 1.57 mg/dL — ABNORMAL HIGH (ref 0.50–1.35)
GFR calc Af Amer: 52 mL/min — ABNORMAL LOW
GFR calc non Af Amer: 45 mL/min — ABNORMAL LOW
Glucose, Bld: 112 mg/dL — ABNORMAL HIGH (ref 70–99)
Potassium: 4 meq/L (ref 3.7–5.3)
Sodium: 139 meq/L (ref 137–147)

## 2013-11-10 LAB — POC OCCULT BLOOD, ED: Fecal Occult Bld: NEGATIVE

## 2013-11-10 MED ORDER — POLYETHYLENE GLYCOL 3350 17 G PO PACK: 17.0000 g | PACK | Freq: Every day | ORAL | Status: DC

## 2013-11-10 NOTE — Discharge Instructions (Signed)

## 2013-11-10 NOTE — ED Notes (Signed)
Pt states his last BM was 1 week ago. Pt also thinks he was passing a stone on Wednesday night and now is having trouble urinating. Pt states he is having a aneurysm repaired on July 1 and they have to close off some arteries in his kidneys.

## 2013-11-10 NOTE — ED Provider Notes (Signed)
CSN: [id number]     Arrival date & time 11/10/13  0432 History   First MD Initiated Contact with Patient 11/10/13 [phone]     Chief Complaint - constipation   Patient is a 68 y.o. male presenting with constipation. The history is provided by the patient.  Constipation Severity:  Moderate Time since last bowel movement:  1 week Timing:  Constant Progression:  Unchanged Chronicity:  New Stool description:  None produced Relieved by:  Nothing Worsened by:  Nothing tried Ineffective treatments:  Enemas and laxatives Associated symptoms: no abdominal pain, no back pain, no fever and no vomiting   pt reports constipation for one week It is not responding to enemas He also reports difficulty urinating for over a day.  He is only passing minimal urine despite increased fluid intake.  No fever/vomiting No new weakness.   He reports recent angiogram at [facility] for preparation for AAA repair on July 1.  Pt had accessory renal artery embolization last week per [facility].  He reports he did have abdominal pain last week which he felt was a kidney stone but is now resolved, this was after his procedure.   Past Medical History  Diagnosis Date  . Ischemic cardiomyopathy     LVEF 45-50% 11/2011  . Coronary atherosclerosis of native coronary artery     Multivessel status post CABG  . Angioedema     Secondary to ACE inhibitor  . Bell palsy   . Type 2 diabetes mellitus   . AAA (abdominal aortic aneurysm)     Followed by Dr. Donnetta Hutching  . Essential hypertension, benign   . PAD (peripheral artery disease)     Followed by Dr. Donnetta Hutching  . Sleep apnea   . Arthritis   . Carotid artery disease   . Atrial fibrillation   . Myocardial infarction 2001  . Nephrolithiasis    Past Surgical History  Procedure Laterality Date  . Coronary artery bypass graft      LIMA to LAD, SVG to diagonal and ramus, SVG to OM, SVG to AM  . Hand surgery  2009  . Cataract extraction w/phaco  03/23/2011    Procedure: CATARACT  EXTRACTION PHACO AND INTRAOCULAR LENS PLACEMENT (IOC);  Surgeon: Tonny Branch;  Location: [facility];  Service: Ophthalmology;  Laterality: Left;  CDE: 15.99  . Cataract extraction w/phaco  04/17/2011    Procedure: CATARACT EXTRACTION PHACO AND INTRAOCULAR LENS PLACEMENT (IOC);  Surgeon: Tonny Branch;  Location: [facility];  Service: Ophthalmology;  Laterality: Right;  CDE: 23.45  . Foot surgery Left 1963   Family History  Problem Relation Age of Onset  . Diabetes Mother   . Heart disease Father 31  . AAA (abdominal aortic aneurysm) Father   . Hyperlipidemia Father   . Hypertension Father    History  Substance Use Topics  . Smoking status: Former Smoker -- 1.00 packs/day for 25 years    Types: Cigarettes    Quit date: 05/23/2007  . Smokeless tobacco: Never Used  . Alcohol Use: No    Review of Systems  Constitutional: Negative for fever.  Gastrointestinal: Positive for constipation. Negative for vomiting and abdominal pain.  Genitourinary: Positive for difficulty urinating.  Musculoskeletal: Negative for back pain.  Neurological: Negative for syncope.  All other systems reviewed and are negative.     Allergies  Ace inhibitors; Lisinopril; Codeine; and Hydromorphone  Home Medications   Prior to Admission medications   Medication Sig Start Date End Date Taking? Authorizing Provider  allopurinol (ZYLOPRIM)  100 MG tablet Take 100 mg by mouth daily.    Historical Provider, MD  aspirin EC 81 MG tablet Take 81 mg by mouth daily.    Historical Provider, MD  carvedilol (COREG) 25 MG tablet Take 12.5 mg by mouth 2 (two) times daily with a meal.     Historical Provider, MD  furosemide (LASIX) 40 MG tablet Take 40 mg by mouth 2 (two) times daily.     Historical Provider, MD  gabapentin (NEURONTIN) 300 MG capsule Take 300 mg by mouth 2 (two) times daily.    Historical Provider, MD  glipiZIDE (GLUCOTROL) 10 MG tablet Take 10 mg by mouth 2 (two) times daily before a meal.     Historical Provider, MD   isosorbide mononitrate (IMDUR) 60 MG 24 hr tablet Take 120 mg by mouth daily.     Historical Provider, MD  linagliptin (TRADJENTA) 5 MG TABS tablet Take 5 mg by mouth daily.      Historical Provider, MD  metFORMIN (GLUCOPHAGE) 1000 MG tablet Take 1,000 mg by mouth 2 (two) times daily with a meal.      Historical Provider, MD  nitroGLYCERIN (NITROSTAT) 0.4 MG SL tablet Place 0.4 mg under the tongue every 5 (five) minutes as needed for chest pain.    Historical Provider, MD  simvastatin (ZOCOR) 40 MG tablet Take 40 mg by mouth at bedtime.    Historical Provider, MD   BP 153/84  Pulse 76  Temp(Src) 98.7 F (37.1 C) (Oral)  Resp 20  Wt 248 lb (112.492 kg)  SpO2 99% Physical Exam CONSTITUTIONAL: Well developed/well nourished HEAD: Normocephalic/atraumatic EYES: EOMI/PERRL ENMT: Mucous membranes moist NECK: supple no meningeal signs SPINE:entire spine nontender CV: S1/S2 noted, no murmurs/rubs/gallops noted LUNGS: Lungs are clear to auscultation bilaterally, no apparent distress ABDOMEN: soft, nontender, no rebound or guarding He is obese.  +BS throughout abdomen.  No focal tenderness noted.  Easily reducible ventral hernia.   Rectal - no stool impaction noted.  No rectal mass.  Stool is brown.  Chaperone present GU:no cva tenderness NEURO: Pt is awake/alert, moves all extremitiesx4 EXTREMITIES: pulses normal, full ROM SKIN: warm, color normal. Bandage to right inguinal region noted.  No hematoma noted.  No thrill noted PSYCH: no abnormalities of mood noted  ED Course  Procedures  5:15 AM Very low suspicion for acute abdominal process/obstruction.  However given patient clinical concern will obtain abdominal film.  Also reports decreased urine output despite fluid intake.  Will check electrolytes.  Bladder scan did not reveal any signs of acute urinary retention here in the ED 6:22 AM Pt resting comfortably, no distress We discussed imaging/labs  Advised use of metamucil this week  for his constipation If he is still constipated by this weekend (currently Monday) he will use the miralax I have prescribed  Labs Review Labs Reviewed  BASIC METABOLIC PANEL - Abnormal; Notable for the following:    Glucose, Bld 112 (*)    BUN 36 (*)    Creatinine, Ser 1.57 (*)    GFR calc non Af Amer 45 (*)    GFR calc Af Amer 52 (*)    All other components within normal limits  CBC WITH DIFFERENTIAL - Abnormal; Notable for the following:    WBC 13.5 (*)    RBC 3.98 (*)    Hemoglobin 12.6 (*)    HCT 37.5 (*)    Platelets 414 (*)    Monocytes Relative 15 (*)    Monocytes Absolute 2.0 (*)  All other components within normal limits  POC OCCULT BLOOD, ED    Imaging Review Dg Abd 1 View  11/10/2013   CLINICAL DATA:  Constipation for 1 week. Right flank pain and difficulty urinating.  EXAM: ABDOMEN - 1 VIEW  COMPARISON:  CT abdomen and pelvis 10/31/2013  FINDINGS: 70 mm calcification in the right abdomen likely corresponds to large stone seen in the right renal pelvis on previous CT scan. Scattered gas and stool in the colon. No small or large bowel distention. Degenerative changes in the spine. Probable metallic structure demonstrated in the left abdomen may represent extrinsic structure or clip.  IMPRESSION: Nonobstructive bowel gas pattern. Calcification in the right abdomen may correspond to stone on prior CT scan.   Electronically Signed   By: Lucienne Capers M.D.   On: 11/10/2013 05:59      MDM   Final diagnoses:  Slow transit constipation  Renal insufficiency    Nursing notes including past medical history and social history reviewed and considered in documentation Previous records reviewed and considered xrays reviewed and considered Labs/vital reviewed and considered     Sharyon Cable, MD 11/10/13 [id number]

## 2013-11-11 DIAGNOSIS — I743 Embolism and thrombosis of arteries of the lower extremities: Secondary | ICD-10-CM | POA: Diagnosis not present

## 2013-11-11 DIAGNOSIS — I716 Thoracoabdominal aortic aneurysm, without rupture, unspecified: Secondary | ICD-10-CM | POA: Diagnosis not present

## 2013-11-11 DIAGNOSIS — I714 Abdominal aortic aneurysm, without rupture, unspecified: Secondary | ICD-10-CM | POA: Diagnosis not present

## 2013-11-11 DIAGNOSIS — I10 Essential (primary) hypertension: Secondary | ICD-10-CM | POA: Diagnosis not present

## 2013-11-11 DIAGNOSIS — E785 Hyperlipidemia, unspecified: Secondary | ICD-10-CM | POA: Diagnosis not present

## 2013-11-11 DIAGNOSIS — Z87891 Personal history of nicotine dependence: Secondary | ICD-10-CM | POA: Diagnosis not present

## 2013-11-11 DIAGNOSIS — Z01818 Encounter for other preprocedural examination: Secondary | ICD-10-CM | POA: Diagnosis not present

## 2013-11-11 DIAGNOSIS — E119 Type 2 diabetes mellitus without complications: Secondary | ICD-10-CM | POA: Diagnosis not present

## 2013-11-11 DIAGNOSIS — Z01810 Encounter for preprocedural cardiovascular examination: Secondary | ICD-10-CM | POA: Diagnosis not present

## 2013-11-11 DIAGNOSIS — J438 Other emphysema: Secondary | ICD-10-CM | POA: Diagnosis not present

## 2013-11-11 DIAGNOSIS — I2510 Atherosclerotic heart disease of native coronary artery without angina pectoris: Secondary | ICD-10-CM | POA: Diagnosis not present

## 2013-11-11 DIAGNOSIS — I359 Nonrheumatic aortic valve disorder, unspecified: Secondary | ICD-10-CM | POA: Diagnosis not present

## 2013-11-18 DIAGNOSIS — Z951 Presence of aortocoronary bypass graft: Secondary | ICD-10-CM | POA: Diagnosis not present

## 2013-11-18 DIAGNOSIS — Z794 Long term (current) use of insulin: Secondary | ICD-10-CM | POA: Diagnosis not present

## 2013-11-18 DIAGNOSIS — I2510 Atherosclerotic heart disease of native coronary artery without angina pectoris: Secondary | ICD-10-CM | POA: Diagnosis present

## 2013-11-18 DIAGNOSIS — I5022 Chronic systolic (congestive) heart failure: Secondary | ICD-10-CM | POA: Diagnosis present

## 2013-11-18 DIAGNOSIS — Z01818 Encounter for other preprocedural examination: Secondary | ICD-10-CM | POA: Diagnosis not present

## 2013-11-18 DIAGNOSIS — I509 Heart failure, unspecified: Secondary | ICD-10-CM | POA: Diagnosis present

## 2013-11-18 DIAGNOSIS — I724 Aneurysm of artery of lower extremity: Secondary | ICD-10-CM | POA: Diagnosis not present

## 2013-11-18 DIAGNOSIS — I716 Thoracoabdominal aortic aneurysm, without rupture, unspecified: Secondary | ICD-10-CM | POA: Diagnosis not present

## 2013-11-18 DIAGNOSIS — M109 Gout, unspecified: Secondary | ICD-10-CM | POA: Diagnosis present

## 2013-11-18 DIAGNOSIS — R109 Unspecified abdominal pain: Secondary | ICD-10-CM | POA: Diagnosis not present

## 2013-11-18 DIAGNOSIS — Z7982 Long term (current) use of aspirin: Secondary | ICD-10-CM | POA: Diagnosis not present

## 2013-11-18 DIAGNOSIS — I739 Peripheral vascular disease, unspecified: Secondary | ICD-10-CM | POA: Diagnosis present

## 2013-11-18 DIAGNOSIS — I743 Embolism and thrombosis of arteries of the lower extremities: Secondary | ICD-10-CM | POA: Diagnosis not present

## 2013-11-18 DIAGNOSIS — E785 Hyperlipidemia, unspecified: Secondary | ICD-10-CM | POA: Diagnosis present

## 2013-11-18 DIAGNOSIS — I252 Old myocardial infarction: Secondary | ICD-10-CM | POA: Diagnosis not present

## 2013-11-18 DIAGNOSIS — E119 Type 2 diabetes mellitus without complications: Secondary | ICD-10-CM | POA: Diagnosis present

## 2013-11-18 DIAGNOSIS — S98119A Complete traumatic amputation of unspecified great toe, initial encounter: Secondary | ICD-10-CM | POA: Diagnosis not present

## 2013-11-18 DIAGNOSIS — Z9889 Other specified postprocedural states: Secondary | ICD-10-CM | POA: Diagnosis not present

## 2013-11-18 DIAGNOSIS — I498 Other specified cardiac arrhythmias: Secondary | ICD-10-CM | POA: Diagnosis not present

## 2013-11-18 DIAGNOSIS — I10 Essential (primary) hypertension: Secondary | ICD-10-CM | POA: Diagnosis present

## 2013-11-18 DIAGNOSIS — Z48812 Encounter for surgical aftercare following surgery on the circulatory system: Secondary | ICD-10-CM | POA: Diagnosis not present

## 2013-11-18 DIAGNOSIS — I2589 Other forms of chronic ischemic heart disease: Secondary | ICD-10-CM | POA: Diagnosis present

## 2013-11-18 DIAGNOSIS — I4891 Unspecified atrial fibrillation: Secondary | ICD-10-CM | POA: Diagnosis present

## 2013-11-18 DIAGNOSIS — Z87442 Personal history of urinary calculi: Secondary | ICD-10-CM | POA: Diagnosis not present

## 2013-11-18 DIAGNOSIS — G4733 Obstructive sleep apnea (adult) (pediatric): Secondary | ICD-10-CM | POA: Diagnosis present

## 2013-11-18 DIAGNOSIS — K219 Gastro-esophageal reflux disease without esophagitis: Secondary | ICD-10-CM | POA: Diagnosis present

## 2013-11-18 DIAGNOSIS — Z87891 Personal history of nicotine dependence: Secondary | ICD-10-CM | POA: Diagnosis not present

## 2013-11-19 HISTORY — PX: ABDOMINAL AORTIC ANEURYSM REPAIR: SUR1152

## 2013-12-02 ENCOUNTER — Encounter: Payer: Medicare Other | Admitting: Cardiology

## 2013-12-02 ENCOUNTER — Encounter: Payer: Self-pay | Admitting: Cardiology

## 2013-12-02 NOTE — Progress Notes (Signed)
Patient canceled.  This encounter was created in error - please disregard. 

## 2013-12-04 ENCOUNTER — Ambulatory Visit: Payer: Medicare Other | Admitting: Cardiology

## 2013-12-22 DIAGNOSIS — E119 Type 2 diabetes mellitus without complications: Secondary | ICD-10-CM | POA: Diagnosis not present

## 2013-12-22 DIAGNOSIS — E669 Obesity, unspecified: Secondary | ICD-10-CM | POA: Diagnosis not present

## 2013-12-22 DIAGNOSIS — Z23 Encounter for immunization: Secondary | ICD-10-CM | POA: Diagnosis not present

## 2013-12-22 DIAGNOSIS — I10 Essential (primary) hypertension: Secondary | ICD-10-CM | POA: Diagnosis not present

## 2013-12-22 DIAGNOSIS — IMO0001 Reserved for inherently not codable concepts without codable children: Secondary | ICD-10-CM | POA: Diagnosis not present

## 2013-12-25 DIAGNOSIS — Z951 Presence of aortocoronary bypass graft: Secondary | ICD-10-CM | POA: Diagnosis not present

## 2013-12-25 DIAGNOSIS — I70209 Unspecified atherosclerosis of native arteries of extremities, unspecified extremity: Secondary | ICD-10-CM | POA: Diagnosis not present

## 2013-12-25 DIAGNOSIS — M47817 Spondylosis without myelopathy or radiculopathy, lumbosacral region: Secondary | ICD-10-CM | POA: Diagnosis not present

## 2013-12-25 DIAGNOSIS — K5730 Diverticulosis of large intestine without perforation or abscess without bleeding: Secondary | ICD-10-CM | POA: Diagnosis not present

## 2013-12-25 DIAGNOSIS — I716 Thoracoabdominal aortic aneurysm, without rupture, unspecified: Secondary | ICD-10-CM | POA: Diagnosis not present

## 2013-12-25 DIAGNOSIS — N200 Calculus of kidney: Secondary | ICD-10-CM | POA: Diagnosis not present

## 2013-12-25 DIAGNOSIS — K439 Ventral hernia without obstruction or gangrene: Secondary | ICD-10-CM | POA: Diagnosis not present

## 2013-12-25 DIAGNOSIS — J438 Other emphysema: Secondary | ICD-10-CM | POA: Diagnosis not present

## 2013-12-25 DIAGNOSIS — D7389 Other diseases of spleen: Secondary | ICD-10-CM | POA: Diagnosis not present

## 2013-12-25 DIAGNOSIS — N400 Enlarged prostate without lower urinary tract symptoms: Secondary | ICD-10-CM | POA: Diagnosis not present

## 2013-12-25 DIAGNOSIS — I723 Aneurysm of iliac artery: Secondary | ICD-10-CM | POA: Diagnosis not present

## 2013-12-25 DIAGNOSIS — N281 Cyst of kidney, acquired: Secondary | ICD-10-CM | POA: Diagnosis not present

## 2013-12-29 ENCOUNTER — Encounter: Payer: Self-pay | Admitting: Cardiology

## 2013-12-29 ENCOUNTER — Ambulatory Visit (INDEPENDENT_AMBULATORY_CARE_PROVIDER_SITE_OTHER): Payer: Medicare Other | Admitting: Cardiology

## 2013-12-29 VITALS — BP 129/76 | HR 73 | Ht 73.0 in | Wt 238.0 lb

## 2013-12-29 DIAGNOSIS — I2510 Atherosclerotic heart disease of native coronary artery without angina pectoris: Secondary | ICD-10-CM

## 2013-12-29 DIAGNOSIS — I2589 Other forms of chronic ischemic heart disease: Secondary | ICD-10-CM

## 2013-12-29 DIAGNOSIS — I10 Essential (primary) hypertension: Secondary | ICD-10-CM

## 2013-12-29 DIAGNOSIS — I724 Aneurysm of artery of lower extremity: Secondary | ICD-10-CM | POA: Diagnosis not present

## 2013-12-29 DIAGNOSIS — I209 Angina pectoris, unspecified: Secondary | ICD-10-CM | POA: Diagnosis not present

## 2013-12-29 DIAGNOSIS — I25119 Atherosclerotic heart disease of native coronary artery with unspecified angina pectoris: Secondary | ICD-10-CM

## 2013-12-29 DIAGNOSIS — I255 Ischemic cardiomyopathy: Secondary | ICD-10-CM

## 2013-12-29 MED ORDER — ISOSORBIDE MONONITRATE ER 60 MG PO TB24: 120.0000 mg | ORAL_TABLET | Freq: Every morning | ORAL | Status: DC

## 2013-12-29 NOTE — Assessment & Plan Note (Signed)
Continue medical regimen, add Imdur 30 mg dose to the evening as well. Continue observation. Encouraged diet and exercise regimen.

## 2013-12-29 NOTE — Patient Instructions (Signed)
Your physician recommends that you schedule a follow-up appointment in: 6 months. You will receive a reminder letter in the mail in about 4 months reminding you to call and schedule your appointment. If you don't receive this letter, please contact our office. Your physician has recommended you make the following change in your medication:  Continue taking your isosorbide mononitrate 120 mg in the morning. Add taking 30 mg in the evening. Please break your 60 mg tablet in half to equal this dose. Continue all other medications the same.

## 2013-12-29 NOTE — Assessment & Plan Note (Signed)
Keep followup with Dr. Donnetta Hutching.

## 2013-12-29 NOTE — Assessment & Plan Note (Signed)
LVEF approximately 40%, scar with minor peri-infarct ischemia by Cardiolite in April.

## 2013-12-29 NOTE — Assessment & Plan Note (Signed)
Continue home blood pressure checks.

## 2013-12-29 NOTE — Progress Notes (Signed)
Clinical Summary [name] is a medically complex 68 y.o.male last seen in April. He ultimately underwent abdominal aortic aneurysm surgery at [facility] back in July, no cardiac complications. He still follows with Dr. Donnetta [name] as well.  Through diet he has lost a significant amount of weight, approximately 40 pounds. He plans to get a stationary bicycle for aerobic exercise, limited otherwise no claudication symptoms.  Followup echocardiogram done in March showed moderate LVH with stable LVEF 45-50%, difficult to assess focal wall motion, aortic valve sclerosis without stenosis, mild left atrial enlargement.  Followup Lexiscan Cardiolite in April of this year showed no diagnostic ST segment changes, inferior wall scar with minor peri-infarct ischemia in the mid lateral wall, LVEF 39%. We continue medical therapy and observation.  Recent lab work in June showed potassium 4.0, BUN 36, creatinine 1.5, hemoglobin 12.6, platelets 414. Recent ECG in June showed sinus rhythm with evidence of previous inferior wall infarct, nonspecific ST changes, decreased R wave progression.  He has had some nocturnal angina symptoms, was previously on an evening dose of Imdur until this was reduced related to decreasing blood pressures. Coreg was also cut back related to low blood pressure. His weight loss is probably contributed to better blood pressure in general.  Allergies  Allergen Reactions  . Ace Inhibitors Anaphylaxis  . Lisinopril Anaphylaxis and Swelling  . Codeine     REACTION: delirium  . Hydromorphone     Other reaction(s): NAUSEA    Current Outpatient Prescriptions  Medication Sig Dispense Refill  . allopurinol (ZYLOPRIM) 100 MG tablet Take 100 mg by mouth daily.      Marland Kitchen aspirin EC 81 MG tablet Take 81 mg by mouth daily.      . carvedilol (COREG) 25 MG tablet Take 12.5 mg by mouth 2 (two) times daily with a meal.       . clopidogrel (PLAVIX) 75 MG tablet Take 75 mg by mouth daily.       . furosemide (LASIX) 40 MG tablet Take 40 mg by mouth 2 (two) times daily.       Marland Kitchen gabapentin (NEURONTIN) 300 MG capsule Take 300 mg by mouth 2 (two) times daily.      Marland Kitchen glipiZIDE (GLUCOTROL) 10 MG tablet Take 10 mg by mouth 2 (two) times daily before a meal.       . isosorbide mononitrate (IMDUR) 60 MG 24 hr tablet Take 2 tablets (120 mg total) by mouth every morning. & 30 mg in the evening  90 tablet  3  . linagliptin (TRADJENTA) 5 MG TABS tablet Take 5 mg by mouth daily.        . metFORMIN (GLUCOPHAGE) 1000 MG tablet Take 1,000 mg by mouth 2 (two) times daily with a meal.        . nitroGLYCERIN (NITROSTAT) 0.4 MG SL tablet Place 0.4 mg under the tongue every 5 (five) minutes as needed for chest pain.      . simvastatin (ZOCOR) 40 MG tablet Take 40 mg by mouth at bedtime.       No current facility-administered medications for this visit.    Past Medical History  Diagnosis Date  . Ischemic cardiomyopathy     LVEF 45-50% 11/2011  . Coronary atherosclerosis of native coronary artery     Multivessel status post CABG  . Angioedema     Secondary to ACE inhibitor  . Bell palsy   . Type 2 diabetes mellitus   . AAA (abdominal aortic aneurysm)  Followed by Dr. Donnetta [name]  . Essential hypertension, benign   . PAD (peripheral artery disease)     Followed by Dr. Donnetta [name]  . Sleep apnea   . Arthritis   . Carotid artery disease   . Atrial fibrillation   . Myocardial infarction 2001  . Nephrolithiasis     Past Surgical History  Procedure Laterality Date  . Coronary artery bypass graft      LIMA to LAD, SVG to diagonal and ramus, SVG to OM, SVG to AM  . Hand surgery  2009  . Cataract extraction w/phaco  03/23/2011    Procedure: CATARACT EXTRACTION PHACO AND INTRAOCULAR LENS PLACEMENT (IOC);  Surgeon: Tonny Branch;  Location: [facility];  Service: Ophthalmology;  Laterality: Left;  CDE: 15.99  . Cataract extraction w/phaco  04/17/2011    Procedure: CATARACT EXTRACTION PHACO AND INTRAOCULAR LENS  PLACEMENT (IOC);  Surgeon: Tonny Branch;  Location: [facility];  Service: Ophthalmology;  Laterality: Right;  CDE: 23.45  . Foot surgery Left 1963    Social History [name] reports that he quit smoking about 6 years ago. His smoking use included Cigarettes. He has a 25 pack-year smoking history. He has never used smokeless tobacco. [name] reports that he does not drink alcohol.  Review of Systems NYHA class II dyspnea, no palpitations or syncope. Other systems reviewed and negative except as outlined.  Physical Examination Filed Vitals:   12/29/13 1312  BP: 129/76  Pulse: 73   Filed Weights   12/29/13 1312  Weight: 238 lb (107.956 kg)    Obese male, comfortable at rest.  HEENT: Conjunctiva and lids normal, oropharynx clear.  Neck: Supple, no elevated JVP or carotid bruits, no thyromegaly.  Lungs: Clear to auscultation, nonlabored breathing at rest.  Cardiac: Regular rate and rhythm, S4, no significant systolic murmur, no pericardial rub.  Abdomen: Soft, nontender, protuberant, bowel sounds present.  Extremities: No pitting edema, palpable popliteal on left, absent on right.  Skin: Warm and dry.  Musculoskeletal: No kyphosis.  Neuropsychiatric: Alert and oriented x3, affect grossly appropriate.   Problem List and Plan   Coronary atherosclerosis of native coronary artery Continue medical regimen, add Imdur 30 mg dose to the evening as well. Continue observation. Encouraged diet and exercise regimen.  Ischemic cardiomyopathy LVEF approximately 40%, scar with minor peri-infarct ischemia by Cardiolite in April.  Essential hypertension, benign Continue home blood pressure checks.  Aneurysm of artery of lower extremity Keep followup with Dr. Donnetta [name].    Satira Sark, M.D., F.A.C.C.

## 2014-02-27 ENCOUNTER — Other Ambulatory Visit: Payer: Self-pay | Admitting: *Deleted

## 2014-02-27 MED ORDER — SIMVASTATIN 40 MG PO TABS: 40.0000 mg | ORAL_TABLET | Freq: Every day | ORAL | Status: DC

## 2014-03-05 DIAGNOSIS — Z48812 Encounter for surgical aftercare following surgery on the circulatory system: Secondary | ICD-10-CM | POA: Diagnosis not present

## 2014-03-05 DIAGNOSIS — I716 Thoracoabdominal aortic aneurysm, without rupture: Secondary | ICD-10-CM | POA: Diagnosis not present

## 2014-03-05 DIAGNOSIS — Z09 Encounter for follow-up examination after completed treatment for conditions other than malignant neoplasm: Secondary | ICD-10-CM | POA: Diagnosis not present

## 2014-03-05 DIAGNOSIS — N200 Calculus of kidney: Secondary | ICD-10-CM | POA: Diagnosis not present

## 2014-03-30 ENCOUNTER — Encounter: Payer: Self-pay | Admitting: Family

## 2014-03-31 ENCOUNTER — Ambulatory Visit: Payer: Medicare Other | Admitting: Family

## 2014-03-31 ENCOUNTER — Encounter (HOSPITAL_COMMUNITY): Payer: Medicare Other

## 2014-03-31 ENCOUNTER — Other Ambulatory Visit (HOSPITAL_COMMUNITY): Payer: Medicare Other

## 2014-04-03 ENCOUNTER — Encounter: Payer: Self-pay | Admitting: Family

## 2014-04-06 ENCOUNTER — Other Ambulatory Visit (HOSPITAL_COMMUNITY): Payer: Medicare Other

## 2014-04-06 ENCOUNTER — Encounter (HOSPITAL_COMMUNITY): Payer: Medicare Other

## 2014-04-06 ENCOUNTER — Ambulatory Visit: Payer: Medicare Other | Admitting: Family

## 2014-04-27 ENCOUNTER — Encounter: Payer: Self-pay | Admitting: Family

## 2014-04-27 ENCOUNTER — Ambulatory Visit (INDEPENDENT_AMBULATORY_CARE_PROVIDER_SITE_OTHER): Payer: Medicare Other | Admitting: Family

## 2014-04-27 ENCOUNTER — Ambulatory Visit (HOSPITAL_COMMUNITY)
Admission: RE | Admit: 2014-04-27 | Discharge: 2014-04-27 | Disposition: A | Discharge status: Home / Self Care | Payer: Medicare Other | Source: Ambulatory Visit | Attending: Family | Admitting: Family

## 2014-04-27 ENCOUNTER — Ambulatory Visit (INDEPENDENT_AMBULATORY_CARE_PROVIDER_SITE_OTHER)
Admission: RE | Admit: 2014-04-27 | Discharge: 2014-04-27 | Disposition: A | Discharge status: Home / Self Care | Payer: Medicare Other | Source: Ambulatory Visit | Attending: Family | Admitting: Family

## 2014-04-27 VITALS — BP 131/85 | HR 81 | Resp 16 | Ht 72.0 in | Wt 228.0 lb

## 2014-04-27 DIAGNOSIS — I724 Aneurysm of artery of lower extremity: Secondary | ICD-10-CM | POA: Insufficient documentation

## 2014-04-27 DIAGNOSIS — I714 Abdominal aortic aneurysm, without rupture, unspecified: Secondary | ICD-10-CM

## 2014-04-27 DIAGNOSIS — Z48812 Encounter for surgical aftercare following surgery on the circulatory system: Secondary | ICD-10-CM | POA: Diagnosis not present

## 2014-04-27 DIAGNOSIS — K439 Ventral hernia without obstruction or gangrene: Secondary | ICD-10-CM | POA: Diagnosis not present

## 2014-04-27 DIAGNOSIS — I2510 Atherosclerotic heart disease of native coronary artery without angina pectoris: Secondary | ICD-10-CM

## 2014-04-27 DIAGNOSIS — I739 Peripheral vascular disease, unspecified: Secondary | ICD-10-CM | POA: Diagnosis not present

## 2014-04-27 NOTE — Patient Instructions (Signed)

## 2014-04-27 NOTE — Progress Notes (Signed)
Established Popliteal Aneurysm  History of Present Illness  [name] is a 68 y.o. (1945/11/14) male patient of Dr. Donnetta Hutching who has a known infrarenal abdominal aortic aneurysm; this is followed by Dr. Vinnie Level in [place], pt saw Dr. Sammuel Hines in January, 2015., and states he was told the AAA was 5.8 cm according to a CT. He returns today for evaluation of his left popliteal aneurysm. He has had no new episodes of congestive heart failure since his last visit.  He does have a prior CT scan showing that his aneurysm begins at the level renal arteries. He does have a known history of right superficial artery occlusion and moderate size left distal superficial femoral artery and popliteal aneurysm.  He was told by [facility] that he has a blockage in his right leg, has pain in his right calf after walking just a few minutes, relieved by rest, no tissue loss; was told that since he had arteries removed from both his legs for his CABG, that revascularization surgery should not be attempted.  He has bilateral buttocks pain with walking since about 2014, is worsening, relieved with rest. Left great toe is missing since trauma long ago to left foot.  He has an intermittent sharp pain at anterior thigh, mostly when he is sitting, is non radiating;  he does have pain in left knee when he tries to get out of a chair, this has not changed. He denies any know back problems.  He thinks his DM is under better control, states his A1C has decreased to 6.2 at the last check, improved from 7.6 at his last visit. He lost 37 pounds since his last visit in May 2015, states this is with effort.  He had a AAA endovascular repair July 2015 at [facility], states he has low energy since this.  He started having tremors in both hands in 2014, seems to be worsening, pt states he told his PCP, pt states he wants this checked, states he has not been referred to a specialist. Pt states he is also  concerned about his umbilical hernia and would like this evaluated.  He states he was told that he had a stroke at age 47 or 55, none since then. He quit smoking in 2009. He takes a daily statin, daily ASA, no anticoagulants. He was on Plavix for a few months after the AAA repair, this has stopped.  .  Past Medical History  Diagnosis Date  . Ischemic cardiomyopathy     LVEF 45-50% 11/2011  . Coronary atherosclerosis of native coronary artery     Multivessel status post CABG  . Angioedema     Secondary to ACE inhibitor  . Bell palsy   . Type 2 diabetes mellitus   . AAA (abdominal aortic aneurysm)     Followed by Dr. Donnetta Hutching  . Essential hypertension, benign   . PAD (peripheral artery disease)     Followed by Dr. Donnetta Hutching  . Sleep apnea   . Arthritis   . Carotid artery disease   . Atrial fibrillation   . Myocardial infarction 2001  . Nephrolithiasis     Social History History  Substance Use Topics  . Smoking status: Former Smoker -- 1.00 packs/day for 25 years    Types: Cigarettes    Quit date: 05/23/2007  . Smokeless tobacco: Never Used  . Alcohol Use: No    Family History Family History  Problem Relation Age of Onset  . Diabetes Mother  Type  I   . Varicose Veins Mother   . Heart disease Father 61    AAA  . AAA (abdominal aortic aneurysm) Father   . Hyperlipidemia Father   . Hypertension Father     Surgical History Past Surgical History  Procedure Laterality Date  . Coronary artery bypass graft      LIMA to LAD, SVG to diagonal and ramus, SVG to OM, SVG to AM  . Hand surgery  2009  . Cataract extraction w/phaco  03/23/2011    Procedure: CATARACT EXTRACTION PHACO AND INTRAOCULAR LENS PLACEMENT (IOC);  Surgeon: Tonny Branch;  Location: [facility];  Service: Ophthalmology;  Laterality: Left;  CDE: 15.99  . Cataract extraction w/phaco  04/17/2011    Procedure: CATARACT EXTRACTION PHACO AND INTRAOCULAR LENS PLACEMENT (IOC);  Surgeon: Tonny Branch;  Location: [facility];   Service: Ophthalmology;  Laterality: Right;  CDE: 23.45  . Foot surgery Left 1963  . Eye surgery    . Abdominal aortic aneurysm repair  November 19, 2013    [facility] :  Dr. Sammuel Hines    Allergies  Allergen Reactions  . Ace Inhibitors Anaphylaxis  . Codeine Other (See Comments)    REACTION: delirium  . Lisinopril Anaphylaxis and Swelling  . Hydromorphone Nausea And Vomiting    Other reaction(s): NAUSEA    Current Outpatient Prescriptions  Medication Sig Dispense Refill  . acetaminophen (TYLENOL) 325 MG tablet Take 325 mg by mouth as needed.    Marland Kitchen allopurinol (ZYLOPRIM) 100 MG tablet Take 100 mg by mouth daily.    Marland Kitchen aspirin EC 81 MG tablet Take 81 mg by mouth daily.    . carvedilol (COREG) 25 MG tablet Take 12.5 mg by mouth 2 (two) times daily with a meal.     . furosemide (LASIX) 40 MG tablet Take 40 mg by mouth 2 (two) times daily.     Marland Kitchen gabapentin (NEURONTIN) 300 MG capsule Take 300 mg by mouth 2 (two) times daily.    Marland Kitchen glipiZIDE (GLUCOTROL) 10 MG tablet Take 10 mg by mouth 2 (two) times daily before a meal.     . isosorbide mononitrate (IMDUR) 60 MG 24 hr tablet Take 2 tablets (120 mg total) by mouth every morning. & 30 mg in the evening 90 tablet 3  . linagliptin (TRADJENTA) 5 MG TABS tablet Take 5 mg by mouth daily.      . metFORMIN (GLUCOPHAGE) 1000 MG tablet Take 1,000 mg by mouth 2 (two) times daily with a meal.      . nitroGLYCERIN (NITROSTAT) 0.4 MG SL tablet Place 0.4 mg under the tongue every 5 (five) minutes as needed for chest pain.    . simvastatin (ZOCOR) 40 MG tablet Take 1 tablet (40 mg total) by mouth at bedtime. 30 tablet 6  . clopidogrel (PLAVIX) 75 MG tablet Take 75 mg by mouth daily.     No current facility-administered medications for this visit.    REVIEW OF SYSTEMS: see HPI for pertinent positives and negatives   Physical Examination  Filed Vitals:   04/27/14 1552  BP: 131/85  Pulse: 81  Resp: 16  Height: 6' (1.829 m)  Weight: 228 lb (103.42 kg)    SpO2: 99%   Body mass index is 30.92 kg/(m^2).   General: The patient appears their stated age, obese male.  HEENT: No gross abnormalities Pulmonary: Respirations are non-labored Abdomen: Soft and non-tender, large reducible ventral hernia. Musculoskeletal: There are no major deformities.  Neurologic: No focal weakness  or paresthesias are detected, mild resting tremor in both hands. Skin: There are no ulcer or rashes noted. Psychiatric: The patient has normal affect. Cardiovascular: There is a regular rate and rhythm without significant murmur appreciated.   Vessel Right Left  Radial 2+Palpable 2+Palpable  Carotid without bruit without bruit  Aorta Not palpable N/A  Femoral Palpable Palpable  Popliteal Not palpable Not palpable  PT Palpable Not Palpable  DP Palpable Palpable    Non-Invasive Vascular Imaging  Popliteal Arterial Duplex (Date: 04/27/2014) LOWER EXTREMITY ARTERIAL DUPLEX EVALUATION      INDICATION: Left superficial femoral artery aneurysm    PREVIOUS INTERVENTION(S):     DUPLEX EXAM:     Popliteal Artery Diameter   AP (cm) Transverse (cm)  Right    Left 1.2 (Distal SFA=3.1) 1.1 (Distal SFA=3.0)  ADDITIONAL FINDINGS: . Biphasic Doppler waveforms noted in the left distal superficial femoral and popliteal arteries with no hemodynamically significant stenosis. . Partially-occlusive, mural thrombus noted in the left distal superficial femoral artery.      Medical Decision Making  [name] is a 68 y.o. male who presents with: left popliteal aneurysm that is asymptomatic with no evidence of critical limb ischemia. Based on the patient's vascular studies and examination, and after discussing with Dr. Trula Slade, I have offered the patient: CTA with bilateral run off to evaluate iliac disease given new onset and worsening bilateral buttocks claudication and evaluate popliteal aneurysm for possible approach for  repair.  Referral to general surgeon for evaluation and management of large ventral hernia. Patient advised to again discuss his worsening hand tremors and generalized weakness with his PCP.   I discussed in depth with the patient the nature of atherosclerosis, and emphasized the importance of maximal medical management including strict control of blood pressure, blood glucose, and lipid levels, antiplatelet agents, obtaining regular exercise, and cessation of smoking.   The patient is aware that without maximal medical management the underlying atherosclerotic disease process will progress, limiting the benefit of any interventions.  The patient is currently on a statin.   The patient is currently on an anti-platelet: ASA.  Thank you for allowing [place] to participate in this patient's care.   NICKEL, Sharmon Leyden, RN, MSN, FNP-C [facility] Office: [phone]  04/27/2014, 4:01 PM  Clinic MD: Trula Slade

## 2014-05-01 DIAGNOSIS — Z961 Presence of intraocular lens: Secondary | ICD-10-CM | POA: Diagnosis not present

## 2014-05-01 DIAGNOSIS — H59092 Other disorders of the left eye following cataract surgery: Secondary | ICD-10-CM | POA: Diagnosis not present

## 2014-05-08 ENCOUNTER — Other Ambulatory Visit: Payer: Self-pay | Admitting: Vascular Surgery

## 2014-05-08 DIAGNOSIS — Z01812 Encounter for preprocedural laboratory examination: Secondary | ICD-10-CM | POA: Diagnosis not present

## 2014-05-08 LAB — BUN: BUN: 20 mg/dL (ref 6–23)

## 2014-05-08 LAB — CREATININE, SERUM: CREATININE: 1.52 mg/dL — AB (ref 0.50–1.35)

## 2014-05-11 ENCOUNTER — Encounter: Payer: Self-pay | Admitting: Vascular Surgery

## 2014-05-12 ENCOUNTER — Ambulatory Visit
Admission: RE | Admit: 2014-05-12 | Discharge: 2014-05-12 | Disposition: A | Discharge status: Home / Self Care | Payer: Medicare Other | Source: Ambulatory Visit | Attending: Family | Admitting: Family

## 2014-05-12 ENCOUNTER — Ambulatory Visit (INDEPENDENT_AMBULATORY_CARE_PROVIDER_SITE_OTHER): Payer: Medicare Other | Admitting: Vascular Surgery

## 2014-05-12 VITALS — BP 158/83 | HR 71 | Resp 16 | Ht 72.0 in | Wt 231.0 lb

## 2014-05-12 DIAGNOSIS — I2510 Atherosclerotic heart disease of native coronary artery without angina pectoris: Secondary | ICD-10-CM

## 2014-05-12 DIAGNOSIS — I724 Aneurysm of artery of lower extremity: Secondary | ICD-10-CM | POA: Diagnosis not present

## 2014-05-12 DIAGNOSIS — I743 Embolism and thrombosis of arteries of the lower extremities: Secondary | ICD-10-CM | POA: Diagnosis not present

## 2014-05-12 DIAGNOSIS — N200 Calculus of kidney: Secondary | ICD-10-CM | POA: Diagnosis not present

## 2014-05-12 DIAGNOSIS — I714 Abdominal aortic aneurysm, without rupture: Secondary | ICD-10-CM | POA: Diagnosis not present

## 2014-05-12 MED ORDER — IOHEXOL 350 MG/ML SOLN
120.0000 mL | Freq: Once | INTRAVENOUS | Status: AC | PRN
  Administered 2014-05-12: 120 mL via INTRAVENOUS

## 2014-05-12 NOTE — Progress Notes (Signed)
Patient presents today for kidney follow-up of his left popliteal artery aneurysm. We have followed this over the course of several years. Most recently his Doppler duplex in May 2015 revealed a 2.8 cm distal superficial femoral artery aneurysm. Today he went for a CT angiogram for further evaluation. This was of his abdomen pelvis and runoff. He did have a known juxtarenal aneurysm which were treated with stent grafting at [facility] in July of this year. He has followed up with Dr. Sammuel Hines since then.  He reports a bilateral hip pain which appears to be more arthritic or potentially degenerative disc disease then claudication. He does have right calf claudication with known occlusion of his superficial artery which is chronic.  Past Medical History  Diagnosis Date  . Ischemic cardiomyopathy     LVEF 45-50% 11/2011  . Coronary atherosclerosis of native coronary artery     Multivessel status post CABG  . Angioedema     Secondary to ACE inhibitor  . Bell palsy   . Type 2 diabetes mellitus   . AAA (abdominal aortic aneurysm)     Followed by Dr. Donnetta Hutching  . Essential hypertension, benign   . PAD (peripheral artery disease)     Followed by Dr. Donnetta Hutching  . Sleep apnea   . Arthritis   . Carotid artery disease   . Atrial fibrillation   . Myocardial infarction 2001  . Nephrolithiasis     History  Substance Use Topics  . Smoking status: Former Smoker -- 1.00 packs/day for 25 years    Types: Cigarettes    Quit date: 05/23/2007  . Smokeless tobacco: Never Used  . Alcohol Use: No    Family History  Problem Relation Age of Onset  . Diabetes Mother     Type  I   . Varicose Veins Mother   . Heart disease Father 73    AAA  . AAA (abdominal aortic aneurysm) Father   . Hyperlipidemia Father   . Hypertension Father     Allergies  Allergen Reactions  . Ace Inhibitors Anaphylaxis  . Codeine Other (See Comments)    REACTION: delirium  . Lisinopril Anaphylaxis and Swelling  .  Hydromorphone Nausea And Vomiting    Other reaction(s): NAUSEA    Current outpatient prescriptions: acetaminophen (TYLENOL) 325 MG tablet, Take 325 mg by mouth as needed., Disp: , Rfl: ;  allopurinol (ZYLOPRIM) 100 MG tablet, Take 100 mg by mouth daily., Disp: , Rfl: ;  aspirin EC 81 MG tablet, Take 81 mg by mouth daily., Disp: , Rfl: ;  carvedilol (COREG) 25 MG tablet, Take 12.5 mg by mouth 2 (two) times daily with a meal. , Disp: , Rfl:  clopidogrel (PLAVIX) 75 MG tablet, Take 75 mg by mouth daily., Disp: , Rfl: ;  furosemide (LASIX) 40 MG tablet, Take 40 mg by mouth 2 (two) times daily. , Disp: , Rfl: ;  gabapentin (NEURONTIN) 300 MG capsule, Take 300 mg by mouth 2 (two) times daily., Disp: , Rfl: ;  glipiZIDE (GLUCOTROL) 10 MG tablet, Take 10 mg by mouth 2 (two) times daily before a meal. , Disp: , Rfl:  isosorbide mononitrate (IMDUR) 60 MG 24 hr tablet, Take 2 tablets (120 mg total) by mouth every morning. & 30 mg in the evening, Disp: 90 tablet, Rfl: 3;  linagliptin (TRADJENTA) 5 MG TABS tablet, Take 5 mg by mouth daily.  , Disp: , Rfl: ;  nitroGLYCERIN (NITROSTAT) 0.4 MG SL tablet, Place 0.4 mg under the  tongue every 5 (five) minutes as needed for chest pain., Disp: , Rfl:  simvastatin (ZOCOR) 40 MG tablet, Take 1 tablet (40 mg total) by mouth at bedtime., Disp: 30 tablet, Rfl: 6;  metFORMIN (GLUCOPHAGE) 1000 MG tablet, Take 1,000 mg by mouth 2 (two) times daily with a meal.  , Disp: , Rfl:   BP 158/83 mmHg  Pulse 71  Resp 16  Ht 6' (1.829 m)  Wt 231 lb (104.781 kg)  BMI 31.32 kg/m2  Body mass index is 31.32 kg/(m^2).       Physical exam he does have obesity. I cannot palpate an abdominal aortic aneurysm. He does have a large umbilical hernia. Reducible. He does have a 2+ femoral pulses bilaterally. He has a very prominent popliteal pulse in the left and a palpable dorsalis pedis pulse in the left. He has absent popliteal and distal pulses in the right. Does have a prior vein harvest  from his ankle to his knee bilaterally. I imaged the saphenous vein above this on the left he does have a patent saphenous vein from his knee proximally.  I reviewed his CT scan discussed this with the patient. At this does show a type I distal endoleak from his prior stent graft in the right iliac artery. He reports that this is known and is being followed in [facility]. He does have a very large popliteal artery aneurysm on the left which is 3.1 cm in diameter. His right SFA is occluded.  Pression and plan large 3.1 cm Pop to artery aneurysm on the left which is continue to enlarge by serial duplex follow-up. I have recommended repair. He will see his cardiologist in [facility] for a preoperative clearance. He is to follow-up with Dr. Sammuel Hines in [facility] and will obtain a copy of his CT to take this with him. We will schedule left popliteal artery aneurysm repair following cardiac clearance

## 2014-05-20 ENCOUNTER — Other Ambulatory Visit: Payer: Self-pay

## 2014-05-26 ENCOUNTER — Ambulatory Visit (INDEPENDENT_AMBULATORY_CARE_PROVIDER_SITE_OTHER): Payer: Medicare Other | Admitting: Cardiology

## 2014-05-26 ENCOUNTER — Encounter: Payer: Self-pay | Admitting: Cardiology

## 2014-05-26 VITALS — BP 129/56 | HR 79 | Ht 72.0 in | Wt 228.0 lb

## 2014-05-26 DIAGNOSIS — Z01810 Encounter for preprocedural cardiovascular examination: Secondary | ICD-10-CM | POA: Diagnosis not present

## 2014-05-26 DIAGNOSIS — Z01818 Encounter for other preprocedural examination: Secondary | ICD-10-CM | POA: Diagnosis not present

## 2014-05-26 DIAGNOSIS — I2510 Atherosclerotic heart disease of native coronary artery without angina pectoris: Secondary | ICD-10-CM | POA: Diagnosis not present

## 2014-05-26 DIAGNOSIS — I10 Essential (primary) hypertension: Secondary | ICD-10-CM | POA: Diagnosis not present

## 2014-05-26 NOTE — Assessment & Plan Note (Signed)
Multivessel disease status post CABG as outlined above.

## 2014-05-26 NOTE — Assessment & Plan Note (Signed)
Patient to undergo elective surgical repair under general anesthesia of enlarging 3.1 cm popliteal artery aneurysm on the left with Dr. Donnetta Hutching. He has been stable from a cardiac perspective without progressive angina symptoms on medical therapy including aspirin, Coreg, Imdur, and Zocor. Ischemic workup from within the last year showed inferior wall scar with minor peri-infarct ischemia in the mid lateral wall. He has done well on medical therapy and LVEF was approximately 45-50% by echocardiogram. He should be able to proceed with planned surgery at a low to intermediate perioperative cardiac risk. No further cardiac testing is planned at this time.

## 2014-05-26 NOTE — Patient Instructions (Signed)
Your physician wants you to follow-up in: 6 months with Dr. McDowell You will receive a reminder letter in the mail two months in advance. If you don't receive a letter, please call our office to schedule the follow-up appointment.  Your physician recommends that you continue on your current medications as directed. Please refer to the Current Medication list given to you today.  Thank you for choosing [facility]!!    

## 2014-05-26 NOTE — Progress Notes (Signed)
Reason for visit: Preoperative evaluation  Clinical Summary Mr. Membreno is a medically complex 69 y.o.male last seen in October 2015. He is referred to the office by Dr. Donnetta Hutching for preoperative evaluation. He has a 3.1 cm popliteal artery aneurysm on the left that has shown evidence of continued enlargement on follow-up examinations. He is being considered for surgical repair under general anesthesia.  Cardiac history is outlined below. He has been clinically stable without progressive angina symptoms on medical therapy. ECG today shows sinus rhythm with nonspecific T-wave changes.  He continues on aspirin, Coreg, Imdur, and Zocor.  Echocardiogram in March 2015 showed moderate LVH with stable LVEF 45-50%, difficult to assess focal wall motion, aortic valve sclerosis without stenosis, mild left atrial enlargement. Lexiscan Cardiolite in April 2015 showed no diagnostic ST segment changes, inferior wall scar with minor peri-infarct ischemia in the mid lateral wall, LVEF 39%.  We have been managing him medically.  He underwent stent graft repair of AAA in [facility] this past July without cardiac complication.   Allergies  Allergen Reactions  . Ace Inhibitors Anaphylaxis  . Codeine Other (See Comments)    REACTION: delirium  . Lisinopril Anaphylaxis and Swelling  . Hydromorphone Nausea And Vomiting    Other reaction(s): NAUSEA    Current Outpatient Prescriptions  Medication Sig Dispense Refill  . acetaminophen (TYLENOL) 325 MG tablet Take 325 mg by mouth as needed.    Marland Kitchen allopurinol (ZYLOPRIM) 100 MG tablet Take 100 mg by mouth daily.    Marland Kitchen aspirin EC 81 MG tablet Take 81 mg by mouth daily.    . carvedilol (COREG) 25 MG tablet Take 12.5 mg by mouth 2 (two) times daily with a meal.     . furosemide (LASIX) 40 MG tablet Take 40 mg by mouth 2 (two) times daily.     Marland Kitchen gabapentin (NEURONTIN) 300 MG capsule Take 300 mg by mouth 2 (two) times daily.    Marland Kitchen glipiZIDE (GLUCOTROL) 10 MG tablet Take  10 mg by mouth 2 (two) times daily before a meal.     . isosorbide mononitrate (IMDUR) 60 MG 24 hr tablet Take 2 tablets (120 mg total) by mouth every morning. & 30 mg in the evening 90 tablet 3  . linagliptin (TRADJENTA) 5 MG TABS tablet Take 5 mg by mouth daily.      . metFORMIN (GLUCOPHAGE) 1000 MG tablet Take 1,000 mg by mouth 2 (two) times daily with a meal.      . nitroGLYCERIN (NITROSTAT) 0.4 MG SL tablet Place 0.4 mg under the tongue every 5 (five) minutes as needed for chest pain.    . simvastatin (ZOCOR) 40 MG tablet Take 1 tablet (40 mg total) by mouth at bedtime. 30 tablet 6   No current facility-administered medications for this visit.    Past Medical History  Diagnosis Date  . Ischemic cardiomyopathy     LVEF 45-50% 11/2011  . Coronary atherosclerosis of native coronary artery     Multivessel status post CABG  . Angioedema     Secondary to ACE inhibitor  . Bell palsy   . Type 2 diabetes mellitus   . AAA (abdominal aortic aneurysm)     Followed by Dr. Donnetta Hutching  . Essential hypertension, benign   . PAD (peripheral artery disease)     Followed by Dr. Donnetta Hutching  . Sleep apnea   . Arthritis   . Carotid artery disease   . Atrial fibrillation   . Myocardial infarction 2001  .  Nephrolithiasis     Past Surgical History  Procedure Laterality Date  . Coronary artery bypass graft      LIMA to LAD, SVG to diagonal and ramus, SVG to OM, SVG to AM  . Hand surgery  2009  . Cataract extraction w/phaco  03/23/2011    Procedure: CATARACT EXTRACTION PHACO AND INTRAOCULAR LENS PLACEMENT (IOC);  Surgeon: Tonny Branch;  Location: [facility];  Service: Ophthalmology;  Laterality: Left;  CDE: 15.99  . Cataract extraction w/phaco  04/17/2011    Procedure: CATARACT EXTRACTION PHACO AND INTRAOCULAR LENS PLACEMENT (IOC);  Surgeon: Tonny Branch;  Location: [facility];  Service: Ophthalmology;  Laterality: Right;  CDE: 23.45  . Foot surgery Left 1963  . Eye surgery    . Abdominal aortic aneurysm repair  November 19, 2013    [facility] :  Dr. Sammuel Hines    Family History  Problem Relation Age of Onset  . Diabetes Mother     Type  I   . Varicose Veins Mother   . Heart disease Father 48    AAA  . AAA (abdominal aortic aneurysm) Father   . Hyperlipidemia Father   . Hypertension Father     Social History Mr. Amado reports that he quit smoking about 7 years ago. His smoking use included Cigarettes. He started smoking about 35 years ago. He has a 25 pack-year smoking history. He has never used smokeless tobacco. Mr. Caulk reports that he does not drink alcohol.  Review of Systems Complete review of systems negative except as otherwise outlined in the clinical summary and also the following.  No palpitations or dizziness.  NYHA class II dyspnea. Recent  URI.  No fevers or chills.  Physical Examination Filed Vitals:   05/26/14 1536  BP: 129/56  Pulse: 79   Filed Weights   05/26/14 1536  Weight: 228 lb (103.42 kg)    Obese male, comfortable at rest.  HEENT: Conjunctiva and lids normal, oropharynx clear.  Neck: Supple, no elevated JVP or carotid bruits, no thyromegaly.  Lungs: Clear to auscultation, nonlabored breathing at rest.  Cardiac: Regular rate and rhythm, S4, no significant systolic murmur, no pericardial rub.  Abdomen: Soft, nontender, protuberant, bowel sounds present.  Extremities: No pitting edema. Skin: Warm and dry.  Musculoskeletal: No kyphosis.  Neuropsychiatric: Alert and oriented x3, affect grossly appropriate.   Problem List and Plan   Preoperative cardiovascular examination Patient to undergo elective surgical repair under general anesthesia of enlarging 3.1 cm popliteal artery aneurysm on the left with Dr. Donnetta Hutching. He has been stable from a cardiac perspective without progressive angina symptoms on medical therapy including aspirin, Coreg, Imdur, and Zocor. Ischemic workup from within the last year showed inferior wall scar with minor peri-infarct  ischemia in the mid lateral wall. He has done well on medical therapy and LVEF was approximately 45-50% by echocardiogram. He should be able to proceed with planned surgery at a low to intermediate perioperative cardiac risk. No further cardiac testing is planned at this time.  Coronary atherosclerosis of native coronary artery Multivessel disease status post CABG as outlined above.  Essential hypertension, benign Reasonable blood pressure control today. No changes were made.    Satira Sark, M.D., F.A.C.C.

## 2014-05-26 NOTE — Assessment & Plan Note (Signed)
Reasonable blood pressure control today. No changes were made.

## 2014-06-02 NOTE — Pre-Procedure Instructions (Signed)
[name]  06/02/2014   Your procedure is scheduled on:  Monday, January 18th  Report to [facility] Admitting at 530 AM.  Call this number if you have problems the morning of surgery: [phone]   Remember:   Do not eat food or drink liquids after midnight.   Take these medicines the morning of surgery with A SIP OF WATER: coreg, neurontin, imdur, tylenol if needed   Do not wear jewelry  Do not wear lotions, powders, or perfumes,deodorant.  Do not shave 48 hours prior to surgery. Men may shave face and neck.  Do not bring valuables to the hospital.  [facility] is not responsible  for any belongings or valuables.               Contacts, dentures or bridgework may not be worn into surgery.  Leave suitcase in the car. After surgery it may be brought to your room.  For patients admitted to the hospital, discharge time is determined by your treatment team.        Please read over the following fact sheets that you were given: Pain Booklet, Coughing and Deep Breathing, Blood Transfusion Information, MRSA Information and Surgical Site Infection Prevention  [facility] - Preparing for Surgery  Before surgery, you can play an important role.  Because skin is not sterile, your skin needs to be as free of germs as possible.  You can reduce the number of germs on you skin by washing with CHG (chlorahexidine gluconate) soap before surgery.  CHG is an antiseptic cleaner which kills germs and bonds with the skin to continue killing germs even after washing.  Please DO NOT use if you have an allergy to CHG or antibacterial soaps.  If your skin becomes reddened/irritated stop using the CHG and inform your nurse when you arrive at Short Stay.  Do not shave (including legs and underarms) for at least 48 hours prior to the first CHG shower.  You may shave your face.  Please follow these instructions carefully:   1.  Shower with CHG Soap the night before surgery and the morning of  Surgery.  2.  If you choose to wash your hair, wash your hair first as usual with your normal shampoo.  3.  After you shampoo, rinse your hair and body thoroughly to remove the shampoo.  4.  Use CHG as you would any other liquid soap.  You can apply CHG directly to the skin and wash gently with scrungie or a clean washcloth.  5.  Apply the CHG Soap to your body ONLY FROM THE NECK DOWN.  Do not use on open wounds or open sores.  Avoid contact with your eyes, ears, mouth and genitals (private parts).  Wash genitals (private parts) with your normal soap.  6.  Wash thoroughly, paying special attention to the area where your surgery will be performed.  7.  Thoroughly rinse your body with warm water from the neck down.  8.  DO NOT shower/wash with your normal soap after using and rinsing off the CHG Soap.  9.  Pat yourself dry with a clean towel.            10.  Wear clean pajamas.            11.  Place clean sheets on your bed the night of your first shower and do not sleep with pets.  Day of Surgery  Do not apply any lotions/deoderants the morning of  surgery.  Please wear clean clothes to the hospital/surgery center.

## 2014-06-03 ENCOUNTER — Encounter (HOSPITAL_COMMUNITY)
Admission: RE | Admit: 2014-06-03 | Discharge: 2014-06-03 | Disposition: A | Discharge status: Home / Self Care | Payer: Medicare Other | Source: Ambulatory Visit | Attending: Vascular Surgery | Admitting: Vascular Surgery

## 2014-06-03 ENCOUNTER — Encounter (HOSPITAL_COMMUNITY): Payer: Self-pay

## 2014-06-03 ENCOUNTER — Encounter (HOSPITAL_COMMUNITY)
Admission: RE | Admit: 2014-06-03 | Discharge: 2014-06-03 | Disposition: A | Discharge status: Home / Self Care | Payer: Medicare Other | Source: Ambulatory Visit | Attending: Anesthesiology | Admitting: Anesthesiology

## 2014-06-03 DIAGNOSIS — Z7982 Long term (current) use of aspirin: Secondary | ICD-10-CM | POA: Diagnosis not present

## 2014-06-03 DIAGNOSIS — E119 Type 2 diabetes mellitus without complications: Secondary | ICD-10-CM | POA: Insufficient documentation

## 2014-06-03 DIAGNOSIS — I517 Cardiomegaly: Secondary | ICD-10-CM | POA: Insufficient documentation

## 2014-06-03 DIAGNOSIS — Z7902 Long term (current) use of antithrombotics/antiplatelets: Secondary | ICD-10-CM | POA: Insufficient documentation

## 2014-06-03 DIAGNOSIS — I724 Aneurysm of artery of lower extremity: Secondary | ICD-10-CM | POA: Insufficient documentation

## 2014-06-03 DIAGNOSIS — Z01812 Encounter for preprocedural laboratory examination: Secondary | ICD-10-CM | POA: Diagnosis not present

## 2014-06-03 DIAGNOSIS — I10 Essential (primary) hypertension: Secondary | ICD-10-CM | POA: Diagnosis not present

## 2014-06-03 DIAGNOSIS — Z951 Presence of aortocoronary bypass graft: Secondary | ICD-10-CM | POA: Diagnosis not present

## 2014-06-03 DIAGNOSIS — Z01818 Encounter for other preprocedural examination: Secondary | ICD-10-CM | POA: Diagnosis not present

## 2014-06-03 HISTORY — DX: Ventral hernia without obstruction or gangrene: K43.9

## 2014-06-03 LAB — URINALYSIS, ROUTINE W REFLEX MICROSCOPIC
Bilirubin Urine: NEGATIVE
Glucose, UA: NEGATIVE mg/dL
HGB URINE DIPSTICK: NEGATIVE
Ketones, ur: NEGATIVE mg/dL
Leukocytes, UA: NEGATIVE
NITRITE: NEGATIVE
PROTEIN: 100 mg/dL — AB
SPECIFIC GRAVITY, URINE: 1.023 (ref 1.005–1.030)
Urobilinogen, UA: 0.2 mg/dL (ref 0.0–1.0)
pH: 5.5 (ref 5.0–8.0)

## 2014-06-03 LAB — COMPREHENSIVE METABOLIC PANEL
ALBUMIN: 3.5 g/dL (ref 3.5–5.2)
ALK PHOS: 63 U/L (ref 39–117)
ALT: 10 U/L (ref 0–53)
AST: 17 U/L (ref 0–37)
Anion gap: 11 (ref 5–15)
BILIRUBIN TOTAL: 0.3 mg/dL (ref 0.3–1.2)
BUN: 13 mg/dL (ref 6–23)
CHLORIDE: 97 meq/L (ref 96–112)
CO2: 25 mmol/L (ref 19–32)
Calcium: 9.2 mg/dL (ref 8.4–10.5)
Creatinine, Ser: 1.45 mg/dL — ABNORMAL HIGH (ref 0.50–1.35)
GFR calc Af Amer: 56 mL/min — ABNORMAL LOW (ref >90)
GFR calc non Af Amer: 49 mL/min — ABNORMAL LOW (ref >90)
Glucose, Bld: 103 mg/dL — ABNORMAL HIGH (ref 70–99)
POTASSIUM: 3.7 mmol/L (ref 3.5–5.1)
SODIUM: 133 mmol/L — AB (ref 135–145)
TOTAL PROTEIN: 7.6 g/dL (ref 6.0–8.3)

## 2014-06-03 LAB — URINE MICROSCOPIC-ADD ON

## 2014-06-03 LAB — CBC
HCT: 38.4 % — ABNORMAL LOW (ref 39.0–52.0)
HEMOGLOBIN: 12.2 g/dL — AB (ref 13.0–17.0)
MCH: 27.9 pg (ref 26.0–34.0)
MCHC: 31.8 g/dL (ref 30.0–36.0)
MCV: 87.7 fL (ref 78.0–100.0)
Platelets: 475 10*3/uL — ABNORMAL HIGH (ref 150–400)
RBC: 4.38 MIL/uL (ref 4.22–5.81)
RDW: 16.8 % — ABNORMAL HIGH (ref 11.5–15.5)
WBC: 11.4 10*3/uL — AB (ref 4.0–10.5)

## 2014-06-03 LAB — APTT: aPTT: 37 seconds (ref 24–37)

## 2014-06-03 LAB — SURGICAL PCR SCREEN
MRSA, PCR: NEGATIVE
Staphylococcus aureus: NEGATIVE

## 2014-06-03 LAB — TYPE AND SCREEN
ABO/RH(D): O NEG
Antibody Screen: NEGATIVE

## 2014-06-03 LAB — PROTIME-INR
INR: 1 (ref 0.00–1.49)
PROTHROMBIN TIME: 13.3 s (ref 11.6–15.2)

## 2014-06-04 ENCOUNTER — Encounter (HOSPITAL_COMMUNITY): Payer: Self-pay

## 2014-06-04 NOTE — Progress Notes (Signed)
Anesthesia Chart Review:  Patient is a 69 year old male scheduled for left above knee to below knee popliteal artery bypass on 06/08/14 by Dr. Donnetta Hutching. He has a left popliteal artery aneurysm.  Other history includes former smoker, CAD/MI s/p CABG '01, ischemic CM, afib, angioedema (d/t ACEI), DM2, HTN, thoracoabdominal aneurysm repair (s/p FEVAR with 4 vessel fenestration 11/19/13 by Dr. Sammuel Hines at [facility]), PAD, OSA with CPAP, arthritis, carotid occlusive disease (mild in 2012), nephrolithiasis, Bells Palsy, bleeds freely (due to ASA per patient, but no known bleeding disorder). PCP is listed as Dr. Rosita Fire. Cardiologist is Dr. Domenic Polite who cleared patient with low to intermediate perioperative cardiac risk.  Meds include ASA 81 mg, Coreg, Lasix, Neurontin, glipizide, Imdur, Tradjenta, metformin, Nitro, Zocor.    05/26/14 EKG: NSR, non-specific T wave abnormality. Small Q waves in inferior leads.  09/04/13 Nuclear stress test: Overall intermediate risk Lexiscan Cardiolite. There were no diagnostic ST segment abnormalities. Perfusion imaging is consistent with scar involving the inferior wall with minor peri-infarct ischemia in the mid lateral wall. LVEF is calculated at 39% with diffuse hypokinesis and near akinesis of the mid to basal inferior wall, mildly dilated left ventricle. Overall consistent with history of ischemic cardiomyopathy.  07/24/13 Echo: - Left ventricle: Technically limited study. I cannot see endocardium in all views. The images from 05/2011 wereclearer. At that time, inferior wall wass seen better.There was severe hypokinesis of the base/mid inferiorwall. The current images suggest the same, but I can notfully assess this area. The EF may be in the 45-50% range. The cavity size was normal. Wall thickness was increased in a pattern of moderate LVH. Regional wall motion abnormalities cannot be excluded. - Aortic valve: Sclerosis without stenosis. Noregurgitation. - Left atrium: The  atrium was mildly dilated. - Right ventricle: Poorly visualized. The cavity size was normal. Systolic function was normal.  09/11/07 cardiac cath findings: "...totaled LAD, totaled RCA, and totaled OM. The SVG to OM was totaled, which is a new finding. The LIMA to LAD, the SVG to diagonal, and ramus intermedius, and the SVG to RCA were patent. His EF was 35%."  03/05/14 Renal/Mesenteric artery duplex (Care Everywhere): Right:No evidence of renal artery stenosis. Kidney size and RI are WNL. There is evidence of flow in the right renal vein. Left:No evidence of renal artery stenosis. Kidney size andRI are WNL. There is evidence of flow in the left renal vein. Mesenteric:Normal SMA findings.   Other: Interrogation of the mesenteric arteries was limiteddue to the limitations listed above.  12/25/13 CT Endo chest/abd/pelvis (Care Everywhere): -- Interval placement of a fenestrated aortobiiliac endovascular stent without evidence for endoleak, kinking, migration or component separation. Residual aneurysm sac size is stable from pre-placement measurements. -- Bilateral renal hypoperfusion. -- Right nephrolithiasis without evidence for obstruction, unchanged.  03/08/11 Carotid duplex: 0-39% bilateral ICA stenosis.   06/03/14 CXR: 1. Prior CABG. Stable cardiomegaly. No CHF. No acute pulmonary disease. 2. Aortic stent graft.  Preoperative labs noted. Cr 1.45, stable.   He has cardiac clearance.  If no acute changes then I would anticipate that he could proceed as planned.  George Hugh [facility] Short Stay Center/Anesthesiology Phone [phone] 06/04/2014 11:00 AM

## 2014-06-07 MED ORDER — DEXTROSE 5 % IV SOLN
1.5000 g | INTRAVENOUS | Status: AC
  Administered 2014-06-08 (×2): 1.5 g via INTRAVENOUS
  Filled 2014-06-07: qty 1.5

## 2014-06-08 ENCOUNTER — Inpatient Hospital Stay (HOSPITAL_COMMUNITY): Payer: Medicare Other

## 2014-06-08 ENCOUNTER — Encounter (HOSPITAL_COMMUNITY)
Admission: RE | Disposition: A | Discharge status: Skilled Nursing Facility | Payer: Self-pay | Source: Ambulatory Visit | Attending: Vascular Surgery

## 2014-06-08 ENCOUNTER — Encounter (HOSPITAL_COMMUNITY): Payer: Self-pay | Admitting: *Deleted

## 2014-06-08 ENCOUNTER — Inpatient Hospital Stay (HOSPITAL_COMMUNITY): Payer: Medicare Other | Admitting: Critical Care Medicine

## 2014-06-08 ENCOUNTER — Inpatient Hospital Stay (HOSPITAL_COMMUNITY)
Admission: RE | Admit: 2014-06-08 | Discharge: 2014-06-13 | DRG: 253 | Disposition: A | Discharge status: Skilled Nursing Facility | Payer: Medicare Other | Source: Ambulatory Visit | Attending: Vascular Surgery | Admitting: Vascular Surgery

## 2014-06-08 ENCOUNTER — Inpatient Hospital Stay (HOSPITAL_COMMUNITY): Payer: Medicare Other | Admitting: Vascular Surgery

## 2014-06-08 DIAGNOSIS — E119 Type 2 diabetes mellitus without complications: Secondary | ICD-10-CM | POA: Diagnosis present

## 2014-06-08 DIAGNOSIS — Z01810 Encounter for preprocedural cardiovascular examination: Secondary | ICD-10-CM | POA: Diagnosis not present

## 2014-06-08 DIAGNOSIS — I252 Old myocardial infarction: Secondary | ICD-10-CM

## 2014-06-08 DIAGNOSIS — Z8249 Family history of ischemic heart disease and other diseases of the circulatory system: Secondary | ICD-10-CM

## 2014-06-08 DIAGNOSIS — Z01818 Encounter for other preprocedural examination: Secondary | ICD-10-CM | POA: Diagnosis not present

## 2014-06-08 DIAGNOSIS — M25562 Pain in left knee: Secondary | ICD-10-CM | POA: Diagnosis not present

## 2014-06-08 DIAGNOSIS — Z951 Presence of aortocoronary bypass graft: Secondary | ICD-10-CM

## 2014-06-08 DIAGNOSIS — I724 Aneurysm of artery of lower extremity: Principal | ICD-10-CM | POA: Diagnosis present

## 2014-06-08 DIAGNOSIS — M6281 Muscle weakness (generalized): Secondary | ICD-10-CM | POA: Diagnosis not present

## 2014-06-08 DIAGNOSIS — E785 Hyperlipidemia, unspecified: Secondary | ICD-10-CM | POA: Diagnosis not present

## 2014-06-08 DIAGNOSIS — I739 Peripheral vascular disease, unspecified: Secondary | ICD-10-CM | POA: Diagnosis present

## 2014-06-08 DIAGNOSIS — I2510 Atherosclerotic heart disease of native coronary artery without angina pectoris: Secondary | ICD-10-CM | POA: Diagnosis present

## 2014-06-08 DIAGNOSIS — Z7982 Long term (current) use of aspirin: Secondary | ICD-10-CM | POA: Diagnosis not present

## 2014-06-08 DIAGNOSIS — I4891 Unspecified atrial fibrillation: Secondary | ICD-10-CM | POA: Diagnosis present

## 2014-06-08 DIAGNOSIS — I5022 Chronic systolic (congestive) heart failure: Secondary | ICD-10-CM | POA: Diagnosis not present

## 2014-06-08 DIAGNOSIS — I714 Abdominal aortic aneurysm, without rupture: Secondary | ICD-10-CM | POA: Diagnosis present

## 2014-06-08 DIAGNOSIS — I10 Essential (primary) hypertension: Secondary | ICD-10-CM | POA: Diagnosis present

## 2014-06-08 DIAGNOSIS — D62 Acute posthemorrhagic anemia: Secondary | ICD-10-CM | POA: Diagnosis not present

## 2014-06-08 DIAGNOSIS — G4730 Sleep apnea, unspecified: Secondary | ICD-10-CM | POA: Diagnosis present

## 2014-06-08 DIAGNOSIS — Z48812 Encounter for surgical aftercare following surgery on the circulatory system: Secondary | ICD-10-CM | POA: Diagnosis not present

## 2014-06-08 DIAGNOSIS — Z419 Encounter for procedure for purposes other than remedying health state, unspecified: Secondary | ICD-10-CM

## 2014-06-08 DIAGNOSIS — R278 Other lack of coordination: Secondary | ICD-10-CM | POA: Diagnosis not present

## 2014-06-08 DIAGNOSIS — Z9889 Other specified postprocedural states: Secondary | ICD-10-CM | POA: Diagnosis not present

## 2014-06-08 DIAGNOSIS — I255 Ischemic cardiomyopathy: Secondary | ICD-10-CM | POA: Diagnosis present

## 2014-06-08 DIAGNOSIS — E1149 Type 2 diabetes mellitus with other diabetic neurological complication: Secondary | ICD-10-CM | POA: Diagnosis not present

## 2014-06-08 DIAGNOSIS — E876 Hypokalemia: Secondary | ICD-10-CM | POA: Diagnosis not present

## 2014-06-08 DIAGNOSIS — Z87891 Personal history of nicotine dependence: Secondary | ICD-10-CM | POA: Diagnosis not present

## 2014-06-08 DIAGNOSIS — Z7901 Long term (current) use of anticoagulants: Secondary | ICD-10-CM | POA: Diagnosis not present

## 2014-06-08 DIAGNOSIS — R262 Difficulty in walking, not elsewhere classified: Secondary | ICD-10-CM | POA: Diagnosis not present

## 2014-06-08 HISTORY — PX: BYPASS GRAFT POPLITEAL TO POPLITEAL: SHX5763

## 2014-06-08 LAB — CBC
HEMATOCRIT: 32.2 % — AB (ref 39.0–52.0)
Hemoglobin: 10.2 g/dL — ABNORMAL LOW (ref 13.0–17.0)
MCH: 27.8 pg (ref 26.0–34.0)
MCHC: 31.7 g/dL (ref 30.0–36.0)
MCV: 87.7 fL (ref 78.0–100.0)
PLATELETS: 409 10*3/uL — AB (ref 150–400)
RBC: 3.67 MIL/uL — AB (ref 4.22–5.81)
RDW: 17.1 % — AB (ref 11.5–15.5)
WBC: 13.4 10*3/uL — ABNORMAL HIGH (ref 4.0–10.5)

## 2014-06-08 LAB — GLUCOSE, CAPILLARY
GLUCOSE-CAPILLARY: 120 mg/dL — AB (ref 70–99)
GLUCOSE-CAPILLARY: 110 mg/dL — AB (ref 70–99)
Glucose-Capillary: 112 mg/dL — ABNORMAL HIGH (ref 70–99)
Glucose-Capillary: 132 mg/dL — ABNORMAL HIGH (ref 70–99)

## 2014-06-08 LAB — CREATININE, SERUM
CREATININE: 1.49 mg/dL — AB (ref 0.50–1.35)
GFR calc non Af Amer: 47 mL/min — ABNORMAL LOW (ref >90)
GFR, EST AFRICAN AMERICAN: 55 mL/min — AB (ref >90)

## 2014-06-08 SURGERY — CREATION, BYPASS, ARTERIAL, POPLITEAL
Anesthesia: General | Laterality: Left

## 2014-06-08 MED ORDER — LIDOCAINE HCL (CARDIAC) 20 MG/ML IV SOLN
INTRAVENOUS | Status: DC | PRN
  Administered 2014-06-08: 80 mg via INTRAVENOUS

## 2014-06-08 MED ORDER — NITROGLYCERIN 0.4 MG SL SUBL: 0.4000 mg | SUBLINGUAL_TABLET | SUBLINGUAL | Status: DC | PRN

## 2014-06-08 MED ORDER — SUCCINYLCHOLINE CHLORIDE 20 MG/ML IJ SOLN
INTRAMUSCULAR | Status: AC
Start: 2014-06-08 — End: 2014-06-08
  Filled 2014-06-08: qty 1

## 2014-06-08 MED ORDER — CEFUROXIME SODIUM 1.5 G IJ SOLR
1.5000 g | Freq: Two times a day (BID) | INTRAMUSCULAR | Status: AC
  Administered 2014-06-08 – 2014-06-09 (×2): 1.5 g via INTRAVENOUS
  Filled 2014-06-08 (×2): qty 1.5

## 2014-06-08 MED ORDER — 0.9 % SODIUM CHLORIDE (POUR BTL) OPTIME
TOPICAL | Status: DC | PRN
  Administered 2014-06-08: 2000 mL

## 2014-06-08 MED ORDER — LABETALOL HCL 5 MG/ML IV SOLN
INTRAVENOUS | Status: DC | PRN
  Administered 2014-06-08: 5 mg via INTRAVENOUS

## 2014-06-08 MED ORDER — CHLORHEXIDINE GLUCONATE CLOTH 2 % EX PADS: 6.0000 | MEDICATED_PAD | Freq: Once | CUTANEOUS | Status: DC

## 2014-06-08 MED ORDER — LINAGLIPTIN 5 MG PO TABS
5.0000 mg | ORAL_TABLET | Freq: Every day | ORAL | Status: DC
  Administered 2014-06-08 – 2014-06-13 (×6): 5 mg via ORAL
  Filled 2014-06-08 (×6): qty 1

## 2014-06-08 MED ORDER — ALUM & MAG HYDROXIDE-SIMETH 200-200-20 MG/5ML PO SUSP: 15.0000 mL | ORAL | Status: DC | PRN

## 2014-06-08 MED ORDER — ONDANSETRON HCL 4 MG/2ML IJ SOLN
INTRAMUSCULAR | Status: DC | PRN
  Administered 2014-06-08: 4 mg via INTRAVENOUS

## 2014-06-08 MED ORDER — PANTOPRAZOLE SODIUM 40 MG PO TBEC
40.0000 mg | DELAYED_RELEASE_TABLET | Freq: Every day | ORAL | Status: DC
  Administered 2014-06-08 – 2014-06-13 (×6): 40 mg via ORAL
  Filled 2014-06-08 (×5): qty 1

## 2014-06-08 MED ORDER — GLIPIZIDE ER 10 MG PO TB24
10.0000 mg | ORAL_TABLET | Freq: Two times a day (BID) | ORAL | Status: DC
  Administered 2014-06-08 – 2014-06-13 (×11): 10 mg via ORAL
  Filled 2014-06-08 (×12): qty 1

## 2014-06-08 MED ORDER — GABAPENTIN 300 MG PO CAPS
300.0000 mg | ORAL_CAPSULE | Freq: Two times a day (BID) | ORAL | Status: DC
  Administered 2014-06-08 – 2014-06-13 (×10): 300 mg via ORAL
  Filled 2014-06-08 (×11): qty 1

## 2014-06-08 MED ORDER — MAGNESIUM SULFATE 2 GM/50ML IV SOLN
2.0000 g | Freq: Every day | INTRAVENOUS | Status: DC | PRN
  Filled 2014-06-08: qty 50

## 2014-06-08 MED ORDER — ACETAMINOPHEN 500 MG PO TABS
1000.0000 mg | ORAL_TABLET | Freq: Two times a day (BID) | ORAL | Status: DC | PRN
  Administered 2014-06-10: 1000 mg via ORAL
  Filled 2014-06-08: qty 2

## 2014-06-08 MED ORDER — OXYCODONE-ACETAMINOPHEN 5-325 MG PO TABS
1.0000 | ORAL_TABLET | ORAL | Status: DC | PRN
Start: 2014-06-08 — End: 2014-06-13
  Administered 2014-06-08 – 2014-06-13 (×14): 1 via ORAL
  Filled 2014-06-08 (×4): qty 1
  Filled 2014-06-08: qty 2
  Filled 2014-06-08 (×7): qty 1
  Filled 2014-06-08: qty 2
  Filled 2014-06-08: qty 1

## 2014-06-08 MED ORDER — PROTAMINE SULFATE 10 MG/ML IV SOLN
INTRAVENOUS | Status: DC | PRN
  Administered 2014-06-08: 50 mg via INTRAVENOUS

## 2014-06-08 MED ORDER — ESMOLOL HCL 10 MG/ML IV SOLN
INTRAVENOUS | Status: AC
  Filled 2014-06-08: qty 10

## 2014-06-08 MED ORDER — SODIUM CHLORIDE 0.9 % IV SOLN
INTRAVENOUS | Status: DC
  Administered 2014-06-08 (×2): via INTRAVENOUS

## 2014-06-08 MED ORDER — GLYCOPYRROLATE 0.2 MG/ML IJ SOLN
INTRAMUSCULAR | Status: DC | PRN
  Administered 2014-06-08: 0.6 mg via INTRAVENOUS

## 2014-06-08 MED ORDER — SODIUM CHLORIDE 0.9 % IV SOLN: 500.0000 mL | Freq: Once | INTRAVENOUS | Status: AC | PRN

## 2014-06-08 MED ORDER — MORPHINE SULFATE 2 MG/ML IJ SOLN
INTRAMUSCULAR | Status: AC
  Filled 2014-06-08: qty 2

## 2014-06-08 MED ORDER — SODIUM CHLORIDE 0.9 % IV SOLN: INTRAVENOUS | Status: DC

## 2014-06-08 MED ORDER — FUROSEMIDE 40 MG PO TABS
40.0000 mg | ORAL_TABLET | Freq: Two times a day (BID) | ORAL | Status: DC
  Administered 2014-06-08 – 2014-06-13 (×10): 40 mg via ORAL
  Filled 2014-06-08 (×12): qty 1

## 2014-06-08 MED ORDER — BISACODYL 10 MG RE SUPP: 10.0000 mg | Freq: Every day | RECTAL | Status: DC | PRN

## 2014-06-08 MED ORDER — FLEET ENEMA 7-19 GM/118ML RE ENEM: 1.0000 | ENEMA | Freq: Once | RECTAL | Status: AC | PRN

## 2014-06-08 MED ORDER — PHENYLEPHRINE HCL 10 MG/ML IJ SOLN
INTRAMUSCULAR | Status: DC | PRN
  Administered 2014-06-08 (×5): 40 ug via INTRAVENOUS

## 2014-06-08 MED ORDER — HEPARIN SODIUM (PORCINE) 1000 UNIT/ML IJ SOLN
INTRAMUSCULAR | Status: DC | PRN
  Administered 2014-06-08: 4000 [IU] via INTRAVENOUS
  Administered 2014-06-08: 9000 [IU] via INTRAVENOUS

## 2014-06-08 MED ORDER — GLYCOPYRROLATE 0.2 MG/ML IJ SOLN
INTRAMUSCULAR | Status: AC
  Filled 2014-06-08: qty 3

## 2014-06-08 MED ORDER — PHENYLEPHRINE HCL 10 MG/ML IJ SOLN
10.0000 mg | INTRAVENOUS | Status: DC | PRN
  Administered 2014-06-08: 20 ug/min via INTRAVENOUS

## 2014-06-08 MED ORDER — CHLORHEXIDINE GLUCONATE CLOTH 2 % EX PADS
6.0000 | MEDICATED_PAD | Freq: Once | CUTANEOUS | Status: DC
Start: 2014-06-08 — End: 2014-06-08

## 2014-06-08 MED ORDER — ISOSORBIDE MONONITRATE ER 60 MG PO TB24
120.0000 mg | ORAL_TABLET | Freq: Every morning | ORAL | Status: DC
Start: 2014-06-09 — End: 2014-06-13
  Administered 2014-06-09 – 2014-06-13 (×5): 120 mg via ORAL
  Filled 2014-06-08 (×5): qty 2

## 2014-06-08 MED ORDER — ASPIRIN EC 81 MG PO TBEC
81.0000 mg | DELAYED_RELEASE_TABLET | Freq: Every day | ORAL | Status: DC
  Administered 2014-06-08 – 2014-06-13 (×6): 81 mg via ORAL
  Filled 2014-06-08 (×6): qty 1

## 2014-06-08 MED ORDER — PHENOL 1.4 % MT LIQD: 1.0000 | OROMUCOSAL | Status: DC | PRN

## 2014-06-08 MED ORDER — HEPARIN SODIUM (PORCINE) 1000 UNIT/ML IJ SOLN
INTRAMUSCULAR | Status: AC
  Filled 2014-06-08: qty 1

## 2014-06-08 MED ORDER — PROPOFOL 10 MG/ML IV BOLUS
INTRAVENOUS | Status: AC
  Filled 2014-06-08: qty 20

## 2014-06-08 MED ORDER — CARVEDILOL 12.5 MG PO TABS
12.5000 mg | ORAL_TABLET | Freq: Two times a day (BID) | ORAL | Status: DC
  Administered 2014-06-08 – 2014-06-13 (×11): 12.5 mg via ORAL
  Filled 2014-06-08 (×12): qty 1

## 2014-06-08 MED ORDER — SIMVASTATIN 40 MG PO TABS
40.0000 mg | ORAL_TABLET | Freq: Every day | ORAL | Status: DC
  Administered 2014-06-08 – 2014-06-12 (×5): 40 mg via ORAL
  Filled 2014-06-08 (×6): qty 1

## 2014-06-08 MED ORDER — PROMETHAZINE HCL 25 MG/ML IJ SOLN: 6.2500 mg | INTRAMUSCULAR | Status: DC | PRN

## 2014-06-08 MED ORDER — POTASSIUM CHLORIDE CRYS ER 20 MEQ PO TBCR
20.0000 meq | EXTENDED_RELEASE_TABLET | Freq: Every day | ORAL | Status: DC | PRN
Start: 2014-06-08 — End: 2014-06-13

## 2014-06-08 MED ORDER — SODIUM CHLORIDE 0.9 % IR SOLN
Status: DC | PRN
  Administered 2014-06-08: 09:00:00

## 2014-06-08 MED ORDER — PROPOFOL 10 MG/ML IV BOLUS
INTRAVENOUS | Status: DC | PRN
  Administered 2014-06-08: 140 mg via INTRAVENOUS
  Administered 2014-06-08: 40 mg via INTRAVENOUS
  Administered 2014-06-08: 20 mg via INTRAVENOUS

## 2014-06-08 MED ORDER — FENTANYL CITRATE 0.05 MG/ML IJ SOLN
INTRAMUSCULAR | Status: DC | PRN
  Administered 2014-06-08: 50 ug via INTRAVENOUS
  Administered 2014-06-08 (×3): 25 ug via INTRAVENOUS
  Administered 2014-06-08 (×2): 50 ug via INTRAVENOUS
  Administered 2014-06-08: 25 ug via INTRAVENOUS
  Administered 2014-06-08: 50 ug via INTRAVENOUS

## 2014-06-08 MED ORDER — MORPHINE SULFATE 2 MG/ML IJ SOLN
2.0000 mg | INTRAMUSCULAR | Status: DC | PRN
  Administered 2014-06-09: 4 mg via INTRAVENOUS
  Filled 2014-06-08: qty 2

## 2014-06-08 MED ORDER — NEOSTIGMINE METHYLSULFATE 10 MG/10ML IV SOLN
INTRAVENOUS | Status: DC | PRN
  Administered 2014-06-08: 4 mg via INTRAVENOUS

## 2014-06-08 MED ORDER — SENNOSIDES-DOCUSATE SODIUM 8.6-50 MG PO TABS
1.0000 | ORAL_TABLET | Freq: Every evening | ORAL | Status: DC | PRN
  Filled 2014-06-08: qty 1

## 2014-06-08 MED ORDER — DOCUSATE SODIUM 100 MG PO CAPS
100.0000 mg | ORAL_CAPSULE | Freq: Every day | ORAL | Status: DC
  Administered 2014-06-09 – 2014-06-13 (×5): 100 mg via ORAL
  Filled 2014-06-08 (×5): qty 1

## 2014-06-08 MED ORDER — ESMOLOL HCL 10 MG/ML IV SOLN
INTRAVENOUS | Status: DC | PRN
  Administered 2014-06-08 (×2): 20 mg via INTRAVENOUS

## 2014-06-08 MED ORDER — INSULIN ASPART 100 UNIT/ML ~~LOC~~ SOLN
0.0000 [IU] | Freq: Three times a day (TID) | SUBCUTANEOUS | Status: DC
  Administered 2014-06-08 – 2014-06-09 (×2): 2 [IU] via SUBCUTANEOUS
  Administered 2014-06-09: 3 [IU] via SUBCUTANEOUS
  Administered 2014-06-09 – 2014-06-13 (×6): 2 [IU] via SUBCUTANEOUS

## 2014-06-08 MED ORDER — NEOSTIGMINE METHYLSULFATE 10 MG/10ML IV SOLN
INTRAVENOUS | Status: AC
Start: 2014-06-08 — End: 2014-06-08
  Filled 2014-06-08: qty 1

## 2014-06-08 MED ORDER — ENOXAPARIN SODIUM 30 MG/0.3ML ~~LOC~~ SOLN
30.0000 mg | SUBCUTANEOUS | Status: DC
  Administered 2014-06-08: 30 mg via SUBCUTANEOUS
  Filled 2014-06-08 (×2): qty 0.3

## 2014-06-08 MED ORDER — ONDANSETRON HCL 4 MG/2ML IJ SOLN: 4.0000 mg | Freq: Four times a day (QID) | INTRAMUSCULAR | Status: DC | PRN

## 2014-06-08 MED ORDER — FENTANYL CITRATE 0.05 MG/ML IJ SOLN
INTRAMUSCULAR | Status: AC
Start: 2014-06-08 — End: 2014-06-08
  Filled 2014-06-08: qty 5

## 2014-06-08 MED ORDER — EPHEDRINE SULFATE 50 MG/ML IJ SOLN
INTRAMUSCULAR | Status: AC
  Filled 2014-06-08: qty 1

## 2014-06-08 MED ORDER — MIDAZOLAM HCL 5 MG/5ML IJ SOLN
INTRAMUSCULAR | Status: DC | PRN
  Administered 2014-06-08: 2 mg via INTRAVENOUS

## 2014-06-08 MED ORDER — ONDANSETRON HCL 4 MG/2ML IJ SOLN
INTRAMUSCULAR | Status: AC
  Filled 2014-06-08: qty 2

## 2014-06-08 MED ORDER — ALLOPURINOL 100 MG PO TABS
100.0000 mg | ORAL_TABLET | Freq: Every day | ORAL | Status: DC
  Administered 2014-06-08 – 2014-06-13 (×6): 100 mg via ORAL
  Filled 2014-06-08 (×6): qty 1

## 2014-06-08 MED ORDER — GUAIFENESIN-DM 100-10 MG/5ML PO SYRP: 15.0000 mL | ORAL_SOLUTION | ORAL | Status: DC | PRN

## 2014-06-08 MED ORDER — CETYLPYRIDINIUM CHLORIDE 0.05 % MT LIQD
7.0000 mL | Freq: Two times a day (BID) | OROMUCOSAL | Status: DC
  Administered 2014-06-08 – 2014-06-13 (×10): 7 mL via OROMUCOSAL

## 2014-06-08 MED ORDER — LABETALOL HCL 5 MG/ML IV SOLN
10.0000 mg | INTRAVENOUS | Status: DC | PRN
  Filled 2014-06-08: qty 4

## 2014-06-08 MED ORDER — IODIXANOL 320 MG/ML IV SOLN
INTRAVENOUS | Status: DC | PRN
  Administered 2014-06-08: 50 mL via INTRAVENOUS

## 2014-06-08 MED ORDER — PHENYLEPHRINE 40 MCG/ML (10ML) SYRINGE FOR IV PUSH (FOR BLOOD PRESSURE SUPPORT)
PREFILLED_SYRINGE | INTRAVENOUS | Status: AC
  Filled 2014-06-08: qty 10

## 2014-06-08 MED ORDER — ROCURONIUM BROMIDE 50 MG/5ML IV SOLN
INTRAVENOUS | Status: AC
  Filled 2014-06-08: qty 1

## 2014-06-08 MED ORDER — DEXTROSE 5 % IV SOLN
1.5000 g | INTRAVENOUS | Status: DC
  Filled 2014-06-08: qty 1.5

## 2014-06-08 MED ORDER — HYDRALAZINE HCL 20 MG/ML IJ SOLN: 5.0000 mg | INTRAMUSCULAR | Status: DC | PRN

## 2014-06-08 MED ORDER — LIDOCAINE HCL (CARDIAC) 20 MG/ML IV SOLN
INTRAVENOUS | Status: AC
  Filled 2014-06-08: qty 5

## 2014-06-08 MED ORDER — MIDAZOLAM HCL 2 MG/2ML IJ SOLN
INTRAMUSCULAR | Status: AC
  Filled 2014-06-08: qty 2

## 2014-06-08 MED ORDER — SODIUM CHLORIDE 0.9 % IJ SOLN
INTRAMUSCULAR | Status: AC
  Filled 2014-06-08: qty 10

## 2014-06-08 MED ORDER — METOPROLOL TARTRATE 1 MG/ML IV SOLN: 2.0000 mg | INTRAVENOUS | Status: DC | PRN

## 2014-06-08 MED ORDER — LACTATED RINGERS IV SOLN
INTRAVENOUS | Status: DC | PRN
  Administered 2014-06-08 (×2): via INTRAVENOUS

## 2014-06-08 MED ORDER — ROCURONIUM BROMIDE 100 MG/10ML IV SOLN
INTRAVENOUS | Status: DC | PRN
  Administered 2014-06-08: 50 mg via INTRAVENOUS

## 2014-06-08 MED ORDER — MORPHINE SULFATE 2 MG/ML IJ SOLN
1.0000 mg | INTRAMUSCULAR | Status: DC | PRN
  Administered 2014-06-08 (×2): 2 mg via INTRAVENOUS

## 2014-06-08 SURGICAL SUPPLY — 59 items
BENZOIN TINCTURE PRP APPL 2/3 (GAUZE/BANDAGES/DRESSINGS) ×6 IMPLANT
CANISTER SUCTION 2500CC (MISCELLANEOUS) ×3 IMPLANT
CANNULA VESSEL 3MM 2 BLNT TIP (CANNULA) ×6 IMPLANT
CLIP LIGATING EXTRA MED SLVR (CLIP) ×3 IMPLANT
CLIP LIGATING EXTRA SM BLUE (MISCELLANEOUS) ×3 IMPLANT
CLOSURE WOUND 1/2 X4 (GAUZE/BANDAGES/DRESSINGS) ×1
CLOSURE WOUND 1/4X4 (GAUZE/BANDAGES/DRESSINGS) ×2
COVER PROBE W GEL 5X96 (DRAPES) ×3 IMPLANT
COVER SURGICAL LIGHT HANDLE (MISCELLANEOUS) ×3 IMPLANT
DRAPE X-RAY CASS 24X20 (DRAPES) ×3 IMPLANT
DRSG COVADERM 4X10 (GAUZE/BANDAGES/DRESSINGS) ×3 IMPLANT
DRSG COVADERM 4X8 (GAUZE/BANDAGES/DRESSINGS) ×6 IMPLANT
ELECT REM PT RETURN 9FT ADLT (ELECTROSURGICAL) ×3
ELECTRODE REM PT RTRN 9FT ADLT (ELECTROSURGICAL) ×1 IMPLANT
GAUZE SPONGE 4X4 12PLY STRL (GAUZE/BANDAGES/DRESSINGS) ×3 IMPLANT
GLOVE BIO SURGEON STRL SZ7.5 (GLOVE) ×3 IMPLANT
GLOVE BIOGEL PI IND STRL 6.5 (GLOVE) ×2 IMPLANT
GLOVE BIOGEL PI IND STRL 7.0 (GLOVE) ×4 IMPLANT
GLOVE BIOGEL PI IND STRL 7.5 (GLOVE) ×1 IMPLANT
GLOVE BIOGEL PI IND STRL 8 (GLOVE) ×1 IMPLANT
GLOVE BIOGEL PI INDICATOR 6.5 (GLOVE) ×4
GLOVE BIOGEL PI INDICATOR 7.0 (GLOVE) ×8
GLOVE BIOGEL PI INDICATOR 7.5 (GLOVE) ×2
GLOVE BIOGEL PI INDICATOR 8 (GLOVE) ×2
GLOVE ECLIPSE 6.5 STRL STRAW (GLOVE) ×9 IMPLANT
GLOVE ECLIPSE 7.0 STRL STRAW (GLOVE) ×3 IMPLANT
GLOVE SS BIOGEL STRL SZ 7.5 (GLOVE) ×1 IMPLANT
GLOVE SUPERSENSE BIOGEL SZ 7.5 (GLOVE) ×2
GLOVE SURG SS PI 7.0 STRL IVOR (GLOVE) ×3 IMPLANT
GOWN STRL REUS W/ TWL LRG LVL3 (GOWN DISPOSABLE) ×5 IMPLANT
GOWN STRL REUS W/ TWL XL LVL3 (GOWN DISPOSABLE) ×2 IMPLANT
GOWN STRL REUS W/TWL LRG LVL3 (GOWN DISPOSABLE) ×10
GOWN STRL REUS W/TWL XL LVL3 (GOWN DISPOSABLE) ×4
KIT BASIN OR (CUSTOM PROCEDURE TRAY) ×3 IMPLANT
KIT ROOM TURNOVER OR (KITS) ×3 IMPLANT
NS IRRIG 1000ML POUR BTL (IV SOLUTION) ×6 IMPLANT
PACK PERIPHERAL VASCULAR (CUSTOM PROCEDURE TRAY) ×3 IMPLANT
PAD ARMBOARD 7.5X6 YLW CONV (MISCELLANEOUS) ×6 IMPLANT
SET COLLECT BLD 21X3/4 12 (NEEDLE) ×3 IMPLANT
SPONGE LAP 18X18 X RAY DECT (DISPOSABLE) ×3 IMPLANT
STOPCOCK 4 WAY LG BORE MALE ST (IV SETS) ×3 IMPLANT
STRIP CLOSURE SKIN 1/2X4 (GAUZE/BANDAGES/DRESSINGS) ×2 IMPLANT
STRIP CLOSURE SKIN 1/4X4 (GAUZE/BANDAGES/DRESSINGS) ×4 IMPLANT
SUT ETHIBOND 5 LR DA (SUTURE) ×3 IMPLANT
SUT PROLENE 5 0 C 1 24 (SUTURE) ×3 IMPLANT
SUT PROLENE 6 0 CC (SUTURE) ×24 IMPLANT
SUT SILK 2 0 SH (SUTURE) ×3 IMPLANT
SUT SILK 3 0 (SUTURE) ×2
SUT SILK 3-0 18XBRD TIE 12 (SUTURE) ×1 IMPLANT
SUT VIC AB 2-0 CT1 27 (SUTURE) ×4
SUT VIC AB 2-0 CT1 TAPERPNT 27 (SUTURE) ×2 IMPLANT
SUT VIC AB 2-0 CTX 36 (SUTURE) ×6 IMPLANT
SUT VIC AB 3-0 SH 27 (SUTURE) ×8
SUT VIC AB 3-0 SH 27X BRD (SUTURE) ×4 IMPLANT
SYRINGE 10CC LL (SYRINGE) ×3 IMPLANT
TRAY FOLEY CATH 16FRSI W/METER (SET/KITS/TRAYS/PACK) ×3 IMPLANT
TUBING EXTENTION W/L.L. (IV SETS) ×3 IMPLANT
UNDERPAD 30X30 INCONTINENT (UNDERPADS AND DIAPERS) ×3 IMPLANT
WATER STERILE IRR 1000ML POUR (IV SOLUTION) ×3 IMPLANT

## 2014-06-08 NOTE — Anesthesia Postprocedure Evaluation (Signed)
  Anesthesia Post-op Note  Patient: [name]  Procedure(s) Performed: Procedure(s): BYPASS GRAFT FEMORAL ARTERY TO ABOVE KNEE POPLITEAL (Left)  Patient Location: PACU  Anesthesia Type:General  Level of Consciousness: awake and alert   Airway and Oxygen Therapy: Patient Spontanous Breathing  Post-op Pain: mild  Post-op Assessment: Post-op Vital signs reviewed and Patient's Cardiovascular Status Stable  Post-op Vital Signs: stable  Last Vitals:  Filed Vitals:   06/08/14 1500  BP:   Pulse: 75  Temp:   Resp: 11    Complications: No apparent anesthesia complications

## 2014-06-08 NOTE — Transfer of Care (Signed)
Immediate Anesthesia Transfer of Care Note  Patient: [name]  Procedure(s) Performed: Procedure(s): BYPASS GRAFT FEMORAL ARTERY TO ABOVE KNEE POPLITEAL (Left)  Patient Location: PACU  Anesthesia Type:General  Level of Consciousness: sedated  Airway & Oxygen Therapy: Patient Spontanous Breathing and Patient connected to nasal cannula oxygen  Post-op Assessment: Report given to PACU RN, Post -op Vital signs reviewed and stable and Patient moving all extremities X 4  Post vital signs: Reviewed and stable  Complications: No apparent anesthesia complications

## 2014-06-08 NOTE — Anesthesia Preprocedure Evaluation (Addendum)
Anesthesia Evaluation  Patient identified by MRN, date of birth, ID band Patient awake    Reviewed: Allergy & Precautions, NPO status , Patient's Chart, lab work & pertinent test results, reviewed documented beta blocker date and time   Airway Mallampati: I       Dental  (+) Dental Advisory Given   Pulmonary sleep apnea , former smoker,  breath sounds clear to auscultation        Cardiovascular hypertension, Pt. on medications and Pt. on home beta blockers + CAD, + Past MI and + Peripheral Vascular Disease Rhythm:Regular Rate:Normal  EF 45-50% echo (2015)   Neuro/Psych    GI/Hepatic   Endo/Other  diabetes, Type 2, Oral Hypoglycemic Agents  Renal/GU      Musculoskeletal  (+) Arthritis -,   Abdominal   Peds  Hematology  (+) anemia ,   Anesthesia Other Findings   Reproductive/Obstetrics                         Anesthesia Physical Anesthesia Plan  ASA: IV  Anesthesia Plan: General   Post-op Pain Management:    Induction: Intravenous  Airway Management Planned: Oral ETT  Additional Equipment: Arterial line  Intra-op Plan:   Post-operative Plan: Extubation in OR  Informed Consent: I have reviewed the patients History and Physical, chart, labs and discussed the procedure including the risks, benefits and alternatives for the proposed anesthesia with the patient or authorized representative who has indicated his/her understanding and acceptance.   Dental advisory given  Plan Discussed with: Anesthesiologist and Surgeon  Anesthesia Plan Comments:        Anesthesia Quick Evaluation

## 2014-06-08 NOTE — Progress Notes (Signed)
Patient ID: [name], male   DOB: 07/02/1945, 69 y.o.   MRN: [id number] Comfortable in step down unit. Slight blood staining on groin dressing. Groins soft with no evidence of hematoma. Mild to moderate incisional soreness. Left foot well-perfused with palpable left dorsalis pedis pulse. Plan transfer to 2 W. tomorrow possibly on Wednesday. He understands I will be out of town for the rest of the week and my partners will be following him

## 2014-06-08 NOTE — Interval H&P Note (Signed)
History and Physical Interval Note:  06/08/2014 7:01 AM  [name]  has presented today for surgery, with the diagnosis of Left popliteal artery aneurysm I72.4  The various methods of treatment have been discussed with the patient and family. After consideration of risks, benefits and other options for treatment, the patient has consented to  Procedure(s): BYPASS GRAFT ABOVE KNEE POPLITEAL TO BELOW KNEE POPLITEAL (Left) as a surgical intervention .  The patient's history has been reviewed, patient examined, no change in status, stable for surgery.  I have reviewed the patient's chart and labs.  Questions were answered to the patient's satisfaction.     EARLY, TODD

## 2014-06-08 NOTE — Op Note (Signed)
OPERATIVE REPORT  DATE OF SURGERY: 06/08/2014  PATIENT: [name], 69 y.o. male MRN: [id number]  DOB: 11/26/1945  PRE-OPERATIVE DIAGNOSIS: Left distal superficial femoral artery aneurysm  POST-OPERATIVE DIAGNOSIS:  Same  PROCEDURE: Left femoral to above-knee popliteal bypass with translocated non-reversed great saphenous vein  SURGEON:  Curt Jews, M.D.  PHYSICIAN ASSISTANT: Collins  ANESTHESIA:  Gen.  EBL: 200 ml  Total I/O In: 1800 [I.V.:1800] Out: 460 [Urine:260; Blood:200]  BLOOD ADMINISTERED: None  DRAINS: None  SPECIMEN: None  COUNTS CORRECT:  YES  PLAN OF CARE: PACU   PATIENT DISPOSITION:  PACU - hemodynamically stable  PROCEDURE DETAILS: The patient was taken to the operative placed in a short area of the left groin left leg prepped and draped in usual sterile fashion. Imaging revealed that the large show 3 cm distal superficial femoral artery aneurysm ended above the popliteal artery and the above-knee position. There was some ectasia of this and the tibial runoff by CT angiogram. SonoSite ultrasound was used to mark the level of the saphenous vein from the mid calf up to the groin. Incision was made in the above-knee position over the saphenous vein keratinized saphenous vein which was moderate size at this level. Tributary branches were ligated and divided. Next the fascia was opened and the above-knee popliteal artery was exposed. The large aneurysm was easily palpable in the upper portion of this. The aneurysm ended and the artery became normal caliber above the knee. This was encircled with a vessel loop. Next incision was made over the vein in the mid thigh. Again the vein was moderate-sized this level and was again tributary branches ligated with 3 or 4-0 silk ties and divided. The superficial femoral artery was exposed by opening the fascia. There was some ectasia of the artery above this as well. The pulse was moderate at this level. CT angiogram  did show some irregularity and atherosclerotic changes of the superficial femoral artery compared to the groin. There was some irregularity throughout the artery and was some concern of using this as an inflow vessel. The vein was patent and therefore decision was made to come off the femoral artery at the groin. Incision was made in the groin and carried down through the scar to the area of the femoral artery. The patient had a prior stent graft repair outlying hospital. There was some moderate scarring at this level. The superficial femoral artery at just at its takeoff was of normal caliber and with no atherosclerotic change in a good pulse. There was a 6 extensive scarring further proximally in the groin. The saphenous vein was ligated at the saphenofemoral junction and retropatellar branches were ligated and divided the saphenous vein was harvested from the groin to the level of the knee vein was gently dilated divided. The tunnel was created from the level of the popliteal artery just above the knee to the level of the groin. The patient was given 9000 units intravenous heparin circulation time the superficial femoral artery was occluded just the takeoff of the profunda and distally. The artery was opened and was of normal caliber with no significant atherosclerotic change. The vein graft was brought onto the field and was sewn end-to-side to the superficial artery with a running 6-0 Prolene suture. Anastomosis tested and found to be adequate. Next the vein valves were lysed with the Jerelene Redden valvulotome this gave good flow through the vein graft. The vein was brought to the prior created tunnel down to the level of  the popliteal artery at the knee. The native superficial femoral artery above the aneurysm was ligated with a braided Ethibond suture. The popliteal artery was occluded just above the knee and the artery above this level was also ligated this aneurysm from circulation. The popliteal artery was  transected and there was atherosclerotic change but good caliber. There was some size mismatch in the vein and the larger popliteal artery. The graft was sewn into into the popliteal artery with a running 6-0 Prolene suture. This was done in a running fashion. The anastomosis was completed after the usual flushing maneuvers. There was Doppler flow below this but no Doppler flow at the foot. Intraoperative arteriogram showed PROBABLY some eccentric plaque causing a high-grade stenosis. For this reason the artery was reoccluded proximally and distally and the anastomosis was taken down starting. The popliteal artery was spatulated further distally and there was endarterectomy some plaque present. The vein was irrigated with saline and no evidence of thrombus. This was then sewn again in 2 and spatulated both the vein and the artery with a running 6-0 Prolene suture. 3 dilator passed easily proximally and distally prior to completion of the anastomosis. Anastomosis was completed and flow was restored to the foot. There was good Doppler flow at the foot as well. Patient was given 50 mg of protamine to reverse heparin. Wounds irrigated with saline. Hemostasis tablet cautery. Wounds were closed with 2-0 Vicryl in the fascia and 3-0 subcuticular Vicryl. Sterile dressing was applied and the patient transferred to the recovery room stable condition   Curt Jews, M.D. 06/08/2014 1:12 PM

## 2014-06-08 NOTE — Anesthesia Procedure Notes (Signed)
Procedure Name: Intubation Date/Time: 06/08/2014 8:06 AM Performed by: Carola Frost Pre-anesthesia Checklist: Patient identified, Emergency Drugs available, Patient being monitored, Timeout performed and Suction available Patient Re-evaluated:Patient Re-evaluated prior to inductionOxygen Delivery Method: Circle system utilized Preoxygenation: Pre-oxygenation with 100% oxygen Intubation Type: IV induction and Cricoid Pressure applied Ventilation: Mask ventilation without difficulty and Oral airway inserted - appropriate to patient size Laryngoscope Size: Mac and 4 Grade View: Grade I Tube type: Oral Tube size: 7.5 mm Number of attempts: 1 Placement Confirmation: ETT inserted through vocal cords under direct vision,  positive ETCO2,  CO2 detector and breath sounds checked- equal and bilateral Secured at: 23 cm Tube secured with: Tape Dental Injury: Teeth and Oropharynx as per pre-operative assessment

## 2014-06-08 NOTE — Progress Notes (Signed)
        Patient is comfortable pain well controlled.  Incision clean and dry.   Left foot Doppler DP/PT biphasic.  Palpable DP.  Disposition stable  COLLINS, EMMA MAUREEN PA-C

## 2014-06-08 NOTE — H&P (View-Only) (Signed)
Patient presents today for kidney follow-up of his left popliteal artery aneurysm. We have followed this over the course of several years. Most recently his Doppler duplex in May 2015 revealed a 2.8 cm distal superficial femoral artery aneurysm. Today he went for a CT angiogram for further evaluation. This was of his abdomen pelvis and runoff. He did have a known juxtarenal aneurysm which were treated with stent grafting at [facility] in July of this year. He has followed up with Dr. Sammuel Hines since then.  He reports a bilateral hip pain which appears to be more arthritic or potentially degenerative disc disease then claudication. He does have right calf claudication with known occlusion of his superficial artery which is chronic.  Past Medical History  Diagnosis Date  . Ischemic cardiomyopathy     LVEF 45-50% 11/2011  . Coronary atherosclerosis of native coronary artery     Multivessel status post CABG  . Angioedema     Secondary to ACE inhibitor  . Bell palsy   . Type 2 diabetes mellitus   . AAA (abdominal aortic aneurysm)     Followed by Dr. Donnetta Hutching  . Essential hypertension, benign   . PAD (peripheral artery disease)     Followed by Dr. Donnetta Hutching  . Sleep apnea   . Arthritis   . Carotid artery disease   . Atrial fibrillation   . Myocardial infarction 2001  . Nephrolithiasis     History  Substance Use Topics  . Smoking status: Former Smoker -- 1.00 packs/day for 25 years    Types: Cigarettes    Quit date: 05/23/2007  . Smokeless tobacco: Never Used  . Alcohol Use: No    Family History  Problem Relation Age of Onset  . Diabetes Mother     Type  I   . Varicose Veins Mother   . Heart disease Father 58    AAA  . AAA (abdominal aortic aneurysm) Father   . Hyperlipidemia Father   . Hypertension Father     Allergies  Allergen Reactions  . Ace Inhibitors Anaphylaxis  . Codeine Other (See Comments)    REACTION: delirium  . Lisinopril Anaphylaxis and Swelling  .  Hydromorphone Nausea And Vomiting    Other reaction(s): NAUSEA    Current outpatient prescriptions: acetaminophen (TYLENOL) 325 MG tablet, Take 325 mg by mouth as needed., Disp: , Rfl: ;  allopurinol (ZYLOPRIM) 100 MG tablet, Take 100 mg by mouth daily., Disp: , Rfl: ;  aspirin EC 81 MG tablet, Take 81 mg by mouth daily., Disp: , Rfl: ;  carvedilol (COREG) 25 MG tablet, Take 12.5 mg by mouth 2 (two) times daily with a meal. , Disp: , Rfl:  clopidogrel (PLAVIX) 75 MG tablet, Take 75 mg by mouth daily., Disp: , Rfl: ;  furosemide (LASIX) 40 MG tablet, Take 40 mg by mouth 2 (two) times daily. , Disp: , Rfl: ;  gabapentin (NEURONTIN) 300 MG capsule, Take 300 mg by mouth 2 (two) times daily., Disp: , Rfl: ;  glipiZIDE (GLUCOTROL) 10 MG tablet, Take 10 mg by mouth 2 (two) times daily before a meal. , Disp: , Rfl:  isosorbide mononitrate (IMDUR) 60 MG 24 hr tablet, Take 2 tablets (120 mg total) by mouth every morning. & 30 mg in the evening, Disp: 90 tablet, Rfl: 3;  linagliptin (TRADJENTA) 5 MG TABS tablet, Take 5 mg by mouth daily.  , Disp: , Rfl: ;  nitroGLYCERIN (NITROSTAT) 0.4 MG SL tablet, Place 0.4 mg under the  tongue every 5 (five) minutes as needed for chest pain., Disp: , Rfl:  simvastatin (ZOCOR) 40 MG tablet, Take 1 tablet (40 mg total) by mouth at bedtime., Disp: 30 tablet, Rfl: 6;  metFORMIN (GLUCOPHAGE) 1000 MG tablet, Take 1,000 mg by mouth 2 (two) times daily with a meal.  , Disp: , Rfl:   BP 158/83 mmHg  Pulse 71  Resp 16  Ht 6' (1.829 m)  Wt 231 lb (104.781 kg)  BMI 31.32 kg/m2  Body mass index is 31.32 kg/(m^2).       Physical exam he does have obesity. I cannot palpate an abdominal aortic aneurysm. He does have a large umbilical hernia. Reducible. He does have a 2+ femoral pulses bilaterally. He has a very prominent popliteal pulse in the left and a palpable dorsalis pedis pulse in the left. He has absent popliteal and distal pulses in the right. Does have a prior vein harvest  from his ankle to his knee bilaterally. I imaged the saphenous vein above this on the left he does have a patent saphenous vein from his knee proximally.  I reviewed his CT scan discussed this with the patient. At this does show a type I distal endoleak from his prior stent graft in the right iliac artery. He reports that this is known and is being followed in [facility]. He does have a very large popliteal artery aneurysm on the left which is 3.1 cm in diameter. His right SFA is occluded.  Pression and plan large 3.1 cm Pop to artery aneurysm on the left which is continue to enlarge by serial duplex follow-up. I have recommended repair. He will see his cardiologist in [facility] for a preoperative clearance. He is to follow-up with Dr. Sammuel Hines in [facility] and will obtain a copy of his CT to take this with him. We will schedule left popliteal artery aneurysm repair following cardiac clearance

## 2014-06-09 LAB — CBC
HCT: 31 % — ABNORMAL LOW (ref 39.0–52.0)
Hemoglobin: 9.8 g/dL — ABNORMAL LOW (ref 13.0–17.0)
MCH: 27.8 pg (ref 26.0–34.0)
MCHC: 31.6 g/dL (ref 30.0–36.0)
MCV: 88.1 fL (ref 78.0–100.0)
Platelets: 387 10*3/uL (ref 150–400)
RBC: 3.52 MIL/uL — AB (ref 4.22–5.81)
RDW: 17.2 % — ABNORMAL HIGH (ref 11.5–15.5)
WBC: 9 10*3/uL (ref 4.0–10.5)

## 2014-06-09 LAB — GLUCOSE, CAPILLARY
GLUCOSE-CAPILLARY: 148 mg/dL — AB (ref 70–99)
GLUCOSE-CAPILLARY: 141 mg/dL — AB (ref 70–99)
Glucose-Capillary: 162 mg/dL — ABNORMAL HIGH (ref 70–99)
Glucose-Capillary: 148 mg/dL — ABNORMAL HIGH (ref 70–99)

## 2014-06-09 LAB — BASIC METABOLIC PANEL
Anion gap: 6 (ref 5–15)
BUN: 10 mg/dL (ref 6–23)
CO2: 29 mmol/L (ref 19–32)
Calcium: 8.3 mg/dL — ABNORMAL LOW (ref 8.4–10.5)
Chloride: 103 mEq/L (ref 96–112)
Creatinine, Ser: 1.48 mg/dL — ABNORMAL HIGH (ref 0.50–1.35)
GFR, EST AFRICAN AMERICAN: 55 mL/min — AB (ref >90)
GFR, EST NON AFRICAN AMERICAN: 48 mL/min — AB (ref >90)
GLUCOSE: 113 mg/dL — AB (ref 70–99)
Potassium: 3.9 mmol/L (ref 3.5–5.1)
SODIUM: 138 mmol/L (ref 135–145)

## 2014-06-09 LAB — HEMOGLOBIN A1C
HEMOGLOBIN A1C: 6.3 % — AB (ref <5.7)
Mean Plasma Glucose: 134 mg/dL — ABNORMAL HIGH (ref <117)

## 2014-06-09 MED ORDER — ENOXAPARIN SODIUM 30 MG/0.3ML ~~LOC~~ SOLN
30.0000 mg | SUBCUTANEOUS | Status: DC
  Administered 2014-06-10 – 2014-06-13 (×4): 30 mg via SUBCUTANEOUS
  Filled 2014-06-09 (×5): qty 0.3

## 2014-06-09 NOTE — Evaluation (Addendum)
Occupational Therapy Evaluation Patient Details Name: [name] MRN: [id number] DOB: July 31, 1945 Today's Date: 06/09/2014    History of Present Illness pt presents with L Fem pop 2/2 L Femoral Artery Aneurysm.     Clinical Impression   Pt s/p above. Pt independent with ADLs, PTA. Feel pt will benefit from acute OT to increase independence with BADLs, prior to d/c.     Follow Up Recommendations  Home health OT;Supervision - Intermittent-for OOB/mobility   Equipment Recommendations  3 in 1 bedside comode;Other (comment) (AE)    Recommendations for Other Services       Precautions / Restrictions Precautions Precautions: Fall Restrictions Weight Bearing Restrictions: No      Mobility Bed Mobility               General bed mobility comments: not assessed  Transfers Overall transfer level: Needs assistance Equipment used: Rolling walker (2 wheeled) Transfers: Sit to/from Stand Sit to Stand: Mod assist         General transfer comment: assist to boost from chair; cues for technique. Pt with uncontrolled descent to chair.         ADL Overall ADL's : Needs assistance/impaired     Grooming: Wash/dry face;Set up;Supervision/safety;Standing               Lower Body Dressing: Sit to/from stand;Minimal assistance;With adaptive equipment   Toilet Transfer: Min guard;Ambulation;RW (chair; Mod A to stand from chair)           Functional mobility during ADLs: Min guard;Rolling walker General ADL Comments: Educated on LB dressing technique. Educated on safety such as safe shoewear, use of bag on walker, sitting for LB ADLs. Educated on AE and pt practiced with reacher and sockaid. Educated on options for shower chair and tub transfer techniques and recommended someone being with him for tub transfer. Educated on 3 in 1.     Vision                     Perception     Praxis      Pertinent Vitals/Pain Pain Assessment: 0-10 Pain Score: 5   Pain Location:  Lt LE Pain Intervention(s): Repositioned;Monitored during session     Hand Dominance     Extremity/Trunk Assessment Upper Extremity Assessment Upper Extremity Assessment: Overall WFL for tasks assessed   Lower Extremity Assessment Lower Extremity Assessment: Defer to PT evaluation       Communication Communication Communication: No difficulties   Cognition Arousal/Alertness: Awake/alert Behavior During Therapy: WFL for tasks assessed/performed Overall Cognitive Status: Within Functional Limits for tasks assessed       Memory: Decreased short-term memory             General Comments       Exercises       Shoulder Instructions      Home Living Family/patient expects to be discharged to:: Private residence Living Arrangements: Alone Available Help at Discharge: Family;Available PRN/intermittently Type of Home: House Home Access: Stairs to enter [employer] of Steps: 2 Entrance Stairs-Rails: None Home Layout: One level     Bathroom Shower/Tub: Tub/shower unit (family member has walk in)   Biochemist, clinical: Standard     Home Equipment: Environmental consultant - 4 wheels;Cane - single point; long brush/sponge   Additional Comments: pt notes he can stay with one of his children if needed.        Prior Functioning/Environment Level of Independence: Independent  OT Diagnosis: Acute pain   OT Problem List: Decreased strength;Decreased range of motion;Decreased activity tolerance;Decreased knowledge of use of DME or AE;Decreased knowledge of precautions;Pain   OT Treatment/Interventions: Self-care/ADL training;Therapeutic activities;Patient/family education;Balance training;DME and/or AE instruction    OT Goals(Current goals can be found in the care plan section) Acute Rehab OT Goals Patient Stated Goal: not stated OT Goal Formulation: With patient Time For Goal Achievement: 06/16/14 Potential to Achieve Goals: Good ADL  Goals Pt Will Perform Lower Body Dressing: with set-up;with supervision;sit to/from stand;with adaptive equipment Pt Will Transfer to Toilet: ambulating;with modified independence (3 in 1 over commode) Pt Will Perform Tub/Shower Transfer: Tub transfer;with supervision;ambulating;shower seat;3 in 1;rolling walker  OT Frequency: Min 2X/week   Barriers to D/C:            Co-evaluation              End of Session Equipment Utilized During Treatment: Gait belt;Rolling walker  Activity Tolerance: Patient tolerated treatment well Patient left: in chair;with call bell/phone within reach   Time: 0952-1019 OT Time Calculation (min): 27 min Charges:  OT General Charges $OT Visit: 1 Procedure OT Evaluation $Initial OT Evaluation Tier I: 1 Procedure OT Treatments $Self Care/Home Management : 8-22 mins G-CodesBenito [name] OTR/L [id number] 06/09/2014, 1:01 PM

## 2014-06-09 NOTE — Progress Notes (Signed)
Utilization Review Completed.Josh Nicolosi T1/19/2016  

## 2014-06-09 NOTE — Progress Notes (Signed)
Transfer report received from 3S RN at 1130 and pt arrived to the unit via wheelchair with his belongings to the side at 1210. Pt A&O x4; vitals taken, telemetry applied and called in to CCMD; pain assessed; pt oriented to the unit and room; pt assisted to the room recliner chair, pt sitting up with call light within reach; leg incision clean, dry and intact. Will continue to monitor pt quietly. Francis Gaines Malanie Koloski RN.

## 2014-06-09 NOTE — Progress Notes (Addendum)
  Progress Note    06/09/2014 8:23 AM 1 Day Post-Op  Subjective:  States he is more sore than he anticipated being.  States he is numb in his mid thigh area, which the feeling was just starting to return from his last surgery.  Afebrile 0000000 systolic HR [id number] NSR 0000000 [id number]  Gtts:  none  Filed Vitals:   06/09/14 0350  BP: 142/73  Pulse: 75  Temp: 98.8 F (37.1 C)  Resp:     Physical Exam: Lungs:  Non labored Incisions:  Saphenous vein harvest sites are healing nicely with minimal bloody drainage.  There is bloody draiange from the left groin.  Bandage and steri strips removed.  Wound is in tact with slow bloody ooze.  Extremities:  + biphasic doppler signals in the left PT/DP   CBC    Component Value Date/Time   WBC 9.0 06/09/2014 0347   RBC 3.52* 06/09/2014 0347   HGB 9.8* 06/09/2014 0347   HCT 31.0* 06/09/2014 0347   PLT 387 06/09/2014 0347   MCV 88.1 06/09/2014 0347   MCH 27.8 06/09/2014 0347   MCHC 31.6 06/09/2014 0347   RDW 17.2* 06/09/2014 0347   LYMPHSABS 3.4 11/10/2013 0505   MONOABS 2.0* 11/10/2013 0505   EOSABS 0.4 11/10/2013 0505   BASOSABS 0.1 11/10/2013 0505    BMET    Component Value Date/Time   NA 138 06/09/2014 0347   K 3.9 06/09/2014 0347   CL 103 06/09/2014 0347   CO2 29 06/09/2014 0347   GLUCOSE 113* 06/09/2014 0347   BUN 10 06/09/2014 0347   CREATININE 1.48* 06/09/2014 0347   CREATININE 1.52* 05/08/2014 1434   CALCIUM 8.3* 06/09/2014 0347   GFRNONAA 48* 06/09/2014 0347   GFRAA 55* 06/09/2014 0347    INR    Component Value Date/Time   INR 1.00 06/03/2014 0953     Intake/Output Summary (Last 24 hours) at 06/09/14 0823 Last data filed at 06/09/14 0700  Gross per 24 hour  Intake 5062.5 ml  Output   1910 ml  Net 3152.5 ml     Assessment:  69 y.o. male is s/p:  Left femoral to above-knee popliteal bypass with translocated non-reversed great saphenous vein  1 Day Post-Op  Plan: -pt doing well with biphasic left  PT/DP. -he has had some bleeding in his left groin with old clot removed from outside of wound (wound continues to be in tact). -acute surgical blood loss anemia-tolerating -will check CBC/BMP in am -DVT prophylaxis:  Lovenox-would hold tonight's dose for bleeding in left groin. Changed time for pt not to receive Lovenox today and restart tomorrow morning at 0900 -dry gauze to left groin daily and as needed to help wick moisture and help prevent wound infection. -continue to increase mobilization.     Leontine Locket, PA-C [facility] [phone] 06/09/2014 8:23 AM    Some evidence of hematoma in left thigh with swelling and some drainage Also edema in foot Continue to mobilize Agree with holding Lovenox Trend hemoglobin Transfer 2W Saline lock IV  Ruta Hinds, MD [facility] [place] Office: [phone] Pager: [phone]

## 2014-06-09 NOTE — Evaluation (Signed)
Physical Therapy Evaluation Patient Details Name: [name] MRN: [id number] DOB: November 07, 1945 Today's Date: 06/09/2014   History of Present Illness  pt presents with L Fem pop 2/2 L Femoral Artery Aneurysm.    Clinical Impression  Pt very motivated to mobilize, but limited by pain in L LE.  Pt notes he can stay with family if needed at D/C.  Will continue to follow while on acute.      Follow Up Recommendations Home health PT;Supervision for mobility/OOB    Equipment Recommendations  Rolling walker with 5" wheels;3in1 (PT)    Recommendations for Other Services       Precautions / Restrictions Precautions Precautions: Fall Restrictions Weight Bearing Restrictions: No      Mobility  Bed Mobility               General bed mobility comments: pt sitting in recliner.    Transfers Overall transfer level: Needs assistance Equipment used: Rolling walker (2 wheeled) Transfers: Sit to/from Stand Sit to Stand: Min assist         General transfer comment: cues for UE use and positioning of L LE.    Ambulation/Gait Ambulation/Gait assistance: Min guard Ambulation Distance (Feet): 40 Feet Assistive device: Rolling walker (2 wheeled) Gait Pattern/deviations: Step-to pattern;Decreased step length - right;Decreased stance time - left     General Gait Details: pt leans heavily on RW 2/2 pain in L LE.    Stairs            Wheelchair Mobility    Modified Rankin (Stroke Patients Only)       Balance Overall balance assessment: Needs assistance Sitting-balance support: No upper extremity supported;Feet supported Sitting balance-Leahy Scale: Fair Sitting balance - Comments: Difficulty with balance challenges.     Standing balance support: Single extremity supported;During functional activity Standing balance-Leahy Scale: Poor Standing balance comment: Needs at least single UE support 2/2 pain.                               Pertinent  Vitals/Pain Pain Assessment: 0-10 Pain Score: 8  Pain Location: L Thigh Pain Descriptors / Indicators: Aching;Constant Pain Intervention(s): Monitored during session;Premedicated before session;Repositioned    Home Living Family/patient expects to be discharged to:: Private residence Living Arrangements: Alone Available Help at Discharge: Family;Available PRN/intermittently Type of Home: House Home Access: Stairs to enter Entrance Stairs-Rails: None Entrance Stairs-Number of Steps: 2 Home Layout: One level Home Equipment: Walker - 4 wheels;Cane - single point Additional Comments: pt notes he can stay with one of his children if needed.      Prior Function Level of Independence: Independent               Hand Dominance        Extremity/Trunk Assessment   Upper Extremity Assessment: Defer to OT evaluation           Lower Extremity Assessment: LLE deficits/detail   LLE Deficits / Details: ROM and Strength limited by post-op pain and edema.  pt with decreased soft touch sensation throughout LE until hip area sensation becomes normal.    Cervical / Trunk Assessment: Normal  Communication   Communication: No difficulties  Cognition Arousal/Alertness: Awake/alert Behavior During Therapy: WFL for tasks assessed/performed Overall Cognitive Status: Within Functional Limits for tasks assessed                      General Comments  Exercises        Assessment/Plan    PT Assessment Patient needs continued PT services  PT Diagnosis Difficulty walking;Acute pain   PT Problem List Decreased strength;Decreased activity tolerance;Decreased balance;Decreased mobility;Decreased knowledge of use of DME;Impaired sensation;Pain  PT Treatment Interventions DME instruction;Gait training;Stair training;Functional mobility training;Therapeutic activities;Therapeutic exercise;Balance training;Patient/family education   PT Goals (Current goals can be found in the  Care Plan section) Acute Rehab PT Goals Patient Stated Goal: My leg to work again.   PT Goal Formulation: With patient Time For Goal Achievement: 06/16/14 Potential to Achieve Goals: Good    Frequency Min 3X/week   Barriers to discharge        Co-evaluation               End of Session Equipment Utilized During Treatment: Gait belt Activity Tolerance: Patient limited by pain Patient left: in chair;with call bell/phone within reach Nurse Communication: Mobility status         Time: 0820-0848 PT Time Calculation (min) (ACUTE ONLY): 28 min   Charges:   PT Evaluation $Initial PT Evaluation Tier I: 1 Procedure PT Treatments $Gait Training: 8-22 mins   PT G CodesCatarina Hartshorn, [place] 06/09/2014, 9:01 AM

## 2014-06-10 ENCOUNTER — Encounter (HOSPITAL_COMMUNITY): Payer: Self-pay | Admitting: Vascular Surgery

## 2014-06-10 DIAGNOSIS — Z01810 Encounter for preprocedural cardiovascular examination: Secondary | ICD-10-CM

## 2014-06-10 LAB — BASIC METABOLIC PANEL
Anion gap: 9 (ref 5–15)
BUN: 17 mg/dL (ref 6–23)
CHLORIDE: 102 meq/L (ref 96–112)
CO2: 26 mmol/L (ref 19–32)
Calcium: 8.2 mg/dL — ABNORMAL LOW (ref 8.4–10.5)
Creatinine, Ser: 1.59 mg/dL — ABNORMAL HIGH (ref 0.50–1.35)
GFR calc Af Amer: 51 mL/min — ABNORMAL LOW (ref >90)
GFR calc non Af Amer: 44 mL/min — ABNORMAL LOW (ref >90)
GLUCOSE: 102 mg/dL — AB (ref 70–99)
Potassium: 3.4 mmol/L — ABNORMAL LOW (ref 3.5–5.1)
Sodium: 137 mmol/L (ref 135–145)

## 2014-06-10 LAB — CBC
HCT: 27.9 % — ABNORMAL LOW (ref 39.0–52.0)
Hemoglobin: 8.9 g/dL — ABNORMAL LOW (ref 13.0–17.0)
MCH: 27.9 pg (ref 26.0–34.0)
MCHC: 31.9 g/dL (ref 30.0–36.0)
MCV: 87.5 fL (ref 78.0–100.0)
Platelets: 368 10*3/uL (ref 150–400)
RBC: 3.19 MIL/uL — ABNORMAL LOW (ref 4.22–5.81)
RDW: 17 % — AB (ref 11.5–15.5)
WBC: 11 10*3/uL — ABNORMAL HIGH (ref 4.0–10.5)

## 2014-06-10 LAB — GLUCOSE, CAPILLARY
GLUCOSE-CAPILLARY: 136 mg/dL — AB (ref 70–99)
Glucose-Capillary: 91 mg/dL (ref 70–99)
Glucose-Capillary: 140 mg/dL — ABNORMAL HIGH (ref 70–99)
Glucose-Capillary: 108 mg/dL — ABNORMAL HIGH (ref 70–99)

## 2014-06-10 NOTE — Progress Notes (Signed)
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      PT Comments    Pt progressing with ambulation but still having trouble with transfers, requiring min (25%) A. Pt has lift chair at home but still recommend supervision for mobility, which pt is reporting that he is unsure of family availability. Encouraged him to speak with family and see if someone could at least stay with him first couple of nights home. If no help available and pt at current LOF, other rehab options may need to be discussed. Spoke with nursing staff about increasing ambulation through the day today. PT will continue to follow.   Follow Up Recommendations  Home health PT;Supervision for mobility/OOB     Equipment Recommendations  Rolling walker with 5" wheels;3in1 (PT)    Recommendations for Other Services       Precautions / Restrictions Precautions Precautions: Fall Restrictions Weight Bearing Restrictions: No    Mobility  Bed Mobility Overal bed mobility: Needs Assistance Bed Mobility: Supine to Sit     Supine to sit: Supervision;Min assist     General bed mobility comments: pt able to elevate trunk onto left elbow and swing legs off bed with increased effort and time. Needed min A to get hips fully to EOB. vc's throughout movement to increase pt's independence  Transfers Overall transfer level: Needs assistance Equipment used: Rolling walker (2 wheeled) Transfers: Sit to/from Stand Sit to Stand: Min assist         General transfer comment: practiced from elevated bed and lower chair. Min-guard for higher surface (pt has lift chair at home), min A for fwd translation of wt from chair  Ambulation/Gait Ambulation/Gait assistance: Supervision Ambulation Distance (Feet): 120 Feet Assistive device: Rolling walker (2 wheeled) Gait  Pattern/deviations: Step-to pattern;Decreased stance time - left Gait velocity: decreased Gait velocity interpretation: Below normal speed for age/gender General Gait Details: pt had difficulty beginning to ambulate but once he was moving, he was able to increase pace slightly and begin step-through pattern, Swelling LLE affecting gait pattern. Still taking significant wt through RW to offset from left leg.   Stairs            Wheelchair Mobility    Modified Rankin (Stroke Patients Only)       Balance Overall balance assessment: Needs assistance Sitting-balance support: No upper extremity supported Sitting balance-Leahy Scale: Fair Sitting balance - Comments: Difficulty with balance challenges.     Standing balance support: Single extremity supported;During functional activity Standing balance-Leahy Scale: Poor Standing balance comment: cannot take full wt LLE and and not steady enough to maintain balance on RLE without UE support                    Cognition Arousal/Alertness: Awake/alert Behavior During Therapy: WFL for tasks assessed/performed Overall Cognitive Status: Within Functional Limits for tasks assessed       Memory: Decreased short-term memory              Exercises General Exercises - Lower Extremity Ankle Circles/Pumps: AROM;Both;20 reps;Seated Heel Slides: AROM;5 reps;Left;Supine    General Comments        Pertinent Vitals/Pain Pain Assessment: Faces Faces Pain Scale: Hurts little more Pain Location: LLE Pain Descriptors / Indicators: Aching;Numbness Pain Intervention(s): Monitored during session;Other (comment);Repositioned (elevated after session)  HR: 86 BPM with ambulation  Home Living               Home Equipment: Other (comment);Walker - 4 wheels;Cane - single point (lift chair)      Prior Function            PT Goals (current goals can now be found in the care plan section) Acute Rehab PT Goals Patient Stated  Goal: return home PT Goal Formulation: With patient Time For Goal Achievement: 06/16/14 Potential to Achieve Goals: Good Progress towards PT goals: Progressing toward goals    Frequency  Min 3X/week    PT Plan Current plan remains appropriate    Co-evaluation             End of Session Equipment Utilized During Treatment: Gait belt Activity Tolerance: Patient tolerated treatment well Patient left: in chair;with call bell/phone within reach     Time: 0837-0911 PT Time Calculation (min) (ACUTE ONLY): 34 min  Charges:  $Gait Training: 23-37 mins                    G Codes:     Leighton Roach, PT  Acute Rehab Services  [phone]  Leighton Roach 06/10/2014, 9:31 AM

## 2014-06-10 NOTE — Clinical Social Work Psychosocial (Signed)
Clinical Social Work Department BRIEF PSYCHOSOCIAL ASSESSMENT 06/10/2014  Patient:  [name], [name]     Account Number:  [id number]     Admit date:  06/08/2014  Clinical Social Worker:  Domenica Reamer, Craig  Date/Time:  06/10/2014 04:02 PM  Referred by:  Physician  Date Referred:  06/10/2014 Referred for  SNF Placement   Other Referral:   Interview type:  Patient Other interview type:    PSYCHOSOCIAL DATA Living Status:  ALONE Admitted from facility:   Level of care:   Primary support name:  Missy Pruitt Primary support relationship to patient:  CHILD, ADULT Degree of support available:   Patient reports high level of support from children who live locally but does not think that his children will be able to provide physical assistence for 24 hour care over the next few days to assist in DC to home.    CURRENT CONCERNS Current Concerns  Post-Acute Placement   Other Concerns:    SOCIAL WORK ASSESSMENT / PLAN CSW spoke to patient concerning going to SNF.  Patient is agreeable to SNF if he does not improve drastically in the next day and if his children are unable to provide adequate support at home.  Patient would prefer to go home but is realistic about his capabilities at this time.   Assessment/plan status:  Psychosocial Support/Ongoing Assessment of Needs Other assessment/ plan:   FL2  PASAR   Information/referral to community resources:    PATIENT'S/FAMILY'S RESPONSE TO PLAN OF CARE: Patient is agreeable to plan for SNF if he is unable to move well tomorrow or if his family can not provide extra support.  Patient is hopeful for full recovery but is unhappy that the surgery had to be more extensive than originally discussed with the medical team.       Domenica Reamer, [place] Social Worker [phone]

## 2014-06-10 NOTE — Clinical Social Work Placement (Signed)
Clinical Social Work Department CLINICAL SOCIAL WORK PLACEMENT NOTE 06/10/2014  Patient:  [name], [name]  Account Number:  [id number] [place] date:  06/08/2014  Clinical Social Worker:  Domenica Reamer, CLINICAL SOCIAL WORKER  Date/time:  06/10/2014 04:09 PM  Clinical Social Work is seeking post-discharge placement for this patient at the following level of care:   SKILLED NURSING   (*CSW will update this form in Epic as items are completed)   06/10/2014  Patient/family provided with [place] Department of Clinical Social Work's list of facilities offering this level of care within the geographic area requested by the patient (or if unable, by the patient's family).  06/10/2014  Patient/family informed of their freedom to choose among providers that offer the needed level of care, that participate in Medicare, Medicaid or managed care program needed by the patient, have an available bed and are willing to accept the patient.  06/10/2014  Patient/family informed of [facility]' ownership interest in [facility], as well as of the fact that they are under no obligation to receive care at this facility.  PASARR submitted to EDS on 06/10/2014 PASARR number received on 06/10/2014  FL2 transmitted to all facilities in geographic area requested by pt/family on  06/10/2014 FL2 transmitted to all facilities within larger geographic area on   Patient informed that his/her managed care company has contracts with or will negotiate with  certain facilities, including the following:     Patient/family informed of bed offers received:   Patient chooses bed at  Physician recommends and patient chooses bed at    Patient to be transferred to  on   Patient to be transferred to facility by  Patient and family notified of transfer on  Name of family member notified:    The following physician request were entered in Epic:   Additional Comments: Domenica Reamer, [place] Social  Worker [phone]

## 2014-06-10 NOTE — Care Management Note (Signed)
    Page 1 of 2   06/10/2014     12:48:46 PM CARE MANAGEMENT NOTE 06/10/2014  Patient:  [name], [name]   Account Number:  [id number]  Date Initiated:  06/10/2014  Documentation initiated by:  Marvetta Gibbons  Subjective/Objective Assessment:   Pt admitted s/p popliteal bypass graft     Action/Plan:   PTA pt lived at home alone, PT eval   Anticipated DC Date:  06/11/2014   Anticipated DC Plan:  [place]  In-house referral  Clinical Social Worker      DC Forensic scientist  CM consult      [facility] Choice  [place]   Choice offered to / List presented to:  C-1 Patient   DME arranged  3-N-1  Vassie Moselle      DME agency  [place] arranged  [place].   Status of service:  In process, will continue to follow Medicare Important Message given?   (If response is "NO", the following Medicare IM given date fields will be blank) Date Medicare IM given:   Medicare IM given by:   Date Additional Medicare IM given:   Additional Medicare IM given by:    Discharge Disposition:    Per UR Regulation:  Reviewed for med. necessity/level of care/duration of stay  If discussed at [place] of Stay Meetings, dates discussed:    Comments:  06/10/14- 1200- Marvetta Gibbons RN BSN [phone] PT recommending [place]- spoke with pt at bedside- pt concerned about going home alone- has children/family but has not spoken with them to see if they can assist or stay with him for first couple of days at home- encouraged pt to speak with family about this- discussed [place] and STSNF options- list for [facility] agencies provided- if pt returns home per pt choice will use [facility] for [facility] services- will need RW and 3n1- orders have been placed- have asked CSW to come by to speak with pt regarding STSNF option - will f/u with pt on d/c plans home with [facility] vs STSNF.

## 2014-06-10 NOTE — Progress Notes (Signed)
[facility] [place]  Subjective  - Still very sore   Objective 131/66 76 98.6 F (37 C) (Oral) 17 94%  Intake/Output Summary (Last 24 hours) at 06/10/14 0942 Last data filed at 06/10/14 0919  Gross per 24 hour  Intake    360 ml  Output    725 ml  Net   -365 ml   Left leg, groin incision healing Tape blisters mid thigh and above knee incision, steristrips removed today Left foot pink warm vaguely palpable DP pulse Some edema primarily left thigh  Assessment/Planning: S/p left fem pop for aneurysm.  Overall slow progress Tape blisters xeroform gauze Continue to ambulate Home when more independent probably next 1-2 days Leukocytosis and acute blood loss anemia trend for now   FIELDS,CHARLES E 06/10/2014 9:42 AM --  Laboratory Lab Results:  Recent Labs  06/09/14 0347 06/10/14 0334  WBC 9.0 11.0*  HGB 9.8* 8.9*  HCT 31.0* 27.9*  PLT 387 368   BMET  Recent Labs  06/09/14 0347 06/10/14 0334  NA 138 137  K 3.9 3.4*  CL 103 102  CO2 29 26  GLUCOSE 113* 102*  BUN 10 17  CREATININE 1.48* 1.59*  CALCIUM 8.3* 8.2*    COAG Lab Results  Component Value Date   INR 1.00 06/03/2014   INR 0.99 11/15/2010   INR 0.87 10/06/2009   No results found for: PTT

## 2014-06-10 NOTE — Progress Notes (Signed)
VASCULAR LAB PRELIMINARY  ARTERIAL  ABI completed:    RIGHT    LEFT    PRESSURE WAVEFORM  PRESSURE WAVEFORM  BRACHIAL 128 Triphasic BRACHIAL 121 Triphasic  DP 76 Monophasic DP 80 Monophasic  PT 80 Monophasic PT 81 Monophasic    RIGHT LEFT  ABI 0.63 0.63   ABIs indicate a moderate reduction in arterial flow bilaterally post operative  Annaleese Guier, RVS 06/10/2014, 3:31 PM

## 2014-06-11 LAB — GLUCOSE, CAPILLARY
GLUCOSE-CAPILLARY: 146 mg/dL — AB (ref 70–99)
GLUCOSE-CAPILLARY: 126 mg/dL — AB (ref 70–99)
Glucose-Capillary: 121 mg/dL — ABNORMAL HIGH (ref 70–99)
Glucose-Capillary: 94 mg/dL (ref 70–99)

## 2014-06-11 NOTE — Progress Notes (Signed)
Medicare Important Message given? YES  (If response is "NO", the following Medicare IM given date fields will be blank)  Date Medicare IM given: 06/11/14  Medicare IM given by:  Dahlia Client [employer]

## 2014-06-11 NOTE — Progress Notes (Addendum)
  Progress Note    06/11/2014 7:31 AM 3 Days Post-Op  Subjective:  "feeling about the same"  Afebrile HR 123456 NSR [id number] systolic [id number] RA  Filed Vitals:   06/11/14 0402  BP: 123/56  Pulse: 72  Temp: 98.5 F (36.9 C)  Resp: 16    Physical Exam: Cardiac:  regular Lungs:  Non labored Incisions:  Above the knee incisions are c/d/i-continues to have blisters where steri strips were.  Left groin is c/d/i Extremities:  Left foot is warm; +doppler signals in the left PT/DP/peroneal   CBC    Component Value Date/Time   WBC 11.0* 06/10/2014 0334   RBC 3.19* 06/10/2014 0334   HGB 8.9* 06/10/2014 0334   HCT 27.9* 06/10/2014 0334   PLT 368 06/10/2014 0334   MCV 87.5 06/10/2014 0334   MCH 27.9 06/10/2014 0334   MCHC 31.9 06/10/2014 0334   RDW 17.0* 06/10/2014 0334   LYMPHSABS 3.4 11/10/2013 0505   MONOABS 2.0* 11/10/2013 0505   EOSABS 0.4 11/10/2013 0505   BASOSABS 0.1 11/10/2013 0505    BMET    Component Value Date/Time   NA 137 06/10/2014 0334   K 3.4* 06/10/2014 0334   CL 102 06/10/2014 0334   CO2 26 06/10/2014 0334   GLUCOSE 102* 06/10/2014 0334   BUN 17 06/10/2014 0334   CREATININE 1.59* 06/10/2014 0334   CREATININE 1.52* 05/08/2014 1434   CALCIUM 8.2* 06/10/2014 0334   GFRNONAA 44* 06/10/2014 0334   GFRAA 51* 06/10/2014 0334    INR    Component Value Date/Time   INR 1.00 06/03/2014 0953     Intake/Output Summary (Last 24 hours) at 06/11/14 0731 Last data filed at 06/11/14 0403  Gross per 24 hour  Intake   1080 ml  Output   1500 ml  Net   -420 ml   ABI's 06/10/14:  RIGHT    LEFT    PRESSURE WAVEFORM  PRESSURE WAVEFORM  BRACHIAL 128 Triphasic BRACHIAL 121 Triphasic  DP 76 Monophasic DP 80 Monophasic  PT 80 Monophasic PT 81 Monophasic    RIGHT LEFT  ABI 0.63 0.63     Assessment:  69 y.o. male is s/p:  Left femoral to above-knee popliteal bypass with translocated non-reversed great saphenous vein    3 Days Post-Op  Plan: -pt doing well this am-needs to increase mobilization -continues to have blisters - continue xeroform dressing daily with kerlix wrap -pt not ready for d/c and may benefit from CIR-will consult them to get their opinion. -DVT prophylaxis:  Lovenox (renal dose) -check CBC/BMP in am   Leontine Locket, PA-C [facility] [phone] 06/11/2014 7:31 AM    Agree with above.  Pt still not really able to get out of bed without assist but is able to walk some Incisions continue to heal  Tape blisters about the same Foot warm well perfused  Awaiting disposition eval.  Ruta Hinds, MD [facility] [place]: [phone] Pager: [phone]

## 2014-06-11 NOTE — Clinical Social Work Note (Signed)
Patient agreeable to SNF placement and chooses [facility].  CSW consulted PT to get recommendation changed to SNF- pending.  CSW informed patient that CIR did not think patient is appropriate- patient not pleased that no one came to evaluate him before making this decision.  CSW will continue to follow.  Domenica Reamer, [place] Social Worker [phone]

## 2014-06-11 NOTE — Progress Notes (Signed)
Thank you for consult on [name]. Chart reviewed and note that patient is at min assist to supervision level. He's too high level for CIR and concur with  HHPT/HHOT for follow up therapy.

## 2014-06-11 NOTE — Progress Notes (Signed)
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     OT comments  Pt seen today for ADLs and functional mobility. Pt c/o increased incisional pain today, however able to ambulate into hallway to address endurance and functional mobility. Pt reports that he does not have 24/7 assistance and home and is continuing to require min (A) to stand at this time. Feel that SNF would be safest d/c prior to return home.   Follow Up Recommendations  SNF;Supervision/Assistance - 24 hour    Equipment Recommendations  3 in 1 bedside comode;Other (comment)    Recommendations for Other Services      Precautions / Restrictions Precautions Precautions: Fall Restrictions Weight Bearing Restrictions: No       Mobility Bed Mobility Overal bed mobility: Needs Assistance Bed Mobility: Supine to Sit     Supine to sit: Min assist     General bed mobility comments: Min (A) for LLE due to pain. Hand held assist to elevate trunk.   Transfers Overall transfer level: Needs assistance Equipment used: Rolling walker (2 wheeled) Transfers: Sit to/from Stand Sit to Stand: Min assist         General transfer comment: Min (A) to power up to standing and to stabilize RW.         ADL Overall ADL's : Needs assistance/impaired     Grooming: Supervision/safety;Standing                   Toilet Transfer: Minimal assistance;Ambulation;RW Toilet Transfer Details (indicate cue type and reason): min (A) to stand from bed; min guard for ambulation         Functional mobility during ADLs: Min guard;Rolling walker General ADL Comments: Pt continues to require min (A) for sit<>stand due to LE weakness and pain in L thigh. Pt reports that he will not have 24/7 assist and is now planning to d/c to SNF. Feel that this would be safest  option for pt at this time due to impaired functional mobility.                 Cognition  Aruosal/Alertness: Awake/Alert Behavior During Therapy: WFL for tasks assessed/performed Overall Cognitive Status: Within Functional Limits for tasks assessed       Memory: Decreased short-term memory                            Pertinent Vitals/ Pain       Pain Assessment: 0-10 Pain Score: 6  Pain Location: L thigh; incisional Pain Descriptors / Indicators: Aching;Sharp Pain Intervention(s): Limited activity within patient's tolerance;Monitored during session;Repositioned         Frequency Min 2X/week     Progress Toward Goals  OT Goals(current goals can now be found in the care plan section)  Progress towards OT goals: Progressing toward goals (slowly due to pain)  Acute Rehab OT Goals Patient Stated Goal: rehab then home OT Goal Formulation: With patient Time For Goal Achievement: 06/16/14 Potential to Achieve Goals: Good  Plan Discharge plan needs to be updated       End of Session Equipment Utilized During Treatment: Gait belt;Rolling walker   Activity Tolerance Patient tolerated treatment well   Patient Left in chair;with call bell/phone within reach   Nurse Communication Other (comment) (pt thigh dressing came off  during ambulation)        Time: [id number] OT Time Calculation (min): 24 min  Charges: OT General Charges $OT Visit: 1 Procedure OT Treatments $Self Care/Home Management : 8-22 mins $Therapeutic Activity: 8-22 mins  Juluis Rainier 06/11/2014, 12:11 PM   Cyndie Chime, OTR/L Occupational Therapist [phone] (pager)

## 2014-06-12 LAB — BASIC METABOLIC PANEL
Anion gap: 8 (ref 5–15)
BUN: 17 mg/dL (ref 6–23)
CO2: 29 mmol/L (ref 19–32)
Calcium: 8.4 mg/dL (ref 8.4–10.5)
Chloride: 101 mEq/L (ref 96–112)
Creatinine, Ser: 1.56 mg/dL — ABNORMAL HIGH (ref 0.50–1.35)
GFR calc non Af Amer: 45 mL/min — ABNORMAL LOW (ref >90)
GFR, EST AFRICAN AMERICAN: 52 mL/min — AB (ref >90)
Glucose, Bld: 89 mg/dL (ref 70–99)
Potassium: 3.3 mmol/L — ABNORMAL LOW (ref 3.5–5.1)
Sodium: 138 mmol/L (ref 135–145)

## 2014-06-12 LAB — GLUCOSE, CAPILLARY
GLUCOSE-CAPILLARY: 122 mg/dL — AB (ref 70–99)
Glucose-Capillary: 91 mg/dL (ref 70–99)
Glucose-Capillary: 95 mg/dL (ref 70–99)
Glucose-Capillary: 121 mg/dL — ABNORMAL HIGH (ref 70–99)

## 2014-06-12 LAB — CBC
HEMATOCRIT: 26.7 % — AB (ref 39.0–52.0)
Hemoglobin: 8.5 g/dL — ABNORMAL LOW (ref 13.0–17.0)
MCH: 28.4 pg (ref 26.0–34.0)
MCHC: 31.8 g/dL (ref 30.0–36.0)
MCV: 89.3 fL (ref 78.0–100.0)
PLATELETS: 390 10*3/uL (ref 150–400)
RBC: 2.99 MIL/uL — AB (ref 4.22–5.81)
RDW: 17.2 % — AB (ref 11.5–15.5)
WBC: 11.5 10*3/uL — AB (ref 4.0–10.5)

## 2014-06-12 MED ORDER — POTASSIUM CHLORIDE CRYS ER 20 MEQ PO TBCR
20.0000 meq | EXTENDED_RELEASE_TABLET | Freq: Once | ORAL | Status: AC
  Administered 2014-06-12: 20 meq via ORAL
  Filled 2014-06-12: qty 1

## 2014-06-12 NOTE — Progress Notes (Signed)
Utilization review completed.  

## 2014-06-12 NOTE — Clinical Social Work Note (Addendum)
Patient has a bed offer at [facility] which he accepted.  Patient should be ready to discharge on Saturday pending physician discharge orders and as long as patient is medically ready.  Jones Broom. Plessis, MSW, [place] 06/12/2014 3:05 PM

## 2014-06-12 NOTE — Progress Notes (Signed)
Physical Therapy Treatment Patient Details Name: ROLLEN TERRONEZ MRN: [id number] DOB: [birth year] Today's Date: 2014-06-22    History of Present Illness pt presents with L Fem pop 2/2 L Femoral Artery Aneurysm.      PT Comments    Patient making slow progress with mobility and gait.  Continues to be limited by pain, and have difficulty moving from sit to stand.  Agree that patient cannot manage at home alone.  Recommend ST-SNF at discharge for continued therapy.   Follow Up Recommendations  SNF;Supervision/Assistance - 24 hour     Equipment Recommendations  Rolling walker with 5" wheels;3in1 (PT)    Recommendations for Other Services       Precautions / Restrictions Precautions Precautions: Fall Restrictions Weight Bearing Restrictions: No    Mobility  Bed Mobility                  Transfers Overall transfer level: Needs assistance Equipment used: Rolling walker (2 wheeled) Transfers: Sit to/from Stand Sit to Stand: Mod assist         General transfer comment: Patient requiring mod assist to power up to standing.  Requires increased time for transfer.  Ambulation/Gait Ambulation/Gait assistance: Min guard Ambulation Distance (Feet): 140 Feet Assistive device: Rolling walker (2 wheeled) Gait Pattern/deviations: Step-to pattern;Decreased stance time - left;Decreased stride length;Decreased weight shift to left;Antalgic;Trunk flexed Gait velocity: decreased Gait velocity interpretation: Below normal speed for age/gender General Gait Details: Continued to minimize weight on LLE due to pain/edema.  Patient with very slow gait speed, and required 3 standing rest breaks due to pain.   Stairs            Wheelchair Mobility    Modified Rankin (Stroke Patients Only)       Balance                                    Cognition Arousal/Alertness: Awake/alert Behavior During Therapy: WFL for tasks assessed/performed Overall Cognitive  Status: Within Functional Limits for tasks assessed                      Exercises General Exercises - Lower Extremity Ankle Circles/Pumps: AROM;Both;20 reps;Seated    General Comments        Pertinent Vitals/Pain Pain Assessment: 0-10 Pain Score: 6  Pain Location: LLE Pain Descriptors / Indicators: Aching;Sore Pain Intervention(s): Limited activity within patient's tolerance;Repositioned    Home Living                      Prior Function            PT Goals (current goals can now be found in the care plan section) Progress towards PT goals: Progressing toward goals    Frequency  Min 3X/week    PT Plan Discharge plan needs to be updated    Co-evaluation             End of Session Equipment Utilized During Treatment: Gait belt Activity Tolerance: Patient limited by pain Patient left: in chair;with call bell/phone within reach     Time: 0940-1005 PT Time Calculation (min) (ACUTE ONLY): 25 min  Charges:  $Gait Training: 23-37 mins                    G Codes:      Despina Pole 06/22/14, 11:29 AM Carita Pian. Rosana Hoes, PT, MBA  Acute Rehab Services Pager [phone]

## 2014-06-12 NOTE — Progress Notes (Addendum)
  Progress Note    06/12/2014 7:37 AM 4 Days Post-Op  Subjective:  Feels about the same as yesterday  Afebrile HR 0000000 NSR [id number] systolic [id number] RA  Filed Vitals:   06/12/14 0537  BP: 133/62  Pulse: 66  Temp: 98.1 F (36.7 C)  Resp: 17    Physical Exam: Cardiac:  regular Lungs:  Non labored Incisions:  C/d/i-still with blisters around the distal incisions; groin incision is c/d/i Extremities:  + doppler signals left DP/PT; left foot is warm  CBC    Component Value Date/Time   WBC 11.5* 06/12/2014 0424   RBC 2.99* 06/12/2014 0424   HGB 8.5* 06/12/2014 0424   HCT 26.7* 06/12/2014 0424   PLT 390 06/12/2014 0424   MCV 89.3 06/12/2014 0424   MCH 28.4 06/12/2014 0424   MCHC 31.8 06/12/2014 0424   RDW 17.2* 06/12/2014 0424   LYMPHSABS 3.4 11/10/2013 0505   MONOABS 2.0* 11/10/2013 0505   EOSABS 0.4 11/10/2013 0505   BASOSABS 0.1 11/10/2013 0505    BMET    Component Value Date/Time   NA 138 06/12/2014 0424   K 3.3* 06/12/2014 0424   CL 101 06/12/2014 0424   CO2 29 06/12/2014 0424   GLUCOSE 89 06/12/2014 0424   BUN 17 06/12/2014 0424   CREATININE 1.56* 06/12/2014 0424   CREATININE 1.52* 05/08/2014 1434   CALCIUM 8.4 06/12/2014 0424   GFRNONAA 45* 06/12/2014 0424   GFRAA 52* 06/12/2014 0424    INR    Component Value Date/Time   INR 1.00 06/03/2014 0953     Intake/Output Summary (Last 24 hours) at 06/12/14 0737 Last data filed at 06/11/14 1850  Gross per 24 hour  Intake    600 ml  Output   1800 ml  Net  -1200 ml     Assessment:  69 y.o. male is s/p:  Left femoral to above-knee popliteal bypass with translocated non-reversed great saphenous   4 Days Post-Op  Plan: -doing well this am-still slow ambulating -OT recommending SNF-pt states they are looking up at [facility].  Pt is too high level for CIR. -DVT prophylaxis:  Lovenox (renal dose) -continue xeroform to blisters and wrap with kerlix -creatinine is stable -acute surgical blood loss  anemia-hgb down slightly, but seems to be leveling off-continue to monitor as pt is tolerating.  -hypokalemia-will give k-dur 20 mEq x 1 dose this am.   Leontine Locket, PA-C [facility] [phone] 06/12/2014 7:37 AM   Less edema vaguely palpable DP pulse Incisions overall healing Continue to ambulate SNF next week but may be good enough by Monday to go home  Ruta Hinds, MD [facility] [place]: [phone] Pager: [phone]

## 2014-06-13 ENCOUNTER — Inpatient Hospital Stay
Admission: RE | Admit: 2014-06-13 | Discharge: 2014-06-23 | Disposition: A | Discharge status: Home / Self Care | Payer: Medicare Other | Source: Ambulatory Visit | Attending: Internal Medicine | Admitting: Internal Medicine

## 2014-06-13 DIAGNOSIS — R600 Localized edema: Secondary | ICD-10-CM | POA: Diagnosis not present

## 2014-06-13 DIAGNOSIS — E1159 Type 2 diabetes mellitus with other circulatory complications: Secondary | ICD-10-CM | POA: Diagnosis not present

## 2014-06-13 DIAGNOSIS — Z9889 Other specified postprocedural states: Secondary | ICD-10-CM | POA: Diagnosis not present

## 2014-06-13 DIAGNOSIS — E785 Hyperlipidemia, unspecified: Secondary | ICD-10-CM | POA: Diagnosis not present

## 2014-06-13 DIAGNOSIS — M6281 Muscle weakness (generalized): Secondary | ICD-10-CM | POA: Diagnosis not present

## 2014-06-13 DIAGNOSIS — I724 Aneurysm of artery of lower extremity: Secondary | ICD-10-CM | POA: Diagnosis not present

## 2014-06-13 DIAGNOSIS — I5022 Chronic systolic (congestive) heart failure: Secondary | ICD-10-CM | POA: Diagnosis not present

## 2014-06-13 DIAGNOSIS — R278 Other lack of coordination: Secondary | ICD-10-CM | POA: Diagnosis not present

## 2014-06-13 DIAGNOSIS — M25562 Pain in left knee: Secondary | ICD-10-CM | POA: Diagnosis not present

## 2014-06-13 DIAGNOSIS — N289 Disorder of kidney and ureter, unspecified: Secondary | ICD-10-CM | POA: Diagnosis not present

## 2014-06-13 DIAGNOSIS — E119 Type 2 diabetes mellitus without complications: Secondary | ICD-10-CM | POA: Diagnosis not present

## 2014-06-13 DIAGNOSIS — R262 Difficulty in walking, not elsewhere classified: Secondary | ICD-10-CM | POA: Diagnosis not present

## 2014-06-13 DIAGNOSIS — Z48812 Encounter for surgical aftercare following surgery on the circulatory system: Secondary | ICD-10-CM | POA: Diagnosis not present

## 2014-06-13 DIAGNOSIS — N189 Chronic kidney disease, unspecified: Secondary | ICD-10-CM | POA: Diagnosis not present

## 2014-06-13 DIAGNOSIS — I70209 Unspecified atherosclerosis of native arteries of extremities, unspecified extremity: Secondary | ICD-10-CM | POA: Diagnosis not present

## 2014-06-13 DIAGNOSIS — E1149 Type 2 diabetes mellitus with other diabetic neurological complication: Secondary | ICD-10-CM | POA: Diagnosis not present

## 2014-06-13 DIAGNOSIS — I10 Essential (primary) hypertension: Secondary | ICD-10-CM | POA: Diagnosis not present

## 2014-06-13 LAB — CBC
HEMATOCRIT: 27.3 % — AB (ref 39.0–52.0)
HEMOGLOBIN: 8.5 g/dL — AB (ref 13.0–17.0)
MCH: 27.3 pg (ref 26.0–34.0)
MCHC: 31.1 g/dL (ref 30.0–36.0)
MCV: 87.8 fL (ref 78.0–100.0)
Platelets: 449 10*3/uL — ABNORMAL HIGH (ref 150–400)
RBC: 3.11 MIL/uL — AB (ref 4.22–5.81)
RDW: 17.4 % — ABNORMAL HIGH (ref 11.5–15.5)
WBC: 9.9 10*3/uL (ref 4.0–10.5)

## 2014-06-13 LAB — BASIC METABOLIC PANEL
ANION GAP: 8 (ref 5–15)
BUN: 19 mg/dL (ref 6–23)
CO2: 29 mmol/L (ref 19–32)
Calcium: 8.4 mg/dL (ref 8.4–10.5)
Chloride: 101 mmol/L (ref 96–112)
Creatinine, Ser: 1.61 mg/dL — ABNORMAL HIGH (ref 0.50–1.35)
GFR calc Af Amer: 50 mL/min — ABNORMAL LOW (ref >90)
GFR calc non Af Amer: 43 mL/min — ABNORMAL LOW (ref >90)
Glucose, Bld: 96 mg/dL (ref 70–99)
Potassium: 3.7 mmol/L (ref 3.5–5.1)
Sodium: 138 mmol/L (ref 135–145)

## 2014-06-13 LAB — GLUCOSE, CAPILLARY
GLUCOSE-CAPILLARY: 129 mg/dL — AB (ref 70–99)
Glucose-Capillary: 104 mg/dL — ABNORMAL HIGH (ref 70–99)
Glucose-Capillary: 109 mg/dL — ABNORMAL HIGH (ref 70–99)

## 2014-06-13 MED ORDER — OXYCODONE-ACETAMINOPHEN 5-325 MG PO TABS: 1.0000 | ORAL_TABLET | ORAL | Status: DC | PRN

## 2014-06-13 NOTE — Progress Notes (Signed)
06/13/2014 6:16 PM  Orders to d/c to [facility].  D/C IV and TELE.  Questions and concerns addressed.  Pt. D/c'd per Corey Harold

## 2014-06-13 NOTE — Progress Notes (Addendum)
  Progress Note    06/13/2014 8:05 AM 5 Days Post-Op  Subjective:  More pain today, but walked in the halls anyway, just not as far  Afebrile HR 60's NSR 0000000 systolic [id number] RA  Filed Vitals:   06/13/14 0359  BP: 142/68  Pulse: 67  Temp: 98.6 F (37 C)  Resp: 12    Physical Exam: Cardiac:  regular Lungs:  Non labored Incisions:  Still with blisters along the saphenous vein harvest sites-not worsening Extremities:  Biphasic left DP/PT doppler signals.  A little more edema in his left foot today.  Swelling in his left thigh appears a little improved today   CBC    Component Value Date/Time   WBC 9.9 06/13/2014 0359   RBC 3.11* 06/13/2014 0359   HGB 8.5* 06/13/2014 0359   HCT 27.3* 06/13/2014 0359   PLT 449* 06/13/2014 0359   MCV 87.8 06/13/2014 0359   MCH 27.3 06/13/2014 0359   MCHC 31.1 06/13/2014 0359   RDW 17.4* 06/13/2014 0359   LYMPHSABS 3.4 11/10/2013 0505   MONOABS 2.0* 11/10/2013 0505   EOSABS 0.4 11/10/2013 0505   BASOSABS 0.1 11/10/2013 0505    BMET    Component Value Date/Time   NA 138 06/13/2014 0359   K 3.7 06/13/2014 0359   CL 101 06/13/2014 0359   CO2 29 06/13/2014 0359   GLUCOSE 96 06/13/2014 0359   BUN 19 06/13/2014 0359   CREATININE 1.61* 06/13/2014 0359   CREATININE 1.52* 05/08/2014 1434   CALCIUM 8.4 06/13/2014 0359   GFRNONAA 43* 06/13/2014 0359   GFRAA 50* 06/13/2014 0359    INR    Component Value Date/Time   INR 1.00 06/03/2014 0953     Intake/Output Summary (Last 24 hours) at 06/13/14 0805 Last data filed at 06/13/14 0429  Gross per 24 hour  Intake    240 ml  Output   1325 ml  Net  -1085 ml     Assessment:  69 y.o. male is s/p:  Left femoral to above-knee popliteal bypass with translocated non-reversed great saphenous  5 Days Post-Op  Plan: -pt doing well today, but having a little more pain in his whole leg today. -he does have swelling in his left leg-encouraged him to elevate his leg when not  ambulating -DVT prophylaxis:  Lovenox (renal dose) -Creatinine 1.61 today (was 1.59 two days ago).  He has not been receiving Metformin in the hospital, but is on this at home.  Will d/c this as his creatinine is greater than 1.5.  Will have him f/u with his medical MD in the next week to re-evaluate his medications and check his renal function. Discussed this with the pt and he expressed understanding -acute surgical blood loss anemia is stable -pain issues-encouraged the pt to take pain medication on a scheduled basis for the next couple of days and then taper.  He knows that if he has acute changes in his left foot, we need to know about this immediately.   Leontine Locket, PA-C [facility] [phone] 06/13/2014 8:05 AM   Addendum  I have independently interviewed and examined the patient, and I agree with the physician assistant's findings.  Ok to transfer to SNF.  Wound care instructions given in regards to his blistering.  He is ALLERGIC TO TAPE as evident by this reaction to the steristrips.  Adele Barthel, MD [facility] [place] Office: [phone] Pager: [phone]  06/13/2014, 9:35 AM

## 2014-06-13 NOTE — Discharge Summary (Signed)
Discharge Summary     Jamal Hassinger Totten 04/30/1946 69 y.o. male  [id number]  Admission Date: 06/08/2014  Discharge Date: 06/13/14  Physician: Rosetta Posner, MD  Admission Diagnosis: Left popliteal artery aneurysm I72.4   HPI:   This is a 69 y.o. male presents today for kidney follow-up of his left popliteal artery aneurysm. We have followed this over the course of several years. Most recently his Doppler duplex in May 2015 revealed a 2.8 cm distal superficial femoral artery aneurysm. Today he went for a CT angiogram for further evaluation. This was of his abdomen pelvis and runoff. 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 He has followed up with Dr. Sammuel Hines since then.  He reports a bilateral hip pain which appears to be more arthritic or potentially degenerative disc disease then claudication. He does have right calf claudication with known occlusion of his superficial artery which is chronic.  Hospital Course:  The patient was admitted to the hospital and taken to the operating room on 06/08/2014 and underwent: Left femoral to above-knee popliteal bypass with translocated non-reversed great saphenous vein    The pt tolerated the procedure well and was transported to the PACU in good condition.   By POD 1, he was doing well.  He did have tape blisters where the steri strips were in place over the saphenous vein harvest sites.  The steri strips were removed and this was dressed with xeroform and wrapped with kerlix to prevent more tape blisters.  He did have some swelling in his thigh, but this has improved over his postoperative course.    He did have acute surgical blood loss anemia, but this is stable and he has been tolerating.  He also had hypokalemia with K+ of 3.3.  He was given 20 mEq of K-dur and this improved 3.7.  The pt did have a creatinine of 1.61 at discharge, however, this is  close to his baseline.  His metformin has been discontinued and he will f/u with his PCP for recheck of labs next week and evaluation of his DM medications.   Post operative ABI's are as follows:   RIGHT    LEFT    PRESSURE WAVEFORM  PRESSURE WAVEFORM  BRACHIAL 128 Triphasic BRACHIAL 121 Triphasic  DP 76 Monophasic DP 80 Monophasic  PT 80 Monophasic PT 81 Monophasic    RIGHT LEFT  ABI 0.63 0.63    The remainder of the hospital course consisted of increasing mobilization and increasing intake of solids without difficulty.  CBC    Component Value Date/Time   WBC 9.9 06/13/2014 0359   RBC 3.11* 06/13/2014 0359   HGB 8.5* 06/13/2014 0359   HCT 27.3* 06/13/2014 0359   PLT 449* 06/13/2014 0359   MCV 87.8 06/13/2014 0359   MCH 27.3 06/13/2014 0359   MCHC 31.1 06/13/2014 0359   RDW 17.4* 06/13/2014 0359   LYMPHSABS 3.4 11/10/2013 0505   MONOABS 2.0* 11/10/2013 0505   EOSABS 0.4 11/10/2013 0505   BASOSABS 0.1 11/10/2013 0505    BMET    Component Value Date/Time   NA 138 06/13/2014 0359   K 3.7 06/13/2014 0359   CL 101 06/13/2014 0359   CO2 29 06/13/2014 0359   GLUCOSE 96 06/13/2014 0359   BUN 19 06/13/2014 0359   CREATININE 1.61* 06/13/2014 0359   CREATININE 1.52* 05/08/2014 1434   CALCIUM 8.4 06/13/2014 [place]  43* 06/13/2014 0359   GFRAA 50* 06/13/2014 0359     Discharge Instructions:   The patient is discharged with extensive instructions on wound care and progressive ambulation.  They are instructed not to drive or perform any heavy lifting until returning to see the physician in his office.      Discharge Instructions    Call MD for:  redness, tenderness, or signs of infection (pain, swelling, bleeding, redness, odor or green/yellow discharge around incision site)    Complete by:  As directed      Call MD for:  severe or increased pain, loss or decreased feeling  in affected limb(s)    Complete by:  As directed       Call MD for:  temperature >100.5    Complete by:  As directed      Discharge wound care:    Complete by:  As directed   Wash the groin wound with soap and water daily and pat dry. (No tub bath-only shower)  Then put a dry gauze or washcloth there to keep this area dry daily and as needed.  Do not use Vaseline or neosporin on your incisions.  Only use soap and water on your incisions and then protect and keep dry. Place Xeroform over blistered areas and wrap with kerlix daily to hold xeroform in place.     Driving Restrictions    Complete by:  As directed   No driving for 2 weeks     Lifting restrictions    Complete by:  As directed   No lifting for 4 weeks     Resume previous diet    Complete by:  As directed            Discharge Diagnosis:  Left popliteal artery aneurysm I72.4  Secondary Diagnosis: Patient Active Problem List   Diagnosis Date Noted  . Aneurysm of left femoral artery 06/08/2014  . Popliteal artery aneurysm 09/23/2013  . Preoperative cardiovascular examination 08/29/2013  . Abdominal aneurysm without mention of rupture 03/05/2012  . Ischemic cardiomyopathy   . Carotid artery disease   . Coronary atherosclerosis of native coronary artery 11/26/2009  . ATHEROSLERO NATV ART EXTREM W/INTERMIT CLAUDICAT 09/23/2009  . Aneurysm of artery of lower extremity 05/25/2009  . DIABETIC PERIPHERAL NEUROPATHY 03/30/2009  . DYSLIPIDEMIA 02/22/2009  . Essential hypertension, benign 02/22/2009  . Chronic systolic heart failure [id number]   Past Medical History  Diagnosis Date  . Ischemic cardiomyopathy     LVEF 45-50% 11/2011  . Coronary atherosclerosis of native coronary artery     Multivessel status post CABG  . Angioedema     Secondary to ACE inhibitor  . Bell palsy   . Type 2 diabetes mellitus   . AAA (abdominal aortic aneurysm)     Followed by Dr. Donnetta Hutching  . Essential hypertension, benign   . PAD (peripheral artery disease)     Followed by Dr. Donnetta Hutching  . Sleep  apnea   . Arthritis   . Carotid artery disease   . Atrial fibrillation   . Myocardial infarction 2001  . Nephrolithiasis   . Cough     coughing up phlegm  . Hernia of abdominal wall   . Blood dyscrasia     bleed freely (due to Aspirin per patient; no known bleeding disorder)           Medication List    STOP taking these medications        metFORMIN 1000 MG tablet  Commonly known  as:  GLUCOPHAGE      TAKE these medications        acetaminophen 500 MG tablet  Commonly known as:  TYLENOL  Take 1,000 mg by mouth 2 (two) times daily as needed for headache (pain).     allopurinol 100 MG tablet  Commonly known as:  ZYLOPRIM  Take 100 mg by mouth daily with supper.     aspirin EC 81 MG tablet  Take 81 mg by mouth daily.     carvedilol 25 MG tablet  Commonly known as:  COREG  Take 12.5 mg by mouth 2 (two) times daily with a meal.     furosemide 40 MG tablet  Commonly known as:  LASIX  Take 40 mg by mouth 2 (two) times daily.     gabapentin 300 MG capsule  Commonly known as:  NEURONTIN  Take 300 mg by mouth 2 (two) times daily.     glipiZIDE 10 MG 24 hr tablet  Commonly known as:  GLUCOTROL XL  Take 10 mg by mouth 2 (two) times daily.     isosorbide mononitrate 60 MG 24 hr tablet  Commonly known as:  IMDUR  Take 2 tablets (120 mg total) by mouth every morning. & 30 mg in the evening     linagliptin 5 MG Tabs tablet  Commonly known as:  TRADJENTA  Take 5 mg by mouth daily.     nitroGLYCERIN 0.4 MG SL tablet  Commonly known as:  NITROSTAT  Place 0.4 mg under the tongue every 5 (five) minutes as needed for chest pain.     oxyCODONE-acetaminophen 5-325 MG per tablet  Commonly known as:  PERCOCET/ROXICET  Take 1-2 tablets by mouth every 4 (four) hours as needed for moderate pain.     simvastatin 40 MG tablet  Commonly known as:  ZOCOR  Take 1 tablet (40 mg total) by mouth at bedtime.        Percocet #30 No Refill  Disposition: SNF  Patient's condition:  is Good  Follow up: 1. Dr. Donnetta Hutching in 2 weeks   Leontine Locket, PA-C [facility] [phone] 06/13/2014  8:45 AM  - For VQI Registry use --- Instructions: Press F2 to tab through selections.  Delete question if not applicable.   Post-op:  Wound infection: No  Graft infection: No  Transfusion: No  If yes, n/a units given New Arrhythmia: No Ipsilateral amputation: No, [ ]  Minor, [ ]  BKA, [ ]  AKA Discharge patency: [x ] Primary, [ ]  Primary assisted, [ ]  Secondary, [ ]  Occluded Patency judged by: [ ]  Dopper only, [ ]  Palpable graft pulse, [ ]  Palpable distal pulse, [ ]  ABI inc. > 0.15, [ ]  Duplex Discharge ABI: R 0.63, L 0.63 Discharge TBI: R , L  D/C Ambulatory Status: Ambulatory with Assistance  Complications: MI: No, [ ]  Troponin only, [ ]  EKG or Clinical CHF: No Resp failure:No, [ ]  Pneumonia, [ ]  Ventilator Chg in renal function: No, [ ]  Inc. Cr > 0.5, [ ]  Temp. Dialysis, [ ]  Permanent dialysis Stroke: No, [ ]  Minor, [ ]  Major Return to OR: No  Reason for return to OR: [ ]  Bleeding, [ ]  Infection, [ ]  Thrombosis, [ ]  Revision  Discharge medications: Statin use:  yes ASA use:  yes Plavix use:  no Beta blocker use: yes Coumadin use: no

## 2014-06-13 NOTE — Discharge Instructions (Signed)
Keep left leg elevated as much as possible when not ambulating.

## 2014-06-14 NOTE — Progress Notes (Signed)
CARE MANAGEMENT NOTE 06/14/2014  Patient:  [name], [name]   Account Number:  [id number]  Date Initiated:  06/10/2014  Documentation initiated by:  Marvetta Gibbons  Subjective/Objective Assessment:   Pt admitted s/p popliteal bypass graft     Action/Plan:   PTA pt lived at home alone, PT eval   Anticipated DC Date:  06/11/2014   Anticipated DC Plan:  SKILLED NURSING FACILITY  In-house referral  Clinical Social Worker      DC Forensic scientist  CM consult      [place]   Choice offered to / List presented to:  C-1 Patient   DME arranged  3-N-1  Vassie Moselle      DME agency  [name] arranged  [place].   Status of service:  Completed, signed off Medicare Important Message given?  YES (If response is "NO", the following Medicare IM given date fields will be blank) Date Medicare IM given:  06/11/2014 Medicare IM given by:  Marvetta Gibbons Date Additional Medicare IM given:   Additional Medicare IM given by:    Discharge Disposition:  [place]  Per UR Regulation:  Reviewed for med. necessity/level of care/duration of stay  If discussed at [place] of Stay Meetings, dates discussed:    Comments:  06/12/14- 1400- Marvetta Gibbons RN, BSN [phone] Pt has decided on SNF as he does not have assistance at home -CSW has been following for bed search-Plan is to d/c pt to SNF in am- pt has bed per CSW-  06/10/14- 1200- Marvetta Gibbons RN BSN [phone] PT recommending [facility]- spoke with pt at bedside- pt concerned about going home alone- has children/family but has not spoken with them to see if they can assist or stay with him for first couple of days at home- encouraged pt to speak with family about this- discussed [place] and STSNF options- list for [facility] agencies provided- if pt returns home per pt choice will use [facility] for [facility] services- will need RW and  3n1- orders have been placed- have asked CSW to come by to speak with pt regarding STSNF option - will f/u with pt on d/c plans home with [facility] vs STSNF.

## 2014-06-14 NOTE — Social Work (Signed)
CSW completed patient's discharge to SNF. RN will call [facility] for patient transport.  Christene Lye MSW, [place]

## 2014-06-15 ENCOUNTER — Other Ambulatory Visit: Payer: Self-pay | Admitting: *Deleted

## 2014-06-15 ENCOUNTER — Ambulatory Visit (HOSPITAL_COMMUNITY)
Admission: RE | Admit: 2014-06-15 | Discharge: 2014-06-15 | Disposition: A | Discharge status: Home / Self Care | Payer: No Typology Code available for payment source | Source: Ambulatory Visit | Attending: Internal Medicine | Admitting: Internal Medicine

## 2014-06-15 ENCOUNTER — Other Ambulatory Visit: Payer: Self-pay | Admitting: Internal Medicine

## 2014-06-15 ENCOUNTER — Non-Acute Institutional Stay (SKILLED_NURSING_FACILITY): Payer: Medicare Other | Admitting: Internal Medicine

## 2014-06-15 DIAGNOSIS — R600 Localized edema: Secondary | ICD-10-CM

## 2014-06-15 DIAGNOSIS — N189 Chronic kidney disease, unspecified: Secondary | ICD-10-CM

## 2014-06-15 DIAGNOSIS — I70209 Unspecified atherosclerosis of native arteries of extremities, unspecified extremity: Secondary | ICD-10-CM

## 2014-06-15 DIAGNOSIS — E1159 Type 2 diabetes mellitus with other circulatory complications: Secondary | ICD-10-CM | POA: Diagnosis not present

## 2014-06-15 DIAGNOSIS — R609 Edema, unspecified: Secondary | ICD-10-CM

## 2014-06-15 DIAGNOSIS — E1151 Type 2 diabetes mellitus with diabetic peripheral angiopathy without gangrene: Secondary | ICD-10-CM

## 2014-06-15 MED ORDER — OXYCODONE-ACETAMINOPHEN 5-325 MG PO TABS: 1.0000 | ORAL_TABLET | ORAL | Status: DC | PRN

## 2014-06-15 NOTE — Telephone Encounter (Signed)
[facility] 

## 2014-06-16 ENCOUNTER — Telehealth: Payer: Self-pay | Admitting: Vascular Surgery

## 2014-06-16 LAB — GLUCOSE, CAPILLARY
GLUCOSE-CAPILLARY: 142 mg/dL — AB (ref 70–99)
GLUCOSE-CAPILLARY: 114 mg/dL — AB (ref 70–99)
Glucose-Capillary: 128 mg/dL — ABNORMAL HIGH (ref 70–99)
Glucose-Capillary: 127 mg/dL — ABNORMAL HIGH (ref 70–99)

## 2014-06-16 NOTE — Telephone Encounter (Addendum)
-----   Message from Mena Goes, RN sent at 06/14/2014  3:55 PM EST ----- Regarding: Schedule   ----- Message -----    From: Gabriel Earing, PA-C    Sent: 06/13/2014   6:21 AM      To: [facility] Charge Pool  S/p Left femoral to above-knee popliteal bypass with translocated non-reversed great saphenous.  F/u with Dr. Donnetta Hutching in 2 weeks.  Thanks   06/16/14: spoke with pt, dpm

## 2014-06-16 NOTE — Progress Notes (Addendum)
Patient ID: [name], male   DOB: [birth year], 69 y.o.   MRN: [id number]               HISTORY & PHYSICAL  DATE:  06/15/2014                FACILITY: [facility]              LEVEL OF CARE:   SNF   CHIEF COMPLAINT:  Admission to SNF, post stay at [facility], 06/08/2014 through 06/13/2014.      HISTORY OF PRESENT ILLNESS:  This is a patient who I believe was admitted electively for surgical repair of a left popliteal artery aneurysm.  He was taken to the operating room for a left femoral to an above-knee popliteal bypass with a translocated nonreversed saphenous vein.    He developed tape blisters where the Steri-Strips were in place over the saphenous vein harvest sites.  He is listed as having some swelling in his thigh, but this improved over the postoperative course.    He had mild hypokalemia with a potassium of 3.3 (on Lasix).    His creatinine was 1.61 at discharge, which is close to his baseline.    Postoperative ABIs showed an ABI on the right of 0.63, on the left of 0.63.    PAST MEDICAL HISTORY/PROBLEM LIST:                             Ischemic cardiomyopathy with a previous CABG.  However, in 2015, his ejection fraction was felt to be 45-50%.  Right-sided function was good.    Status post CABG.    History of angioedema secondary to ACE inhibitors.    History of Bell's palsy.    Type 2 diabetes.    Abdominal aortic aneurysm.  Followed by [facility].    Essential hypertension.    Known PAD.    Sleep apnea.    Carotid artery disease.    History of atrial fibrillation.    History of an MI in 2001.    History of nephrolithiasis.    Periumbilical hernia.  Followed clinically.    PAST SURGICAL HISTORY:                   Cataract extractions.    Hand surgery.    Abdominal aortic aneurysm repair in July 2015.    Carotid artery bypass graft in 2001.    Bypass of the left femoral to above-knee popliteal on 06/08/2014.       CURRENT MEDICATIONS:  Medication list is reviewed.       Allopurinol 100 q.d.    ASA 81 q.d.      Carvedilol 12.5 b.i.d.     Lasix 40 b.i.d.     Neurontin 300 two times a day.    Glipizide XL 10 mg two times daily.    Imdur 60 mg, 2 tablets/120 mg daily and 30 mg in the evening.    Tradjenta 5 mg daily.    Nitroglycerin 0.4 p.r.n.    Percocet 5/325, 1-2 tablets every 4 hours as needed.    Zocor 40 mg daily.    SOCIAL HISTORY:     HOUSING:  The patient tells me he lives in [place].   TOBACCO USE:  He quit smoking in 2009.                 FUNCTIONAL STATUS:  Independent  with ADLs and IADLs.   States his blood sugars have been well controlled on oral agents, although I note his metformin has been discontinued.      FAMILY HISTORY:             MOTHER:  Mother was a diabetic.  Deceased.  His mother died at age 77.   FATHER:  Father had heart disease and hyperlipidemia.  Deceased.    REVIEW OF SYSTEMS:   CHEST/RESPIRATORY:  No cough.  No shortness of breath.    CARDIAC:   Denies any recent chest pain.   GI:  No abdominal pain.  Bowels are moving normally.   GU:  No dysuria.   EDEMA/VARICOSITIES:  States he had left leg swelling, but it is better after the surgery.     PHYSICAL EXAMINATION:           GENERAL APPEARANCE:  The patient looks to be in no distress.   CHEST/RESPIRATORY:  Clear air entry bilaterally.   CARDIOVASCULAR:  CARDIAC:  Heart sounds sound regular.  There are no murmurs.  No gallops.   GASTROINTESTINAL:  ABDOMEN:   Distended.  Bowel sounds are positive.   LIVER/SPLEEN/KIDNEYS:  No liver, no spleen.   HERNIA:  He has a periumbilical hernia just superior to the umbilicus.  This does not reduce easily, although the patient does not report any recent problems.    GENITOURINARY:  BLADDER:   Not distended.   CIRCULATION:  EDEMA/VARICOSITIES:  Extremities:  Left leg is really quite swollen, even below the knee.  There is tenderness in the calf.  I think he  will need a duplex ultrasound to rule out a DVT.   SKIN:  INSPECTION:  The incision looks to be well healed.  However, he has blisters on either side of this, looking like this is from [place].  There is no evidence of an infection.   MUSCULOSKELETAL:   EXTREMITIES:    LEFT LOWER EXTREMITY:  He has had a left great toe amputation remotely.  This was from gangrene when he was young after a crush injury.   VASCULAR:   ARTERIAL:  His peripheral pulses are, however, difficult to feel.    ASSESSMENT/PLAN:            Status post left femoral to above-knee popliteal bypass surgery.   His surgical incision looks quite good.  He has blisters on his leg which I think were probably almost like where you would expect Steri-Strips.  These do not look particularly cosmetically pleasing, but I do not think they are a major clinical concern.    Left leg swelling.  While some of this could be blood loss into his leg at the time of surgery, I think we will need to rule out a DVT in this clinical situation.  A venous duplex ultrasound will be ordered.    Hypokalemia.  Corrected in the hospital.  His current potassium today is 4.    Hemoglobin of 8.3.  This was normal when he came into the hospital.  Presumably, this represents blood loss issues.    Chronic renal insufficiency.  His creatinine today is 1.67.    Type 2 diabetes with PAD and nephropathy.  We will monitor his CBGs on oral agents.  I agree with stopping the metformin.

## 2014-06-17 LAB — GLUCOSE, CAPILLARY
GLUCOSE-CAPILLARY: 139 mg/dL — AB (ref 70–99)
Glucose-Capillary: 85 mg/dL (ref 70–99)
Glucose-Capillary: 89 mg/dL (ref 70–99)
Glucose-Capillary: 147 mg/dL — ABNORMAL HIGH (ref 70–99)

## 2014-06-18 LAB — GLUCOSE, CAPILLARY
GLUCOSE-CAPILLARY: 81 mg/dL (ref 70–99)
Glucose-Capillary: 155 mg/dL — ABNORMAL HIGH (ref 70–99)
Glucose-Capillary: 116 mg/dL — ABNORMAL HIGH (ref 70–99)
Glucose-Capillary: 163 mg/dL — ABNORMAL HIGH (ref 70–99)

## 2014-06-19 ENCOUNTER — Non-Acute Institutional Stay (SKILLED_NURSING_FACILITY): Payer: Medicare Other | Admitting: Internal Medicine

## 2014-06-19 DIAGNOSIS — I5022 Chronic systolic (congestive) heart failure: Secondary | ICD-10-CM

## 2014-06-19 DIAGNOSIS — E119 Type 2 diabetes mellitus without complications: Secondary | ICD-10-CM

## 2014-06-19 DIAGNOSIS — I724 Aneurysm of artery of lower extremity: Secondary | ICD-10-CM | POA: Diagnosis not present

## 2014-06-19 DIAGNOSIS — N289 Disorder of kidney and ureter, unspecified: Secondary | ICD-10-CM

## 2014-06-19 LAB — GLUCOSE, CAPILLARY
Glucose-Capillary: 82 mg/dL (ref 70–99)
Glucose-Capillary: 121 mg/dL — ABNORMAL HIGH (ref 70–99)
Glucose-Capillary: 76 mg/dL (ref 70–99)
Glucose-Capillary: 151 mg/dL — ABNORMAL HIGH (ref 70–99)

## 2014-06-19 NOTE — Progress Notes (Signed)
Patient ID: [name], male   DOB: 10/07/1945, 69 y.o.   MRN: [id number]   this is a discharge note.  Level of care skilled.  Facility [facility]    CHIEF COMPLAINT:  Discharge note.      HISTORY OF PRESENT ILLNESS:  This is a patient who  was admitted electively for surgical repair of a left popliteal artery aneurysm.  He was taken to the operating room for a left femoral to an above-knee popliteal bypass with a translocated nonreversed saphenous vein.    He developed tape blisters where the Steri-Strips were in place over the saphenous vein harvest sites--per wound care nurse this has improved significantly.    He had mild hypokalemia with a potassium of 3.3 (on Lasix).  --This apparently was supplemented and last potassium was 4.0 on January 25  His creatinine was 1.61 at discharge, which is close to his baseline most recent creatinine is 1.67 on January 25.    Postoperative ABIs showed an ABI on the right of 0.63, on the left of 0.63.   he is a type II diabetic is on Glucotrol XL 10 mg twice a day as well as Tradjenta 5 mg every morning-I note his sugars are last couple mornings have been in the low 80s generally run in the lower 100s-will reduce his glipizide to 5 mg twice a day  In an attempt to avoid hypokalemia--    Patient will be going home he lives alone appears to be doing well however-he will need outpatient therapy-his pain is controlled with Percocet as needed.    PAST MEDICAL HISTORY/PROBLEM LIST:                             Ischemic cardiomyopathy with a previous CABG.  However, in 2015, his ejection fraction was felt to be 45-50%.  Right-sided function was good.    Status post CABG.    History of angioedema secondary to ACE inhibitors.    History of Bell's palsy.    Type 2 diabetes.    Abdominal aortic aneurysm.  Followed [facility].    Essential hypertension.    Known PAD.    Sleep apnea.    Carotid artery disease.    History of  atrial fibrillation.    History of an MI in 2001.    History of nephrolithiasis.    Periumbilical hernia.  Followed clinically.    PAST SURGICAL HISTORY:                   Cataract extractions.    Hand surgery.    Abdominal aortic aneurysm repair in July 2015.    Carotid artery bypass graft in 2001.    Bypass of the left femoral to above-knee popliteal on 06/08/2014.      CURRENT MEDICATIONS:  Medication list is reviewed.       Allopurinol 100 q.d.    ASA 81 q.d.      Carvedilol 12.5 b.i.d.     Lasix 40 b.i.d.     Neurontin 300 two times a day.    Glipizide XL 10 mg two times daily.    Imdur 60 mg, 2 tablets/120 mg daily and 30 mg in the evening.    Tradjenta 5 mg daily.    Nitroglycerin 0.4 p.r.n.    Percocet 5/325, 1-2 tablets every 4 hours as needed.    Zocor 40 mg daily.    SOCIAL HISTORY:  HOUSING:  The patient  lives in [place].   TOBACCO USE:  He quit smoking in 2009.                 FUNCTIONAL STATUS:  Independent with ADLs and IADLs.   States his blood sugars have been well controlled on oral agents, although I note his metformin has been discontinued.      FAMILY HISTORY:             MOTHER:  Mother was a diabetic.  Deceased.  His mother died at age 17.   FATHER:  Father had heart disease and hyperlipidemia.  Deceased.    REVIEW OF SYSTEMS Gen. no complaints of fever or chills.  Skin-blisters on leg apparently are resolving unremarkably per wound care:   CHEST/RESPIRATORY:  No cough.  No shortness of breath.    CARDIAC:   Denies any recent chest pain.   GI:  No abdominal pain.  Bowels are moving normally.   GU:  No dysuria.   EDEMA/VARICOSITIES:  States he had left leg swelling, but it is better after the surgery Neurologic does not complain of dizziness or headache.     PHYSICAL EXAMINATION: 97.8 pulse 64 respirations 20 blood pressures appear to run 123XX123 systolically usually [id number] diastolically-most recently 153/75           GENERAL  APPEARANCE:  The patient looks to be in no distress. Given is warm and dry there are patches over the blisters on his left upper leg there is no sign of infection from I can tell and per nursing this is stable and resolving unremarkably   CHEST/RESPIRATORY:  Clear air entry bilaterally.   CARDIOVASCULAR:  CARDIAC:  Heart sounds sound regular.  There are no murmurs.  No gallops.   GASTROINTESTINAL:  ABDOMEN:   Distended.  Bowel sounds are positive.   LIVER/SPLEEN/KIDNEYS:  No liver, no spleen.   HERNIA:  He has a periumbilical hernia just superior to the umbilicus.  This does not reduce easily, although the patient does not report any recent problems.    GENITOURINARY:  BLADDER:   Not distended.   CIRCULATION:  EDEMA/VARICOSITIES:  Extremities:  Left leg edema has improved-is ambulatory .    Marland Kitchen   MUSCULOSKELETAL:   EXTREMITIES:    LEFT LOWER EXTREMITY:  He has had a left great toe amputation remotely.  This was from gangrene when he was young after a crush injury. Is ambulatory   VASCULAR:   ARTERIAL:  His peripheral pulses are, however, difficult to feel.   Labs.  06/15/2014.  WBC 10.8 hemoglobin 8.3 platelets 507.  Sodium 141 potassium 4 BUN 21 creatinine 1.67   ASSESSMENT/PLAN:            Status post left femoral to above-knee popliteal bypass surgery.   Patient appears to have done well with this will need outpatient therapy for strengthening-blisters on his left upper leg apparently are resolving unremarkably according to wound care.    Left leg swelling.   This has improved venous Doppler was negative for DVT    Hemoglobin of 8.3.  This was normal when he came into the hospital.  Presumably, this represents blood loss issues.--We will update before discharge    Chronic renal insufficiency.  His creatinine appears to run around 1.6-will update this    Type 2 diabetes with PAD and nephropathy.  Again will reduce his glipizide to 5 mg twice a day secondary to some  readings in the 80s-patient does report  at home sometimes he will feel sugar is low and eats a snack-would like to avoid hypoglycemia if at all possible--he continues on Tradjenta as well.  History CHF-this appears to be stable he is on Lasix again will update a metabolic panel continues on a beta blocker as well as a statin    [id number]-of note greater than 30 minutes spent on this discharge summary

## 2014-06-20 LAB — GLUCOSE, CAPILLARY
GLUCOSE-CAPILLARY: 84 mg/dL (ref 70–99)
GLUCOSE-CAPILLARY: 141 mg/dL — AB (ref 70–99)
Glucose-Capillary: 84 mg/dL (ref 70–99)
Glucose-Capillary: 116 mg/dL — ABNORMAL HIGH (ref 70–99)

## 2014-06-21 LAB — GLUCOSE, CAPILLARY
GLUCOSE-CAPILLARY: 136 mg/dL — AB (ref 70–99)
Glucose-Capillary: 107 mg/dL — ABNORMAL HIGH (ref 70–99)
Glucose-Capillary: 173 mg/dL — ABNORMAL HIGH (ref 70–99)

## 2014-06-22 DIAGNOSIS — E119 Type 2 diabetes mellitus without complications: Secondary | ICD-10-CM | POA: Insufficient documentation

## 2014-06-22 LAB — GLUCOSE, CAPILLARY
GLUCOSE-CAPILLARY: 135 mg/dL — AB (ref 70–99)
Glucose-Capillary: 63 mg/dL — ABNORMAL LOW (ref 70–99)
Glucose-Capillary: 98 mg/dL (ref 70–99)
Glucose-Capillary: 112 mg/dL — ABNORMAL HIGH (ref 70–99)
Glucose-Capillary: 132 mg/dL — ABNORMAL HIGH (ref 70–99)

## 2014-06-23 LAB — GLUCOSE, CAPILLARY
GLUCOSE-CAPILLARY: 107 mg/dL — AB (ref 70–99)
Glucose-Capillary: 113 mg/dL — ABNORMAL HIGH (ref 70–99)

## 2014-06-25 DIAGNOSIS — M79605 Pain in left leg: Secondary | ICD-10-CM | POA: Diagnosis not present

## 2014-06-25 DIAGNOSIS — R262 Difficulty in walking, not elsewhere classified: Secondary | ICD-10-CM | POA: Diagnosis not present

## 2014-06-25 DIAGNOSIS — Z9889 Other specified postprocedural states: Secondary | ICD-10-CM | POA: Diagnosis not present

## 2014-06-29 ENCOUNTER — Encounter: Payer: Self-pay | Admitting: Vascular Surgery

## 2014-06-30 ENCOUNTER — Ambulatory Visit (INDEPENDENT_AMBULATORY_CARE_PROVIDER_SITE_OTHER): Payer: Self-pay | Admitting: Vascular Surgery

## 2014-06-30 ENCOUNTER — Encounter: Payer: Self-pay | Admitting: Vascular Surgery

## 2014-06-30 VITALS — BP 142/64 | HR 64 | Temp 98.0°F | Ht 73.0 in | Wt 234.0 lb

## 2014-06-30 DIAGNOSIS — I724 Aneurysm of artery of lower extremity: Secondary | ICD-10-CM

## 2014-06-30 DIAGNOSIS — Z48812 Encounter for surgical aftercare following surgery on the circulatory system: Secondary | ICD-10-CM

## 2014-06-30 NOTE — Progress Notes (Signed)
Here today for follow-up of repair of his distal superficial artery aneurysm left leg on 128. He had reversed great saphenous vein interposition. He did well. He continues to have moderate discomfort and does have significant swelling associated with the surgery. He reports that he has minimal swelling when he initially rises and throughout the day has more swelling. He does have some discomfort associated with this as well.  Physical exam. His incisions are all healing quite nicely. He did have some irritation from the Steri-Strips on his incisions. Groin and other incisions are well-healed. He does have palpable popliteal pulse.  Impression and plan: Stable follow-up after surgery. He will continue his mobilization. I explained that the swelling is self-limited and the will resolve over time. He is requesting a refill was given Percocet 5-325 #30 no refill. We will see him again in 3 months with ultrasound follow-up

## 2014-06-30 NOTE — Addendum Note (Signed)
Addended by: Mena Goes on: 06/30/2014 04:47 PM   Modules accepted: Orders

## 2014-07-01 DIAGNOSIS — E119 Type 2 diabetes mellitus without complications: Secondary | ICD-10-CM | POA: Diagnosis not present

## 2014-07-01 DIAGNOSIS — E780 Pure hypercholesterolemia: Secondary | ICD-10-CM | POA: Diagnosis not present

## 2014-07-01 DIAGNOSIS — R739 Hyperglycemia, unspecified: Secondary | ICD-10-CM | POA: Diagnosis not present

## 2014-07-01 DIAGNOSIS — E785 Hyperlipidemia, unspecified: Secondary | ICD-10-CM | POA: Diagnosis not present

## 2014-07-13 DIAGNOSIS — H26499 Other secondary cataract, unspecified eye: Secondary | ICD-10-CM | POA: Diagnosis not present

## 2014-07-13 DIAGNOSIS — H59092 Other disorders of the left eye following cataract surgery: Secondary | ICD-10-CM | POA: Diagnosis not present

## 2014-08-10 ENCOUNTER — Other Ambulatory Visit: Payer: Self-pay | Admitting: *Deleted

## 2014-08-10 MED ORDER — CARVEDILOL 25 MG PO TABS: 12.5000 mg | ORAL_TABLET | Freq: Two times a day (BID) | ORAL | Status: DC

## 2014-08-13 DIAGNOSIS — T82330A Leakage of aortic (bifurcation) graft (replacement), initial encounter: Secondary | ICD-10-CM | POA: Diagnosis not present

## 2014-08-13 DIAGNOSIS — R9431 Abnormal electrocardiogram [ECG] [EKG]: Secondary | ICD-10-CM | POA: Diagnosis not present

## 2014-08-13 DIAGNOSIS — I517 Cardiomegaly: Secondary | ICD-10-CM | POA: Diagnosis not present

## 2014-08-13 DIAGNOSIS — K439 Ventral hernia without obstruction or gangrene: Secondary | ICD-10-CM | POA: Diagnosis not present

## 2014-08-13 DIAGNOSIS — I723 Aneurysm of iliac artery: Secondary | ICD-10-CM | POA: Diagnosis not present

## 2014-08-13 DIAGNOSIS — K5790 Diverticulosis of intestine, part unspecified, without perforation or abscess without bleeding: Secondary | ICD-10-CM | POA: Diagnosis not present

## 2014-08-13 DIAGNOSIS — Z01818 Encounter for other preprocedural examination: Secondary | ICD-10-CM | POA: Diagnosis not present

## 2014-08-13 DIAGNOSIS — Q200 Common arterial trunk: Secondary | ICD-10-CM | POA: Diagnosis not present

## 2014-08-13 DIAGNOSIS — D689 Coagulation defect, unspecified: Secondary | ICD-10-CM | POA: Diagnosis not present

## 2014-08-13 DIAGNOSIS — M47819 Spondylosis without myelopathy or radiculopathy, site unspecified: Secondary | ICD-10-CM | POA: Diagnosis not present

## 2014-08-13 DIAGNOSIS — N281 Cyst of kidney, acquired: Secondary | ICD-10-CM | POA: Diagnosis not present

## 2014-08-13 DIAGNOSIS — N200 Calculus of kidney: Secondary | ICD-10-CM | POA: Diagnosis not present

## 2014-08-13 DIAGNOSIS — I716 Thoracoabdominal aortic aneurysm, without rupture: Secondary | ICD-10-CM | POA: Diagnosis not present

## 2014-09-07 DIAGNOSIS — I2510 Atherosclerotic heart disease of native coronary artery without angina pectoris: Secondary | ICD-10-CM | POA: Diagnosis present

## 2014-09-07 DIAGNOSIS — Z7982 Long term (current) use of aspirin: Secondary | ICD-10-CM | POA: Diagnosis not present

## 2014-09-07 DIAGNOSIS — I4891 Unspecified atrial fibrillation: Secondary | ICD-10-CM | POA: Diagnosis present

## 2014-09-07 DIAGNOSIS — T82330A Leakage of aortic (bifurcation) graft (replacement), initial encounter: Secondary | ICD-10-CM | POA: Diagnosis not present

## 2014-09-07 DIAGNOSIS — I447 Left bundle-branch block, unspecified: Secondary | ICD-10-CM | POA: Diagnosis present

## 2014-09-07 DIAGNOSIS — I739 Peripheral vascular disease, unspecified: Secondary | ICD-10-CM | POA: Diagnosis present

## 2014-09-07 DIAGNOSIS — Z951 Presence of aortocoronary bypass graft: Secondary | ICD-10-CM | POA: Diagnosis not present

## 2014-09-07 DIAGNOSIS — Z79899 Other long term (current) drug therapy: Secondary | ICD-10-CM | POA: Diagnosis not present

## 2014-09-07 DIAGNOSIS — K219 Gastro-esophageal reflux disease without esophagitis: Secondary | ICD-10-CM | POA: Diagnosis present

## 2014-09-07 DIAGNOSIS — I248 Other forms of acute ischemic heart disease: Secondary | ICD-10-CM | POA: Diagnosis present

## 2014-09-07 DIAGNOSIS — I700 Atherosclerosis of aorta: Secondary | ICD-10-CM | POA: Diagnosis not present

## 2014-09-07 DIAGNOSIS — I255 Ischemic cardiomyopathy: Secondary | ICD-10-CM | POA: Diagnosis present

## 2014-09-07 DIAGNOSIS — I10 Essential (primary) hypertension: Secondary | ICD-10-CM | POA: Diagnosis present

## 2014-09-07 DIAGNOSIS — I719 Aortic aneurysm of unspecified site, without rupture: Secondary | ICD-10-CM | POA: Diagnosis present

## 2014-09-07 DIAGNOSIS — I714 Abdominal aortic aneurysm, without rupture: Secondary | ICD-10-CM | POA: Diagnosis not present

## 2014-09-07 DIAGNOSIS — I517 Cardiomegaly: Secondary | ICD-10-CM | POA: Diagnosis not present

## 2014-09-07 DIAGNOSIS — I472 Ventricular tachycardia: Secondary | ICD-10-CM | POA: Diagnosis not present

## 2014-09-07 DIAGNOSIS — T82538A Leakage of other cardiac and vascular devices and implants, initial encounter: Secondary | ICD-10-CM | POA: Diagnosis present

## 2014-09-07 DIAGNOSIS — R7989 Other specified abnormal findings of blood chemistry: Secondary | ICD-10-CM | POA: Diagnosis present

## 2014-09-07 DIAGNOSIS — I252 Old myocardial infarction: Secondary | ICD-10-CM | POA: Diagnosis not present

## 2014-09-07 DIAGNOSIS — E119 Type 2 diabetes mellitus without complications: Secondary | ICD-10-CM | POA: Diagnosis present

## 2014-09-07 DIAGNOSIS — G4733 Obstructive sleep apnea (adult) (pediatric): Secondary | ICD-10-CM | POA: Diagnosis not present

## 2014-09-07 DIAGNOSIS — Z87891 Personal history of nicotine dependence: Secondary | ICD-10-CM | POA: Diagnosis not present

## 2014-09-07 DIAGNOSIS — I509 Heart failure, unspecified: Secondary | ICD-10-CM | POA: Diagnosis present

## 2014-09-07 DIAGNOSIS — I716 Thoracoabdominal aortic aneurysm, without rupture: Secondary | ICD-10-CM | POA: Diagnosis not present

## 2014-09-07 DIAGNOSIS — Z7902 Long term (current) use of antithrombotics/antiplatelets: Secondary | ICD-10-CM | POA: Diagnosis not present

## 2014-09-07 HISTORY — PX: ABDOMINAL AORTIC ANEURYSM REPAIR: SUR1152

## 2014-09-25 ENCOUNTER — Encounter: Payer: Self-pay | Admitting: *Deleted

## 2014-09-25 ENCOUNTER — Encounter: Payer: Self-pay | Admitting: Vascular Surgery

## 2014-09-28 ENCOUNTER — Telehealth: Payer: Self-pay | Admitting: Cardiology

## 2014-09-28 ENCOUNTER — Encounter: Payer: Self-pay | Admitting: Cardiology

## 2014-09-28 ENCOUNTER — Ambulatory Visit (INDEPENDENT_AMBULATORY_CARE_PROVIDER_SITE_OTHER): Payer: Medicare Other | Admitting: Cardiology

## 2014-09-28 ENCOUNTER — Encounter: Payer: Self-pay | Admitting: *Deleted

## 2014-09-28 VITALS — BP 152/73 | HR 59 | Ht 73.0 in | Wt 226.1 lb

## 2014-09-28 DIAGNOSIS — I255 Ischemic cardiomyopathy: Secondary | ICD-10-CM | POA: Diagnosis not present

## 2014-09-28 DIAGNOSIS — I25118 Atherosclerotic heart disease of native coronary artery with other forms of angina pectoris: Secondary | ICD-10-CM | POA: Diagnosis not present

## 2014-09-28 DIAGNOSIS — I493 Ventricular premature depolarization: Secondary | ICD-10-CM

## 2014-09-28 DIAGNOSIS — I739 Peripheral vascular disease, unspecified: Secondary | ICD-10-CM

## 2014-09-28 DIAGNOSIS — R0602 Shortness of breath: Secondary | ICD-10-CM

## 2014-09-28 DIAGNOSIS — R002 Palpitations: Secondary | ICD-10-CM | POA: Diagnosis not present

## 2014-09-28 NOTE — Patient Instructions (Signed)
Your physician recommends that you continue on your current medications as directed. Please refer to the Current Medication list given to you today. Your physician recommends that you schedule a follow-up appointment in: 1 month. Your physician has requested that you have a lexiscan myoview. For further information please visit [email or web address]. Please follow instruction sheet, as given.

## 2014-09-28 NOTE — Progress Notes (Signed)
Cardiology Office Note  Date: 09/28/2014   ID: SAIMON ROTHBARD, DOB 03/17/1946, MRN [id number]  PCP: Rosita Fire, MD  Primary Cardiologist: Rozann Lesches, MD   Chief Complaint  Patient presents with  . Coronary Artery Disease  . Cardiomyopathy  . Hospitalization Follow-up    History of Present Illness: [name] is a medically complex 69 y.o. male last seen in January of this year. Interval records reviewed, he underwent surgical repair of left popliteal artery aneurysm with left femoral to above-knee popliteal bypass with translocated non-reversed great saphenous vein. There were no obvious perioperative complications. He saw Dr. Donnetta Hutching back in the office in February.  Records from [facility] reviewed, patient reportedly had evidence of a type I endoleak following FEVAR from July 2015, noting extension into bilateral iliac portions of the graft. He was seen in late March and underwent repair by Dr. Sammuel Hines in April. Records indicate that he was noted to a new left bundle branch block by ECG perioperatively. Troponin levels were reported to be minimally increased at peak of only 0.1. He was seen by the cardiology service at [facility] and a follow-up echocardiogram reported LVEF approximately 35% (noted below). It was felt that the patient had evidence of demand ischemia rather than true ACS and was managed medically.  There is mention in some of these records that the patient had ventricular tachycardia, patient states that his "heart was shocked." The cardiology notes indicate no evidence of ventricular tachycardia. It is not entirely clear to me what happened.  He tells me that he has been weak for the last few weeks after hospital discharge, also short of breath. One day he reported more of a feeling of orthopnea. He has not used any nitroglycerin, has had a few mild episodes of midsternal chest discomfort however. Tracing today shows sinus rhythm with left bundle branch block and  ventricular bigeminy. He does not report any sudden syncope.  I discussed with him the results of his testing at [facility] based on record review. Although he did have a ischemic workup last April, I have recommended follow-up testing to reassess ischemic burden.  Past Medical History  Diagnosis Date  . Ischemic cardiomyopathy     LVEF 45-50% 11/2011  . Coronary atherosclerosis of native coronary artery     Multivessel status post CABG  . Angioedema     Secondary to ACE inhibitor  . Bell palsy   . Type 2 diabetes mellitus   . AAA (abdominal aortic aneurysm)     Followed by Dr. Donnetta Hutching  . Essential hypertension, benign   . PAD (peripheral artery disease)     Followed by Dr. Donnetta Hutching  . Sleep apnea   . Arthritis   . Carotid artery disease   . Atrial fibrillation   . Myocardial infarction 2001  . Nephrolithiasis   . Hernia of abdominal wall     Past Surgical History  Procedure Laterality Date  . Hand surgery  2009  . Cataract extraction w/phaco  03/23/2011    Procedure: CATARACT EXTRACTION PHACO AND INTRAOCULAR LENS PLACEMENT (IOC);  Surgeon: Tonny Branch;  Location: [facility];  Service: Ophthalmology;  Laterality: Left;  CDE: 15.99  . Cataract extraction w/phaco  04/17/2011    Procedure: CATARACT EXTRACTION PHACO AND INTRAOCULAR LENS PLACEMENT (IOC);  Surgeon: Tonny Branch;  Location: [facility];  Service: Ophthalmology;  Laterality: Right;  CDE: 23.45  . Foot surgery Left 1963  . Eye surgery    . Abdominal aortic aneurysm repair  November 19, 2013    [facility] :  Dr. Sammuel Hines  . Coronary artery bypass graft  2001    LIMA to LAD, SVG to diagonal and ramus, SVG to OM, SVG to AM  . Bypass graft popliteal to popliteal Left 06/08/2014    Procedure: BYPASS GRAFT FEMORAL ARTERY TO ABOVE KNEE POPLITEAL;  Surgeon: Rosetta Posner, MD;  [facility];  Service: Vascular;  Laterality: Left;    Current Outpatient Prescriptions  Medication Sig Dispense Refill  . acetaminophen (TYLENOL) 500 MG tablet Take 1,000  mg by mouth 2 (two) times daily as needed for headache (pain).    Marland Kitchen allopurinol (ZYLOPRIM) 100 MG tablet Take 100 mg by mouth daily with supper.     Marland Kitchen aspirin EC 81 MG tablet Take 81 mg by mouth daily.    . carvedilol (COREG) 25 MG tablet Take 0.5 tablets (12.5 mg total) by mouth 2 (two) times daily with a meal. 30 tablet 6  . furosemide (LASIX) 40 MG tablet Take 40 mg by mouth 2 (two) times daily.     Marland Kitchen gabapentin (NEURONTIN) 300 MG capsule Take 300 mg by mouth 2 (two) times daily.    Marland Kitchen glipiZIDE (GLUCOTROL XL) 10 MG 24 hr tablet Take 5 mg by mouth 2 (two) times daily.     . isosorbide mononitrate (IMDUR) 60 MG 24 hr tablet Take 2 tablets (120 mg total) by mouth every morning. & 30 mg in the evening (Patient taking differently: Take 30-120 mg by mouth 2 (two) times daily. Take 2 tablets (120 mg) every morning and 1/2 tablet (30 mg) with supper) 90 tablet 3  . linagliptin (TRADJENTA) 5 MG TABS tablet Take 5 mg by mouth daily.      . nitroGLYCERIN (NITROSTAT) 0.4 MG SL tablet Place 0.4 mg under the tongue every 5 (five) minutes as needed for chest pain.    . simvastatin (ZOCOR) 40 MG tablet Take 1 tablet (40 mg total) by mouth at bedtime. 30 tablet 6   No current facility-administered medications for this visit.    Allergies:  Ace inhibitors; Codeine; Lisinopril; and Hydromorphone   Social History: The patient  reports that he quit smoking about 7 years ago. His smoking use included Cigarettes. He started smoking about 35 years ago. He has a 25 pack-year smoking history. He has never used smokeless tobacco. He reports that he does not drink alcohol or use illicit drugs.   ROS:  Please see the history of present illness. Otherwise, complete review of systems is positive for mild claudication.  All other systems are reviewed and negative.   Physical Exam: VS:  BP 152/73 mmHg  Pulse 59  Ht 6\' 1"  (1.854 m)  Wt 226 lb 1.9 oz (102.567 kg)  BMI 29.84 kg/m2  SpO2 97%, BMI Body mass index is 29.84  kg/(m^2).  Wt Readings from Last 3 Encounters:  09/28/14 226 lb 1.9 oz (102.567 kg)  06/30/14 234 lb (106.142 kg)  06/08/14 205 lb 14.6 oz (93.4 kg)     Obese male, appears comfortable at rest.  HEENT: Conjunctiva and lids normal, oropharynx clear.  Neck: Supple, no elevated JVP or carotid bruits, no thyromegaly.  Lungs: Clear to auscultation, nonlabored breathing at rest.  Cardiac: Regular rate and rhythm, S4, no significant systolic murmur, no pericardial rub.  Abdomen: Soft, nontender, protuberant, bowel sounds present.  Extremities: Diminished DPs bilaterally.. Skin: Warm and dry.  Musculoskeletal: No kyphosis.  Neuropsychiatric: Alert and oriented x3, affect grossly appropriate.  ECG: ECG is ordered today and shows sinus rhythm with left bundle branch block and ventricular bigeminy.   Recent Labwork:  March and April 2016: potassium 3.4, BUN 15, creatinine 1.5, hemoglobin 10.2, platelets 324  Other Studies Reviewed Today:  1. Echocardiogram 09/08/2014 [facility]) reported mild LVH with septal dyssynergy, LVEF 35%, impaired relaxation pattern, trivial mitral regurgitation, mild left atrial enlargement, normal RV contraction, PASP not assessed.  2. Lexiscan Cardiolite in April 2015 showed no diagnostic ST segment changes, inferior wall scar with minor peri-infarct ischemia in the mid lateral wall, LVEF 39%.  Assessment and Plan:  1. Weakness, intermittent shortness of breath and angina. Recent hospital stay at [facility] reviewed including follow-up testing. Recommendation is to proceed with a Lexiscan Cardiolite on current medical regimen to reassess ischemic burden. If there has been substantial change since last year, we may need to pursue a cardiac catheterization via radial approach.  2. Ischemic cardiomyopathy, LVEF 35% by recent echocardiogram at [facility], previously had improved on medical therapy.  3. PVCs, questionable ventricular tachycardia at [facility], not well-documented  based on record review including cardiology consultation notes.  4. Peripheral arterial disease status post multiple interventions as discussed above, most recently repair of a type I endoleak at [facility]. Patient reports follow-up with Dr. Donnetta Hutching later this week.  Current medicines were reviewed with the patient today.   Orders Placed This Encounter  Procedures  . NM Myocar Multi W/Spect W/Wall Motion / EF  . Myocardial Perfusion Imaging  . EKG 12-Lead    Disposition: FU with me in 1 month.   Signed, Satira Sark, MD, [facility] 09/28/2014 3:31 PM    [facility] [place], [place], [place] [address] Phone: [phone]; Fax: [phone]

## 2014-09-28 NOTE — Telephone Encounter (Signed)
Lexiscan Cardiolite on medications dx: SOB, CAD & ischemic cardiomyopathy with unspecified angina Schedule at [facility] May 13th arrive at 8am

## 2014-09-29 ENCOUNTER — Ambulatory Visit (INDEPENDENT_AMBULATORY_CARE_PROVIDER_SITE_OTHER): Payer: Medicare Other | Admitting: Vascular Surgery

## 2014-09-29 ENCOUNTER — Encounter: Payer: Self-pay | Admitting: Vascular Surgery

## 2014-09-29 ENCOUNTER — Ambulatory Visit (HOSPITAL_COMMUNITY)
Admission: RE | Admit: 2014-09-29 | Discharge: 2014-09-29 | Disposition: A | Discharge status: Home / Self Care | Payer: Medicare Other | Source: Ambulatory Visit | Attending: Vascular Surgery | Admitting: Vascular Surgery

## 2014-09-29 ENCOUNTER — Ambulatory Visit (INDEPENDENT_AMBULATORY_CARE_PROVIDER_SITE_OTHER)
Admission: RE | Admit: 2014-09-29 | Discharge: 2014-09-29 | Disposition: A | Discharge status: Home / Self Care | Payer: Medicare Other | Source: Ambulatory Visit | Attending: Vascular Surgery | Admitting: Vascular Surgery

## 2014-09-29 VITALS — BP 137/70 | HR 58 | Resp 16 | Ht 72.0 in | Wt 229.5 lb

## 2014-09-29 DIAGNOSIS — I255 Ischemic cardiomyopathy: Secondary | ICD-10-CM

## 2014-09-29 DIAGNOSIS — R29898 Other symptoms and signs involving the musculoskeletal system: Secondary | ICD-10-CM | POA: Diagnosis not present

## 2014-09-29 DIAGNOSIS — Z48812 Encounter for surgical aftercare following surgery on the circulatory system: Secondary | ICD-10-CM

## 2014-09-29 DIAGNOSIS — I724 Aneurysm of artery of lower extremity: Secondary | ICD-10-CM

## 2014-09-29 DIAGNOSIS — Z89412 Acquired absence of left great toe: Secondary | ICD-10-CM | POA: Insufficient documentation

## 2014-09-29 NOTE — Progress Notes (Signed)
Here today for follow-up of his left femoral to above-knee popliteal bypass with translocated non-reversed great saphenous vein for an enlarging distal left superficial femoral artery aneurysm. Surgery was on 06/08/2014. He has returned to his baseline activity. Has had theInvega discomfort associated with his incisional healing. His main concern today is of recent onset of heart palpitations. This is making him feel quite weak. He is having a stress test later in the week for further evaluation. He denies any claudication type symptoms  Past Medical History  Diagnosis Date  . Ischemic cardiomyopathy     LVEF 45-50% 11/2011  . Coronary atherosclerosis of native coronary artery     Multivessel status post CABG  . Angioedema     Secondary to ACE inhibitor  . Bell palsy   . Type 2 diabetes mellitus   . AAA (abdominal aortic aneurysm)     Followed by Dr. Donnetta Hutching  . Essential hypertension, benign   . PAD (peripheral artery disease)     Followed by Dr. Donnetta Hutching  . Sleep apnea   . Arthritis   . Carotid artery disease   . Atrial fibrillation   . Myocardial infarction 2001  . Nephrolithiasis   . Hernia of abdominal wall   . Fatigue     History  Substance Use Topics  . Smoking status: Former Smoker -- 1.00 packs/day for 25 years    Types: Cigarettes    Start date: 02/28/1979    Quit date: 05/23/2007  . Smokeless tobacco: Never Used  . Alcohol Use: No    Family History  Problem Relation Age of Onset  . Diabetes Mother     Type  I   . Varicose Veins Mother   . Heart disease Father 8    AAA  . AAA (abdominal aortic aneurysm) Father   . Hyperlipidemia Father   . Hypertension Father     Allergies  Allergen Reactions  . Ace Inhibitors Anaphylaxis  . Codeine Other (See Comments)     delirium  . Lisinopril Anaphylaxis and Swelling  . Hydromorphone Nausea And Vomiting     Current outpatient prescriptions:  .  acetaminophen (TYLENOL) 500 MG tablet, Take 1,000 mg by mouth 2 (two)  times daily as needed for headache (pain)., Disp: , Rfl:  .  allopurinol (ZYLOPRIM) 100 MG tablet, Take 100 mg by mouth daily with supper. , Disp: , Rfl:  .  aspirin EC 81 MG tablet, Take 81 mg by mouth daily., Disp: , Rfl:  .  carvedilol (COREG) 25 MG tablet, Take 0.5 tablets (12.5 mg total) by mouth 2 (two) times daily with a meal., Disp: 30 tablet, Rfl: 6 .  furosemide (LASIX) 40 MG tablet, Take 40 mg by mouth 2 (two) times daily. , Disp: , Rfl:  .  gabapentin (NEURONTIN) 300 MG capsule, Take 300 mg by mouth 2 (two) times daily., Disp: , Rfl:  .  glipiZIDE (GLUCOTROL XL) 10 MG 24 hr tablet, Take 5 mg by mouth 2 (two) times daily. , Disp: , Rfl:  .  isosorbide mononitrate (IMDUR) 60 MG 24 hr tablet, Take 2 tablets (120 mg total) by mouth every morning. & 30 mg in the evening (Patient taking differently: Take 30-120 mg by mouth 2 (two) times daily. Take 2 tablets (120 mg) every morning and 1/2 tablet (30 mg) with supper), Disp: 90 tablet, Rfl: 3 .  linagliptin (TRADJENTA) 5 MG TABS tablet, Take 5 mg by mouth daily.  , Disp: , Rfl:  .  nitroGLYCERIN (NITROSTAT) 0.4  MG SL tablet, Place 0.4 mg under the tongue every 5 (five) minutes as needed for chest pain., Disp: , Rfl:  .  simvastatin (ZOCOR) 40 MG tablet, Take 1 tablet (40 mg total) by mouth at bedtime., Disp: 30 tablet, Rfl: 6  Filed Vitals:   09/29/14 1145  BP: 137/70  Pulse: 58  Resp: 16  Height: 6' (1.829 m)  Weight: 229 lb 8 oz (104.101 kg)    Body mass index is 31.12 kg/(m^2).       Physical exam will well-nourished gentleman acute distress  Respirations are equal in nonlabored No evidence of ulceration or wound problems on both lower extremities Palpable left popliteal graft pulse.  Noninvasive studies today reveal normal ankle arm index of 0.93. Has biphasic and triphasic waveforms throughout the bypass.  Impression and plan: Stable status post left femoral to above-knee popliteal bypass popliteal artery aneurysm. Will  see [place] again in 6 months with continued noninvasive follow-up. We'll continue follow-up with his cardiologist regarding new onset of palpitations

## 2014-09-29 NOTE — Addendum Note (Signed)
Addended by: Reola Calkins on: 09/29/2014 03:29 PM   Modules accepted: Orders

## 2014-09-30 ENCOUNTER — Observation Stay (HOSPITAL_COMMUNITY)
Admission: EM | Admit: 2014-09-30 | Discharge: 2014-10-03 | Disposition: A | Discharge status: Home / Self Care | Payer: Medicare Other | Attending: Cardiovascular Disease | Admitting: Cardiovascular Disease

## 2014-09-30 ENCOUNTER — Other Ambulatory Visit: Payer: Self-pay

## 2014-09-30 ENCOUNTER — Encounter (HOSPITAL_COMMUNITY): Payer: Medicare Other

## 2014-09-30 ENCOUNTER — Other Ambulatory Visit (HOSPITAL_COMMUNITY): Payer: Self-pay

## 2014-09-30 ENCOUNTER — Encounter (HOSPITAL_COMMUNITY): Payer: Self-pay | Admitting: Emergency Medicine

## 2014-09-30 ENCOUNTER — Telehealth: Payer: Self-pay | Admitting: Cardiology

## 2014-09-30 ENCOUNTER — Emergency Department (HOSPITAL_COMMUNITY): Payer: Medicare Other

## 2014-09-30 DIAGNOSIS — I714 Abdominal aortic aneurysm, without rupture: Secondary | ICD-10-CM | POA: Diagnosis not present

## 2014-09-30 DIAGNOSIS — I2510 Atherosclerotic heart disease of native coronary artery without angina pectoris: Principal | ICD-10-CM | POA: Insufficient documentation

## 2014-09-30 DIAGNOSIS — I739 Peripheral vascular disease, unspecified: Secondary | ICD-10-CM | POA: Diagnosis not present

## 2014-09-30 DIAGNOSIS — I10 Essential (primary) hypertension: Secondary | ICD-10-CM | POA: Diagnosis present

## 2014-09-30 DIAGNOSIS — N183 Chronic kidney disease, stage 3 unspecified: Secondary | ICD-10-CM | POA: Diagnosis present

## 2014-09-30 DIAGNOSIS — R079 Chest pain, unspecified: Secondary | ICD-10-CM

## 2014-09-30 DIAGNOSIS — M109 Gout, unspecified: Secondary | ICD-10-CM | POA: Insufficient documentation

## 2014-09-30 DIAGNOSIS — Z794 Long term (current) use of insulin: Secondary | ICD-10-CM | POA: Insufficient documentation

## 2014-09-30 DIAGNOSIS — I5022 Chronic systolic (congestive) heart failure: Secondary | ICD-10-CM | POA: Diagnosis present

## 2014-09-30 DIAGNOSIS — I447 Left bundle-branch block, unspecified: Secondary | ICD-10-CM | POA: Diagnosis present

## 2014-09-30 DIAGNOSIS — Z87891 Personal history of nicotine dependence: Secondary | ICD-10-CM | POA: Insufficient documentation

## 2014-09-30 DIAGNOSIS — N179 Acute kidney failure, unspecified: Secondary | ICD-10-CM | POA: Diagnosis not present

## 2014-09-30 DIAGNOSIS — R7309 Other abnormal glucose: Secondary | ICD-10-CM

## 2014-09-30 DIAGNOSIS — I252 Old myocardial infarction: Secondary | ICD-10-CM | POA: Insufficient documentation

## 2014-09-30 DIAGNOSIS — I129 Hypertensive chronic kidney disease with stage 1 through stage 4 chronic kidney disease, or unspecified chronic kidney disease: Secondary | ICD-10-CM | POA: Insufficient documentation

## 2014-09-30 DIAGNOSIS — I4891 Unspecified atrial fibrillation: Secondary | ICD-10-CM | POA: Insufficient documentation

## 2014-09-30 DIAGNOSIS — I255 Ischemic cardiomyopathy: Secondary | ICD-10-CM | POA: Diagnosis present

## 2014-09-30 DIAGNOSIS — Z951 Presence of aortocoronary bypass graft: Secondary | ICD-10-CM

## 2014-09-30 DIAGNOSIS — R0789 Other chest pain: Secondary | ICD-10-CM | POA: Diagnosis not present

## 2014-09-30 DIAGNOSIS — Z7901 Long term (current) use of anticoagulants: Secondary | ICD-10-CM | POA: Insufficient documentation

## 2014-09-30 DIAGNOSIS — I493 Ventricular premature depolarization: Secondary | ICD-10-CM | POA: Diagnosis present

## 2014-09-30 DIAGNOSIS — Z7982 Long term (current) use of aspirin: Secondary | ICD-10-CM | POA: Diagnosis not present

## 2014-09-30 DIAGNOSIS — G629 Polyneuropathy, unspecified: Secondary | ICD-10-CM | POA: Diagnosis not present

## 2014-09-30 DIAGNOSIS — E119 Type 2 diabetes mellitus without complications: Secondary | ICD-10-CM | POA: Insufficient documentation

## 2014-09-30 DIAGNOSIS — E785 Hyperlipidemia, unspecified: Secondary | ICD-10-CM | POA: Diagnosis present

## 2014-09-30 DIAGNOSIS — R7303 Prediabetes: Secondary | ICD-10-CM | POA: Diagnosis present

## 2014-09-30 DIAGNOSIS — N2889 Other specified disorders of kidney and ureter: Secondary | ICD-10-CM | POA: Diagnosis present

## 2014-09-30 DIAGNOSIS — I200 Unstable angina: Secondary | ICD-10-CM | POA: Diagnosis present

## 2014-09-30 LAB — COMPREHENSIVE METABOLIC PANEL
ALT: 14 U/L — ABNORMAL LOW (ref 17–63)
AST: 18 U/L (ref 15–41)
Albumin: 3.4 g/dL — ABNORMAL LOW (ref 3.5–5.0)
Alkaline Phosphatase: 67 U/L (ref 38–126)
Anion gap: 10 (ref 5–15)
BUN: 18 mg/dL (ref 6–20)
CO2: 27 mmol/L (ref 22–32)
Calcium: 8.7 mg/dL — ABNORMAL LOW (ref 8.9–10.3)
Chloride: 103 mmol/L (ref 101–111)
Creatinine, Ser: 1.73 mg/dL — ABNORMAL HIGH (ref 0.61–1.24)
GFR calc Af Amer: 46 mL/min — ABNORMAL LOW (ref >60)
GFR calc non Af Amer: 39 mL/min — ABNORMAL LOW (ref >60)
Glucose, Bld: 88 mg/dL (ref 70–99)
Potassium: 3.7 mmol/L (ref 3.5–5.1)
Sodium: 140 mmol/L (ref 135–145)
Total Bilirubin: 0.4 mg/dL (ref 0.3–1.2)
Total Protein: 7.6 g/dL (ref 6.5–8.1)

## 2014-09-30 LAB — CBC WITH DIFFERENTIAL/PLATELET
Basophils Absolute: 0.1 10*3/uL (ref 0.0–0.1)
Basophils Relative: 1 % (ref 0–1)
Eosinophils Absolute: 1 10*3/uL — ABNORMAL HIGH (ref 0.0–0.7)
Eosinophils Relative: 11 % — ABNORMAL HIGH (ref 0–5)
HCT: 32.7 % — ABNORMAL LOW (ref 39.0–52.0)
HEMOGLOBIN: 10.3 g/dL — AB (ref 13.0–17.0)
LYMPHS ABS: 2.6 10*3/uL (ref 0.7–4.0)
Lymphocytes Relative: 27 % (ref 12–46)
MCH: 28.2 pg (ref 26.0–34.0)
MCHC: 31.5 g/dL (ref 30.0–36.0)
MCV: 89.6 fL (ref 78.0–100.0)
Monocytes Absolute: 1.3 10*3/uL — ABNORMAL HIGH (ref 0.1–1.0)
Monocytes Relative: 13 % — ABNORMAL HIGH (ref 3–12)
NEUTROS ABS: 4.7 10*3/uL (ref 1.7–7.7)
NEUTROS PCT: 48 % (ref 43–77)
Platelets: 481 10*3/uL — ABNORMAL HIGH (ref 150–400)
RBC: 3.65 MIL/uL — AB (ref 4.22–5.81)
RDW: 15.6 % — ABNORMAL HIGH (ref 11.5–15.5)
WBC: 9.7 10*3/uL (ref 4.0–10.5)

## 2014-09-30 LAB — GLUCOSE, CAPILLARY: GLUCOSE-CAPILLARY: 75 mg/dL (ref 70–99)

## 2014-09-30 LAB — TROPONIN I

## 2014-09-30 MED ORDER — ONDANSETRON HCL 4 MG/2ML IJ SOLN: 4.0000 mg | Freq: Four times a day (QID) | INTRAMUSCULAR | Status: DC | PRN

## 2014-09-30 MED ORDER — SODIUM CHLORIDE 0.9 % IV SOLN
INTRAVENOUS | Status: DC
  Administered 2014-09-30 – 2014-10-01 (×2): via INTRAVENOUS

## 2014-09-30 MED ORDER — ACETAMINOPHEN 325 MG PO TABS: 650.0000 mg | ORAL_TABLET | ORAL | Status: DC | PRN

## 2014-09-30 MED ORDER — SIMVASTATIN 40 MG PO TABS
40.0000 mg | ORAL_TABLET | Freq: Every day | ORAL | Status: DC
  Administered 2014-09-30 – 2014-10-02 (×3): 40 mg via ORAL
  Filled 2014-09-30 (×2): qty 1
  Filled 2014-09-30: qty 2
  Filled 2014-09-30: qty 1

## 2014-09-30 MED ORDER — GI COCKTAIL ~~LOC~~
30.0000 mL | Freq: Four times a day (QID) | ORAL | Status: DC | PRN
  Filled 2014-09-30: qty 30

## 2014-09-30 MED ORDER — FUROSEMIDE 40 MG PO TABS
40.0000 mg | ORAL_TABLET | Freq: Two times a day (BID) | ORAL | Status: DC
  Administered 2014-09-30 – 2014-10-01 (×2): 40 mg via ORAL
  Filled 2014-09-30 (×2): qty 1

## 2014-09-30 MED ORDER — NITROGLYCERIN 0.4 MG SL SUBL: 0.4000 mg | SUBLINGUAL_TABLET | SUBLINGUAL | Status: DC | PRN

## 2014-09-30 MED ORDER — ACETAMINOPHEN 650 MG RE SUPP: 650.0000 mg | Freq: Four times a day (QID) | RECTAL | Status: DC | PRN

## 2014-09-30 MED ORDER — ASPIRIN 81 MG PO CHEW
324.0000 mg | CHEWABLE_TABLET | Freq: Once | ORAL | Status: AC
  Administered 2014-09-30: 324 mg via ORAL
  Filled 2014-09-30: qty 4

## 2014-09-30 MED ORDER — HEPARIN SODIUM (PORCINE) 5000 UNIT/ML IJ SOLN
5000.0000 [IU] | Freq: Three times a day (TID) | INTRAMUSCULAR | Status: DC
  Administered 2014-09-30 – 2014-10-01 (×4): 5000 [IU] via SUBCUTANEOUS
  Filled 2014-09-30 (×5): qty 1

## 2014-09-30 MED ORDER — CARVEDILOL 12.5 MG PO TABS
12.5000 mg | ORAL_TABLET | Freq: Two times a day (BID) | ORAL | Status: DC
  Administered 2014-10-01 – 2014-10-03 (×4): 12.5 mg via ORAL
  Filled 2014-09-30 (×7): qty 1

## 2014-09-30 MED ORDER — ISOSORBIDE MONONITRATE ER 60 MG PO TB24
120.0000 mg | ORAL_TABLET | Freq: Every morning | ORAL | Status: DC
  Administered 2014-10-01 – 2014-10-03 (×3): 120 mg via ORAL
  Filled 2014-09-30 (×3): qty 2

## 2014-09-30 MED ORDER — GABAPENTIN 300 MG PO CAPS
300.0000 mg | ORAL_CAPSULE | Freq: Two times a day (BID) | ORAL | Status: DC
  Administered 2014-09-30 – 2014-10-03 (×6): 300 mg via ORAL
  Filled 2014-09-30 (×7): qty 1

## 2014-09-30 MED ORDER — ALLOPURINOL 100 MG PO TABS
100.0000 mg | ORAL_TABLET | Freq: Every day | ORAL | Status: DC
  Administered 2014-10-01 – 2014-10-02 (×2): 100 mg via ORAL
  Filled 2014-09-30 (×3): qty 1

## 2014-09-30 MED ORDER — ACETAMINOPHEN 325 MG PO TABS: 650.0000 mg | ORAL_TABLET | Freq: Four times a day (QID) | ORAL | Status: DC | PRN

## 2014-09-30 MED ORDER — INSULIN ASPART 100 UNIT/ML ~~LOC~~ SOLN
0.0000 [IU] | Freq: Three times a day (TID) | SUBCUTANEOUS | Status: DC
  Administered 2014-10-01 – 2014-10-02 (×3): 1 [IU] via SUBCUTANEOUS

## 2014-09-30 MED ORDER — OXYCODONE HCL 5 MG PO TABS: 5.0000 mg | ORAL_TABLET | ORAL | Status: DC | PRN

## 2014-09-30 MED ORDER — SODIUM CHLORIDE 0.9 % IJ SOLN
3.0000 mL | Freq: Two times a day (BID) | INTRAMUSCULAR | Status: DC
  Administered 2014-09-30 – 2014-10-03 (×4): 3 mL via INTRAVENOUS

## 2014-09-30 MED ORDER — SODIUM CHLORIDE 0.45 % IV SOLN
INTRAVENOUS | Status: DC
  Administered 2014-09-30: 20:00:00 via INTRAVENOUS

## 2014-09-30 MED ORDER — ASPIRIN EC 81 MG PO TBEC
81.0000 mg | DELAYED_RELEASE_TABLET | Freq: Every day | ORAL | Status: DC
  Administered 2014-09-30 – 2014-10-03 (×4): 81 mg via ORAL
  Filled 2014-09-30 (×4): qty 1

## 2014-09-30 NOTE — ED Notes (Signed)
Pt states that he had an aneurysm repair on 09/07/14 and went into an arrhythmia during surgery and has been having weakness and bradycardic episodes since.

## 2014-09-30 NOTE — H&P (Signed)
[facility] History and Physical  MAC DEFORE [id number] DOB: [birth year] DOA: 09/30/2014  Referring physician: Dr. Rogene Houston - [facility] PCP: Rosita Fire, MD   Chief Complaint: Bradycardia  HPI: [name] is a 69 y.o. male  Page presented for low heart beat and chest pain. Acute onset at 05 30 this morning. Pain woke patient up from sleep. Patient reports when he awoke he was already sitting up in his bed. This was concerning to patient as he previously was only able to relieve his chest pain while resting sitting up. Patient laid back down in bed and within 5 minutes his chest pain returned. Patient reports taking nitroglycerin with relief of chest pain. Chest pain is described as more of a tightness sensation in his substernal area and left chest. No radiation to the neck or shoulder. Patient with significant cardiac history including CABG and describes the symptoms similar to his previous heart attack.. Denies associated symptoms of nausea, vomiting, diaphoresis and significant shortness of breath. Patient took his blood pressure at the time of symptoms and noted it to be normal but that his heart rate was in the 30s and 40s. He check this multiple times with similar readings. She states being in his normal state of health otherwise and takes all his medications as prescribed.    Review of Systems:  Constitutional:  No weight loss, night sweats, Fevers, chills, fatigue.  HEENT:  No headaches, Difficulty swallowing,Tooth/dental problems,Sore throat,  No sneezing, itching, ear ache, nasal congestion, post nasal drip,  Cardio-vascular:  Per HPI GI:  No heartburn, indigestion, abdominal pain, nausea, vomiting, diarrhea, change in bowel habits, loss of appetite  Resp:   No shortness of breath with exertion or at rest. No excess mucus, no productive cough, No non-productive cough, No coughing up of blood.No change in color of mucus.No wheezing.No chest wall deformity    Skin:  no rash or lesions.  GU:  no dysuria, change in color of urine, no urgency or frequency. No flank pain.  Musculoskeletal:   No joint pain or swelling. No decreased range of motion. No back pain.  Psych:  No change in mood or affect. No depression or anxiety. No memory loss.   Past Medical History  Diagnosis Date  . Ischemic cardiomyopathy     LVEF 45-50% 11/2011  . Coronary atherosclerosis of native coronary artery     Multivessel status post CABG  . Angioedema     Secondary to ACE inhibitor  . Bell palsy   . Type 2 diabetes mellitus   . AAA (abdominal aortic aneurysm)     Followed by Dr. Donnetta Hutching  . Essential hypertension, benign   . PAD (peripheral artery disease)     Followed by Dr. Donnetta Hutching  . Sleep apnea   . Arthritis   . Carotid artery disease   . Atrial fibrillation   . Myocardial infarction 2001  . Nephrolithiasis   . Hernia of abdominal wall   . Fatigue    Past Surgical History  Procedure Laterality Date  . Hand surgery  2009  . Cataract extraction w/phaco  03/23/2011    Procedure: CATARACT EXTRACTION PHACO AND INTRAOCULAR LENS PLACEMENT (IOC);  Surgeon: Tonny Branch;  Location: [facility];  Service: Ophthalmology;  Laterality: Left;  CDE: 15.99  . Cataract extraction w/phaco  04/17/2011    Procedure: CATARACT EXTRACTION PHACO AND INTRAOCULAR LENS PLACEMENT (IOC);  Surgeon: Tonny Branch;  Location: [facility];  Service: Ophthalmology;  Laterality: Right;  CDE: 23.45  . Foot surgery  Left 1963  . Eye surgery    . Coronary artery bypass graft  2001    LIMA to LAD, SVG to diagonal and ramus, SVG to OM, SVG to AM  . Bypass graft popliteal to popliteal Left 06/08/2014    Procedure: BYPASS GRAFT FEMORAL ARTERY TO ABOVE KNEE POPLITEAL;  Surgeon: Rosetta Posner, MD;  Location: [place];  Service: Vascular;  Laterality: Left;  . Abdominal aortic aneurysm repair  November 19, 2013    [facility] :  Dr. Sammuel Hines  . Abdominal aortic aneurysm repair  09-07-2014    [facility]   Social  History:  reports that he quit smoking about 7 years ago. His smoking use included Cigarettes. He started smoking about 35 years ago. He has a 25 pack-year smoking history. He has never used smokeless tobacco. He reports that he does not drink alcohol or use illicit drugs.  Allergies  Allergen Reactions  . Ace Inhibitors Anaphylaxis  . Codeine Other (See Comments)     delirium  . Lisinopril Anaphylaxis and Swelling  . Hydromorphone Nausea And Vomiting    Family History  Problem Relation Age of Onset  . Diabetes Mother     Type  I   . Varicose Veins Mother   . Heart disease Father 95    AAA  . AAA (abdominal aortic aneurysm) Father   . Hyperlipidemia Father   . Hypertension Father      Prior to Admission medications   Medication Sig Start Date End Date Taking? Authorizing Provider  acetaminophen (TYLENOL) 500 MG tablet Take 1,000 mg by mouth 2 (two) times daily as needed for headache (pain).   Yes Historical Provider, MD  allopurinol (ZYLOPRIM) 100 MG tablet Take 100 mg by mouth daily with supper.    Yes Historical Provider, MD  aspirin EC 81 MG tablet Take 81 mg by mouth daily.   Yes Historical Provider, MD  carvedilol (COREG) 25 MG tablet Take 0.5 tablets (12.5 mg total) by mouth 2 (two) times daily with a meal. 08/10/14  Yes Satira Sark, MD  furosemide (LASIX) 40 MG tablet Take 40 mg by mouth 2 (two) times daily.    Yes Historical Provider, MD  gabapentin (NEURONTIN) 300 MG capsule Take 300 mg by mouth 2 (two) times daily.   Yes Historical Provider, MD  glipiZIDE (GLUCOTROL XL) 10 MG 24 hr tablet Take 5 mg by mouth 2 (two) times daily.  05/08/14  Yes Historical Provider, MD  isosorbide mononitrate (IMDUR) 60 MG 24 hr tablet Take 2 tablets (120 mg total) by mouth every morning. & 30 mg in the evening Patient taking differently: Take 30-120 mg by mouth 2 (two) times daily. Take 2 tablets (120 mg) every morning and 1/2 tablet (30 mg) with supper 12/29/13  Yes Satira Sark, MD   linagliptin (TRADJENTA) 5 MG TABS tablet Take 5 mg by mouth daily.     Yes Historical Provider, MD  nitroGLYCERIN (NITROSTAT) 0.4 MG SL tablet Place 0.4 mg under the tongue every 5 (five) minutes as needed for chest pain.   Yes Historical Provider, MD  simvastatin (ZOCOR) 40 MG tablet Take 1 tablet (40 mg total) by mouth at bedtime. 02/27/14  Yes Satira Sark, MD   Physical Exam: Filed Vitals:   09/30/14 1730 09/30/14 1745 09/30/14 1800 09/30/14 1815  BP: 143/76  136/61   Pulse: 68 67 54 63  Temp:      TempSrc:      Resp:  10 13 15   Height:      Weight:      SpO2: 93% 99% 96% 98%    Wt Readings from Last 3 Encounters:  09/30/14 101.152 kg (223 lb)  09/29/14 104.101 kg (229 lb 8 oz)  09/28/14 102.567 kg (226 lb 1.9 oz)    General:  Appears calm and comfortable Eyes:  PERRL, normal lids, irises & conjunctiva ENT:  grossly normal hearing, lips & tongue Neck:  no LAD, masses or thyromegaly Cardiovascular:  RRR, no m/r/g. 0-1+ edema in the right lower extremity and 1+ pitting edema in the left lower extremity  Respiratory:  CTA bilaterally, no w/r/r. Normal respiratory effort. Abdomen:  soft, ntnd Skin: Numerous surgical scars noted. no rash or induration seen on limited exam Musculoskeletal:  grossly normal tone BUE/BLE Psychiatric:  grossly normal mood and affect, speech fluent and appropriate Neurologic:  grossly non-focal.          Labs on Admission:  Basic Metabolic Panel:  Recent Labs Lab 09/30/14 1512  NA 140  K 3.7  CL 103  CO2 27  GLUCOSE 88  BUN 18  CREATININE 1.73*  CALCIUM 8.7*   Liver Function Tests:  Recent Labs Lab 09/30/14 1512  AST 18  ALT 14*  ALKPHOS 67  BILITOT 0.4  PROT 7.6  ALBUMIN 3.4*   No results for input(s): LIPASE, AMYLASE in the last 168 hours. No results for input(s): AMMONIA in the last 168 hours. CBC:  Recent Labs Lab 09/30/14 1512  WBC 9.7  NEUTROABS 4.7  HGB 10.3*  HCT 32.7*  MCV 89.6  PLT 481*   Cardiac  Enzymes:  Recent Labs Lab 09/30/14 1512  TROPONINI <0.03    BNP (last 3 results) No results for input(s): BNP in the last 8760 hours.  ProBNP (last 3 results) No results for input(s): PROBNP in the last 8760 hours.  CBG: No results for input(s): GLUCAP in the last 168 hours.  Radiological Exams on Admission: Dg Chest 2 View  09/30/2014   CLINICAL DATA:  Weakness with mid chest pain  EXAM: CHEST  2 VIEW  COMPARISON:  06/03/2014  FINDINGS: Cardiac shadow remains mildly enlarged. Postsurgical changes are noted. The lungs are well aerated bilaterally without focal infiltrate or sizable effusion. An aortic stent graft is seen.  IMPRESSION: No acute abnormality noted.   Electronically Signed   By: Inez Catalina M.D.   On: 09/30/2014 16:25    EKG: Independently reviewed. Sinus, left bundle branch block, no sign of ACS. LBBB noted on previous EKG.  Assessment/Plan Principal Problem:   Chest pain Active Problems:   Acute kidney injury   CKD (chronic kidney disease)   Pre-diabetes   Chronic systolic congestive heart failure   Peripheral neuropathy   Essential hypertension   Gout   HLD (hyperlipidemia)  Chest pain: Patient with significant cardiac history including multiple heart attacks, catheterizations, quadruple bypass. Description of current chest pain symptoms concerning for cardiac etiology. Initial troponin negative and EKG without evidence of new ischemia. - Telemetry - Cycle troponin - GI cocktail - EKG in a.m. - Consider Consult to pts cardiologist Dr. Domenic Polite as he was slated to have a stress test in his office later this week.   Acute on chronic kidney disease: Creatinine 1.73. Baseline 1.45 - BMET in am - IVF  Diabetes: Last A1c January 2016 of 6.3. Technically prediabetic range. - Hold home glipizide and Tradjenta - SSI - A1c  Peripheral neuropathy: - Continue gabapentin  Chronic systolic congestive  heart failure: Last echo 07/24/2013 showing EF of  35-40% - Continue Lasix - Continue beta blocker  HLD: - Continue Zocor.  Hypertension: - Continue Imdur, carvedilol  Gout: No signs of acute flare - Continue allopurinol   Code Status: FULL DVT Prophylaxis: Hep Family Communication: None Disposition Plan: pending improvement and CP r/o  MERRELL, DAVID J, MD Family Medicine [facility] [email or web address] Password [email or web address]

## 2014-09-30 NOTE — Telephone Encounter (Signed)
Mr. Slesinski called stating that his HR was 44 approximately 5 minutes. Woke up with angina.  Very concerned.

## 2014-09-30 NOTE — Telephone Encounter (Signed)
I just saw Mr. Siqueira in the office in follow-up of a complex hospital stay at [facility]. Please see my recent note from May 9. We had planned to proceed with a Lexiscan Cardiolite for follow-up ischemic surveillance in light of his weakness and shortness of breath. LVEF was also reduced by assessment at [facility] to 35% range. I saw that he had PVCs and there was question of VT at [facility] although not well documented in the [facility] cardiology consultation. If he had severe angina this morning and is having slow heart rates (could be ventricular bigeminy), I agree that he should be seen in the ER for probable hospital admission to sort things out more rapidly.

## 2014-09-30 NOTE — ED Provider Notes (Addendum)
CSN: [id number]     Arrival date & time 09/30/14  1448 History   First MD Initiated Contact with Patient 09/30/14 1509     Chief Complaint  Patient presents with  . Bradycardia     (Consider location/radiation/quality/duration/timing/severity/associated sxs/prior Treatment) The history is provided by the patient.   69 year old male followed by Dr. Legrand Rams, acute onset of chest pain this morning at 5:30 that woke him. He took one of his nitroglycerin and it resolved over about 5 minute time period. But since then patient has had the occasional episodes of chest tightness on and off in the substernal area and left chest area nonradiating. The initial pain was pretty severe 10 out of 10. Patient has a known cardiac cup history CABG many years ago no recent cardiac cath. Not having any angina type symptoms for a while but the episode this morning was consistent with that. Also the chest the pressure feeling is also unusual. Also somewhat described as chest tightness as well. So she was some mild shortness of breath on and off no nausea no vomiting no diaphoresis. But what brought patient in was that he checked his heart rate as it felt irregular and he was getting a heart rate in the 30s on his blood pressure cuff machine at home so he contacted his cardiologist and was told to come in.  Past Medical History  Diagnosis Date  . Ischemic cardiomyopathy     LVEF 45-50% 11/2011  . Coronary atherosclerosis of native coronary artery     Multivessel status post CABG  . Angioedema     Secondary to ACE inhibitor  . Bell palsy   . Type 2 diabetes mellitus   . AAA (abdominal aortic aneurysm)     Followed by Dr. Donnetta Hutching  . Essential hypertension, benign   . PAD (peripheral artery disease)     Followed by Dr. Donnetta Hutching  . Sleep apnea   . Arthritis   . Carotid artery disease   . Atrial fibrillation   . Myocardial infarction 2001  . Nephrolithiasis   . Hernia of abdominal wall   . Fatigue    Past Surgical  History  Procedure Laterality Date  . Hand surgery  2009  . Cataract extraction w/phaco  03/23/2011    Procedure: CATARACT EXTRACTION PHACO AND INTRAOCULAR LENS PLACEMENT (IOC);  Surgeon: Tonny Branch;  Location: [facility];  Service: Ophthalmology;  Laterality: Left;  CDE: 15.99  . Cataract extraction w/phaco  04/17/2011    Procedure: CATARACT EXTRACTION PHACO AND INTRAOCULAR LENS PLACEMENT (IOC);  Surgeon: Tonny Branch;  Location: [facility];  Service: Ophthalmology;  Laterality: Right;  CDE: 23.45  . Foot surgery Left 1963  . Eye surgery    . Coronary artery bypass graft  2001    LIMA to LAD, SVG to diagonal and ramus, SVG to OM, SVG to AM  . Bypass graft popliteal to popliteal Left 06/08/2014    Procedure: BYPASS GRAFT FEMORAL ARTERY TO ABOVE KNEE POPLITEAL;  Surgeon: Rosetta Posner, MD;  Location: [place];  Service: Vascular;  Laterality: Left;  . Abdominal aortic aneurysm repair  November 19, 2013    [facility] :  Dr. Sammuel Hines  . Abdominal aortic aneurysm repair  09-07-2014    [facility]   Family History  Problem Relation Age of Onset  . Diabetes Mother     Type  I   . Varicose Veins Mother   . Heart disease Father 27    AAA  .  AAA (abdominal aortic aneurysm) Father   . Hyperlipidemia Father   . Hypertension Father    History  Substance Use Topics  . Smoking status: Former Smoker -- 1.00 packs/day for 25 years    Types: Cigarettes    Start date: 02/28/1979    Quit date: 05/23/2007  . Smokeless tobacco: Never Used  . Alcohol Use: No    Review of Systems  Constitutional: Negative for fever.  HENT: Negative for congestion.   Eyes: Negative for visual disturbance.  Respiratory: Positive for shortness of breath.   Cardiovascular: Positive for chest pain and palpitations.  Gastrointestinal: Negative for nausea, vomiting and abdominal pain.  Genitourinary: Negative for dysuria.  Musculoskeletal: Negative for back pain.  Skin: Negative for rash.  Neurological: Negative for headaches.   Hematological: Does not bruise/bleed easily.  Psychiatric/Behavioral: Negative for confusion.      Allergies  Ace inhibitors; Codeine; Lisinopril; and Hydromorphone  Home Medications   Prior to Admission medications   Medication Sig Start Date End Date Taking? Authorizing Provider  acetaminophen (TYLENOL) 500 MG tablet Take 1,000 mg by mouth 2 (two) times daily as needed for headache (pain).   Yes Historical Provider, MD  allopurinol (ZYLOPRIM) 100 MG tablet Take 100 mg by mouth daily with supper.    Yes Historical Provider, MD  aspirin EC 81 MG tablet Take 81 mg by mouth daily.   Yes Historical Provider, MD  carvedilol (COREG) 25 MG tablet Take 0.5 tablets (12.5 mg total) by mouth 2 (two) times daily with a meal. 08/10/14  Yes Satira Sark, MD  furosemide (LASIX) 40 MG tablet Take 40 mg by mouth 2 (two) times daily.    Yes Historical Provider, MD  gabapentin (NEURONTIN) 300 MG capsule Take 300 mg by mouth 2 (two) times daily.   Yes Historical Provider, MD  glipiZIDE (GLUCOTROL XL) 10 MG 24 hr tablet Take 5 mg by mouth 2 (two) times daily.  05/08/14  Yes Historical Provider, MD  isosorbide mononitrate (IMDUR) 60 MG 24 hr tablet Take 2 tablets (120 mg total) by mouth every morning. & 30 mg in the evening Patient taking differently: Take 30-120 mg by mouth 2 (two) times daily. Take 2 tablets (120 mg) every morning and 1/2 tablet (30 mg) with supper 12/29/13  Yes Satira Sark, MD  linagliptin (TRADJENTA) 5 MG TABS tablet Take 5 mg by mouth daily.     Yes Historical Provider, MD  nitroGLYCERIN (NITROSTAT) 0.4 MG SL tablet Place 0.4 mg under the tongue every 5 (five) minutes as needed for chest pain.   Yes Historical Provider, MD  simvastatin (ZOCOR) 40 MG tablet Take 1 tablet (40 mg total) by mouth at bedtime. 02/27/14  Yes Satira Sark, MD   BP 136/63 mmHg  Pulse 65  Temp(Src) 98 F (36.7 C) (Oral)  Resp 17  Ht 6\' 1"  (1.854 m)  Wt 223 lb (101.152 kg)  BMI 29.43 kg/m2   SpO2 96% Physical Exam  Constitutional: He is oriented to person, place, and time. He appears well-developed and well-nourished. No distress.  HENT:  Head: Normocephalic and atraumatic.  Mouth/Throat: Oropharynx is clear and moist.  Eyes: Conjunctivae and EOM are normal. Pupils are equal, round, and reactive to light.  Neck: Normal range of motion. Neck supple.  Cardiovascular: Normal rate, regular rhythm and normal heart sounds.   No murmur heard. Pulmonary/Chest: Effort normal and breath sounds normal. No respiratory distress.  Abdominal: Soft. Bowel sounds are normal. There is no tenderness.  Musculoskeletal:  Normal range of motion. He exhibits no edema.  Neurological: He is alert and oriented to person, place, and time. No cranial nerve deficit. He exhibits normal muscle tone. Coordination normal.  Skin: No rash noted.  Nursing note and vitals reviewed.   ED Course  Procedures (including critical care time) Labs Review Labs Reviewed  CBC WITH DIFFERENTIAL/PLATELET - Abnormal; Notable for the following:    RBC 3.65 (*)    Hemoglobin 10.3 (*)    HCT 32.7 (*)    RDW 15.6 (*)    Platelets 481 (*)    Monocytes Relative 13 (*)    Monocytes Absolute 1.3 (*)    Eosinophils Relative 11 (*)    Eosinophils Absolute 1.0 (*)    All other components within normal limits  COMPREHENSIVE METABOLIC PANEL - Abnormal; Notable for the following:    Creatinine, Ser 1.73 (*)    Calcium 8.7 (*)    Albumin 3.4 (*)    ALT 14 (*)    GFR calc non Af Amer 39 (*)    GFR calc Af Amer 46 (*)    All other components within normal limits  TROPONIN I   Results for orders placed or performed during the hospital encounter of 09/30/14  CBC with Differential  Result Value Ref Range   WBC 9.7 4.0 - 10.5 K/uL   RBC 3.65 (L) 4.22 - 5.81 MIL/uL   Hemoglobin 10.3 (L) 13.0 - 17.0 g/dL   HCT 32.7 (L) 39.0 - 52.0 %   MCV 89.6 78.0 - 100.0 fL   MCH 28.2 26.0 - 34.0 pg   MCHC 31.5 30.0 - 36.0 g/dL   RDW 15.6  (H) 11.5 - 15.5 %   Platelets 481 (H) 150 - 400 K/uL   Neutrophils Relative % 48 43 - 77 %   Neutro Abs 4.7 1.7 - 7.7 K/uL   Lymphocytes Relative 27 12 - 46 %   Lymphs Abs 2.6 0.7 - 4.0 K/uL   Monocytes Relative 13 (H) 3 - 12 %   Monocytes Absolute 1.3 (H) 0.1 - 1.0 K/uL   Eosinophils Relative 11 (H) 0 - 5 %   Eosinophils Absolute 1.0 (H) 0.0 - 0.7 K/uL   Basophils Relative 1 0 - 1 %   Basophils Absolute 0.1 0.0 - 0.1 K/uL  Comprehensive metabolic panel  Result Value Ref Range   Sodium 140 135 - 145 mmol/L   Potassium 3.7 3.5 - 5.1 mmol/L   Chloride 103 101 - 111 mmol/L   CO2 27 22 - 32 mmol/L   Glucose, Bld 88 70 - 99 mg/dL   BUN 18 6 - 20 mg/dL   Creatinine, Ser 1.73 (H) 0.61 - 1.24 mg/dL   Calcium 8.7 (L) 8.9 - 10.3 mg/dL   Total Protein 7.6 6.5 - 8.1 g/dL   Albumin 3.4 (L) 3.5 - 5.0 g/dL   AST 18 15 - 41 U/L   ALT 14 (L) 17 - 63 U/L   Alkaline Phosphatase 67 38 - 126 U/L   Total Bilirubin 0.4 0.3 - 1.2 mg/dL   GFR calc non Af Amer 39 (L) >60 mL/min   GFR calc Af Amer 46 (L) >60 mL/min   Anion gap 10 5 - 15  Troponin I  Result Value Ref Range   Troponin I <0.03 <0.031 ng/mL     Imaging Review Dg Chest 2 View  09/30/2014   CLINICAL DATA:  Weakness with mid chest pain  EXAM: CHEST  2 VIEW  COMPARISON:  06/03/2014  FINDINGS: Cardiac shadow remains mildly enlarged. Postsurgical changes are noted. The lungs are well aerated bilaterally without focal infiltrate or sizable effusion. An aortic stent graft is seen.  IMPRESSION: No acute abnormality noted.   Electronically Signed   By: Inez Catalina M.D.   On: 09/30/2014 16:25     EKG Interpretation None      EKG did not transfer over into [facility].  ED ECG REPORT   Date: 09/30/2014  Rate: 75  Rhythm: normal sinus rhythm and premature ventricular contractions (PVC)  QRS Axis: normal  Intervals: normal  ST/T Wave abnormalities: nonspecific ST/T changes  Conduction Disutrbances:left bundle branch block  Narrative  Interpretation:   Old EKG Reviewed: none available  I have personally reviewed the EKG tracing and agree with the computerized printout as noted.    MDM   Final diagnoses:  Chest pain  Frequent PVCs    Patient requires admission for chest pain. Patient originally came in for concern for bradycardia. Was getting heart rates in the 30s, but here based on the EKG and cardiac monitoring heart rates of been the tendon 70s been having frequent PVCs occasional bigeminy. Patient had chest pain that woke him at 5:30 in the morning he took nitroglycerin and it went away within 5 minutes but since that time he's been having ongoing substernal and left-sided chest pain pressure. Nothing like the severe pain he had at 5:30. First troponin is negative EKG as above. Chest x-rays negative. Labs without any significant abnormalities. Does have some renal insufficiency. Patient's had a CABG in the past many years ago was followed by cardiology here has not had a recent cardiac catheterization. Patient does not normally get pain like he had this morning. Also does not normally get pressure-like his had throughout the day.    Fredia Sorrow, MD 09/30/14 [place], MD 09/30/14 1744

## 2014-09-30 NOTE — Telephone Encounter (Signed)
Patient called with c/o having a slow heart rate today of 44 with fatigue and SOB. Patient described that he was being very careful when walking due to the fatigue. Patient said he checked his HR 10 minutes later and said it was 57. No c/o lightheadedness or dizziness. Patient said he also experiencedsevere angina this morning that awoke him out of his sleep. Patient said he took nitroglycerin x's 1 and felt relief. Patient said his BP 141/62. Patient advised to go to the ED for an evaluation. Patient verbalized understanding of plan.

## 2014-10-01 DIAGNOSIS — I739 Peripheral vascular disease, unspecified: Secondary | ICD-10-CM | POA: Diagnosis present

## 2014-10-01 DIAGNOSIS — I209 Angina pectoris, unspecified: Secondary | ICD-10-CM

## 2014-10-01 DIAGNOSIS — I255 Ischemic cardiomyopathy: Secondary | ICD-10-CM

## 2014-10-01 DIAGNOSIS — I10 Essential (primary) hypertension: Secondary | ICD-10-CM

## 2014-10-01 DIAGNOSIS — I5022 Chronic systolic (congestive) heart failure: Secondary | ICD-10-CM | POA: Diagnosis not present

## 2014-10-01 DIAGNOSIS — I200 Unstable angina: Secondary | ICD-10-CM

## 2014-10-01 DIAGNOSIS — N183 Chronic kidney disease, stage 3 (moderate): Secondary | ICD-10-CM

## 2014-10-01 DIAGNOSIS — R531 Weakness: Secondary | ICD-10-CM

## 2014-10-01 DIAGNOSIS — Z951 Presence of aortocoronary bypass graft: Secondary | ICD-10-CM

## 2014-10-01 DIAGNOSIS — I493 Ventricular premature depolarization: Secondary | ICD-10-CM | POA: Diagnosis not present

## 2014-10-01 DIAGNOSIS — I447 Left bundle-branch block, unspecified: Secondary | ICD-10-CM | POA: Diagnosis present

## 2014-10-01 DIAGNOSIS — I519 Heart disease, unspecified: Secondary | ICD-10-CM

## 2014-10-01 DIAGNOSIS — E785 Hyperlipidemia, unspecified: Secondary | ICD-10-CM

## 2014-10-01 LAB — CBC
HCT: 32.5 % — ABNORMAL LOW (ref 39.0–52.0)
HEMOGLOBIN: 10.1 g/dL — AB (ref 13.0–17.0)
MCH: 27.9 pg (ref 26.0–34.0)
MCHC: 31.1 g/dL (ref 30.0–36.0)
MCV: 89.8 fL (ref 78.0–100.0)
Platelets: 443 10*3/uL — ABNORMAL HIGH (ref 150–400)
RBC: 3.62 MIL/uL — ABNORMAL LOW (ref 4.22–5.81)
RDW: 15.6 % — ABNORMAL HIGH (ref 11.5–15.5)
WBC: 9 10*3/uL (ref 4.0–10.5)

## 2014-10-01 LAB — BASIC METABOLIC PANEL
ANION GAP: 9 (ref 5–15)
BUN: 16 mg/dL (ref 6–20)
CALCIUM: 8.5 mg/dL — AB (ref 8.9–10.3)
CO2: 27 mmol/L (ref 22–32)
CREATININE: 1.57 mg/dL — AB (ref 0.61–1.24)
Chloride: 104 mmol/L (ref 101–111)
GFR calc Af Amer: 51 mL/min — ABNORMAL LOW (ref >60)
GFR calc non Af Amer: 44 mL/min — ABNORMAL LOW (ref >60)
Glucose, Bld: 109 mg/dL — ABNORMAL HIGH (ref 65–99)
Potassium: 3.6 mmol/L (ref 3.5–5.1)
Sodium: 140 mmol/L (ref 135–145)

## 2014-10-01 LAB — GLUCOSE, CAPILLARY
GLUCOSE-CAPILLARY: 90 mg/dL (ref 65–99)
Glucose-Capillary: 95 mg/dL (ref 65–99)
Glucose-Capillary: 138 mg/dL — ABNORMAL HIGH (ref 65–99)
Glucose-Capillary: 147 mg/dL — ABNORMAL HIGH (ref 65–99)

## 2014-10-01 LAB — TROPONIN I: Troponin I: <0.03 ng/mL (ref <0.031)

## 2014-10-01 MED ORDER — SODIUM CHLORIDE 0.9 % IV SOLN: 250.0000 mL | INTRAVENOUS | Status: DC | PRN

## 2014-10-01 MED ORDER — LINAGLIPTIN 5 MG PO TABS
5.0000 mg | ORAL_TABLET | Freq: Every day | ORAL | Status: DC
  Administered 2014-10-01 – 2014-10-03 (×2): 5 mg via ORAL
  Filled 2014-10-01 (×3): qty 1

## 2014-10-01 MED ORDER — GLIPIZIDE ER 5 MG PO TB24
5.0000 mg | ORAL_TABLET | Freq: Two times a day (BID) | ORAL | Status: DC
  Administered 2014-10-01 – 2014-10-03 (×3): 5 mg via ORAL
  Filled 2014-10-01 (×7): qty 1

## 2014-10-01 MED ORDER — SODIUM CHLORIDE 0.9 % IV SOLN
INTRAVENOUS | Status: DC
  Administered 2014-10-01: 20:00:00 via INTRAVENOUS

## 2014-10-01 MED ORDER — POTASSIUM CHLORIDE CRYS ER 20 MEQ PO TBCR
20.0000 meq | EXTENDED_RELEASE_TABLET | Freq: Once | ORAL | Status: AC
  Administered 2014-10-01: 20 meq via ORAL
  Filled 2014-10-01: qty 1

## 2014-10-01 MED ORDER — SODIUM CHLORIDE 0.9 % IJ SOLN
3.0000 mL | Freq: Two times a day (BID) | INTRAMUSCULAR | Status: DC
  Administered 2014-10-01 – 2014-10-02 (×2): 3 mL via INTRAVENOUS

## 2014-10-01 MED ORDER — SODIUM CHLORIDE 0.9 % IJ SOLN: 3.0000 mL | INTRAMUSCULAR | Status: DC | PRN

## 2014-10-01 NOTE — Consult Note (Signed)
Reason for Consult:   Chest pain  Requesting Physician: Dr Marin Comment Primary Cardiologist Dr Domenic Polite  HPI:   Complicated 69 y/o male followed by Dr Domenic Polite with a history of CAD, s/p CABG in 2001. Cath in 2009 showed OM disease with patent grafts- med Rx. He has had varying degrees of LVD and at one point was recommended an ICD but his EF improved. He has PVD and had an aortic stent graft at [facility] July 2015. He had a popliteal aneurysm repair in January 2016 (Dr Early). This past April he went back to Dr Sammuel Hines at [facility] for a repair of his stent graft endoleak. The pt says it was a complicated procedure and during the procedure he had to have his "heart shocked once". Post procedure he was seen by cardiology there. An echo revealed an EF of 35%. He apparently had a new LBBB. He had mild Troponin elevation of 0.1. Since that admission he says he has felt fatigued. He has palpitations. He saw Dr Domenic Polite 09/28/14 and is scheduled for a Myoview tomorrow. Yesterday he had "angina" for the time in a long time. He took NTG with relief. He describes mid chest pressure with no radiation. His symptoms are worse when he is recumbent which he says is typical for him. He called the office and was instructed to go to the ED. He has had no further chest pain.    PMHx:  Past Medical History  Diagnosis Date  . Ischemic cardiomyopathy     LVEF 45-50% 11/2011  . Coronary atherosclerosis of native coronary artery     Multivessel status post CABG  . Angioedema     Secondary to ACE inhibitor  . Bell palsy   . Type 2 diabetes mellitus   . AAA (abdominal aortic aneurysm)     Followed by Dr. Donnetta Hutching  . Essential hypertension, benign   . PAD (peripheral artery disease)     Followed by Dr. Donnetta Hutching  . Sleep apnea   . Arthritis   . Carotid artery disease   . Atrial fibrillation   . Myocardial infarction 2001  . Nephrolithiasis   . Hernia of abdominal wall   . Fatigue     Past Surgical History  Procedure  Laterality Date  . Hand surgery  2009  . Cataract extraction w/phaco  03/23/2011    Procedure: CATARACT EXTRACTION PHACO AND INTRAOCULAR LENS PLACEMENT (IOC);  Surgeon: Tonny Branch;  Location: [facility];  Service: Ophthalmology;  Laterality: Left;  CDE: 15.99  . Cataract extraction w/phaco  04/17/2011    Procedure: CATARACT EXTRACTION PHACO AND INTRAOCULAR LENS PLACEMENT (IOC);  Surgeon: Tonny Branch;  Location: [facility];  Service: Ophthalmology;  Laterality: Right;  CDE: 23.45  . Foot surgery Left 1963  . Eye surgery    . Coronary artery bypass graft  2001    LIMA to LAD, SVG to diagonal and ramus, SVG to OM, SVG to AM  . Bypass graft popliteal to popliteal Left 06/08/2014    Procedure: BYPASS GRAFT FEMORAL ARTERY TO ABOVE KNEE POPLITEAL;  Surgeon: Rosetta Posner, MD;  Location: [place];  Service: Vascular;  Laterality: Left;  . Abdominal aortic aneurysm repair  November 19, 2013    [facility] :  Dr. Sammuel Hines  . Abdominal aortic aneurysm repair  09-07-2014    [facility]    SOCHx:  reports that he quit smoking about 7 years ago. His smoking use included Cigarettes. He started  smoking about 35 years ago. He has a 25 pack-year smoking history. He has never used smokeless tobacco. He reports that he does not drink alcohol or use illicit drugs.  FAMHx: Family History  Problem Relation Age of Onset  . Diabetes Mother     Type  I   . Varicose Veins Mother   . Heart disease Father 50    AAA  . AAA (abdominal aortic aneurysm) Father   . Hyperlipidemia Father   . Hypertension Father     ALLERGIES: Allergies  Allergen Reactions  . Ace Inhibitors Anaphylaxis  . Codeine Other (See Comments)     delirium  . Lisinopril Anaphylaxis and Swelling  . Hydromorphone Nausea And Vomiting    ROS: Pertinent items are noted in HPI. see H&P for complete ROS  HOME MEDICATIONS: Prior to Admission medications   Medication Sig Start Date End Date Taking? Authorizing Provider  acetaminophen (TYLENOL) 500 MG  tablet Take 1,000 mg by mouth 2 (two) times daily as needed for headache (pain).   Yes Historical Provider, MD  allopurinol (ZYLOPRIM) 100 MG tablet Take 100 mg by mouth daily with supper.    Yes Historical Provider, MD  aspirin EC 81 MG tablet Take 81 mg by mouth daily.   Yes Historical Provider, MD  carvedilol (COREG) 25 MG tablet Take 0.5 tablets (12.5 mg total) by mouth 2 (two) times daily with a meal. 08/10/14  Yes Satira Sark, MD  furosemide (LASIX) 40 MG tablet Take 40 mg by mouth 2 (two) times daily.    Yes Historical Provider, MD  gabapentin (NEURONTIN) 300 MG capsule Take 300 mg by mouth 2 (two) times daily.   Yes Historical Provider, MD  glipiZIDE (GLUCOTROL XL) 10 MG 24 hr tablet Take 5 mg by mouth 2 (two) times daily.  05/08/14  Yes Historical Provider, MD  isosorbide mononitrate (IMDUR) 60 MG 24 hr tablet Take 2 tablets (120 mg total) by mouth every morning. & 30 mg in the evening Patient taking differently: Take 30-120 mg by mouth 2 (two) times daily. Take 2 tablets (120 mg) every morning and 1/2 tablet (30 mg) with supper 12/29/13  Yes Satira Sark, MD  linagliptin (TRADJENTA) 5 MG TABS tablet Take 5 mg by mouth daily.     Yes Historical Provider, MD  nitroGLYCERIN (NITROSTAT) 0.4 MG SL tablet Place 0.4 mg under the tongue every 5 (five) minutes as needed for chest pain.   Yes Historical Provider, MD  simvastatin (ZOCOR) 40 MG tablet Take 1 tablet (40 mg total) by mouth at bedtime. 02/27/14  Yes Satira Sark, MD    HOSPITAL MEDICATIONS: I have reviewed the patient's current medications.  VITALS: Blood pressure 148/80, pulse 69, temperature 98.2 F (36.8 C), temperature source Oral, resp. rate 16, height 6\' 1"  (1.854 m), weight 222 lb 10.6 oz (101 kg), SpO2 98 %.  PHYSICAL EXAM: General appearance: alert, cooperative and no distress Neck: no carotid bruit and no JVD Lungs: clear to auscultation bilaterally Heart: regular rate and rhythm, S1, S2 normal, no murmur,  click, rub or gallop Abdomen: soft, non-tender; bowel sounds normal; no masses,  no organomegaly and umbilical hernia Extremities: no edema Pulses: diminnished distal pulses Skin: Skin color, texture, turgor normal. No rashes or lesions Neurologic: Grossly normal  LABS: Results for orders placed or performed during the hospital encounter of 09/30/14 (from the past 24 hour(s))  CBC with Differential     Status: Abnormal   Collection Time: 09/30/14  3:12 PM  Result Value Ref Range   WBC 9.7 4.0 - 10.5 K/uL   RBC 3.65 (L) 4.22 - 5.81 MIL/uL   Hemoglobin 10.3 (L) 13.0 - 17.0 g/dL   HCT 32.7 (L) 39.0 - 52.0 %   MCV 89.6 78.0 - 100.0 fL   MCH 28.2 26.0 - 34.0 pg   MCHC 31.5 30.0 - 36.0 g/dL   RDW 15.6 (H) 11.5 - 15.5 %   Platelets 481 (H) 150 - 400 K/uL   Neutrophils Relative % 48 43 - 77 %   Neutro Abs 4.7 1.7 - 7.7 K/uL   Lymphocytes Relative 27 12 - 46 %   Lymphs Abs 2.6 0.7 - 4.0 K/uL   Monocytes Relative 13 (H) 3 - 12 %   Monocytes Absolute 1.3 (H) 0.1 - 1.0 K/uL   Eosinophils Relative 11 (H) 0 - 5 %   Eosinophils Absolute 1.0 (H) 0.0 - 0.7 K/uL   Basophils Relative 1 0 - 1 %   Basophils Absolute 0.1 0.0 - 0.1 K/uL  Comprehensive metabolic panel     Status: Abnormal   Collection Time: 09/30/14  3:12 PM  Result Value Ref Range   Sodium 140 135 - 145 mmol/L   Potassium 3.7 3.5 - 5.1 mmol/L   Chloride 103 101 - 111 mmol/L   CO2 27 22 - 32 mmol/L   Glucose, Bld 88 70 - 99 mg/dL   BUN 18 6 - 20 mg/dL   Creatinine, Ser 1.73 (H) 0.61 - 1.24 mg/dL   Calcium 8.7 (L) 8.9 - 10.3 mg/dL   Total Protein 7.6 6.5 - 8.1 g/dL   Albumin 3.4 (L) 3.5 - 5.0 g/dL   AST 18 15 - 41 U/L   ALT 14 (L) 17 - 63 U/L   Alkaline Phosphatase 67 38 - 126 U/L   Total Bilirubin 0.4 0.3 - 1.2 mg/dL   GFR calc non Af Amer 39 (L) >60 mL/min   GFR calc Af Amer 46 (L) >60 mL/min   Anion gap 10 5 - 15  Troponin I     Status: None   Collection Time: 09/30/14  3:12 PM  Result Value Ref Range   Troponin I  <0.03 <0.031 ng/mL  Glucose, capillary     Status: None   Collection Time: 09/30/14  8:25 PM  Result Value Ref Range   Glucose-Capillary 75 70 - 99 mg/dL  Troponin I-serum (0, 3, 6 hours)     Status: None   Collection Time: 09/30/14  9:43 PM  Result Value Ref Range   Troponin I <0.03 <0.031 ng/mL  Troponin I-serum (0, 3, 6 hours)     Status: None   Collection Time: 10/01/14  1:03 AM  Result Value Ref Range   Troponin I <0.03 <0.031 ng/mL  CBC     Status: Abnormal   Collection Time: 10/01/14  5:34 AM  Result Value Ref Range   WBC 9.0 4.0 - 10.5 K/uL   RBC 3.62 (L) 4.22 - 5.81 MIL/uL   Hemoglobin 10.1 (L) 13.0 - 17.0 g/dL   HCT 32.5 (L) 39.0 - 52.0 %   MCV 89.8 78.0 - 100.0 fL   MCH 27.9 26.0 - 34.0 pg   MCHC 31.1 30.0 - 36.0 g/dL   RDW 15.6 (H) 11.5 - 15.5 %   Platelets 443 (H) 150 - 400 K/uL  Basic metabolic panel     Status: Abnormal   Collection Time: 10/01/14  5:34 AM  Result Value Ref Range   Sodium 140  135 - 145 mmol/L   Potassium 3.6 3.5 - 5.1 mmol/L   Chloride 104 101 - 111 mmol/L   CO2 27 22 - 32 mmol/L   Glucose, Bld 109 (H) 65 - 99 mg/dL   BUN 16 6 - 20 mg/dL   Creatinine, Ser 1.57 (H) 0.61 - 1.24 mg/dL   Calcium 8.5 (L) 8.9 - 10.3 mg/dL   GFR calc non Af Amer 44 (L) >60 mL/min   GFR calc Af Amer 51 (L) >60 mL/min   Anion gap 9 5 - 15  Glucose, capillary     Status: None   Collection Time: 10/01/14  7:16 AM  Result Value Ref Range   Glucose-Capillary 95 65 - 99 mg/dL   Comment 1 Notify RN    Comment 2 Document in Chart     EKG: NSR, PVCs, bigimeny  IMAGING: Dg Chest 2 View  09/30/2014   CLINICAL DATA:  Weakness with mid chest pain  EXAM: CHEST  2 VIEW  COMPARISON:  06/03/2014  FINDINGS: Cardiac shadow remains mildly enlarged. Postsurgical changes are noted. The lungs are well aerated bilaterally without focal infiltrate or sizable effusion. An aortic stent graft is seen.  IMPRESSION: No acute abnormality noted.   Electronically Signed   By: Inez Catalina  M.D.   On: 09/30/2014 16:25    IMPRESSION: Principal Problem:   Unstable angina Active Problems:   Hx of CABG 2001, abnormal but low risk Myoview April 2015   ICM-EF 35% by echo [facility] April 2016   Chronic renal impairment, stage 3 (moderate)   Chronic systolic congestive heart failure   Essential hypertension   Frequent PVCs   Pre-diabetes   Peripheral neuropathy   HLD (hyperlipidemia)   LBBB (left bundle branch block)   PVD - h/o AAA stent graft, popliteal aneurysm repair   RECOMMENDATION: MD to see. Home meds continued. Keep K+ close to 4.0. Proceed with Myoview in am.   Time Spent Directly with Patient: 45 minutes  Erlene Quan [phone] beeper 10/01/2014, 9:47 AM   The patient was seen and examined, and I agree with the assessment and plan as documented above, with modifications as noted below. Medically complex patient with aforementioned history admitted with chest pain and weakness. Saw Dr. Domenic Polite in office on 5/9 with plans for nuclear MPI study. EF noted to decrease by echo at [facility] to 35%. Has ruled out for ACS. Given symptom progression with decrease in LVEF in context of CAD with prior CABG, I recommend he undergo coronary angiography to evaluate for hemodynamically significant stenosis of bypass grafts.  He has CKD and is certainly at risk for contrast-mediated nephropathy, for which gentle pre-cath hydration can be given. Continue ASA, Coreg, Imdur, and statin. Will hold Lasix. Pt is in agreement with this plan.

## 2014-10-01 NOTE — Progress Notes (Signed)
1240 Patient transferred to [place] via stretcher by carelink. Report give to [place], RN with carelink. Tele box and wires removed from patient and central tele notified. IV fluids stopped, IV catheter left intact to LEFT AC. Patient's daughter Cleopatra Cedar notified, no answer, message left.

## 2014-10-01 NOTE — Progress Notes (Signed)
1150 Called report to nurse receiving patient at [place] room# [place].

## 2014-10-02 ENCOUNTER — Inpatient Hospital Stay (HOSPITAL_COMMUNITY): Admission: RE | Admit: 2014-10-02 | Payer: No Typology Code available for payment source | Source: Ambulatory Visit

## 2014-10-02 ENCOUNTER — Encounter (HOSPITAL_COMMUNITY)
Admission: EM | Disposition: A | Discharge status: Home / Self Care | Payer: Medicare Other | Source: Home / Self Care | Attending: Cardiovascular Disease

## 2014-10-02 ENCOUNTER — Inpatient Hospital Stay (HOSPITAL_COMMUNITY): Payer: No Typology Code available for payment source

## 2014-10-02 ENCOUNTER — Inpatient Hospital Stay (HOSPITAL_COMMUNITY): Admission: RE | Admit: 2014-10-02 | Payer: Medicare Other | Source: Ambulatory Visit

## 2014-10-02 DIAGNOSIS — I2510 Atherosclerotic heart disease of native coronary artery without angina pectoris: Secondary | ICD-10-CM | POA: Diagnosis not present

## 2014-10-02 DIAGNOSIS — N183 Chronic kidney disease, stage 3 (moderate): Secondary | ICD-10-CM | POA: Diagnosis not present

## 2014-10-02 DIAGNOSIS — I10 Essential (primary) hypertension: Secondary | ICD-10-CM | POA: Diagnosis not present

## 2014-10-02 HISTORY — PX: CARDIAC CATHETERIZATION: SHX172

## 2014-10-02 LAB — CBC
HCT: 31.7 % — ABNORMAL LOW (ref 39.0–52.0)
Hemoglobin: 9.9 g/dL — ABNORMAL LOW (ref 13.0–17.0)
MCH: 27.3 pg (ref 26.0–34.0)
MCHC: 31.2 g/dL (ref 30.0–36.0)
MCV: 87.3 fL (ref 78.0–100.0)
Platelets: 401 10*3/uL — ABNORMAL HIGH (ref 150–400)
RBC: 3.63 MIL/uL — ABNORMAL LOW (ref 4.22–5.81)
RDW: 15.8 % — ABNORMAL HIGH (ref 11.5–15.5)
WBC: 9.3 10*3/uL (ref 4.0–10.5)

## 2014-10-02 LAB — GLUCOSE, CAPILLARY
Glucose-Capillary: 111 mg/dL — ABNORMAL HIGH (ref 65–99)
Glucose-Capillary: 108 mg/dL — ABNORMAL HIGH (ref 65–99)
Glucose-Capillary: 122 mg/dL — ABNORMAL HIGH (ref 65–99)
Glucose-Capillary: 103 mg/dL — ABNORMAL HIGH (ref 65–99)

## 2014-10-02 LAB — BASIC METABOLIC PANEL
ANION GAP: 9 (ref 5–15)
BUN: 17 mg/dL (ref 6–20)
CO2: 25 mmol/L (ref 22–32)
Calcium: 8.7 mg/dL — ABNORMAL LOW (ref 8.9–10.3)
Chloride: 107 mmol/L (ref 101–111)
Creatinine, Ser: 1.55 mg/dL — ABNORMAL HIGH (ref 0.61–1.24)
GFR calc Af Amer: 52 mL/min — ABNORMAL LOW (ref >60)
GFR calc non Af Amer: 45 mL/min — ABNORMAL LOW (ref >60)
Glucose, Bld: 89 mg/dL (ref 65–99)
Potassium: 4.2 mmol/L (ref 3.5–5.1)
SODIUM: 141 mmol/L (ref 135–145)

## 2014-10-02 LAB — HEMOGLOBIN A1C
Hgb A1c MFr Bld: 6.4 % — ABNORMAL HIGH (ref 4.8–5.6)
Mean Plasma Glucose: 137 mg/dL

## 2014-10-02 LAB — PROTIME-INR
INR: 1.04 (ref 0.00–1.49)
PROTHROMBIN TIME: 13.8 s (ref 11.6–15.2)

## 2014-10-02 SURGERY — LEFT HEART CATH AND CORS/GRAFTS ANGIOGRAPHY
Anesthesia: LOCAL

## 2014-10-02 MED ORDER — ONDANSETRON HCL 4 MG/2ML IJ SOLN: 4.0000 mg | Freq: Four times a day (QID) | INTRAMUSCULAR | Status: DC | PRN

## 2014-10-02 MED ORDER — VERAPAMIL HCL 2.5 MG/ML IV SOLN
INTRAVENOUS | Status: AC
  Filled 2014-10-02: qty 2

## 2014-10-02 MED ORDER — HEPARIN SODIUM (PORCINE) 5000 UNIT/ML IJ SOLN
5000.0000 [IU] | Freq: Three times a day (TID) | INTRAMUSCULAR | Status: DC
  Administered 2014-10-03: 5000 [IU] via SUBCUTANEOUS
  Filled 2014-10-02 (×3): qty 1

## 2014-10-02 MED ORDER — MIDAZOLAM HCL 2 MG/2ML IJ SOLN
INTRAMUSCULAR | Status: DC | PRN
  Administered 2014-10-02: 1 mg via INTRAVENOUS

## 2014-10-02 MED ORDER — HEPARIN (PORCINE) IN NACL 2-0.9 UNIT/ML-% IJ SOLN
INTRAMUSCULAR | Status: AC
  Filled 2014-10-02: qty 1000

## 2014-10-02 MED ORDER — SODIUM CHLORIDE 0.9 % IJ SOLN
3.0000 mL | INTRAMUSCULAR | Status: DC | PRN
Start: 2014-10-02 — End: 2014-10-03

## 2014-10-02 MED ORDER — MIDAZOLAM HCL 2 MG/2ML IJ SOLN
INTRAMUSCULAR | Status: AC
Start: 2014-10-02 — End: 2014-10-02
  Filled 2014-10-02: qty 2

## 2014-10-02 MED ORDER — IOHEXOL 350 MG/ML SOLN
INTRAVENOUS | Status: DC | PRN
  Administered 2014-10-02: 135 mL via INTRA_ARTERIAL

## 2014-10-02 MED ORDER — FENTANYL CITRATE (PF) 100 MCG/2ML IJ SOLN
INTRAMUSCULAR | Status: DC | PRN
  Administered 2014-10-02: 50 ug via INTRAVENOUS

## 2014-10-02 MED ORDER — SODIUM CHLORIDE 0.9 % WEIGHT BASED INFUSION: 1.0000 mL/kg/h | INTRAVENOUS | Status: AC

## 2014-10-02 MED ORDER — HEPARIN SODIUM (PORCINE) 1000 UNIT/ML IJ SOLN
INTRAMUSCULAR | Status: DC | PRN
  Administered 2014-10-02: 5000 [IU] via INTRAVENOUS

## 2014-10-02 MED ORDER — NITROGLYCERIN 1 MG/10 ML FOR IR/CATH LAB
INTRA_ARTERIAL | Status: AC
Start: 2014-10-02 — End: 2014-10-02
  Filled 2014-10-02: qty 10

## 2014-10-02 MED ORDER — LIDOCAINE HCL (PF) 1 % IJ SOLN
INTRAMUSCULAR | Status: AC
  Filled 2014-10-02: qty 30

## 2014-10-02 MED ORDER — SODIUM CHLORIDE 0.9 % IJ SOLN
3.0000 mL | Freq: Two times a day (BID) | INTRAMUSCULAR | Status: DC
  Administered 2014-10-02 – 2014-10-03 (×2): 3 mL via INTRAVENOUS

## 2014-10-02 MED ORDER — HEPARIN SODIUM (PORCINE) 1000 UNIT/ML IJ SOLN
INTRAMUSCULAR | Status: AC
  Filled 2014-10-02: qty 1

## 2014-10-02 MED ORDER — VERAPAMIL HCL 2.5 MG/ML IV SOLN
INTRAVENOUS | Status: DC | PRN
  Administered 2014-10-02: 13:00:00 via INTRA_ARTERIAL

## 2014-10-02 MED ORDER — ACETAMINOPHEN 325 MG PO TABS: 650.0000 mg | ORAL_TABLET | ORAL | Status: DC | PRN

## 2014-10-02 MED ORDER — SODIUM CHLORIDE 0.9 % IV SOLN: 250.0000 mL | INTRAVENOUS | Status: DC | PRN

## 2014-10-02 MED ORDER — FENTANYL CITRATE (PF) 100 MCG/2ML IJ SOLN
INTRAMUSCULAR | Status: AC
  Filled 2014-10-02: qty 2

## 2014-10-02 SURGICAL SUPPLY — 10 items
CATH INFINITI 5 FR JL3.5 (CATHETERS) IMPLANT
CATH INFINITI 5FR JL4 (CATHETERS) ×2 IMPLANT
CATH INFINITI JR4 5F (CATHETERS) ×2 IMPLANT
DEVICE RAD COMP TR BAND LRG (VASCULAR PRODUCTS) ×2 IMPLANT
GLIDESHEATH SLEND A-KIT 6F 22G (SHEATH) ×2 IMPLANT
KIT HEART LEFT (KITS) ×2 IMPLANT
PACK CARDIAC CATHETERIZATION (CUSTOM PROCEDURE TRAY) ×2 IMPLANT
TRANSDUCER W/STOPCOCK (MISCELLANEOUS) ×2 IMPLANT
TUBING CIL FLEX 10 FLL-RA (TUBING) ×2 IMPLANT
WIRE SAFE-T 1.5MM-J .035X260CM (WIRE) ×2 IMPLANT

## 2014-10-02 NOTE — Interval H&P Note (Signed)
Cath Lab Visit (complete for each Cath Lab visit)  Clinical Evaluation Leading to the Procedure:   ACS: No.  Non-ACS:    Anginal Classification: CCS III  Anti-ischemic medical therapy: Maximal Therapy (2 or more classes of medications)  Non-Invasive Test Results: No non-invasive testing performed  Prior CABG: Previous CABG      History and Physical Interval Note:  10/02/2014 12:47 PM  [name]  has presented today for surgery, with the diagnosis of unstable angina  The various methods of treatment have been discussed with the patient and family. After consideration of risks, benefits and other options for treatment, the patient has consented to  Procedure(s): Left Heart Cath and Cors/Grafts Angiography (N/A) as a surgical intervention .  The patient's history has been reviewed, patient examined, no change in status, stable for surgery.  I have reviewed the patient's chart and labs.  Questions were answered to the patient's satisfaction.     Sinclair Grooms

## 2014-10-02 NOTE — Progress Notes (Signed)
Pt states he has been short of breath at rest off and on, especially when laying down. Pt O2 sat 97% on RA, lung sounds clear in all fields. Pt states oxygen helps his SOB, 2Lnc hooked up PRN.

## 2014-10-02 NOTE — H&P (View-Only) (Signed)
Patient Name: [name] Date of Encounter: 10/02/2014     Principal Problem:   Unstable angina Active Problems:   Hx of CABG 2001, abnormal but low risk Myoview April 2015   ICM-EF 35% by echo [facility] April 2016   Chronic renal impairment, stage 3 (moderate)   Pre-diabetes   Chronic systolic congestive heart failure   Peripheral neuropathy   Essential hypertension   HLD (hyperlipidemia)   Frequent PVCs   LBBB (left bundle branch block)   PVD - h/o AAA stent graft, popliteal aneurysm repair    SUBJECTIVE  No more chest pain. Awaiting cardiac cath.   CURRENT MEDS . allopurinol  100 mg Oral Q supper  . aspirin EC  81 mg Oral Daily  . carvedilol  12.5 mg Oral BID WC  . gabapentin  300 mg Oral BID  . glipiZIDE  5 mg Oral BID WC  . heparin  5,000 Units Subcutaneous 3 times per day  . insulin aspart  0-9 Units Subcutaneous TID WC  . isosorbide mononitrate  120 mg Oral q morning - 10a  . linagliptin  5 mg Oral Daily  . simvastatin  40 mg Oral QHS  . sodium chloride  3 mL Intravenous Q12H  . sodium chloride  3 mL Intravenous Q12H    OBJECTIVE  Filed Vitals:   10/01/14 2218 10/02/14 0203 10/02/14 0600 10/02/14 0747  BP: 147/71 139/68 134/55 141/62  Pulse: 63 71 74 68  Temp: 97.8 F (36.6 C) 98.4 F (36.9 C) 97.7 F (36.5 C)   TempSrc: Oral Oral Oral   Resp: 18 18 20    Height:      Weight:   225 lb 12.8 oz (102.422 kg)   SpO2: 98% 99% 98% 98%    Intake/Output Summary (Last 24 hours) at 10/02/14 1056 Last data filed at 10/02/14 1000  Gross per 24 hour  Intake    840 ml  Output   2150 ml  Net  -1310 ml   Filed Weights   09/30/14 2242 10/01/14 1337 10/02/14 0600  Weight: 222 lb 10.6 oz (101 kg) 226 lb 3.2 oz (102.604 kg) 225 lb 12.8 oz (102.422 kg)    PHYSICAL EXAM  General: Pleasant, NAD. Neuro: Alert and oriented X 3. Moves all extremities spontaneously. Psych: Normal affect. HEENT:  Normal  Neck: Supple without bruits or JVD. Lungs:  Resp  regular and unlabored, CTA. Heart: RRR no s3, s4, or murmurs. Abdomen: Soft, non-tender, non-distended, BS + x 4.  Extremities: No clubbing, cyanosis or edema. DP/PT/Radials 2+ and equal bilaterally.  Accessory Clinical Findings  CBC  Recent Labs  09/30/14 1512 10/01/14 0534 10/02/14 0335  WBC 9.7 9.0 9.3  NEUTROABS 4.7  --   --   HGB 10.3* 10.1* 9.9*  HCT 32.7* 32.5* 31.7*  MCV 89.6 89.8 87.3  PLT 481* 443* 123XX123*   Basic Metabolic Panel  Recent Labs  10/01/14 0534 10/02/14 0818  NA 140 141  K 3.6 4.2  CL 104 107  CO2 27 25  GLUCOSE 109* 89  BUN 16 17  CREATININE 1.57* 1.55*  CALCIUM 8.5* 8.7*   Liver Function Tests  Recent Labs  09/30/14 1512  AST 18  ALT 14*  ALKPHOS 67  BILITOT 0.4  PROT 7.6  ALBUMIN 3.4*   No results for input(s): LIPASE, AMYLASE in the last 72 hours. Cardiac Enzymes  Recent Labs  09/30/14 1512 09/30/14 2143 10/01/14 0103  TROPONINI <0.03 <0.03 <0.03   Hemoglobin A1C  Recent Labs  09/30/14 1528  HGBA1C 6.4*    TELE  NSR some PVCs  Radiology/Studies  Dg Chest 2 View  09/30/2014   CLINICAL DATA:  Weakness with mid chest pain  EXAM: CHEST  2 VIEW  COMPARISON:  06/03/2014  FINDINGS: Cardiac shadow remains mildly enlarged. Postsurgical changes are noted. The lungs are well aerated bilaterally without focal infiltrate or sizable effusion. An aortic stent graft is seen.  IMPRESSION: No acute abnormality noted.   Electronically Signed   By: Inez Catalina M.D.   On: 09/30/2014 16:25    ASSESSMENT AND PLAN  [name] is a 69 y.o. male with a history of CAD s/p CABG (2001), ischemic CM EF 35%, AAA s/p aortic stent graft (11/2013) s/p repair for endoleak (08/2014), OSA, HTN, HLD, CKD and PAF who presented to [facility] for chest pain and was transferred to [facility] 10/01/14 for prehydration and coronary angiography.   Chest pain- Saw Dr. Domenic Polite in office on 5/9 with plans for nuclear MPI study. EF noted to decrease by echo at [facility] to  35%. Has ruled out for ACS. Given symptom progression with decrease in LVEF in context of CAD with prior CABG, Dr. Bronson Ing recommend he undergo coronary angiography to evaluate for hemodynamically significant stenosis of bypass grafts. He has CKD and is certainly at risk for contrast-mediated nephropathy, for which gentle pre-cath hydration can be given. -- Continue ASA, Coreg, Imdur, and statin. Lasix held. -- PLan for LHC today with Dr Tamala Julian at 12pm  CKD-creat stable at 1.55. Continue to monitor  Ischemic CM - EF 35%- appears euvolemic. -- Lasix held and given hydration prior to cath. Carefully monitor to obviate CHF  Signed, Eileen Stanford PA-C  Pager [phone]    I have examined the patient and reviewed assessment and plan and discussed with patient.  Agree with above as stated.  Plan cath.  Known PAD.  Consider left radial approach.    Austin Sheperd S.

## 2014-10-02 NOTE — Progress Notes (Signed)
Patient Name: [name] Date of Encounter: 10/02/2014     Principal Problem:   Unstable angina Active Problems:   Hx of CABG 2001, abnormal but low risk Myoview April 2015   ICM-EF 35% by echo [facility] April 2016   Chronic renal impairment, stage 3 (moderate)   Pre-diabetes   Chronic systolic congestive heart failure   Peripheral neuropathy   Essential hypertension   HLD (hyperlipidemia)   Frequent PVCs   LBBB (left bundle branch block)   PVD - h/o AAA stent graft, popliteal aneurysm repair    SUBJECTIVE  No more chest pain. Awaiting cardiac cath.   CURRENT MEDS . allopurinol  100 mg Oral Q supper  . aspirin EC  81 mg Oral Daily  . carvedilol  12.5 mg Oral BID WC  . gabapentin  300 mg Oral BID  . glipiZIDE  5 mg Oral BID WC  . heparin  5,000 Units Subcutaneous 3 times per day  . insulin aspart  0-9 Units Subcutaneous TID WC  . isosorbide mononitrate  120 mg Oral q morning - 10a  . linagliptin  5 mg Oral Daily  . simvastatin  40 mg Oral QHS  . sodium chloride  3 mL Intravenous Q12H  . sodium chloride  3 mL Intravenous Q12H    OBJECTIVE  Filed Vitals:   10/01/14 2218 10/02/14 0203 10/02/14 0600 10/02/14 0747  BP: 147/71 139/68 134/55 141/62  Pulse: 63 71 74 68  Temp: 97.8 F (36.6 C) 98.4 F (36.9 C) 97.7 F (36.5 C)   TempSrc: Oral Oral Oral   Resp: 18 18 20    Height:      Weight:   225 lb 12.8 oz (102.422 kg)   SpO2: 98% 99% 98% 98%    Intake/Output Summary (Last 24 hours) at 10/02/14 1056 Last data filed at 10/02/14 1000  Gross per 24 hour  Intake    840 ml  Output   2150 ml  Net  -1310 ml   Filed Weights   09/30/14 2242 10/01/14 1337 10/02/14 0600  Weight: 222 lb 10.6 oz (101 kg) 226 lb 3.2 oz (102.604 kg) 225 lb 12.8 oz (102.422 kg)    PHYSICAL EXAM  General: Pleasant, NAD. Neuro: Alert and oriented X 3. Moves all extremities spontaneously. Psych: Normal affect. HEENT:  Normal  Neck: Supple without bruits or JVD. Lungs:  Resp  regular and unlabored, CTA. Heart: RRR no s3, s4, or murmurs. Abdomen: Soft, non-tender, non-distended, BS + x 4.  Extremities: No clubbing, cyanosis or edema. DP/PT/Radials 2+ and equal bilaterally.  Accessory Clinical Findings  CBC  Recent Labs  09/30/14 1512 10/01/14 0534 10/02/14 0335  WBC 9.7 9.0 9.3  NEUTROABS 4.7  --   --   HGB 10.3* 10.1* 9.9*  HCT 32.7* 32.5* 31.7*  MCV 89.6 89.8 87.3  PLT 481* 443* 123XX123*   Basic Metabolic Panel  Recent Labs  10/01/14 0534 10/02/14 0818  NA 140 141  K 3.6 4.2  CL 104 107  CO2 27 25  GLUCOSE 109* 89  BUN 16 17  CREATININE 1.57* 1.55*  CALCIUM 8.5* 8.7*   Liver Function Tests  Recent Labs  09/30/14 1512  AST 18  ALT 14*  ALKPHOS 67  BILITOT 0.4  PROT 7.6  ALBUMIN 3.4*   No results for input(s): LIPASE, AMYLASE in the last 72 hours. Cardiac Enzymes  Recent Labs  09/30/14 1512 09/30/14 2143 10/01/14 0103  TROPONINI <0.03 <0.03 <0.03   Hemoglobin A1C  Recent Labs  09/30/14 1528  HGBA1C 6.4*    TELE  NSR some PVCs  Radiology/Studies  Dg Chest 2 View  09/30/2014   CLINICAL DATA:  Weakness with mid chest pain  EXAM: CHEST  2 VIEW  COMPARISON:  06/03/2014  FINDINGS: Cardiac shadow remains mildly enlarged. Postsurgical changes are noted. The lungs are well aerated bilaterally without focal infiltrate or sizable effusion. An aortic stent graft is seen.  IMPRESSION: No acute abnormality noted.   Electronically Signed   By: Inez Catalina M.D.   On: 09/30/2014 16:25    ASSESSMENT AND PLAN  [name] is a 69 y.o. male with a history of CAD s/p CABG (2001), ischemic CM EF 35%, AAA s/p aortic stent graft (11/2013) s/p repair for endoleak (08/2014), OSA, HTN, HLD, CKD and PAF who presented to [facility] for chest pain and was transferred to [facility] 10/01/14 for prehydration and coronary angiography.   Chest pain- Saw Dr. Domenic Polite in office on 5/9 with plans for nuclear MPI study. EF noted to decrease by echo at [facility] to  35%. Has ruled out for ACS. Given symptom progression with decrease in LVEF in context of CAD with prior CABG, Dr. Bronson Ing recommend he undergo coronary angiography to evaluate for hemodynamically significant stenosis of bypass grafts. He has CKD and is certainly at risk for contrast-mediated nephropathy, for which gentle pre-cath hydration can be given. -- Continue ASA, Coreg, Imdur, and statin. Lasix held. -- PLan for LHC today with Dr Tamala Julian at 12pm  CKD-creat stable at 1.55. Continue to monitor  Ischemic CM - EF 35%- appears euvolemic. -- Lasix held and given hydration prior to cath. Carefully monitor to obviate CHF  Signed, Eileen Stanford PA-C  Pager [phone]    I have examined the patient and reviewed assessment and plan and discussed with patient.  Agree with above as stated.  Plan cath.  Known PAD.  Consider left radial approach.    Murel Shenberger S.

## 2014-10-02 NOTE — Care Management Note (Signed)
Case Management Note  Patient Details  Name: [name] MRN: [id number] Date of Birth: 1945-10-11  Subjective/Objective:                 Pt admitted on 09/30/14 with unstable angina.  PTA, pt independent, lives alone.   Action/Plan: Will follow for discharge needs as pt progresses.    Expected Discharge Date:  10/01/14               Expected Discharge Plan:  Home/Self Care  In-House Referral:     Discharge planning Services  CM Consult  Post Acute Care Choice:    Choice offered to:     DME Arranged:    DME Agency:     HH Arranged:    HH Agency:     Status of Service:  In process, will continue to follow  Medicare Important Message Given:    Date Medicare IM Given:    Medicare IM give by:    Date Additional Medicare IM Given:    Additional Medicare Important Message give by:     If discussed at [place] of Stay Meetings, dates discussed:    Additional Comments:  Ella Bodo, RN 10/02/2014, 4:41 PM [phone]

## 2014-10-03 DIAGNOSIS — I200 Unstable angina: Secondary | ICD-10-CM | POA: Diagnosis not present

## 2014-10-03 LAB — GLUCOSE, CAPILLARY
GLUCOSE-CAPILLARY: 95 mg/dL (ref 65–99)
Glucose-Capillary: 98 mg/dL (ref 65–99)

## 2014-10-03 NOTE — Progress Notes (Signed)
Pt discharge education and instructions completed with pt at bedside and pt voices understanding denying any questions. Pt IV and telemetry removed; pt discharge home with family to transport him home. Pt transported off unit via wheelchair with belongings to the side. Francis Gaines Anida Deol RN.

## 2014-10-03 NOTE — Discharge Summary (Signed)
Physician Discharge Summary  Patient ID: [name] MRN: [id number] DOB/AGE: March 26, 1946 69 y.o.  Admit date: 09/30/2014 Discharge date: 10/03/2014  Primary Discharge Diagnosis 1. CAD 2. ICM-EF of 35% 3. Chronic Systolic CHF  Secondary Discharge Diagnosis 1. PVD: H/O AAA stent graft 2. CKD Stage 3.   Primary Cardiologist: Rozann Lesches MD [facility] office)  Significant Diagnostic Studies:  1. Cardiac Cath 10/02/2014-  Bypass graft failure with total occlusion of the saphenous vein graft to the acute marginal of the right coronary, total occlusion of the sequential saphenous vein graft to the diagonal and ramus intermedius, and total occlusion of the saphenous graft to the distal circumflex/obtuse marginal.  Patent LIMA to the LAD.  Total occlusion of the native LAD, the mid right coronary, second obtuse marginal, the first diagonal, and the ramus intermedius.  The distal right coronary, the obtuse marginal, ramus intermedius are collateralized from the native circulation via the LIMA to LAD.  Severe left ventricular systolic dysfunction with an inferobasal aneurysm, and an ejection fraction of 20-25%.  RECOMMENDATIONS:   Medical therapy for heart failure including consideration of device therapy to prolong survival.  Medical therapy for chronic ischemia as tolerated by blood pressure.  With the patent LIMA to LAD and severe LV systolic dysfunction, repeat surgical would be high risk and does not seem prudent.       Wall Motion       There is a inferobasal aneurysm. The LV is dilated. There is decreased LV function with an estimated ejection fraction of 20%            Coronary Diagrams    Diagnostic Diagram                Consults: None  Hospital Course:        [name] is a 69 year old male patient who is admitted with bradycardia and chest pain. The patient has a known history of coronary artery disease with  coronary artery bypass grafting, ischemic heart mildly with an EF of 35%, hypertension, chronic left bundle-branch block, and chronic renal disease, stage III. The pain was described as a tightness sensation substernally to the left chest without radiation. Due to significant cardiac history. The patient was scheduled for cardiac catheterization.     Cardiac catheterization was completed by Dr. Irish Lack on 10/02/2014 , With results as described above. This felt that the patient was to be treated medically without any intervention. A repeat surgical intervention would be high risk and did not seem prudent. The patient will be continued on his preadmission medications.     He was seen and examined by Dr. Dola Argyle on day of discharge and found to be stable. He had no further complaints of chest pain. Per Dr. Ron Parker notes. The patient may need more aggressive CHF management. He was given instructions concerning. Daily weights and salt restriction. Consideration for ICD implantation will be discussed. On follow-up appointment with his history of ischemic cardiopathy. An EF of 35%. He also may be a candidate to be evaluated further by the CHF clinic. This is all deferred to follow-up appointment with Dr. Domenic Polite. Discharge weight was 226 pounds. He will have close follow-up post hospitalization for ongoing management.  Discharge Exam: Blood pressure 129/63, pulse 67, temperature 98.5 F (36.9 C), temperature source Oral, resp. rate 20, height 6\' 1"  (1.854 m), weight 226 lb 3.2 oz (102.604 kg), SpO2 96 %.  Labs:   Lab Results  Component Value Date   WBC  9.3 10/02/2014   HGB 9.9* 10/02/2014   HCT 31.7* 10/02/2014   MCV 87.3 10/02/2014   PLT 401* 10/02/2014    Recent Labs Lab 09/30/14 1512  10/02/14 0818  NA 140  < > 141  K 3.7  < > 4.2  CL 103  < > 107  CO2 27  < > 25  BUN 18  < > 17  CREATININE 1.73*  < > 1.55*  CALCIUM 8.7*  < > 8.7*  PROT 7.6  --   --   BILITOT 0.4  --   --   ALKPHOS 67   --   --   ALT 14*  --   --   AST 18  --   --   GLUCOSE 88  < > 89  < > = values in this interval not displayed. Lab Results  Component Value Date   TROPONINI <0.03 10/01/2014   No results found for: CHOL No results found for: HDL No results found for: LDLCALC No results found for: TRIG No results found for: CHOLHDL No results found for: LDLDIRECT    Radiology: Dg Chest 2 View  09/30/2014   CLINICAL DATA:  Weakness with mid chest pain  EXAM: CHEST  2 VIEW  COMPARISON:  06/03/2014  FINDINGS: Cardiac shadow remains mildly enlarged. Postsurgical changes are noted. The lungs are well aerated bilaterally without focal infiltrate or sizable effusion. An aortic stent graft is seen.  IMPRESSION: No acute abnormality noted.   Electronically Signed   By: Inez Catalina M.D.   On: 09/30/2014 16:25    EKG: SR with LBBB  FOLLOW UP PLANS AND APPOINTMENTS     Discharge Instructions    Diet - low sodium heart healthy    Complete by:  As directed      Increase activity slowly    Complete by:  As directed             Medication List    TAKE these medications        acetaminophen 500 MG tablet  Commonly known as:  TYLENOL  Take 1,000 mg by mouth 2 (two) times daily as needed for headache (pain).     allopurinol 100 MG tablet  Commonly known as:  ZYLOPRIM  Take 100 mg by mouth daily with supper.     aspirin EC 81 MG tablet  Take 81 mg by mouth daily.     carvedilol 25 MG tablet  Commonly known as:  COREG  Take 0.5 tablets (12.5 mg total) by mouth 2 (two) times daily with a meal.     furosemide 40 MG tablet  Commonly known as:  LASIX  Take 40 mg by mouth 2 (two) times daily.     gabapentin 300 MG capsule  Commonly known as:  NEURONTIN  Take 300 mg by mouth 2 (two) times daily.     glipiZIDE 10 MG 24 hr tablet  Commonly known as:  GLUCOTROL XL  Take 5 mg by mouth 2 (two) times daily.     isosorbide mononitrate 60 MG 24 hr tablet  Commonly known as:  IMDUR  Take 2 tablets  (120 mg total) by mouth every morning. & 30 mg in the evening     linagliptin 5 MG Tabs tablet  Commonly known as:  TRADJENTA  Take 5 mg by mouth daily.     nitroGLYCERIN 0.4 MG SL tablet  Commonly known as:  NITROSTAT  Place 0.4 mg under the tongue every 5 (five) minutes  as needed for chest pain.     simvastatin 40 MG tablet  Commonly known as:  ZOCOR  Take 1 tablet (40 mg total) by mouth at bedtime.       Follow-up Information    Follow up with Rozann Lesches, MD.   Specialty:  Cardiology   Why:  Our office will call you for appt   Contact information:   [place] [address] [phone]        Time spent with patient to include physician time:35 minutes Signed: Phill Myron. Lawrence NP [place]  10/03/2014, 12:32 PM Patient seen and examined. I agree with the assessment and plan as detailed above. See also my additional thoughts below.   Please refer to my progress note from today. I made the decision to send the patient home. I agree with the assessment above and all the plans.  Dola Argyle, MD, [facility] 10/03/2014 1:13 PM

## 2014-10-03 NOTE — Progress Notes (Signed)
SUBJECTIVE:  The patient is stable today after catheterization. This was done from the left radial in the area looks good. He has been walking in the hall. He has rare shortness of breath when walking. Medical therapy of coronary disease is recommended. He may need more advanced therapy for his CHF.   Filed Vitals:   10/02/14 1700 10/02/14 2055 10/03/14 0502 10/03/14 0830  BP: 133/78 122/53 142/67 129/63  Pulse: 62 62 73 67  Temp: 97.6 F (36.4 C) 98.6 F (37 C) 98.1 F (36.7 C) 98.5 F (36.9 C)  TempSrc: Oral Oral Oral Oral  Resp: _0 Height:      Weight:   226 lb 3.2 oz (102.604 kg)   SpO2: 96% 98% 94% 96%     Intake/Output Summary (Last 24 hours) at 10/03/14 1057 Last data filed at 10/03/14 0400  Gross per 24 hour  Intake    720 ml  Output   1326 ml  Net   -606 ml    LABS: Basic Metabolic Panel:  Recent Labs  10/01/14 0534 10/02/14 0818  NA 140 141  K 3.6 4.2  CL 104 107  CO2 27 25  GLUCOSE 109* 89  BUN 16 17  CREATININE 1.57* 1.55*  CALCIUM 8.5* 8.7*   Liver Function Tests:  Recent Labs  09/30/14 1512  AST 18  ALT 14*  ALKPHOS 67  BILITOT 0.4  PROT 7.6  ALBUMIN 3.4*   No results for input(s): LIPASE, AMYLASE in the last 72 hours. CBC:  Recent Labs  09/30/14 1512 10/01/14 0534 10/02/14 0335  WBC 9.7 9.0 9.3  NEUTROABS 4.7  --   --   HGB 10.3* 10.1* 9.9*  HCT 32.7* 32.5* 31.7*  MCV 89.6 89.8 87.3  PLT 481* 443* 401*   Cardiac Enzymes:  Recent Labs  09/30/14 1512 09/30/14 2143 10/01/14 0103  TROPONINI <0.03 <0.03 <0.03   BNP: Invalid input(s): POCBNP D-Dimer: No results for input(s): DDIMER in the last 72 hours. Hemoglobin A1C:  Recent Labs  09/30/14 1528  HGBA1C 6.4*   Fasting Lipid Panel: No results for input(s): CHOL, HDL, LDLCALC, TRIG, CHOLHDL, LDLDIRECT in the last 72 hours. Thyroid Function Tests: No results for input(s): TSH, T4TOTAL, T3FREE, THYROIDAB in the last 72 hours.  Invalid input(s):  FREET3  RADIOLOGY: Dg Chest 2 View  09/30/2014   CLINICAL DATA:  Weakness with mid chest pain  EXAM: CHEST  2 VIEW  COMPARISON:  06/03/2014  FINDINGS: Cardiac shadow remains mildly enlarged. Postsurgical changes are noted. The lungs are well aerated bilaterally without focal infiltrate or sizable effusion. An aortic stent graft is seen.  IMPRESSION: No acute abnormality noted.   Electronically Signed   By: Inez Catalina M.D.   On: 09/30/2014 16:25     Cardiac catheterization  Order# [id number]   Reading physician: Belva Crome, MD  Ordering physician: Belva Crome, MD  Study date: 10/02/14      Physicians    Panel Physicians Referring Physician Case Authorizing Physician   Belva Crome, MD (Primary)      Procedures    Left Heart Cath and Cors/Grafts Angiography    PACS Images    Show images for Cardiac catheterization     Link to Procedure Log    Procedure Log      Indications    Coronary artery disease involving coronary bypass graft of native heart with other forms of angina pectoris [I25.708 (ICD-10-CM)]    Technique and  Indications    The left radial was sterilely prepped and draped. Lidocaine was used for local anesthesia. A 5 French Slender sheath was inserted into the left radial using the modified Seldinger technique via an Angiocath stick of the radial. Verapamil was administered intra-arterially. Coronary angiography was then performed using a JR4. This catheter was used for bypass graft angiography and native right coronary angiography. He was also used for internal mammary artery angiography. A JL4 was used for left coronary angiography. The JR4 was used for left ventriculography using hand injection.  After review of the digital images are case was terminated. Previous catheterizations were retrieved from archives and were reviewed.  Hemostasis was achieved and the left radial with a wrist band.There were no immediate complications during the procedure.     Conclusion     Bypass graft failure with total occlusion of the saphenous vein graft to the acute marginal of the right coronary, total occlusion of the sequential saphenous vein graft to the diagonal and ramus intermedius, and total occlusion of the saphenous graft to the distal circumflex/obtuse marginal.  Patent LIMA to the LAD.  Total occlusion of the native LAD, the mid right coronary, second obtuse marginal, the first diagonal, and the ramus intermedius.  The distal right coronary, the obtuse marginal, ramus intermedius are collateralized from the native circulation via the LIMA to LAD.  Severe left ventricular systolic dysfunction with an inferobasal aneurysm, and an ejection fraction of 20-25%.  RECOMMENDATIONS:   Medical therapy for heart failure including consideration of device therapy to prolong survival.  Medical therapy for chronic ischemia as tolerated by blood pressure.  With the patent LIMA to LAD and severe LV systolic dysfunction, repeat surgical would be high risk and does not seem prudent.    Coronary Findings    Dominance: Right   Left Anterior Descending   . Prox LAD lesion, 100% stenosed.     Ramus Intermedius  Ramus filled by collaterals from 1st Mrg.   . Ramus lesion, 100% stenosed. diffuse .     Left Circumflex   . Second Obtuse Marginal Branch   2nd Mrg filled by collaterals from 1st Mrg.   . 2nd Mrg lesion, 100% stenosed.     Right Coronary Artery  The vessel , is small . Dist RCA filled by collaterals from Prox RCA.   . Mid RCA lesion, 100% stenosed. The lesion is type C, calcified, diffuse .   Marland Kitchen Acute Marginal Branch   Acute Mrg filled by collaterals from Prox RCA.   . Right Posterior Descending Artery   RPDA filled by collaterals from Dist LAD.   Marland Kitchen Second Right Posterolateral   2nd RPLB filled by collaterals from Dist LAD.     Graft Angiography    Free LIMA Graft to Dist LAD  LIMA was injected is normal in caliber.      Sequential single graft Graft to 1st Diag, Ramus  SVG was injected .   Marland Kitchen Origin to Mid Graft lesion before 1st Diag, 100% stenosed.     Free Graft to 2nd Mrg  SVG was injected .   Marland Kitchen Origin to Prox Graft lesion, 100% stenosed.     Free Graft to Acute Mrg  SVG   . Prox Graft to Mid Graft lesion, 100% stenosed.          Wall Motion       There is a inferobasal aneurysm. The LV is dilated. There is decreased LV function with an estimated ejection fraction  of 20%            Coronary Diagrams    Diagnostic Diagram            Supplies    Name ID Temporary Type Charge Code Description Charge Code Quantity   TRANSDUCER W/STOPCOCK 35009 No MISCELLANEOUS   1   KIT HEART LEFT NAMIC CV LAB 38182 No KITS   1   TRAY CARDIAC CATHETERIZATION 50307 No CUSTOM PROCEDURE TRAY HC SUPPLY STERILE 544287 1   TUBING FLEXCIL INJ LINE 10" 50627 No TUBING   1   TR BAND LONG 54947 No VASCULAR PRODUCTS HC SUPPLY GUIDE WIRES 154828 1   CATH INFINITI JR4 12F 62530 No CATHETERS HC SUPPLY CATHETER DIAGNOSTIC 156060 1   CATH INFINITI 12FR JL4 62645 No CATHETERS HC SUPPLY CATHETER DIAGNOSTIC 156060 1   CATH INFINITI 5 FR JL3.5 99371 No CATHETERS HC SUPPLY CATHETER DIAGNOSTIC 156060 0   WIRE SAFE-T 1.5MM-J .035X260CM 62694 No WIRE   1   GLIDESHEATH SLEND A-KIT 42F 22G 62943 No SHEATH HC SUPPLY SHEATH INTRODUCER 156076 1    Implants    Name ID Temporary Type Supply   No information to display    Hemo Data       Most Recent Value   AO Systolic Pressure  696 mmHg   AO Diastolic Pressure  63 mmHg   AO Mean  88 mmHg   LV Systolic Pressure  789 mmHg   LV Diastolic Pressure  15 mmHg   LV EDP  28 mmHg   Arterial Occlusion Pressure Extended Systolic Pressure  381 mmHg   Arterial Occlusion Pressure Extended Diastolic Pressure  61 mmHg   Arterial Occlusion Pressure Extended Mean Pressure  87 mmHg   Left Ventricular Apex Extended Systolic Pressure  017 mmHg    Left Ventricular Apex Extended Diastolic Pressure  15 mmHg   Left Ventricular Apex Extended EDP Pressure  29 mmHg    Order-Level Documents - 09/30/14:      Scan on 10/01/2014 12:30 PM by Provider Default, MDScan on 10/01/2014 12:30 PM by Provider Default, MD    Encounter-Level Documents - 09/30/14:      Scan on 10/02/2014 10:28 AM by Provider Default, MDScan on 10/02/2014 10:28 AM by Provider Default, MD     Scan on 10/03/2014 9:44 AM by Provider Default, MDScan on 10/03/2014 9:44 AM by Provider Default, MD     Electronic signature on 09/30/2014 3:21 PM     Electronic signature on 09/30/2014 3:21 PM    Signed    Electronically signed by Belva Crome, MD on 10/02/14 at 1433 EDT    Cardiac catheterization (Order [id number])  Cardiac Cath  Order: [id number]   Released By: Sunday Shams, RN  Authorizing: Belva Crome, MD   Date: 10/01/2014  Department: [facility] 3E CHF       Order Information     Order Date/Time Release Date/Time Start Date/Time End Date/Time    10/01/14 12:01 PM 10/01/14 12:01 PM 10/01/14 12:02 PM 10/01/14 12:02 PM      Order Details     Frequency Duration Priority Order Class    Once 1 occurrence Routine Hospital Performed      Acc#    [id number]      Order History  Inpatient    Date/Time Action Taken User Additional Information    10/01/14 1201 Release Sunday Shams, RN [phone]    10/02/14 1304 Result Ferne Reus, Rad Tech In process  10/02/14 [place], MD Final      Order Requisition     Cardiac catheterization (Order [phone] [phone]) on 10/01/14     Collection Information     Resulting Agency: [facility]     Cardiac catheterization (Order #[id number]) on 10/01/14    PHYSICAL EXAM patient is oriented to person time and place. Affect is normal. Lungs are clear. Respiratory effort is unlabored. Cardiac exam reveals S1 and S2.  The abdomen is soft. There is no peripheral edema.   TELEMETRY: I have reviewed telemetry today Oct 03, 2014. There is normal sinus rhythm with PVCs.   ASSESSMENT AND PLAN:     Unstable angina   Hx of CABG 2001, abnormal but low risk Myoview April 2015    Catheterization report is included in this note. Medical therapy is recommended    ICM-EF 35% by echo [facility] April 2016     Consideration can be given 2 more events treatment of his CHF.    Chronic renal impairment, stage 3 (moderate)     Renal function is stable today.    Chronic systolic congestive heart failure     His volume status appears to be stable today. I've talked with him about continuing to weigh himself daily. We will arrange for early post hospital follow-up to assess his volume status again and to make decisions about further approaches to his cardiomyopathy    PVD - h/o AAA stent graft, popliteal aneurysm repair      He seems to be stable after this recent procedure.  The plan will be to send him home today with careful early post hospital follow-up.   Dola Argyle 10/03/2014 10:57 AM

## 2014-10-05 ENCOUNTER — Encounter (HOSPITAL_COMMUNITY): Payer: Self-pay | Admitting: Interventional Cardiology

## 2014-10-05 DIAGNOSIS — T82330A Leakage of aortic (bifurcation) graft (replacement), initial encounter: Secondary | ICD-10-CM | POA: Diagnosis not present

## 2014-10-05 DIAGNOSIS — I716 Thoracoabdominal aortic aneurysm, without rupture: Secondary | ICD-10-CM | POA: Diagnosis not present

## 2014-10-05 MED FILL — Lidocaine HCl Local Preservative Free (PF) Inj 1%: INTRAMUSCULAR | Qty: 30 | Status: AC

## 2014-10-05 MED FILL — Heparin Sodium (Porcine) 2 Unit/ML in Sodium Chloride 0.9%: INTRAMUSCULAR | Qty: 1000 | Status: AC

## 2014-10-13 ENCOUNTER — Ambulatory Visit (INDEPENDENT_AMBULATORY_CARE_PROVIDER_SITE_OTHER): Payer: Medicare Other | Admitting: Cardiology

## 2014-10-13 ENCOUNTER — Encounter: Payer: Self-pay | Admitting: Cardiology

## 2014-10-13 VITALS — BP 136/64 | HR 58 | Ht 73.0 in | Wt 230.0 lb

## 2014-10-13 DIAGNOSIS — I25709 Atherosclerosis of coronary artery bypass graft(s), unspecified, with unspecified angina pectoris: Secondary | ICD-10-CM | POA: Diagnosis not present

## 2014-10-13 DIAGNOSIS — I255 Ischemic cardiomyopathy: Secondary | ICD-10-CM

## 2014-10-13 MED ORDER — SIMVASTATIN 20 MG PO TABS: 20.0000 mg | ORAL_TABLET | Freq: Every day | ORAL | Status: DC

## 2014-10-13 MED ORDER — AMLODIPINE BESYLATE 2.5 MG PO TABS: 2.5000 mg | ORAL_TABLET | Freq: Every day | ORAL | Status: DC

## 2014-10-13 NOTE — Progress Notes (Signed)
Cardiology Office Note  Date: 10/13/2014   ID: [name], DOB Jul 01, 1945, MRN [id number]  PCP: Rosita Fire, MD  Primary Cardiologist: Rozann Lesches, MD   Chief Complaint  Patient presents with  . Hospitalization Follow-up    History of Present Illness: [name] is a medically complex 69 y.o. male seen in early May for follow-up of a hospital stay at [facility], detailed in the prior note. At that time he was reporting weakness, shortness of breath, and angina symptoms. He had been evaluated by the [facility] cardiology service following repair of a type I aortic graft endoleak. He had had documentation of PVCs, questionable VT perioperatively, and LVEF down to 35% representing a decrease from prior assessment. We discussed referral for follow-up outpatient ischemic testing. He unfortunately manifested progressive symptoms, and ultimately required hospitalization at [facility]. He underwent cardiac catheterization with Dr. Irish Lack on May 13. Study results are noted below showing severe graft disease, essentially only patent LIMA to LAD at this point, and medical therapy was recommended given high risk for surgical revascularization. LVEF was described in the range of 20-25% at that time.  He comes in today for a follow-up visit. Reports fatigue and recurring angina symptoms as well as palpitations. He has had no sudden dizziness or syncope however. We discussed the results of his recent cardiac catheterization including implications related to symptom management in the face of incomplete revascularization, also increased risk for ventricular arrhythmias and sudden cardiac death.  We reviewed his medications and discussed adjustments. We also discussed a consultation with EP.   Past Medical History  Diagnosis Date  . Ischemic cardiomyopathy     LVEF 45-50% 11/2011  . Coronary atherosclerosis of native coronary artery     Multivessel status post CABG  . Angioedema    Secondary to ACE inhibitor  . Bell palsy   . Type 2 diabetes mellitus   . AAA (abdominal aortic aneurysm)     Followed by Dr. Donnetta Hutching  . Essential hypertension, benign   . PAD (peripheral artery disease)     Followed by Dr. Donnetta Hutching  . Sleep apnea   . Arthritis   . Carotid artery disease   . Atrial fibrillation   . Myocardial infarction 2001  . Nephrolithiasis   . Hernia of abdominal wall     Past Surgical History  Procedure Laterality Date  . Hand surgery  2009  . Cataract extraction w/phaco  03/23/2011    Procedure: CATARACT EXTRACTION PHACO AND INTRAOCULAR LENS PLACEMENT (IOC);  Surgeon: Tonny Branch;  Location: [facility];  Service: Ophthalmology;  Laterality: Left;  CDE: 15.99  . Cataract extraction w/phaco  04/17/2011    Procedure: CATARACT EXTRACTION PHACO AND INTRAOCULAR LENS PLACEMENT (IOC);  Surgeon: Tonny Branch;  Location: [facility];  Service: Ophthalmology;  Laterality: Right;  CDE: 23.45  . Foot surgery Left 1963  . Eye surgery    . Coronary artery bypass graft  2001    LIMA to LAD, SVG to diagonal and ramus, SVG to OM, SVG to AM  . Bypass graft popliteal to popliteal Left 06/08/2014    Procedure: BYPASS GRAFT FEMORAL ARTERY TO ABOVE KNEE POPLITEAL;  Surgeon: Rosetta Posner, MD;  Location: [place];  Service: Vascular;  Laterality: Left;  . Abdominal aortic aneurysm repair  November 19, 2013    [facility] :  Dr. Sammuel Hines  . Abdominal aortic aneurysm repair  09-07-2014    [facility]  . Cardiac catheterization N/A 10/02/2014  Procedure: Left Heart Cath and Cors/Grafts Angiography;  Surgeon: Belva Crome, MD;  Location: [place] CV LAB;  Service: Cardiovascular;  Laterality: N/A;    Current Outpatient Prescriptions  Medication Sig Dispense Refill  . acetaminophen (TYLENOL) 500 MG tablet Take 1,000 mg by mouth 2 (two) times daily as needed for headache (pain).    Marland Kitchen allopurinol (ZYLOPRIM) 100 MG tablet Take 100 mg by mouth daily with supper.     Marland Kitchen aspirin EC 81 MG tablet Take 81 mg  by mouth daily.    . carvedilol (COREG) 25 MG tablet Take 0.5 tablets (12.5 mg total) by mouth 2 (two) times daily with a meal. 30 tablet 6  . furosemide (LASIX) 40 MG tablet Take 40 mg by mouth 2 (two) times daily.     Marland Kitchen gabapentin (NEURONTIN) 300 MG capsule Take 300 mg by mouth 2 (two) times daily.    Marland Kitchen glipiZIDE (GLUCOTROL XL) 10 MG 24 hr tablet Take 5 mg by mouth 2 (two) times daily.     . isosorbide mononitrate (IMDUR) 60 MG 24 hr tablet Take 2 tablets (120 mg total) by mouth every morning. & 30 mg in the evening (Patient taking differently: Take 30-120 mg by mouth 2 (two) times daily. Take 2 tablets (120 mg) every morning and 1/2 tablet (30 mg) with supper) 90 tablet 3  . linagliptin (TRADJENTA) 5 MG TABS tablet Take 5 mg by mouth daily.      . nitroGLYCERIN (NITROSTAT) 0.4 MG SL tablet Place 0.4 mg under the tongue every 5 (five) minutes as needed for chest pain.    . simvastatin (ZOCOR) 20 MG tablet Take 1 tablet (20 mg total) by mouth at bedtime. 90 tablet 3  . amLODipine (NORVASC) 2.5 MG tablet Take 1 tablet (2.5 mg total) by mouth daily. 90 tablet 3   No current facility-administered medications for this visit.    Allergies:  Ace inhibitors; Codeine; Lisinopril; and Hydromorphone   Social History: The patient  reports that he quit smoking about 7 years ago. His smoking use included Cigarettes. He started smoking about 35 years ago. He has a 25 pack-year smoking history. He has never used smokeless tobacco. He reports that he does not drink alcohol or use illicit drugs.    ROS:  Please see the history of present illness. Otherwise, complete review of systems is positive for anxiety about above symptoms.  All other systems are reviewed and negative.   Physical Exam: VS:  BP 136/64 mmHg  Pulse 58  Ht 6\' 1"  (1.854 m)  Wt 230 lb (104.327 kg)  BMI 30.35 kg/m2, BMI Body mass index is 30.35 kg/(m^2).  Wt Readings from Last 3 Encounters:  10/13/14 230 lb (104.327 kg)  10/03/14 226 lb  3.2 oz (102.604 kg)  09/29/14 229 lb 8 oz (104.101 kg)     Obese male, appears comfortable at rest.  HEENT: Conjunctiva and lids normal, oropharynx clear.  Neck: Supple, no elevated JVP or carotid bruits, no thyromegaly.  Lungs: Clear to auscultation, nonlabored breathing at rest.  Cardiac: Regular rate and rhythm with ectopy, S4, no significant systolic murmur, no pericardial rub.  Abdomen: Soft, nontender, protuberant, bowel sounds present.  Extremities: Diminished DPs bilaterally.. Skin: Warm and dry.  Musculoskeletal: No kyphosis.  Neuropsychiatric: Alert and oriented x3, affect grossly appropriate.   ECG: Tracing from 09/30/2014 showed sinus rhythm with left bundle branch block and PVCs.  Recent Labwork: 09/30/2014: ALT 14*; AST 18 10/02/2014: BUN 17; Creatinine 1.55*; Hemoglobin 9.9*; Platelets  401*; Potassium 4.2; Sodium 141   Other Studies Reviewed Today:  Cardiac catheterization 10/02/2014:  Bypass graft failure with total occlusion of the saphenous vein graft to the acute marginal of the right coronary, total occlusion of the sequential saphenous vein graft to the diagonal and ramus intermedius, and total occlusion of the saphenous graft to the distal circumflex/obtuse marginal.  Patent LIMA to the LAD.  Total occlusion of the native LAD, the mid right coronary, second obtuse marginal, the first diagonal, and the ramus intermedius.  The distal right coronary, the obtuse marginal, ramus intermedius are collateralized from the native circulation via the LIMA to LAD.  Severe left ventricular systolic dysfunction with an inferobasal aneurysm, and an ejection fraction of 20-25%.   ASSESSMENT AND PLAN:  1. Symptomatic ischemic heart disease, multivessel with severe graft disease as outlined, essentially only patent LIMA to LAD at this point and other collaterals providing some additional native coronary flow. This is associated with a reduction in LVEF to approximately  25% at angiography, also frequent PVCs and potentially a recent episode of VT during hospital stay at [facility], although not completely well documented. Patient was deemed high risk to consider repeat surgical revascularization. Overall he has a fairly poor prognosis, although we will attempt medication adjustments aimed at symptom control, and also make a referral to EP for discussion of possible ICD. He will however likely have an increased risk for recurring device shocks without other good options for reducing his ventricular arrhythmia burden, particularly in the face of incompletely revascularized CAD, and probable significant ischemic burden. We will continue Coreg and Imdur, initiate Norvasc for additional antianginal benefit. He has a history of anaphylactic reaction to ACE inhibitor, therefore these as well as ARB's will be avoided. Office follow-up arranged.  2. Peripheral vascular disease as outlined above, status post recent repair at [facility] of a type I endoleak following FEVAR. He is followed by Dr. Donnetta Hutching.  Current medicines were reviewed at length with the patient today.   Orders Placed This Encounter  Procedures  . Lipid Profile  . Ambulatory referral to Cardiology    Disposition: FU with me in 1 month.   Signed, Satira Sark, MD, [facility] 10/13/2014 4:04 PM     [facility] [address], [place], [place] [address] Phone: [phone]; Fax: [phone]

## 2014-10-13 NOTE — Patient Instructions (Addendum)
Your physician recommends that you schedule a follow-up appointment in: 1 month with Dr Domenic Polite  START Amlodipine 2.5 mg daily  DECREASE Zocor to 20 mg daily  Please get FASTING lipids   Please schedule apt with either Dr Rayann Heman or Lovena Le in [place] for the first visit

## 2014-10-14 ENCOUNTER — Other Ambulatory Visit: Payer: Self-pay | Admitting: Cardiology

## 2014-10-15 ENCOUNTER — Other Ambulatory Visit: Payer: Self-pay

## 2014-10-16 DIAGNOSIS — E785 Hyperlipidemia, unspecified: Secondary | ICD-10-CM | POA: Diagnosis not present

## 2014-10-17 LAB — LIPID PANEL
Cholesterol: 118 mg/dL (ref 0–200)
HDL: 33 mg/dL — AB (ref >=40)
LDL Cholesterol: 65 mg/dL (ref 0–99)
TRIGLYCERIDES: 100 mg/dL (ref <150)
Total CHOL/HDL Ratio: 3.6 Ratio
VLDL: 20 mg/dL (ref 0–40)

## 2014-10-20 ENCOUNTER — Ambulatory Visit (INDEPENDENT_AMBULATORY_CARE_PROVIDER_SITE_OTHER): Payer: Medicare Other | Admitting: Internal Medicine

## 2014-10-20 ENCOUNTER — Encounter: Payer: Self-pay | Admitting: *Deleted

## 2014-10-20 ENCOUNTER — Encounter: Payer: Self-pay | Admitting: Internal Medicine

## 2014-10-20 VITALS — BP 132/62 | HR 39 | Ht 73.0 in | Wt 232.4 lb

## 2014-10-20 DIAGNOSIS — I493 Ventricular premature depolarization: Secondary | ICD-10-CM

## 2014-10-20 DIAGNOSIS — I5022 Chronic systolic (congestive) heart failure: Secondary | ICD-10-CM

## 2014-10-20 DIAGNOSIS — I255 Ischemic cardiomyopathy: Secondary | ICD-10-CM | POA: Diagnosis not present

## 2014-10-20 LAB — CBC WITH DIFFERENTIAL/PLATELET
Basophils Absolute: 0.1 10*3/uL (ref 0.0–0.1)
Basophils Relative: 0.7 % (ref 0.0–3.0)
Eosinophils Absolute: 0.8 10*3/uL — ABNORMAL HIGH (ref 0.0–0.7)
Eosinophils Relative: 8 % — ABNORMAL HIGH (ref 0.0–5.0)
HEMATOCRIT: 34.1 % — AB (ref 39.0–52.0)
HEMOGLOBIN: 11 g/dL — AB (ref 13.0–17.0)
LYMPHS PCT: 26.6 % (ref 12.0–46.0)
Lymphs Abs: 2.7 10*3/uL (ref 0.7–4.0)
MCHC: 32.3 g/dL (ref 30.0–36.0)
MCV: 88.2 fl (ref 78.0–100.0)
MONOS PCT: 13.3 % — AB (ref 3.0–12.0)
Monocytes Absolute: 1.3 10*3/uL — ABNORMAL HIGH (ref 0.1–1.0)
NEUTROS ABS: 5.1 10*3/uL (ref 1.4–7.7)
Neutrophils Relative %: 51.4 % (ref 43.0–77.0)
Platelets: 322 10*3/uL (ref 150.0–400.0)
RBC: 3.87 Mil/uL — ABNORMAL LOW (ref 4.22–5.81)
RDW: 17.6 % — ABNORMAL HIGH (ref 11.5–15.5)
WBC: 10 10*3/uL (ref 4.0–10.5)

## 2014-10-20 LAB — BASIC METABOLIC PANEL
BUN: 21 mg/dL (ref 6–23)
CALCIUM: 9.2 mg/dL (ref 8.4–10.5)
CO2: 28 meq/L (ref 19–32)
CREATININE: 1.66 mg/dL — AB (ref 0.40–1.50)
Chloride: 103 mEq/L (ref 96–112)
GFR: 44.19 mL/min — ABNORMAL LOW (ref >60.00)
Glucose, Bld: 60 mg/dL — ABNORMAL LOW (ref 70–99)
Potassium: 3.9 mEq/L (ref 3.5–5.1)
SODIUM: 139 meq/L (ref 135–145)

## 2014-10-20 NOTE — Assessment & Plan Note (Signed)
He has had longstanding LV dysfunction. He has no angina. There is no option for revascularization. He will continue his current medical therapy.

## 2014-10-20 NOTE — Patient Instructions (Signed)
Medication Instructions:  Your physician recommends that you continue on your current medications as directed. Please refer to the Current Medication list given to you today.   Labwork: Your physician recommends that you return for lab work today BMP/CBC   Testing/Procedures: Your physician has recommended that you have a defibrillator inserted. An implantable cardioverter defibrillator (ICD) is a small device that is placed in your chest or, in rare cases, your abdomen. This device uses electrical pulses or shocks to help control life-threatening, irregular heartbeats that could lead the heart to suddenly stop beating (sudden cardiac arrest). Leads are attached to the ICD that goes into your heart. This is done in the hospital and usually requires an overnight stay. Please see the instruction sheet given to you today for more information.    Follow-Up: Your physician recommends that you schedule a follow-up appointment in: 10-14 days from 11/05/14 in device clinic for wound check   Any Other Special Instructions Will Be Listed Below (If Applicable).

## 2014-10-20 NOTE — Assessment & Plan Note (Signed)
He has LBBB and chronic systolic heart failure (class 3) and maximal medical therapy. He is not on an ARB/ACE due to anaphylaxis. I discussed the risks/benefits/goals/expectations of BiV ICD implant with the patient and he wishes to proceed.

## 2014-10-20 NOTE — Progress Notes (Signed)
HPI [name] is referred today by Dr. Domenic Polite for evaluation of and consideration for ICD insertion. He is a pleasant 69 yo man with chronic systolic heart failure, CAD, s/p CABG, LBBB, who underwent catheterization several weeks ago. He was found to have an EF 25%. He has class 3 heart failure and a QRS duration of 165 ms with LBBB. He has not had syncope. He notes that he has felt poorly since undergoing AAA revision for stent-graft leak.  Allergies  Allergen Reactions  . Ace Inhibitors Anaphylaxis  . Codeine Other (See Comments)     delirium  . Lisinopril Anaphylaxis and Swelling  . Hydromorphone Nausea And Vomiting     Current Outpatient Prescriptions  Medication Sig Dispense Refill  . acetaminophen (TYLENOL) 500 MG tablet Take 1,000 mg by mouth 2 (two) times daily as needed for headache (pain).    Marland Kitchen allopurinol (ZYLOPRIM) 100 MG tablet Take 100 mg by mouth daily with supper.     Marland Kitchen amLODipine (NORVASC) 2.5 MG tablet Take 1 tablet (2.5 mg total) by mouth daily. 90 tablet 3  . aspirin EC 81 MG tablet Take 81 mg by mouth daily.    . carvedilol (COREG) 25 MG tablet Take 0.5 tablets (12.5 mg total) by mouth 2 (two) times daily with a meal. 30 tablet 6  . furosemide (LASIX) 40 MG tablet Take 40 mg by mouth 2 (two) times daily.     Marland Kitchen gabapentin (NEURONTIN) 300 MG capsule Take 300 mg by mouth 2 (two) times daily.    Marland Kitchen glipiZIDE (GLUCOTROL XL) 10 MG 24 hr tablet Take 5 mg by mouth 2 (two) times daily.     . isosorbide mononitrate (IMDUR) 60 MG 24 hr tablet Take 2 tablets (120 mg total) by mouth every morning. & 30 mg in the evening (Patient taking differently: Take 30-120 mg by mouth 2 (two) times daily. Take 2 tablets (120 mg) every morning and 1/2 tablet (30 mg) with supper) 90 tablet 3  . linagliptin (TRADJENTA) 5 MG TABS tablet Take 5 mg by mouth daily.      Marland Kitchen NITROSTAT 0.4 MG SL tablet PLACE 1 TABLET UNDER THE TONGUE EVERY 5 MINUTES UP TO 3 DOSES AS NEEDED FOR CHEST PAIN. 25  tablet 3  . simvastatin (ZOCOR) 20 MG tablet Take 1 tablet (20 mg total) by mouth at bedtime. 90 tablet 3   No current facility-administered medications for this visit.     Past Medical History  Diagnosis Date  . Ischemic cardiomyopathy     LVEF 45-50% 11/2011  . Coronary atherosclerosis of native coronary artery     Multivessel status post CABG  . Angioedema     Secondary to ACE inhibitor  . Bell palsy   . Type 2 diabetes mellitus   . AAA (abdominal aortic aneurysm)     Followed by Dr. Donnetta Hutching  . Essential hypertension, benign   . PAD (peripheral artery disease)     Followed by Dr. Donnetta Hutching  . Sleep apnea   . Arthritis   . Carotid artery disease   . Atrial fibrillation   . Myocardial infarction 2001  . Nephrolithiasis   . Hernia of abdominal wall     ROS:   All systems reviewed and negative except as noted in the HPI.   Past Surgical History  Procedure Laterality Date  . Hand surgery  2009  . Cataract extraction w/phaco  03/23/2011    Procedure: CATARACT EXTRACTION PHACO AND INTRAOCULAR LENS PLACEMENT (  [place]);  Surgeon: Tonny Branch;  Location: [facility];  Service: Ophthalmology;  Laterality: Left;  CDE: 15.99  . Cataract extraction w/phaco  04/17/2011    Procedure: CATARACT EXTRACTION PHACO AND INTRAOCULAR LENS PLACEMENT (IOC);  Surgeon: Tonny Branch;  Location: [facility];  Service: Ophthalmology;  Laterality: Right;  CDE: 23.45  . Foot surgery Left 1963  . Eye surgery    . Coronary artery bypass graft  2001    LIMA to LAD, SVG to diagonal and ramus, SVG to OM, SVG to AM  . Bypass graft popliteal to popliteal Left 06/08/2014    Procedure: BYPASS GRAFT FEMORAL ARTERY TO ABOVE KNEE POPLITEAL;  Surgeon: Rosetta Posner, MD;  Location: [place];  Service: Vascular;  Laterality: Left;  . Abdominal aortic aneurysm repair  November 19, 2013    [facility] :  Dr. Sammuel Hines  . Abdominal aortic aneurysm repair  09-07-2014    [facility]  . Cardiac catheterization N/A 10/02/2014    Procedure: Left  Heart Cath and Cors/Grafts Angiography;  Surgeon: Belva Crome, MD;  Location: [place] CV LAB;  Service: Cardiovascular;  Laterality: N/A;     Family History  Problem Relation Age of Onset  . Diabetes Mother     Type  I   . Varicose Veins Mother   . Heart disease Father 9    AAA  . AAA (abdominal aortic aneurysm) Father   . Hyperlipidemia Father   . Hypertension Father      History   Social History  . Marital Status: Divorced    Spouse Name: N/A  . Number of Children: N/A  . Years of Education: N/A   Occupational History  . Not on file.   Social History Main Topics  . Smoking status: Former Smoker -- 1.00 packs/day for 25 years    Types: Cigarettes    Start date: 02/28/1979    Quit date: 05/23/2007  . Smokeless tobacco: Never Used  . Alcohol Use: No  . Drug Use: No  . Sexual Activity: Not on file   Other Topics Concern  . Not on file   Social History Narrative     BP 132/62 mmHg  Pulse 39  Ht 6\' 1"  (1.854 m)  Wt 232 lb 6.4 oz (105.416 kg)  BMI 30.67 kg/m2  Physical Exam:  Well appearing 69 yo man, NAD HEENT: Unremarkable Neck:  No JVD, no thyromegally Back:  No CVA tenderness Lungs:  Clear with no wheezes HEART:  Regular rate rhythm, no murmurs, no rubs, no clicks Abd:  soft, positive bowel sounds, no organomegally, no rebound, no guarding Ext:  2 plus pulses, no edema, no cyanosis, no clubbing Skin:  No rashes no nodules Neuro:  CN II through XII intact, motor grossly intact  EKG - nsr with LBBB (reviewed)   Assess/Plan:

## 2014-10-20 NOTE — Assessment & Plan Note (Signed)
It is unclear as to what the input of his PVC's is having on his heart failure. Once his device is in place and he has backup pacing, will plan to attempt to suppress his PVC's.

## 2014-10-29 ENCOUNTER — Encounter: Payer: Self-pay | Admitting: Cardiology

## 2014-10-29 ENCOUNTER — Ambulatory Visit (INDEPENDENT_AMBULATORY_CARE_PROVIDER_SITE_OTHER): Payer: Medicare Other | Admitting: Cardiology

## 2014-10-29 VITALS — BP 130/70 | HR 58 | Ht 73.0 in | Wt 231.0 lb

## 2014-10-29 DIAGNOSIS — I25709 Atherosclerosis of coronary artery bypass graft(s), unspecified, with unspecified angina pectoris: Secondary | ICD-10-CM

## 2014-10-29 DIAGNOSIS — I493 Ventricular premature depolarization: Secondary | ICD-10-CM | POA: Diagnosis not present

## 2014-10-29 DIAGNOSIS — I255 Ischemic cardiomyopathy: Secondary | ICD-10-CM

## 2014-10-29 NOTE — Patient Instructions (Signed)
Your physician recommends that you continue on your current medications as directed. Please refer to the Current Medication list given to you today. Your physician recommends that you schedule a follow-up appointment in: keep already scheduled appointment on 11/26/14 @9 :00 am with Dr. Domenic Polite at the [place] office.

## 2014-10-29 NOTE — Progress Notes (Signed)
Cardiology Office Note  Date: 10/29/2014   ID: [name], DOB January 29, 1946, MRN [id number]  PCP: Rosita Fire, MD  Primary Cardiologist: Rozann Lesches, MD   Chief Complaint  Patient presents with  . Coronary Artery Disease  . Cardiomyopathy    History of Present Illness: [name] is a medically complex 69 y.o. male last seen in May. Medical therapy was adjusted at the last visit, and he was referred for EP consultation to discuss ICD placement. He saw Dr. Lovena Le, and is now scheduled undergo biventricular ICD next week.  He describes less angina symptoms since we added Norvasc. Still fatigued. Intermittently has increased PVC burden reflected as relative bradycardia. We are somewhat limited in titrating his medications until after he has a device in place.  He denies any sudden dizziness or syncope.   Past Medical History  Diagnosis Date  . Ischemic cardiomyopathy     LVEF 45-50% 11/2011  . Coronary atherosclerosis of native coronary artery     Multivessel status post CABG  . Angioedema     Secondary to ACE inhibitor  . Bell palsy   . Type 2 diabetes mellitus   . AAA (abdominal aortic aneurysm)     Followed by Dr. Donnetta Hutching  . Essential hypertension, benign   . PAD (peripheral artery disease)     Followed by Dr. Donnetta Hutching  . Sleep apnea   . Arthritis   . Carotid artery disease   . Atrial fibrillation   . Myocardial infarction 2001  . Nephrolithiasis   . Hernia of abdominal wall     Past Surgical History  Procedure Laterality Date  . Hand surgery  2009  . Cataract extraction w/phaco  03/23/2011    Procedure: CATARACT EXTRACTION PHACO AND INTRAOCULAR LENS PLACEMENT (IOC);  Surgeon: Tonny Branch;  Location: [facility];  Service: Ophthalmology;  Laterality: Left;  CDE: 15.99  . Cataract extraction w/phaco  04/17/2011    Procedure: CATARACT EXTRACTION PHACO AND INTRAOCULAR LENS PLACEMENT (IOC);  Surgeon: Tonny Branch;  Location: [facility];  Service: Ophthalmology;   Laterality: Right;  CDE: 23.45  . Foot surgery Left 1963  . Eye surgery    . Coronary artery bypass graft  2001    LIMA to LAD, SVG to diagonal and ramus, SVG to OM, SVG to AM  . Bypass graft popliteal to popliteal Left 06/08/2014    Procedure: BYPASS GRAFT FEMORAL ARTERY TO ABOVE KNEE POPLITEAL;  Surgeon: Rosetta Posner, MD;  Location: [facility];  Service: Vascular;  Laterality: Left;  . Abdominal aortic aneurysm repair  November 19, 2013    [facility] :  Dr. Sammuel Hines  . Abdominal aortic aneurysm repair  09-07-2014    [facility]  . Cardiac catheterization N/A 10/02/2014    Procedure: Left Heart Cath and Cors/Grafts Angiography;  Surgeon: Belva Crome, MD;  Location: [facility];  Service: Cardiovascular;  Laterality: N/A;    Current Outpatient Prescriptions  Medication Sig Dispense Refill  . acetaminophen (TYLENOL) 500 MG tablet Take 1,000 mg by mouth 2 (two) times daily as needed for headache (pain).    Marland Kitchen allopurinol (ZYLOPRIM) 100 MG tablet Take 100 mg by mouth daily with supper.     Marland Kitchen amLODipine (NORVASC) 2.5 MG tablet Take 1 tablet (2.5 mg total) by mouth daily. 90 tablet 3  . aspirin EC 81 MG tablet Take 81 mg by mouth daily.    . carvedilol (COREG) 25 MG tablet Take 0.5 tablets (12.5 mg total)  by mouth 2 (two) times daily with a meal. 30 tablet 6  . furosemide (LASIX) 40 MG tablet Take 40 mg by mouth 2 (two) times daily.     Marland Kitchen gabapentin (NEURONTIN) 300 MG capsule Take 300 mg by mouth 2 (two) times daily.    Marland Kitchen glipiZIDE (GLUCOTROL XL) 10 MG 24 hr tablet Take 5 mg by mouth 2 (two) times daily.     . isosorbide mononitrate (IMDUR) 60 MG 24 hr tablet Take 2 tablets (120 mg total) by mouth every morning. & 30 mg in the evening (Patient taking differently: Take 30-120 mg by mouth 2 (two) times daily. Take 2 tablets (120 mg) every morning and 1/2 tablet (30 mg) with supper) 90 tablet 3  . linagliptin (TRADJENTA) 5 MG TABS tablet Take 5 mg by mouth daily.      Marland Kitchen NITROSTAT 0.4 MG SL tablet  PLACE 1 TABLET UNDER THE TONGUE EVERY 5 MINUTES UP TO 3 DOSES AS NEEDED FOR CHEST PAIN. 25 tablet 3  . simvastatin (ZOCOR) 20 MG tablet Take 1 tablet (20 mg total) by mouth at bedtime. 90 tablet 3   No current facility-administered medications for this visit.    Allergies:  Ace inhibitors; Codeine; Lisinopril; and Hydromorphone   Social History: The patient  reports that he quit smoking about 7 years ago. His smoking use included Cigarettes. He started smoking about 35 years ago. He has a 25 pack-year smoking history. He has never used smokeless tobacco. He reports that he does not drink alcohol or use illicit drugs.   ROS:  Please see the history of present illness. Otherwise, complete review of systems is positive for intermittent palpitations and feeling of forceful heartbeat.  All other systems are reviewed and negative.   Physical Exam: VS:  BP 130/70 mmHg  Pulse 58  Ht 6\' 1"  (1.854 m)  Wt 231 lb (104.781 kg)  BMI 30.48 kg/m2  SpO2 98%, BMI Body mass index is 30.48 kg/(m^2).  Wt Readings from Last 3 Encounters:  10/29/14 231 lb (104.781 kg)  10/20/14 232 lb 6.4 oz (105.416 kg)  10/13/14 230 lb (104.327 kg)     General: Patient appears comfortable at rest. HEENT: Conjunctiva and lids normal, oropharynx clear with moist mucosa. Neck: Supple, no elevated JVP or carotid bruits, no thyromegaly. Lungs: Clear to auscultation, nonlabored breathing at rest. Cardiac: Regular rate and rhythm, no S3 or significant systolic murmur, no pericardial rub. Abdomen: Soft, nontender, no hepatomegaly, bowel sounds present, no guarding or rebound. Extremities: No pitting edema, distal pulses 2+. Skin: Warm and dry. Musculoskeletal: No kyphosis. Neuropsychiatric: Alert and oriented x3, affect grossly appropriate.   ECG: ECG is not ordered today.   Recent Labwork: 09/30/2014: ALT 14*; AST 18 10/20/2014: BUN 21; Creatinine, Ser 1.66*; Hemoglobin 11.0*; Platelets 322.0; Potassium 3.9; Sodium 139       Component Value Date/Time   CHOL 118 10/16/2014 0715   TRIG 100 10/16/2014 0715   HDL 33* 10/16/2014 0715   CHOLHDL 3.6 10/16/2014 0715   VLDL 20 10/16/2014 0715   LDLCALC 65 10/16/2014 0715    Other Studies Reviewed Today:  Cardiac catheterization 10/02/2014:  Bypass graft failure with total occlusion of the saphenous vein graft to the acute marginal of the right coronary, total occlusion of the sequential saphenous vein graft to the diagonal and ramus intermedius, and total occlusion of the saphenous graft to the distal circumflex/obtuse marginal.  Patent LIMA to the LAD.  Total occlusion of the native LAD, the mid right  coronary, second obtuse marginal, the first diagonal, and the ramus intermedius.  The distal right coronary, the obtuse marginal, ramus intermedius are collateralized from the native circulation via the LIMA to LAD.  Severe left ventricular systolic dysfunction with an inferobasal aneurysm, and an ejection fraction of 20-25%.  Assessment and Plan:  1. Severe multivessel CAD and graft disease with patent LIMA to the LAD, medical therapy recommended after recent cardiac catheterization as outlined above. He has had better angina control on Norvasc.  2. Ischemic cardiomyopathy with LVEF approximately 20-25% and left bundle branch block. He is scheduled undergo biventricular ICD with Dr. Lovena Le next week.  3. Ischemic myopathy, LDL well controlled on Zocor.  Current medicines were reviewed with the patient today.   Disposition: FU with me in July.   Signed, Satira Sark, MD, [facility] 10/29/2014 1:20 PM    [facility] [place], [place], [place] [address] Phone: [phone]; Fax: [phone]

## 2014-11-04 MED ORDER — SODIUM CHLORIDE 0.9 % IR SOLN
80.0000 mg | Status: AC
  Administered 2014-11-05: 80 mg

## 2014-11-04 MED ORDER — CEFAZOLIN SODIUM-DEXTROSE 2-3 GM-% IV SOLR: 2.0000 g | INTRAVENOUS | Status: DC

## 2014-11-05 ENCOUNTER — Ambulatory Visit (HOSPITAL_COMMUNITY)
Admission: RE | Admit: 2014-11-05 | Discharge: 2014-11-06 | Disposition: A | Discharge status: Home / Self Care | Payer: Medicare Other | Source: Ambulatory Visit | Attending: Internal Medicine | Admitting: Internal Medicine

## 2014-11-05 ENCOUNTER — Encounter (HOSPITAL_COMMUNITY): Payer: Self-pay | Admitting: General Practice

## 2014-11-05 ENCOUNTER — Telehealth: Payer: Self-pay | Admitting: Internal Medicine

## 2014-11-05 ENCOUNTER — Encounter (HOSPITAL_COMMUNITY)
Admission: RE | Disposition: A | Discharge status: Home / Self Care | Payer: Medicare Other | Source: Ambulatory Visit | Attending: Internal Medicine

## 2014-11-05 DIAGNOSIS — Z87891 Personal history of nicotine dependence: Secondary | ICD-10-CM | POA: Diagnosis not present

## 2014-11-05 DIAGNOSIS — I252 Old myocardial infarction: Secondary | ICD-10-CM | POA: Insufficient documentation

## 2014-11-05 DIAGNOSIS — I739 Peripheral vascular disease, unspecified: Secondary | ICD-10-CM | POA: Diagnosis not present

## 2014-11-05 DIAGNOSIS — E119 Type 2 diabetes mellitus without complications: Secondary | ICD-10-CM | POA: Insufficient documentation

## 2014-11-05 DIAGNOSIS — Z7982 Long term (current) use of aspirin: Secondary | ICD-10-CM | POA: Diagnosis not present

## 2014-11-05 DIAGNOSIS — I2510 Atherosclerotic heart disease of native coronary artery without angina pectoris: Secondary | ICD-10-CM | POA: Diagnosis not present

## 2014-11-05 DIAGNOSIS — I447 Left bundle-branch block, unspecified: Secondary | ICD-10-CM | POA: Diagnosis not present

## 2014-11-05 DIAGNOSIS — Z951 Presence of aortocoronary bypass graft: Secondary | ICD-10-CM | POA: Insufficient documentation

## 2014-11-05 DIAGNOSIS — Z95810 Presence of automatic (implantable) cardiac defibrillator: Secondary | ICD-10-CM

## 2014-11-05 DIAGNOSIS — I5022 Chronic systolic (congestive) heart failure: Secondary | ICD-10-CM | POA: Insufficient documentation

## 2014-11-05 DIAGNOSIS — G4730 Sleep apnea, unspecified: Secondary | ICD-10-CM | POA: Insufficient documentation

## 2014-11-05 DIAGNOSIS — I255 Ischemic cardiomyopathy: Secondary | ICD-10-CM | POA: Insufficient documentation

## 2014-11-05 HISTORY — DX: Presence of automatic (implantable) cardiac defibrillator: Z95.810

## 2014-11-05 HISTORY — PX: EP IMPLANTABLE DEVICE: SHX172B

## 2014-11-05 HISTORY — PX: BI-VENTRICULAR IMPLANTABLE CARDIOVERTER DEFIBRILLATOR  (CRT-D): SHX5747

## 2014-11-05 HISTORY — DX: Gout, unspecified: M10.9

## 2014-11-05 HISTORY — DX: Migraine, unspecified, not intractable, without status migrainosus: G43.909

## 2014-11-05 HISTORY — DX: Pure hypercholesterolemia, unspecified: E78.00

## 2014-11-05 LAB — CBC
HEMATOCRIT: 35.1 % — AB (ref 39.0–52.0)
Hemoglobin: 11.5 g/dL — ABNORMAL LOW (ref 13.0–17.0)
MCH: 28.6 pg (ref 26.0–34.0)
MCHC: 32.8 g/dL (ref 30.0–36.0)
MCV: 87.3 fL (ref 78.0–100.0)
Platelets: 351 10*3/uL (ref 150–400)
RBC: 4.02 MIL/uL — AB (ref 4.22–5.81)
RDW: 17.1 % — ABNORMAL HIGH (ref 11.5–15.5)
WBC: 9.1 10*3/uL (ref 4.0–10.5)

## 2014-11-05 LAB — BASIC METABOLIC PANEL
ANION GAP: 9 (ref 5–15)
BUN: 15 mg/dL (ref 6–20)
CHLORIDE: 106 mmol/L (ref 101–111)
CO2: 25 mmol/L (ref 22–32)
Calcium: 9.1 mg/dL (ref 8.9–10.3)
Creatinine, Ser: 1.56 mg/dL — ABNORMAL HIGH (ref 0.61–1.24)
GFR, EST AFRICAN AMERICAN: 52 mL/min — AB (ref >60)
GFR, EST NON AFRICAN AMERICAN: 45 mL/min — AB (ref >60)
Glucose, Bld: 93 mg/dL (ref 65–99)
POTASSIUM: 3.8 mmol/L (ref 3.5–5.1)
SODIUM: 140 mmol/L (ref 135–145)

## 2014-11-05 LAB — SURGICAL PCR SCREEN
MRSA, PCR: NEGATIVE
STAPHYLOCOCCUS AUREUS: NEGATIVE

## 2014-11-05 LAB — GLUCOSE, CAPILLARY
GLUCOSE-CAPILLARY: 86 mg/dL (ref 65–99)
GLUCOSE-CAPILLARY: 91 mg/dL (ref 65–99)
Glucose-Capillary: 87 mg/dL (ref 65–99)

## 2014-11-05 SURGERY — BIV ICD INSERTION CRT-D
Anesthesia: LOCAL

## 2014-11-05 MED ORDER — CARVEDILOL 12.5 MG PO TABS
12.5000 mg | ORAL_TABLET | Freq: Two times a day (BID) | ORAL | Status: DC
  Administered 2014-11-05 – 2014-11-06 (×2): 12.5 mg via ORAL
  Filled 2014-11-05 (×2): qty 1

## 2014-11-05 MED ORDER — ALLOPURINOL 100 MG PO TABS
100.0000 mg | ORAL_TABLET | Freq: Every day | ORAL | Status: DC
  Administered 2014-11-05: 100 mg via ORAL
  Filled 2014-11-05: qty 1

## 2014-11-05 MED ORDER — ISOSORBIDE MONONITRATE ER 60 MG PO TB24
120.0000 mg | ORAL_TABLET | Freq: Every morning | ORAL | Status: DC
  Administered 2014-11-06: 120 mg via ORAL
  Filled 2014-11-05 (×2): qty 2

## 2014-11-05 MED ORDER — SODIUM CHLORIDE 0.9 % IR SOLN
Status: AC
  Filled 2014-11-05: qty 2

## 2014-11-05 MED ORDER — IOHEXOL 350 MG/ML SOLN
INTRAVENOUS | Status: DC | PRN
  Administered 2014-11-05: 15 mL

## 2014-11-05 MED ORDER — MUPIROCIN 2 % EX OINT
1.0000 "application " | TOPICAL_OINTMENT | Freq: Once | CUTANEOUS | Status: AC
  Administered 2014-11-05: 1 via TOPICAL
  Filled 2014-11-05: qty 22

## 2014-11-05 MED ORDER — DEXTROSE 5 % IV SOLN
2.0000 g | INTRAVENOUS | Status: DC | PRN
  Administered 2014-11-05: 2 g via INTRAVENOUS

## 2014-11-05 MED ORDER — LINAGLIPTIN 5 MG PO TABS
5.0000 mg | ORAL_TABLET | Freq: Every day | ORAL | Status: DC
  Administered 2014-11-06: 5 mg via ORAL
  Filled 2014-11-05 (×2): qty 1

## 2014-11-05 MED ORDER — FENTANYL CITRATE (PF) 100 MCG/2ML IJ SOLN
INTRAMUSCULAR | Status: AC
  Filled 2014-11-05: qty 2

## 2014-11-05 MED ORDER — CEFAZOLIN SODIUM-DEXTROSE 2-3 GM-% IV SOLR
2.0000 g | Freq: Four times a day (QID) | INTRAVENOUS | Status: AC
  Administered 2014-11-05 – 2014-11-06 (×3): 2 g via INTRAVENOUS
  Filled 2014-11-05 (×3): qty 50

## 2014-11-05 MED ORDER — AMLODIPINE BESYLATE 2.5 MG PO TABS
2.5000 mg | ORAL_TABLET | Freq: Every day | ORAL | Status: DC
  Administered 2014-11-06: 2.5 mg via ORAL
  Filled 2014-11-05: qty 1

## 2014-11-05 MED ORDER — GABAPENTIN 300 MG PO CAPS
300.0000 mg | ORAL_CAPSULE | Freq: Two times a day (BID) | ORAL | Status: DC
  Administered 2014-11-05 – 2014-11-06 (×2): 300 mg via ORAL
  Filled 2014-11-05 (×2): qty 1

## 2014-11-05 MED ORDER — SODIUM CHLORIDE 0.9 % IV SOLN
INTRAVENOUS | Status: DC
  Administered 2014-11-05: 13:00:00 via INTRAVENOUS

## 2014-11-05 MED ORDER — NITROGLYCERIN 0.4 MG SL SUBL: 0.4000 mg | SUBLINGUAL_TABLET | SUBLINGUAL | Status: DC | PRN

## 2014-11-05 MED ORDER — MIDAZOLAM HCL 5 MG/5ML IJ SOLN
INTRAMUSCULAR | Status: AC
  Filled 2014-11-05: qty 5

## 2014-11-05 MED ORDER — ONDANSETRON HCL 4 MG/2ML IJ SOLN: 4.0000 mg | Freq: Four times a day (QID) | INTRAMUSCULAR | Status: DC | PRN

## 2014-11-05 MED ORDER — FENTANYL CITRATE (PF) 100 MCG/2ML IJ SOLN
INTRAMUSCULAR | Status: DC | PRN
  Administered 2014-11-05 (×2): 12.5 ug via INTRAVENOUS
  Administered 2014-11-05: 25 ug via INTRAVENOUS
  Administered 2014-11-05 (×4): 12.5 ug via INTRAVENOUS

## 2014-11-05 MED ORDER — LIDOCAINE HCL (PF) 1 % IJ SOLN
INTRAMUSCULAR | Status: DC | PRN
  Administered 2014-11-05: 60 mL

## 2014-11-05 MED ORDER — ACETAMINOPHEN 500 MG PO TABS: 1000.0000 mg | ORAL_TABLET | Freq: Two times a day (BID) | ORAL | Status: DC | PRN

## 2014-11-05 MED ORDER — HEPARIN (PORCINE) IN NACL 2-0.9 UNIT/ML-% IJ SOLN
INTRAMUSCULAR | Status: AC
  Filled 2014-11-05: qty 500

## 2014-11-05 MED ORDER — SIMVASTATIN 20 MG PO TABS
20.0000 mg | ORAL_TABLET | Freq: Every day | ORAL | Status: DC
  Administered 2014-11-05: 20 mg via ORAL
  Filled 2014-11-05: qty 1

## 2014-11-05 MED ORDER — HYDROCORTISONE 1 % EX CREA
TOPICAL_CREAM | Freq: Two times a day (BID) | CUTANEOUS | Status: DC
  Administered 2014-11-05 – 2014-11-06 (×2): via TOPICAL
  Filled 2014-11-05: qty 28

## 2014-11-05 MED ORDER — MIDAZOLAM HCL 5 MG/5ML IJ SOLN
INTRAMUSCULAR | Status: DC | PRN
  Administered 2014-11-05 (×6): 1 mg via INTRAVENOUS
  Administered 2014-11-05: 2 mg via INTRAVENOUS
  Administered 2014-11-05: 1 mg via INTRAVENOUS

## 2014-11-05 MED ORDER — ASPIRIN EC 81 MG PO TBEC
81.0000 mg | DELAYED_RELEASE_TABLET | Freq: Every day | ORAL | Status: DC
  Administered 2014-11-05 – 2014-11-06 (×2): 81 mg via ORAL
  Filled 2014-11-05 (×2): qty 1

## 2014-11-05 MED ORDER — DEXTROSE 50 % IV SOLN
INTRAVENOUS | Status: DC | PRN
  Administered 2014-11-05: 1 via INTRAVENOUS

## 2014-11-05 MED ORDER — MUPIROCIN 2 % EX OINT
TOPICAL_OINTMENT | CUTANEOUS | Status: AC
  Filled 2014-11-05: qty 22

## 2014-11-05 MED ORDER — DEXTROSE 50 % IV SOLN
INTRAVENOUS | Status: AC
  Filled 2014-11-05: qty 50

## 2014-11-05 MED ORDER — CEFAZOLIN SODIUM-DEXTROSE 2-3 GM-% IV SOLR
INTRAVENOUS | Status: AC
  Filled 2014-11-05: qty 50

## 2014-11-05 MED ORDER — ACETAMINOPHEN 325 MG PO TABS
325.0000 mg | ORAL_TABLET | ORAL | Status: DC | PRN
  Administered 2014-11-05 – 2014-11-06 (×2): 650 mg via ORAL
  Filled 2014-11-05 (×2): qty 2

## 2014-11-05 MED ORDER — LIDOCAINE HCL (PF) 1 % IJ SOLN
INTRAMUSCULAR | Status: AC
  Filled 2014-11-05: qty 60

## 2014-11-05 MED ORDER — FUROSEMIDE 40 MG PO TABS
40.0000 mg | ORAL_TABLET | Freq: Two times a day (BID) | ORAL | Status: DC
  Administered 2014-11-05 – 2014-11-06 (×2): 40 mg via ORAL
  Filled 2014-11-05 (×2): qty 1

## 2014-11-05 MED ORDER — CHLORHEXIDINE GLUCONATE 4 % EX LIQD
60.0000 mL | Freq: Once | CUTANEOUS | Status: DC
  Filled 2014-11-05: qty 60

## 2014-11-05 MED ORDER — GLIPIZIDE ER 5 MG PO TB24
5.0000 mg | ORAL_TABLET | Freq: Two times a day (BID) | ORAL | Status: DC
  Administered 2014-11-05 – 2014-11-06 (×2): 5 mg via ORAL
  Filled 2014-11-05 (×4): qty 1

## 2014-11-05 SURGICAL SUPPLY — 15 items
ASSURA CRTD CD3369-40C (ICD Generator) ×2 IMPLANT
CABLE SURGICAL S-101-97-12 (CABLE) ×2 IMPLANT
CATH CPS DIRECT 135 DS2C020 (CATHETERS) ×2 IMPLANT
CATH HEX JOSEPH 2-5-2 65CM 6F (CATHETERS) ×2 IMPLANT
DEFIB ASSURA CRT-D (ICD Generator) ×1 IMPLANT
KIT ESSENTIALS PG (KITS) ×2 IMPLANT
LEAD DURATA 7122-65CM (Lead) ×2 IMPLANT
LEAD QUARTET 1458QL-86 (Lead) ×1 IMPLANT
LEAD TENDRIL SDX 1688TC-52CM (Lead) ×2 IMPLANT
PAD DEFIB LIFELINK (PAD) ×2 IMPLANT
QUARTET 1458QL-86 (Lead) ×2 IMPLANT
SHEATH COOK PEEL AWAY SET 7F (SHEATH) ×2 IMPLANT
SHEATH COOK PEEL AWAY SET 9F (SHEATH) ×2 IMPLANT
TRAY PACEMAKER INSERTION (CUSTOM PROCEDURE TRAY) ×2 IMPLANT
WIRE ACUITY WHISPER EDS 4648 (WIRE) ×2 IMPLANT

## 2014-11-05 NOTE — H&P (View-Only) (Signed)
HPI [name] is referred today by Dr. Domenic Polite for evaluation of and consideration for ICD insertion. He is a pleasant 69 yo man with chronic systolic heart failure, CAD, s/p CABG, LBBB, who underwent catheterization several weeks ago. He was found to have an EF 25%. He has class 3 heart failure and a QRS duration of 165 ms with LBBB. He has not had syncope. He notes that he has felt poorly since undergoing AAA revision for stent-graft leak.  Allergies  Allergen Reactions  . Ace Inhibitors Anaphylaxis  . Codeine Other (See Comments)     delirium  . Lisinopril Anaphylaxis and Swelling  . Hydromorphone Nausea And Vomiting     Current Outpatient Prescriptions  Medication Sig Dispense Refill  . acetaminophen (TYLENOL) 500 MG tablet Take 1,000 mg by mouth 2 (two) times daily as needed for headache (pain).    Marland Kitchen allopurinol (ZYLOPRIM) 100 MG tablet Take 100 mg by mouth daily with supper.     Marland Kitchen amLODipine (NORVASC) 2.5 MG tablet Take 1 tablet (2.5 mg total) by mouth daily. 90 tablet 3  . aspirin EC 81 MG tablet Take 81 mg by mouth daily.    . carvedilol (COREG) 25 MG tablet Take 0.5 tablets (12.5 mg total) by mouth 2 (two) times daily with a meal. 30 tablet 6  . furosemide (LASIX) 40 MG tablet Take 40 mg by mouth 2 (two) times daily.     Marland Kitchen gabapentin (NEURONTIN) 300 MG capsule Take 300 mg by mouth 2 (two) times daily.    Marland Kitchen glipiZIDE (GLUCOTROL XL) 10 MG 24 hr tablet Take 5 mg by mouth 2 (two) times daily.     . isosorbide mononitrate (IMDUR) 60 MG 24 hr tablet Take 2 tablets (120 mg total) by mouth every morning. & 30 mg in the evening (Patient taking differently: Take 30-120 mg by mouth 2 (two) times daily. Take 2 tablets (120 mg) every morning and 1/2 tablet (30 mg) with supper) 90 tablet 3  . linagliptin (TRADJENTA) 5 MG TABS tablet Take 5 mg by mouth daily.      Marland Kitchen NITROSTAT 0.4 MG SL tablet PLACE 1 TABLET UNDER THE TONGUE EVERY 5 MINUTES UP TO 3 DOSES AS NEEDED FOR CHEST PAIN. 25  tablet 3  . simvastatin (ZOCOR) 20 MG tablet Take 1 tablet (20 mg total) by mouth at bedtime. 90 tablet 3   No current facility-administered medications for this visit.     Past Medical History  Diagnosis Date  . Ischemic cardiomyopathy     LVEF 45-50% 11/2011  . Coronary atherosclerosis of native coronary artery     Multivessel status post CABG  . Angioedema     Secondary to ACE inhibitor  . Bell palsy   . Type 2 diabetes mellitus   . AAA (abdominal aortic aneurysm)     Followed by Dr. Donnetta Hutching  . Essential hypertension, benign   . PAD (peripheral artery disease)     Followed by Dr. Donnetta Hutching  . Sleep apnea   . Arthritis   . Carotid artery disease   . Atrial fibrillation   . Myocardial infarction 2001  . Nephrolithiasis   . Hernia of abdominal wall     ROS:   All systems reviewed and negative except as noted in the HPI.   Past Surgical History  Procedure Laterality Date  . Hand surgery  2009  . Cataract extraction w/phaco  03/23/2011    Procedure: CATARACT EXTRACTION PHACO AND INTRAOCULAR LENS PLACEMENT (  [place]);  Surgeon: Tonny Branch;  Location: [facility];  Service: Ophthalmology;  Laterality: Left;  CDE: 15.99  . Cataract extraction w/phaco  04/17/2011    Procedure: CATARACT EXTRACTION PHACO AND INTRAOCULAR LENS PLACEMENT (IOC);  Surgeon: Tonny Branch;  Location: [facility];  Service: Ophthalmology;  Laterality: Right;  CDE: 23.45  . Foot surgery Left 1963  . Eye surgery    . Coronary artery bypass graft  2001    LIMA to LAD, SVG to diagonal and ramus, SVG to OM, SVG to AM  . Bypass graft popliteal to popliteal Left 06/08/2014    Procedure: BYPASS GRAFT FEMORAL ARTERY TO ABOVE KNEE POPLITEAL;  Surgeon: Rosetta Posner, MD;  Location: [facility];  Service: Vascular;  Laterality: Left;  . Abdominal aortic aneurysm repair  November 19, 2013    [facility] :  Dr. Sammuel Hines  . Abdominal aortic aneurysm repair  09-07-2014    [facility]  . Cardiac catheterization N/A 10/02/2014    Procedure: Left  Heart Cath and Cors/Grafts Angiography;  Surgeon: Belva Crome, MD;  Location: [facility];  Service: Cardiovascular;  Laterality: N/A;     Family History  Problem Relation Age of Onset  . Diabetes Mother     Type  I   . Varicose Veins Mother   . Heart disease Father 30    AAA  . AAA (abdominal aortic aneurysm) Father   . Hyperlipidemia Father   . Hypertension Father      History   Social History  . Marital Status: Divorced    Spouse Name: N/A  . Number of Children: N/A  . Years of Education: N/A   Occupational History  . Not on file.   Social History Main Topics  . Smoking status: Former Smoker -- 1.00 packs/day for 25 years    Types: Cigarettes    Start date: 02/28/1979    Quit date: 05/23/2007  . Smokeless tobacco: Never Used  . Alcohol Use: No  . Drug Use: No  . Sexual Activity: Not on file   Other Topics Concern  . Not on file   Social History Narrative     BP 132/62 mmHg  Pulse 39  Ht 6\' 1"  (1.854 m)  Wt 232 lb 6.4 oz (105.416 kg)  BMI 30.67 kg/m2  Physical Exam:  Well appearing 69 yo man, NAD HEENT: Unremarkable Neck:  No JVD, no thyromegally Back:  No CVA tenderness Lungs:  Clear with no wheezes HEART:  Regular rate rhythm, no murmurs, no rubs, no clicks Abd:  soft, positive bowel sounds, no organomegally, no rebound, no guarding Ext:  2 plus pulses, no edema, no cyanosis, no clubbing Skin:  No rashes no nodules Neuro:  CN II through XII intact, motor grossly intact  EKG - nsr with LBBB (reviewed)   Assess/Plan:

## 2014-11-05 NOTE — Interval H&P Note (Signed)
History and Physical Interval Note:  11/05/2014 12:56 PM  [place]  has presented today for surgery, with the diagnosis of ischemic cardiomyopathy  The various methods of treatment have been discussed with the patient and family. After consideration of risks, benefits and other options for treatment, the patient has consented to  Procedure(s): BiV ICD Insertion CRT-D (N/A) as a surgical intervention .  The patient's history has been reviewed, patient examined, no change in status, stable for surgery.  I have reviewed the patient's chart and labs.  Questions were answered to the patient's satisfaction.     Cristopher Peru

## 2014-11-05 NOTE — Telephone Encounter (Signed)
NEw Message  Crystal from [facility] requested to speak w/ RN about procedure for the pt for today 6/16. Please call back and discuss.

## 2014-11-05 NOTE — H&P (Signed)
  ICD Criteria  Current LVEF:25% ;Obtained > or = 1 month ago and < or = 3 months ago.  NYHA Functional Classification: Class III  Heart Failure History:  Yes, Duration of heart failure since onset is > 9 months  Non-Ischemic Dilated Cardiomyopathy History:  No.  Atrial Fibrillation/Atrial Flutter:  No.  Ventricular Tachycardia History:  No.  Cardiac Arrest History:  No  History of Syndromes with Risk of Sudden Death:  No.  Previous ICD:  No.  Electrophysiology Study: No.  Prior MI: Yes, Most recent MI timeframe is > 40 days.  PPM: No.  OSA:  No  Patient Life Expectancy of >=1 year: Yes.  Anticoagulation Therapy:  Patient is NOT on anticoagulation therapy.   Beta Blocker Therapy:  Yes.   Ace Inhibitor/ARB Therapy:  No, Reason not on Ace Inhibitor/ARB therapy:  allergic

## 2014-11-05 NOTE — Telephone Encounter (Signed)
Spoke with Crystal and let her know he is to be at the hospital at 12:00noon

## 2014-11-06 ENCOUNTER — Ambulatory Visit (HOSPITAL_COMMUNITY): Payer: Medicare Other

## 2014-11-06 ENCOUNTER — Encounter (HOSPITAL_COMMUNITY): Payer: Self-pay | Admitting: Internal Medicine

## 2014-11-06 DIAGNOSIS — I447 Left bundle-branch block, unspecified: Secondary | ICD-10-CM | POA: Diagnosis not present

## 2014-11-06 DIAGNOSIS — I517 Cardiomegaly: Secondary | ICD-10-CM | POA: Diagnosis not present

## 2014-11-06 DIAGNOSIS — E119 Type 2 diabetes mellitus without complications: Secondary | ICD-10-CM | POA: Diagnosis not present

## 2014-11-06 DIAGNOSIS — I5022 Chronic systolic (congestive) heart failure: Secondary | ICD-10-CM | POA: Diagnosis not present

## 2014-11-06 DIAGNOSIS — I255 Ischemic cardiomyopathy: Secondary | ICD-10-CM | POA: Diagnosis not present

## 2014-11-06 DIAGNOSIS — G4730 Sleep apnea, unspecified: Secondary | ICD-10-CM | POA: Diagnosis not present

## 2014-11-06 DIAGNOSIS — J9811 Atelectasis: Secondary | ICD-10-CM | POA: Diagnosis not present

## 2014-11-06 DIAGNOSIS — I739 Peripheral vascular disease, unspecified: Secondary | ICD-10-CM | POA: Diagnosis not present

## 2014-11-06 MED FILL — Cefazolin Sodium for IV Soln 2 GM and Dextrose 3% (50 ML): INTRAVENOUS | Qty: 50 | Status: AC

## 2014-11-06 MED FILL — Heparin Sodium (Porcine) 2 Unit/ML in Sodium Chloride 0.9%: INTRAMUSCULAR | Qty: 500 | Status: AC

## 2014-11-06 NOTE — Discharge Instructions (Signed)
° ° °  Supplemental Discharge Instructions for  Pacemaker/Defibrillator Patients  Activity No heavy lifting or vigorous activity with your left/right arm for 6 to 8 weeks.  Do not raise your left/right arm above your head for one week.  Gradually raise your affected arm as drawn below.           __      11/10/14                  11/11/14                    11/12/14                   11/13/14  NO DRIVING for  1 week   ; you may begin driving on    [id number] .  WOUND CARE - Keep the wound area clean and dry.  Do not get this area wet for one week. No showers for one week; you may shower on   11/13/14  . - The tape/steri-strips on your wound will fall off; do not pull them off.  No bandage is needed on the site.  DO  NOT apply any creams, oils, or ointments to the wound area. - If you notice any drainage or discharge from the wound, any swelling or bruising at the site, or you develop a fever > 101? F after you are discharged home, call the office at once.  Special Instructions - You are still able to use cellular telephones; use the ear opposite the side where you have your pacemaker/defibrillator.  Avoid carrying your cellular phone near your device. - When traveling through airports, show security personnel your identification card to avoid being screened in the metal detectors.  Ask the security personnel to use the hand wand. - Avoid arc welding equipment, MRI testing (magnetic resonance imaging), TENS units (transcutaneous nerve stimulators).  Call the office for questions about other devices. - Avoid electrical appliances that are in poor condition or are not properly grounded. - Microwave ovens are safe to be near or to operate.  Additional information for defibrillator patients should your device go off: - If your device goes off ONCE and you feel fine afterward, notify the device clinic nurses. - If your device goes off ONCE and you do not feel well afterward, call 911. - If your device  goes off TWICE, call 911. - If your device goes off THREE times in one day, call 911.  DO NOT DRIVE YOURSELF OR A FAMILY MEMBER WITH A DEFIBRILLATOR TO THE HOSPITAL--CALL 911.

## 2014-11-06 NOTE — Discharge Summary (Signed)
ELECTROPHYSIOLOGY PROCEDURE DISCHARGE SUMMARY    Patient ID: [name],  MRN: [id number], DOB/AGE: 1945/12/29 69 y.o.  Admit date: 11/05/2014 Discharge date: 11/06/2014  Primary Care Physician: Rosita Fire, MD Primary Cardiologist: Domenic Polite Electrophysiologist: Lovena Le  Primary Discharge Diagnosis:  Ischemic cardiomyopathy, congestive heart failure, and LBBB status post CRTD implant this admission  Secondary Discharge Diagnosis:  1.  CAD s/p CABG 2.  Diabetes 3.  Angioedema with ACE-I 4.  Sleep apnea 5.  PAD  Allergies  Allergen Reactions  . Ace Inhibitors Anaphylaxis  . Codeine Other (See Comments)     delirium  . Lisinopril Anaphylaxis and Swelling  . Hydromorphone Nausea And Vomiting     Procedures This Admission:  1.  Implantation of a STJ CRTD on 11-06-14 by Dr Lovena Le.  See op note for full details.   DFT's were successful at 66 J.  There were no immediate post procedure complications. 2.  CXR on 11-06-14 demonstrated no pneumothorax status post device implantation.   Brief HPI: [name] is a 69 y.o. male was referred to electrophysiology in the outpatient setting for consideration of ICD implantation.  Past medical history includes ischemic cardiomyopathy, congestive heart failure, and LBBB.  The patient has persistent LV dysfunction despite guideline directed therapy.  Risks, benefits, and alternatives to ICD implantation were reviewed with the patient who wished to proceed.   Hospital Course:  The patient was admitted and underwent implantation of a STJ CRTD with details as outlined above. He was monitored on telemetry overnight which demonstrated sinus rhythm with ventricular pacing.  Left chest was without hematoma or ecchymosis.  The device was interrogated and found to be functioning normally.  CXR was obtained and demonstrated no pneumothorax status post device implantation.  Wound care, arm mobility, and restrictions were reviewed with  the patient.  The patient was examined and considered stable for discharge to home.   The patient's discharge medications include a beta blocker (Coreg). No ACE-I due to prior anyphyalaxis  Physical Exam: Filed Vitals:   11/05/14 1720 11/05/14 1750 11/05/14 2043 11/06/14 0500  BP: 154/82 147/79  141/69  Pulse: 72 71  62  Temp:   97.7 F (36.5 C) 97.8 F (36.6 C)  TempSrc:   Oral Oral  Resp:    14  Height:      Weight:    224 lb 14.3 oz (102.01 kg)  SpO2: 97% 97%  95%    GEN- The patient is well appearing, alert and oriented x 3 today.   HEENT: normocephalic, atraumatic; sclera clear, conjunctiva pink; hearing intact; oropharynx clear; neck supple, no JVP Lymph- no cervical lymphadenopathy Lungs- Clear to ausculation bilaterally, normal work of breathing.  No wheezes, rales, rhonchi Heart- Regular rate and rhythm, no murmurs, rubs or gallops  GI- soft, non-tender, non-distended, bowel sounds present  Extremities- no clubbing, cyanosis, or edema; DP/PT/radial pulses 2+ bilaterally MS- no significant deformity or atrophy Skin- warm and dry, no rash or lesion, left chest without hematoma/ecchymosis Psych- euthymic mood, full affect Neuro- strength and sensation are intact   Labs:   Lab Results  Component Value Date   WBC 9.1 11/05/2014   HGB 11.5* 11/05/2014   HCT 35.1* 11/05/2014   MCV 87.3 11/05/2014   PLT 351 11/05/2014     Recent Labs Lab 11/05/14 1225  NA 140  K 3.8  CL 106  CO2 25  BUN 15  CREATININE 1.56*  CALCIUM 9.1  GLUCOSE 93    Discharge Medications:  Medication List    TAKE these medications        acetaminophen 500 MG tablet  Commonly known as:  TYLENOL  Take 1,000 mg by mouth 2 (two) times daily as needed for headache (pain).     allopurinol 100 MG tablet  Commonly known as:  ZYLOPRIM  Take 100 mg by mouth daily with supper.     amLODipine 2.5 MG tablet  Commonly known as:  NORVASC  Take 1 tablet (2.5 mg total) by mouth daily.      aspirin EC 81 MG tablet  Take 81 mg by mouth daily.     carvedilol 25 MG tablet  Commonly known as:  COREG  Take 0.5 tablets (12.5 mg total) by mouth 2 (two) times daily with a meal.     furosemide 40 MG tablet  Commonly known as:  LASIX  Take 40 mg by mouth 2 (two) times daily.     gabapentin 300 MG capsule  Commonly known as:  NEURONTIN  Take 300 mg by mouth 2 (two) times daily.     glipiZIDE 10 MG 24 hr tablet  Commonly known as:  GLUCOTROL XL  Take 5 mg by mouth 2 (two) times daily.     isosorbide mononitrate 60 MG 24 hr tablet  Commonly known as:  IMDUR  Take 2 tablets (120 mg total) by mouth every morning. & 30 mg in the evening     linagliptin 5 MG Tabs tablet  Commonly known as:  TRADJENTA  Take 5 mg by mouth daily.     NITROSTAT 0.4 MG SL tablet  Generic drug:  nitroGLYCERIN  PLACE 1 TABLET UNDER THE TONGUE EVERY 5 MINUTES UP TO 3 DOSES AS NEEDED FOR CHEST PAIN.     simvastatin 20 MG tablet  Commonly known as:  ZOCOR  Take 1 tablet (20 mg total) by mouth at bedtime.        Disposition:  Discharge Instructions    Diet - low sodium heart healthy    Complete by:  As directed      Increase activity slowly    Complete by:  As directed           Follow-up Information    Follow up with [facility] On 11/19/2014.   Why:  at 10AM for wound check   Contact information:   [place] [phone]       Follow up with Cristopher Peru, MD On 02/04/2015.   Specialty:  Cardiology   Why:  at 10:30AM   Contact information:   [place] [address] [phone]       Duration of Discharge Encounter: Greater than 30 minutes including physician time.  Signed, Chanetta Marshall, NP 11/06/2014 8:59 AM   EP Attending  Patient seen and examined. Agree with above. [place] for discharge.  Mikle Bosworth.D.

## 2014-11-19 ENCOUNTER — Ambulatory Visit (INDEPENDENT_AMBULATORY_CARE_PROVIDER_SITE_OTHER): Payer: Medicare Other | Admitting: *Deleted

## 2014-11-19 ENCOUNTER — Ambulatory Visit (HOSPITAL_COMMUNITY)
Admission: RE | Admit: 2014-11-19 | Discharge: 2014-11-19 | Disposition: A | Discharge status: Home / Self Care | Payer: Medicare Other | Source: Ambulatory Visit | Attending: Internal Medicine | Admitting: Internal Medicine

## 2014-11-19 ENCOUNTER — Encounter: Payer: Self-pay | Admitting: Internal Medicine

## 2014-11-19 DIAGNOSIS — I255 Ischemic cardiomyopathy: Secondary | ICD-10-CM | POA: Diagnosis not present

## 2014-11-19 DIAGNOSIS — I5022 Chronic systolic (congestive) heart failure: Secondary | ICD-10-CM

## 2014-11-19 DIAGNOSIS — I509 Heart failure, unspecified: Secondary | ICD-10-CM | POA: Diagnosis not present

## 2014-11-19 DIAGNOSIS — Z95810 Presence of automatic (implantable) cardiac defibrillator: Secondary | ICD-10-CM

## 2014-11-19 DIAGNOSIS — I517 Cardiomegaly: Secondary | ICD-10-CM | POA: Diagnosis not present

## 2014-11-19 LAB — CUP PACEART INCLINIC DEVICE CHECK
Battery Remaining Longevity: 51.6 mo
Date Time Interrogation Session: 20160630131849
HighPow Impedance: 55.125
Lead Channel Impedance Value: 450 Ohm
Lead Channel Impedance Value: 462.5 Ohm
Lead Channel Impedance Value: 537.5 Ohm
Lead Channel Pacing Threshold Amplitude: 0.625 V
Lead Channel Pacing Threshold Amplitude: 2.25 V
Lead Channel Pacing Threshold Pulse Width: 0.5 ms
Lead Channel Pacing Threshold Pulse Width: 0.8 ms
Lead Channel Sensing Intrinsic Amplitude: 11.6 mV
Lead Channel Sensing Intrinsic Amplitude: 3.1 mV
Lead Channel Setting Pacing Amplitude: 1.625
Lead Channel Setting Pacing Amplitude: 2 V
Lead Channel Setting Pacing Amplitude: 2.625
Lead Channel Setting Pacing Pulse Width: 0.5 ms
Lead Channel Setting Pacing Pulse Width: 0.8 ms
MDC IDC MSMT LEADCHNL LV PACING THRESHOLD AMPLITUDE: 1.625 V
MDC IDC MSMT LEADCHNL LV PACING THRESHOLD PULSEWIDTH: 0.8 ms
MDC IDC MSMT LEADCHNL RV PACING THRESHOLD AMPLITUDE: 0.625 V
MDC IDC MSMT LEADCHNL RV PACING THRESHOLD PULSEWIDTH: 0.5 ms
MDC IDC SET LEADCHNL RV SENSING SENSITIVITY: 0.5 mV
MDC IDC SET ZONE DETECTION INTERVAL: 300 ms
MDC IDC STAT BRADY RA PERCENT PACED: 4 %
MDC IDC STAT BRADY RV PERCENT PACED: 97 %
Pulse Gen Serial Number: 7288352
Zone Setting Detection Interval: 350 ms

## 2014-11-19 NOTE — Progress Notes (Signed)
Wound check appointment. Steri-strips removed. Wound without redness or edema. Incision edges approximated, wound well healed. Normal device function. RA/RV thresholds, sensing, and impedances consistent with implant measurements. LV (M3-RVc) threshold increase noted---now 3.5V@0 .8 (1.625 @ 0.8 on 6/17). All vectors tested---LV pulse confug changed to M2-RVc (per GT)---CXR ordered. Device programmed w/auto capture on. Histogram distribution appropriate for patient and level of activity. (3) mode switches (<1%)---AT/PVCs. No ventricular arrhythmias noted. Patient educated about wound care, arm mobility, lifting restrictions, shock plan. ROV in 3 months with GT/R.

## 2014-11-26 ENCOUNTER — Encounter: Payer: Self-pay | Admitting: Cardiology

## 2014-11-26 ENCOUNTER — Ambulatory Visit (INDEPENDENT_AMBULATORY_CARE_PROVIDER_SITE_OTHER): Payer: Medicare Other | Admitting: Cardiology

## 2014-11-26 VITALS — BP 124/60 | HR 61 | Ht 73.0 in | Wt 234.2 lb

## 2014-11-26 DIAGNOSIS — I25709 Atherosclerosis of coronary artery bypass graft(s), unspecified, with unspecified angina pectoris: Secondary | ICD-10-CM | POA: Diagnosis not present

## 2014-11-26 DIAGNOSIS — I5043 Acute on chronic combined systolic (congestive) and diastolic (congestive) heart failure: Secondary | ICD-10-CM

## 2014-11-26 DIAGNOSIS — I255 Ischemic cardiomyopathy: Secondary | ICD-10-CM | POA: Diagnosis not present

## 2014-11-26 DIAGNOSIS — Z95810 Presence of automatic (implantable) cardiac defibrillator: Secondary | ICD-10-CM | POA: Diagnosis not present

## 2014-11-26 MED ORDER — FUROSEMIDE 40 MG PO TABS: ORAL_TABLET | ORAL | Status: DC

## 2014-11-26 NOTE — Progress Notes (Signed)
Cardiology Office Note  Date: 11/26/2014   ID: [name], DOB Oct 18, 1945, MRN [id number]  PCP: Austin Fire, MD  Primary Cardiologist: Austin Lesches, MD   Chief Complaint  Patient presents with  . Coronary Artery Disease  . Cardiomyopathy    History of Present Illness: [name] is a medically complex 69 y.o. male last seen in June. In the interim he underwent placement of a St. Jude CRT-D by Dr. Lovena [name]. He states that he is feeling better, no angina and less short of breath. He also has significantly fewer PVCs and his effective heart rate has improved with pacing. He does report fluctuations in his weight, sometimes 5 or 6 pounds within 24 hours. We discussed how he can increase his Lasix dose based on changes in weight. Also given a daily weight chart today. We reviewed his medications and no other specific changes were made today.  We discussed sodium and fluid restriction. Denies any orthopnea or PND. No palpitations or device discharges.a wound check and device check on June 30.   Past Medical History  Diagnosis Date  . Ischemic cardiomyopathy     LVEF 45-50% 11/2011  . Coronary atherosclerosis of native coronary artery     Multivessel status post CABG  . Angioedema     Secondary to ACE inhibitor  . Bell palsy   . Type 2 diabetes mellitus   . AAA (abdominal aortic aneurysm)     Followed by [name]  . Essential hypertension, benign   . PAD (peripheral artery disease)     Followed by [name]  . Carotid artery disease   . Atrial fibrillation   . Hernia of abdominal wall   . AICD (automatic cardioverter/defibrillator) present   . Nephrolithiasis   . Hypercholesteremia   . Myocardial infarction   . Migraines   . Arthritis   . Gout   . Sleep apnea     Past Surgical History  Procedure Laterality Date  . Dupuytren contracture release Left 2009  . Cataract extraction w/phaco  03/23/2011    Procedure: CATARACT EXTRACTION PHACO AND  INTRAOCULAR LENS PLACEMENT (IOC);  Surgeon: [name];  [facility]: [facility];  Service: Ophthalmology;  Laterality: Left;  CDE: 15.99  . Cataract extraction w/phaco  04/17/2011    Procedure: CATARACT EXTRACTION PHACO AND INTRAOCULAR LENS PLACEMENT (IOC);  Surgeon: [name];  Location: [facility];  Service: Ophthalmology;  Laterality: Right;  CDE: 23.45  . Foot surgery Left 1963    "gangrene"  . Eye surgery    . Bypass graft popliteal to popliteal Left 06/08/2014    Procedure: BYPASS GRAFT FEMORAL ARTERY TO ABOVE KNEE POPLITEAL;  Surgeon: Austin Posner, MD;  Location: [facility];  Service: Vascular;  Laterality: Left;  . Abdominal aortic aneurysm repair  November 19, 2013    [facility] :  [name]  . Abdominal aortic aneurysm repair  09-07-2014    [facility]  . Bi-ventricular implantable cardioverter defibrillator  (crt-d)  11/05/2014  . Extracorporeal shock wave lithotripsy  X 1  . Cardiac catheterization N/A 10/02/2014    Procedure: Left Heart Cath and Cors/Grafts Angiography;  Surgeon: Austin Crome, MD;  Location: [facility];  Service: Cardiovascular;  Laterality: N/A;  . Cardiac catheterization  "several"  . Coronary artery bypass graft  2001    LIMA to LAD, SVG to diagonal and ramus, SVG to OM, SVG to AM  . Ep implantable device N/A 11/05/2014    Procedure:  BiV ICD Insertion CRT-D;  Surgeon: Austin Lance, MD;  Location: [place] [facility];  Service: Cardiovascular;  Laterality: N/A;    Current Outpatient Prescriptions  Medication Sig Dispense Refill  . acetaminophen (TYLENOL) 500 MG tablet Take 1,000 mg by mouth 2 (two) times daily as needed for headache (pain).    Marland Kitchen allopurinol (ZYLOPRIM) 100 MG tablet Take 100 mg by mouth daily with supper.     Marland Kitchen amLODipine (NORVASC) 2.5 MG tablet Take 1 tablet (2.5 mg total) by mouth daily. 90 tablet 3  . aspirin EC 81 MG tablet Take 81 mg by mouth daily.    . carvedilol (COREG) 25 MG tablet Take 0.5 tablets (12.5 mg total) by mouth 2 (two) times  daily with a meal. 30 tablet 6  . gabapentin (NEURONTIN) 300 MG capsule Take 300 mg by mouth 2 (two) times daily.    Marland Kitchen glipiZIDE (GLUCOTROL XL) 10 MG 24 hr tablet Take 5 mg by mouth 2 (two) times daily.     . isosorbide mononitrate (IMDUR) 60 MG 24 hr tablet Take 2 tablets (120 mg total) by mouth every morning. & 30 mg in the evening (Patient taking differently: Take 30-120 mg by mouth 2 (two) times daily. Take 2 tablets (120 mg) every morning and 1/2 tablet (30 mg) with supper) 90 tablet 3  . linagliptin (TRADJENTA) 5 MG TABS tablet Take 5 mg by mouth daily.      Marland Kitchen NITROSTAT 0.4 MG SL tablet PLACE 1 TABLET UNDER THE TONGUE EVERY 5 MINUTES UP TO 3 DOSES AS NEEDED FOR CHEST PAIN. 25 tablet 3  . simvastatin (ZOCOR) 20 MG tablet Take 1 tablet (20 mg total) by mouth at bedtime. 90 tablet 3  . furosemide (LASIX) 40 MG tablet Take 40 mg twice a day, may take extra 40 mg daily for weight gain over 3 lbs overnite 180 tablet 3   No current facility-administered medications for this visit.    Allergies:  Ace inhibitors; Codeine; Lisinopril; and Hydromorphone   Social History: The patient  reports that he quit smoking about 7 years ago. His smoking use included Cigarettes. He started smoking about 35 years ago. He has a 25 pack-year smoking history. He has never used smokeless tobacco. He reports that he does not drink alcohol or use illicit drugs.   ROS:  Please see the history of present illness. Otherwise, complete review of systems is positive for occasional rash on his arms when he gets in the sun.  All other systems are reviewed and negative.   Physical Exam: VS:  BP 124/60 mmHg  Pulse 61  Ht 6\' 1"  (1.854 m)  Wt 234 lb 3.2 oz (106.232 kg)  BMI 30.91 kg/m2  SpO2 91%, BMI Body mass index is 30.91 kg/(m^2).  Wt Readings from Last 3 Encounters:  11/26/14 234 lb 3.2 oz (106.232 kg)  11/06/14 224 lb 14.3 oz (102.01 kg)  10/29/14 231 lb (104.781 kg)     General: Obese male, appears comfortable at  rest. HEENT: Conjunctiva and lids normal, oropharynx clear. Neck: Supple, elevated JVP, no carotid bruits, no thyromegaly. Thorax: Well-healed device pocket site with some residual fluid, no erythema or tenderness. Lungs: Clear to auscultation, nonlabored breathing at rest. Cardiac: Indistinct PMI, regular rate and rhythm, no S3, no pericardial rub. Abdomen: Protuberant, nontender, bowel sounds present. Extremities: Trace edema, distal pulses 2+. Skin: Warm and dry. Musculoskeletal: No kyphosis. Neuropsychiatric: Alert and oriented x3, affect grossly appropriate.   ECG: ECG is not ordered today.  Recent Labwork: 09/30/2014: ALT 14*; AST 18 11/05/2014: BUN 15; Creatinine, Ser 1.56*; Hemoglobin 11.5*; Platelets 351; Potassium 3.8; Sodium 140     Component Value Date/Time   CHOL 118 10/16/2014 0715   TRIG 100 10/16/2014 0715   HDL 33* 10/16/2014 0715   CHOLHDL 3.6 10/16/2014 0715   VLDL 20 10/16/2014 0715   LDLCALC 65 10/16/2014 0715    Other Studies Reviewed Today:  Cardiac catheterization 10/02/2014:  Bypass graft failure with total occlusion of the saphenous vein graft to the acute marginal of the right coronary, total occlusion of the sequential saphenous vein graft to the diagonal and ramus intermedius, and total occlusion of the saphenous graft to the distal circumflex/obtuse marginal.  Patent LIMA to the LAD.  Total occlusion of the native LAD, the mid right coronary, second obtuse marginal, the first diagonal, and the ramus intermedius.  The distal right coronary, the obtuse marginal, ramus intermedius are collateralized from the native circulation via the LIMA to LAD.  Severe left ventricular systolic dysfunction with an inferobasal aneurysm, and an ejection fraction of 20-25%.  ASSESSMENT AND PLAN:  1. Severe multivessel CAD and graft disease with patent LIMA to the LAD by most recent cardiac catheterization. No active angina symptoms on present medical regimen.  Continue observation.  2. Ischemic cardiomyopathy with LVEF 20-25% now status post St. Jude CRT-D by Dr. Lovena [name]. Patient is feeling better symptomatically, may be related to pacing and much fewer PVCs. Hopefully he will also show some improvement in LVEF.  3. Acute on chronic combined heart failure. We have discussed increasing Lasix dose based on weight change. "Dry" weight is probably in the low 220s.  4. CKD stage 3, recent creatinine 1.5.  Current medicines were reviewed at length with the patient today.  Disposition: FU with me in 2 months.   Signed, Satira Sark, MD, [facility] 11/26/2014 9:45 AM    [facility] [address] [place], [place] [address] Phone: [phone]; Fax: [phone]

## 2014-11-26 NOTE — Patient Instructions (Signed)
Your physician recommends that you schedule a follow-up appointment in: 2 months with Dr Domenic Polite    Your physician recommends that you continue on your current medications as directed. Please refer to the Current Medication list given to you today.      Thank you for choosing [facility] !

## 2014-12-02 ENCOUNTER — Telehealth: Payer: Self-pay | Admitting: *Deleted

## 2014-12-02 NOTE — Telephone Encounter (Signed)
Called patient and let him know his CXR from 11/19/14 looked good and all was okay with his LV lead

## 2014-12-07 NOTE — Patient Outreach (Signed)
[facility]) Care Management  12/07/2014  [name] 01/01/1946 [id number]   Referral from [place] [facility], assigned Sherrin Daisy, RN to outreach.  Ronnell Freshwater. [place], [place] Management [place] Assistant Phone: [phone] Fax: [phone]

## 2014-12-21 ENCOUNTER — Encounter: Payer: Medicare Other | Admitting: Nurse Practitioner

## 2014-12-23 DIAGNOSIS — E785 Hyperlipidemia, unspecified: Secondary | ICD-10-CM | POA: Diagnosis not present

## 2014-12-23 DIAGNOSIS — I714 Abdominal aortic aneurysm, without rupture: Secondary | ICD-10-CM | POA: Diagnosis not present

## 2014-12-23 DIAGNOSIS — E119 Type 2 diabetes mellitus without complications: Secondary | ICD-10-CM | POA: Diagnosis not present

## 2014-12-23 DIAGNOSIS — I509 Heart failure, unspecified: Secondary | ICD-10-CM | POA: Diagnosis not present

## 2014-12-23 DIAGNOSIS — I2510 Atherosclerotic heart disease of native coronary artery without angina pectoris: Secondary | ICD-10-CM | POA: Diagnosis not present

## 2014-12-23 DIAGNOSIS — E669 Obesity, unspecified: Secondary | ICD-10-CM | POA: Diagnosis not present

## 2014-12-23 DIAGNOSIS — N200 Calculus of kidney: Secondary | ICD-10-CM | POA: Diagnosis not present

## 2014-12-23 DIAGNOSIS — I5022 Chronic systolic (congestive) heart failure: Secondary | ICD-10-CM | POA: Diagnosis not present

## 2014-12-23 DIAGNOSIS — I10 Essential (primary) hypertension: Secondary | ICD-10-CM | POA: Diagnosis not present

## 2014-12-29 ENCOUNTER — Encounter: Payer: Self-pay | Admitting: Cardiology

## 2014-12-31 ENCOUNTER — Other Ambulatory Visit: Payer: Self-pay

## 2014-12-31 NOTE — Patient Outreach (Signed)
[place] [facility]) Care Management  12/31/2014  [name] 12-16-1945 [id number]   Patient referred due to high risk.    Repeated unsuccessful attempts this date to reach patient.  Candie Mile, RN, MSN [place] [phone] Fax [phone]

## 2015-01-04 ENCOUNTER — Other Ambulatory Visit: Payer: Self-pay

## 2015-01-04 NOTE — Patient Outreach (Signed)
[facility]) Care Management  01/04/2015  [name] 20-Oct-1945 [id number]   Second unsuccessful attempt to reach patient by phone.   Candie Mile, RN, MSN [place] [phone] Fax [phone]

## 2015-01-14 ENCOUNTER — Other Ambulatory Visit: Payer: Self-pay

## 2015-01-14 NOTE — Patient Outreach (Signed)
[facility]) Care Management  01/14/2015  [name] [birth year] [id number]   Unsuccessful attempt to reach patient by phone.  HIPPA appropriate message left requesting a call back.  Candie Mile, RN, MSN [place] [phone] Fax [phone]

## 2015-01-26 ENCOUNTER — Ambulatory Visit: Payer: Medicare Other | Admitting: Cardiology

## 2015-01-28 ENCOUNTER — Ambulatory Visit: Payer: Medicare Other | Admitting: Cardiology

## 2015-01-28 ENCOUNTER — Other Ambulatory Visit: Payer: Self-pay

## 2015-01-28 NOTE — Patient Outreach (Signed)
[facility]) Care Management  01/28/2015  Link Mcfeeley Salasar 1945/12/17 [id number]   Received call back from Mr. Deren.  Explanation of [facility] services provided.  Scheduled assessment for 02-02-15 at 2PM.  Candie Mile, RN, MSN [place] [phone] Fax [phone]

## 2015-02-02 ENCOUNTER — Other Ambulatory Visit: Payer: Self-pay

## 2015-02-02 ENCOUNTER — Ambulatory Visit: Payer: Self-pay

## 2015-02-02 NOTE — Patient Outreach (Signed)
[facility]) Care Management  02/02/2015  Breeze Schackmann Rettke [birth year] [id number]   Unsuccessful call for scheduled assessment.  Message left with contact information.  Will attempt to reach patient tomorrow.  Candie Mile, RN, MSN [place] [phone] Fax [phone]

## 2015-02-03 ENCOUNTER — Other Ambulatory Visit: Payer: Self-pay

## 2015-02-03 DIAGNOSIS — E1110 Type 2 diabetes mellitus with ketoacidosis without coma: Secondary | ICD-10-CM

## 2015-02-03 DIAGNOSIS — I519 Heart disease, unspecified: Secondary | ICD-10-CM

## 2015-02-03 NOTE — Patient Outreach (Signed)
[facility]) Care Management  [place]  02/03/2015   Hicks Krizman Draughn April 16, 1946 [id number]  Subjective: Patient reports problems with managing his diabetes and heart disease.  Has been adjusting the doses of his diabetic medications, and reports he does not adjust lasix dose based on weight gain or fluid retention.  Reports he usually checks his blood sugar each day, either fasting or non-fasting.  Reports reading of 113 non-fasting during our conversation.  Reports he fears losing consciousness due to hypoglycemia, especially since he lives alone.   Current Medications:  Current Outpatient Prescriptions  Medication Sig Dispense Refill  . acetaminophen (TYLENOL) 500 MG tablet Take 1,000 mg by mouth 2 (two) times daily as needed for headache (pain).    Marland Kitchen allopurinol (ZYLOPRIM) 100 MG tablet Take 100 mg by mouth daily with supper.     Marland Kitchen amLODipine (NORVASC) 2.5 MG tablet Take 1 tablet (2.5 mg total) by mouth daily. 90 tablet 3  . aspirin EC 81 MG tablet Take 81 mg by mouth daily.    . carvedilol (COREG) 25 MG tablet Take 0.5 tablets (12.5 mg total) by mouth 2 (two) times daily with a meal. 30 tablet 6  . furosemide (LASIX) 40 MG tablet Take 40 mg twice a day, may take extra 40 mg daily for weight gain over 3 lbs overnite 180 tablet 3  . gabapentin (NEURONTIN) 300 MG capsule Take 300 mg by mouth 2 (two) times daily.    Marland Kitchen glipiZIDE (GLUCOTROL XL) 10 MG 24 hr tablet Take 5 mg by mouth 2 (two) times daily.     . isosorbide mononitrate (IMDUR) 60 MG 24 hr tablet Take 2 tablets (120 mg total) by mouth every morning. & 30 mg in the evening (Patient taking differently: Take 30-120 mg by mouth 2 (two) times daily. Take 2 tablets (120 mg) every morning and 1/2 tablet (30 mg) with supper) 90 tablet 3  . linagliptin (TRADJENTA) 5 MG TABS tablet Take 5 mg by mouth daily.      Marland Kitchen NITROSTAT 0.4 MG SL tablet PLACE 1 TABLET UNDER THE TONGUE EVERY 5 MINUTES UP TO 3 DOSES AS NEEDED  FOR CHEST PAIN. 25 tablet 3  . simvastatin (ZOCOR) 20 MG tablet Take 1 tablet (20 mg total) by mouth at bedtime. 90 tablet 3   No current facility-administered medications for this visit.    Functional Status:  In your present state of health, do you have any difficulty performing the following activities: 11/05/2014 11/05/2014  Hearing? - -  Vision? - -  Difficulty concentrating or making decisions? - -  Walking or climbing stairs? - Y  Dressing or bathing? - -  Doing errands, shopping? N -     Assessment: Unable to complete all of initial assessment data this date.  Patient is agreeable to referral to[facility] home care nurse and pharmacist to complete evaluation and assist with needs for self-management of diabetes and heart disease.  He may need a social work referral for financial concerns but was unwilling to accept referral at this time.  Plan: Encouraged patient to keep MD appointments, including an appointment tomorrow with Dr. Lovena Le.           Instructed to check weight, blood pressure, and blood sugar each day and keep a record.           Referrals made for community nurse and pharmacy.           Welcome packet was mailed after earlier screening phone  call, and patient states he has received it and                      will review it.           Encouraged patient to call MD and/or 24 hour nurse line with questions or concerns.  Candie Mile, RN, MSN [place] [phone] Fax [phone]

## 2015-02-03 NOTE — Progress Notes (Signed)
This encounter was created in error - please disregard.

## 2015-02-03 NOTE — Patient Outreach (Addendum)
[facility]) Care Management  02/03/2015  Onix Barberi Gehling [birth year] [id number]   Request from Candie Mile, RN to assign Pharmacy and Community RN, assigned Deanne Coffer, PharmD and Jacqlyn Larsen, RN.  Thanks, Ronnell Freshwater. [place], [place] Assistant Phone: [phone] Fax: [phone]

## 2015-02-04 ENCOUNTER — Encounter: Payer: Self-pay | Admitting: Internal Medicine

## 2015-02-04 ENCOUNTER — Ambulatory Visit (INDEPENDENT_AMBULATORY_CARE_PROVIDER_SITE_OTHER): Payer: Medicare Other | Admitting: Internal Medicine

## 2015-02-04 ENCOUNTER — Other Ambulatory Visit: Payer: Self-pay | Admitting: *Deleted

## 2015-02-04 VITALS — BP 130/70 | HR 85 | Ht 73.0 in | Wt 242.2 lb

## 2015-02-04 DIAGNOSIS — I10 Essential (primary) hypertension: Secondary | ICD-10-CM

## 2015-02-04 DIAGNOSIS — I5022 Chronic systolic (congestive) heart failure: Secondary | ICD-10-CM | POA: Diagnosis not present

## 2015-02-04 DIAGNOSIS — I255 Ischemic cardiomyopathy: Secondary | ICD-10-CM

## 2015-02-04 DIAGNOSIS — Z95810 Presence of automatic (implantable) cardiac defibrillator: Secondary | ICD-10-CM | POA: Diagnosis not present

## 2015-02-04 DIAGNOSIS — I739 Peripheral vascular disease, unspecified: Secondary | ICD-10-CM | POA: Insufficient documentation

## 2015-02-04 DIAGNOSIS — I493 Ventricular premature depolarization: Secondary | ICD-10-CM

## 2015-02-04 DIAGNOSIS — I447 Left bundle-branch block, unspecified: Secondary | ICD-10-CM | POA: Diagnosis not present

## 2015-02-04 LAB — CUP PACEART INCLINIC DEVICE CHECK
Brady Statistic RV Percent Paced: 96 %
Date Time Interrogation Session: 20160915143526
HighPow Impedance: 65.25 Ohm
Lead Channel Impedance Value: 525 Ohm
Lead Channel Impedance Value: 600 Ohm
Lead Channel Pacing Threshold Amplitude: 0.625 V
Lead Channel Pacing Threshold Amplitude: 1.75 V
Lead Channel Pacing Threshold Amplitude: 1.75 V
Lead Channel Pacing Threshold Pulse Width: 0.8 ms
Lead Channel Pacing Threshold Pulse Width: 0.8 ms
Lead Channel Sensing Intrinsic Amplitude: 3 mV
Lead Channel Setting Pacing Amplitude: 1.625
Lead Channel Setting Pacing Amplitude: 2 V
Lead Channel Setting Pacing Amplitude: 2.75 V
Lead Channel Setting Pacing Pulse Width: 0.5 ms
Lead Channel Setting Pacing Pulse Width: 0.8 ms
Lead Channel Setting Sensing Sensitivity: 0.5 mV
MDC IDC MSMT BATTERY REMAINING LONGEVITY: 62.4 mo
MDC IDC MSMT LEADCHNL RA IMPEDANCE VALUE: 475 Ohm
MDC IDC MSMT LEADCHNL RA PACING THRESHOLD PULSEWIDTH: 0.5 ms
MDC IDC MSMT LEADCHNL RV PACING THRESHOLD AMPLITUDE: 0.75 V
MDC IDC MSMT LEADCHNL RV PACING THRESHOLD PULSEWIDTH: 0.5 ms
MDC IDC MSMT LEADCHNL RV SENSING INTR AMPL: 11.6 mV
MDC IDC SET ZONE DETECTION INTERVAL: 300 ms
MDC IDC STAT BRADY RA PERCENT PACED: 10 %
Pulse Gen Serial Number: 7288352
Zone Setting Detection Interval: 350 ms

## 2015-02-04 NOTE — Patient Outreach (Signed)
02/04/15- 1:04 pm-  Telephone call to patient to set up initial home visit, no answer to telephone, left voicemail requesting return phone call.  Jacqlyn Larsen [facility], [place] Coordinator [phone]

## 2015-02-04 NOTE — Assessment & Plan Note (Signed)
He denies anginal symptoms. He is sedentary. I encouraged him to increase his physical activity.

## 2015-02-04 NOTE — Progress Notes (Signed)
HPI [name] returns today for ongoing ICD followup. He is a pleasant 69 yo man with chronic systolic heart failure, CAD, s/p CABG, LBBB, who underwent catheterization several weeks ago. He was found to have an EF 25%. He has class 3 heart failure and a QRS duration of 165 ms with LBBB. He has not had syncope. He notes that he has felt poorly since undergoing AAA revision for stent-graft leak. He has been stable since his ICD Implant. He does note some PVC"s and right sided claudication and generalzied weakness.  Allergies  Allergen Reactions  . Ace Inhibitors Anaphylaxis  . Codeine Other (See Comments)     delirium  . Lisinopril Anaphylaxis and Swelling  . Hydromorphone Nausea And Vomiting     Current Outpatient Prescriptions  Medication Sig Dispense Refill  . acetaminophen (TYLENOL) 500 MG tablet Take 1,000 mg by mouth 2 (two) times daily as needed for headache (pain).    Marland Kitchen allopurinol (ZYLOPRIM) 100 MG tablet Take 100 mg by mouth daily with supper.     Marland Kitchen amLODipine (NORVASC) 2.5 MG tablet Take 1 tablet (2.5 mg total) by mouth daily. 90 tablet 3  . aspirin EC 81 MG tablet Take 81 mg by mouth daily.    . carvedilol (COREG) 25 MG tablet Take 0.5 tablets (12.5 mg total) by mouth 2 (two) times daily with a meal. 30 tablet 6  . furosemide (LASIX) 40 MG tablet Take 40 mg twice a day, may take extra 40 mg daily for weight gain over 3 lbs overnite 180 tablet 3  . gabapentin (NEURONTIN) 300 MG capsule Take 300 mg by mouth 2 (two) times daily.    Marland Kitchen glipiZIDE (GLUCOTROL XL) 10 MG 24 hr tablet Take 5 mg by mouth 2 (two) times daily.     . isosorbide mononitrate (IMDUR) 60 MG 24 hr tablet Take 2 tablets (120 mg total) by mouth every morning. & 30 mg in the evening (Patient taking differently: Take 30-120 mg by mouth 2 (two) times daily. Take 2 tablets (120 mg) every morning and 1/2 tablet (30 mg) with supper) 90 tablet 3  . linagliptin (TRADJENTA) 5 MG TABS tablet Take 5 mg by mouth daily.       Marland Kitchen NITROSTAT 0.4 MG SL tablet PLACE 1 TABLET UNDER THE TONGUE EVERY 5 MINUTES UP TO 3 DOSES AS NEEDED FOR CHEST PAIN. 25 tablet 3  . simvastatin (ZOCOR) 20 MG tablet Take 1 tablet (20 mg total) by mouth at bedtime. 90 tablet 3   No current facility-administered medications for this visit.     Past Medical History  Diagnosis Date  . Ischemic cardiomyopathy     LVEF 45-50% 11/2011  . Coronary atherosclerosis of native coronary artery     Multivessel status post CABG  . Angioedema     Secondary to ACE inhibitor  . Bell palsy   . Type 2 diabetes mellitus   . AAA (abdominal aortic aneurysm)     Followed by Dr. Donnetta Hutching  . Essential hypertension, benign   . PAD (peripheral artery disease)     Followed by Dr. Donnetta Hutching  . Carotid artery disease   . Atrial fibrillation   . Hernia of abdominal wall   . AICD (automatic cardioverter/defibrillator) present   . Nephrolithiasis   . Hypercholesteremia   . Myocardial infarction   . Migraines   . Arthritis   . Gout   . Sleep apnea     ROS:   All systems reviewed  and negative except as noted in the HPI.   Past Surgical History  Procedure Laterality Date  . Dupuytren contracture release Left 2009  . Cataract extraction w/phaco  03/23/2011    Procedure: CATARACT EXTRACTION PHACO AND INTRAOCULAR LENS PLACEMENT (IOC);  Surgeon: Tonny Branch;  Location: [facility];  Service: Ophthalmology;  Laterality: Left;  CDE: 15.99  . Cataract extraction w/phaco  04/17/2011    Procedure: CATARACT EXTRACTION PHACO AND INTRAOCULAR LENS PLACEMENT (IOC);  Surgeon: Tonny Branch;  Location: [facility];  Service: Ophthalmology;  Laterality: Right;  CDE: 23.45  . Foot surgery Left 1963    "gangrene"  . Eye surgery    . Bypass graft popliteal to popliteal Left 06/08/2014    Procedure: BYPASS GRAFT FEMORAL ARTERY TO ABOVE KNEE POPLITEAL;  Surgeon: Rosetta Posner, MD;  Location: [place];  Service: Vascular;  Laterality: Left;  . Abdominal aortic aneurysm repair  November 19, 2013     [facility] :  Dr. Sammuel Hines  . Abdominal aortic aneurysm repair  09-07-2014    [facility]  . Bi-ventricular implantable cardioverter defibrillator  (crt-d)  11/05/2014  . Extracorporeal shock wave lithotripsy  X 1  . Cardiac catheterization N/A 10/02/2014    Procedure: Left Heart Cath and Cors/Grafts Angiography;  Surgeon: Belva Crome, MD;  Location: [place] [facility];  Service: Cardiovascular;  Laterality: N/A;  . Cardiac catheterization  "several"  . Coronary artery bypass graft  2001    LIMA to LAD, SVG to diagonal and ramus, SVG to OM, SVG to AM  . Ep implantable device N/A 11/05/2014    Procedure: BiV ICD Insertion CRT-D;  Surgeon: Evans Lance, MD;  Location: [place] [facility];  Service: Cardiovascular;  Laterality: N/A;     Family History  Problem Relation Age of Onset  . Diabetes Mother     Type  I   . Varicose Veins Mother   . Heart disease Father 91    AAA  . AAA (abdominal aortic aneurysm) Father   . Hyperlipidemia Father   . Hypertension Father      Social History   Social History  . Marital Status: Divorced    Spouse Name: N/A  . Number of Children: N/A  . Years of Education: N/A   Occupational History  . Not on file.   Social History Main Topics  . Smoking status: Former Smoker -- 1.00 packs/day for 25 years    Types: Cigarettes    Start date: 02/28/1979    Quit date: 05/23/2007  . Smokeless tobacco: Never Used  . Alcohol Use: No  . Drug Use: No  . Sexual Activity: No   Other Topics Concern  . Not on file   Social History Narrative     BP 130/70 mmHg  Pulse 85  Ht 6\' 1"  (1.854 m)  Wt 242 lb 3.2 oz (109.861 kg)  BMI 31.96 kg/m2  SpO2 95%  Physical Exam:  Well appearing 69 yo man, NAD HEENT: Unremarkable Neck:  7 cm JVD, no thyromegally Back:  No CVA tenderness Lungs:  Clear with no wheezes HEART:  Regular rate rhythm, no murmurs, no rubs, no clicks Abd:  soft, positive bowel sounds, no organomegally, no rebound, no  guarding Ext:   no edema, no cyanosis, no clubbing Skin:  No rashes no nodules Neuro:  CN II through XII intact, motor grossly intact  EKG - nsr with PVC's and BiV Pacing    Assess/Plan:

## 2015-02-04 NOTE — Assessment & Plan Note (Signed)
His symptoms are fairly severe and limiting. I have asked that he get on the exercise bike and see if he improves. Might consider referral back to Dr. Gwenlyn Found.

## 2015-02-04 NOTE — Patient Instructions (Signed)
Your physician recommends that you schedule a follow-up appointment in: 3 months with Dr. Lovena Le  Your physician recommends that you continue on your current medications as directed. Please refer to the Current Medication list given to you today.  Thank you for choosing [facility]!

## 2015-02-04 NOTE — Assessment & Plan Note (Signed)
He is s/p BiV ICD. Today we reprogrammed his device to maximize battery longevity. We would consider multi-site pacing in the future if dyspnea becomes his limiting symptom.

## 2015-02-04 NOTE — Assessment & Plan Note (Signed)
He was asymptomatic for a while after his implant but now c/o symptomatic PVC's. His burden over the past 3 months is only 4%. I have asked that the patient avoid caffeine. Will decide when we see him back whether to add an anti-arrhythmic drug.

## 2015-02-04 NOTE — Assessment & Plan Note (Signed)
His symptoms are class 2B. He is limited most by claudication. No change in meds.

## 2015-02-05 ENCOUNTER — Other Ambulatory Visit: Payer: Self-pay | Admitting: Pharmacist

## 2015-02-05 NOTE — Patient Outreach (Signed)
[facility]) Care Management  [place]   02/05/2015  Carver Georgia Shawhan 08-10-1945 [id number]  Subjective: [name] is a 69 y.o. male who was referred to [place] for medication review and education.   Objective:   Current Medications: Current Outpatient Prescriptions  Medication Sig Dispense Refill  . acetaminophen (TYLENOL) 500 MG tablet Take 1,000 mg by mouth 2 (two) times daily as needed for headache (pain).    Marland Kitchen allopurinol (ZYLOPRIM) 100 MG tablet Take 100 mg by mouth daily with supper.     Marland Kitchen amLODipine (NORVASC) 2.5 MG tablet Take 1 tablet (2.5 mg total) by mouth daily. 90 tablet 3  . aspirin EC 81 MG tablet Take 81 mg by mouth daily.    . carvedilol (COREG) 25 MG tablet Take 0.5 tablets (12.5 mg total) by mouth 2 (two) times daily with a meal. 30 tablet 6  . furosemide (LASIX) 40 MG tablet Take 40 mg twice a day, may take extra 40 mg daily for weight gain over 3 lbs overnite 180 tablet 3  . gabapentin (NEURONTIN) 300 MG capsule Take 300 mg by mouth 2 (two) times daily.    Marland Kitchen glipiZIDE (GLUCOTROL XL) 10 MG 24 hr tablet Take 5 mg by mouth 2 (two) times daily.     . isosorbide mononitrate (IMDUR) 60 MG 24 hr tablet Take 2 tablets (120 mg total) by mouth every morning. & 30 mg in the evening (Patient taking differently: Take 30-120 mg by mouth 2 (two) times daily. Take 2 tablets (120 mg) every morning and 1/2 tablet (30 mg) with supper) 90 tablet 3  . linagliptin (TRADJENTA) 5 MG TABS tablet Take 5 mg by mouth daily.      Marland Kitchen NITROSTAT 0.4 MG SL tablet PLACE 1 TABLET UNDER THE TONGUE EVERY 5 MINUTES UP TO 3 DOSES AS NEEDED FOR CHEST PAIN. 25 tablet 3  . simvastatin (ZOCOR) 20 MG tablet Take 1 tablet (20 mg total) by mouth at bedtime. 90 tablet 3   No current facility-administered medications for this visit.    Functional Status: In your present state of health, do you have any difficulty performing the following activities: 02/03/2015  11/05/2014  Hearing? N -  Vision? N -  Difficulty concentrating or making decisions? N -  Walking or climbing stairs? Y -  Dressing or bathing? N -  Doing errands, shopping? N N    Fall/Depression Screening: No flowsheet data found.  Assessment:  Drugs sorted by system:  Neurologic/Psychologic: gabapentin  Cardiovascular: amlodipine, aspirin, carvedilol, furosemide, isosorbide mononitrate, nitrostat, simvastatin  Pulmonary/Allergy: none noted  Gastrointestinal: none noted  Endocrine: glipizide, linagliptin, metformin  Renal: none noted  Topical: none noted  Pain: acetaminophen  Miscellaneous: allopurinol   Duplications in therapy: none noted Gaps in therapy: patient not on ACEi but has contraindication to use (anaphylaxis) - this was documented in the quality metrics tab Medications to avoid in the elderly: none noted Drug interactions: none noted  Other issues noted: renal function - last SCr was 1.56, all doses of medication appropriate EXCEPT need to get update renal function for metformin because use is contraindicated in GFR <30  1. Medication review: issues noted above 2. Medication education: patient's phone was dying and he would like to review his medications to understand more of what they do. He is open to discussing this on the phone.   Plan: 1. Medication review: will send fax to Dr. Legrand Rams to recommending close monitoring of GFR with metformin  and to let him know that patient has been experiencing multiple hypoglycemic episodes since restarting his medications.  2. Medication education: I will call patient in one week (02/12/15) to review his medications with him over the phone. If further assistance is needed, we can plan a home visit.     Nicoletta Ba, PharmD, [facility] [phone]

## 2015-02-11 ENCOUNTER — Other Ambulatory Visit: Payer: Self-pay | Admitting: *Deleted

## 2015-02-11 NOTE — Patient Outreach (Signed)
02/11/15- Second outreach attempt to schedule initial home visit, no answer to telephone, left voicemail requesting return phone call.  Jacqlyn Larsen [facility], [place] Coordinator [phone]

## 2015-02-12 ENCOUNTER — Ambulatory Visit: Payer: Self-pay | Admitting: Pharmacist

## 2015-02-12 ENCOUNTER — Other Ambulatory Visit: Payer: Self-pay | Admitting: *Deleted

## 2015-02-12 ENCOUNTER — Other Ambulatory Visit: Payer: Self-pay | Admitting: Pharmacist

## 2015-02-12 NOTE — Patient Outreach (Signed)
02/12/15- RN CM talked with pt to schedule initial home visit, (pt had previously agreed to [employer]),  HIPAA verified, home visit scheduled for 03/18/15.  Jacqlyn Larsen [facility], [place] Coordinator [phone]

## 2015-02-12 NOTE — Patient Outreach (Signed)
[facility]) Care Management  [place]   02/12/2015  Adith Glendenning Rask 1946-02-25 [id number]  Subjective: JL MANDLER is a 69 y.o. male who was referred to [place] for medication review and education.   Objective:   Current Medications: Current Outpatient Prescriptions  Medication Sig Dispense Refill  . acetaminophen (TYLENOL) 500 MG tablet Take 1,000 mg by mouth 2 (two) times daily as needed for headache (pain).    Marland Kitchen allopurinol (ZYLOPRIM) 100 MG tablet Take 100 mg by mouth daily with supper.     Marland Kitchen amLODipine (NORVASC) 2.5 MG tablet Take 1 tablet (2.5 mg total) by mouth daily. 90 tablet 3  . aspirin EC 81 MG tablet Take 81 mg by mouth daily.    . carvedilol (COREG) 25 MG tablet Take 0.5 tablets (12.5 mg total) by mouth 2 (two) times daily with a meal. 30 tablet 6  . furosemide (LASIX) 40 MG tablet Take 40 mg twice a day, may take extra 40 mg daily for weight gain over 3 lbs overnite 180 tablet 3  . gabapentin (NEURONTIN) 300 MG capsule Take 300 mg by mouth 2 (two) times daily.    Marland Kitchen glipiZIDE (GLUCOTROL XL) 10 MG 24 hr tablet Take 5 mg by mouth 2 (two) times daily.     . isosorbide mononitrate (IMDUR) 60 MG 24 hr tablet Take 2 tablets (120 mg total) by mouth every morning. & 30 mg in the evening (Patient taking differently: Take 30-120 mg by mouth 2 (two) times daily. Take 2 tablets (120 mg) every morning and 1/2 tablet (30 mg) with supper) 90 tablet 3  . linagliptin (TRADJENTA) 5 MG TABS tablet Take 5 mg by mouth daily.      . metFORMIN (GLUCOPHAGE) 500 MG tablet Take by mouth 2 (two) times daily with a meal.    . NITROSTAT 0.4 MG SL tablet PLACE 1 TABLET UNDER THE TONGUE EVERY 5 MINUTES UP TO 3 DOSES AS NEEDED FOR CHEST PAIN. 25 tablet 3  . simvastatin (ZOCOR) 20 MG tablet Take 1 tablet (20 mg total) by mouth at bedtime. 90 tablet 3   No current facility-administered medications for this visit.    Functional Status: In your present state  of health, do you have any difficulty performing the following activities: 02/03/2015 11/05/2014  Hearing? N -  Vision? N -  Difficulty concentrating or making decisions? N -  Walking or climbing stairs? Y -  Dressing or bathing? N -  Doing errands, shopping? N N    Fall/Depression Screening: No flowsheet data found.  Assessment: 1. Medication education: patient in need of medication education and interested in learning more about how his medications work.    Plan: 1. I called the patient to discuss his medications and I had to leave a HIPAA compliant message for patient to return my phone call.  I will reach out on 02/19/15.     Nicoletta Ba, PharmD, [facility] [phone]

## 2015-02-16 ENCOUNTER — Encounter: Payer: Self-pay | Admitting: *Deleted

## 2015-02-16 ENCOUNTER — Telehealth: Payer: Self-pay | Admitting: Cardiology

## 2015-02-16 ENCOUNTER — Other Ambulatory Visit: Payer: Self-pay | Admitting: *Deleted

## 2015-02-16 NOTE — Telephone Encounter (Signed)
Pt called and stated that he has been having palpitations since early Monday morning. He feels it more in his left side rather than his chest. Pt states that he feels weak, pt states that he has had some shortness of breath while sitting. Pt is going to send a remote transmission.

## 2015-02-16 NOTE — Telephone Encounter (Signed)
Returning patient call- remote transmission normal. Thoracic impedence stable. Thresholds, impedence and sensing consistent. Pt reporting "hard heart beats" that he can feel on his left side. He says that he doesn't feel like it is his muscle but he hasn't felt his heart beat like that previously. He will see Dr. Domenic Polite 02/17/15 and he was scheduled to check for diaphragmatic stimulation in [place] 02/19/15 at 11am. Explained to patient that diaphragmatic stimulation is uncomfortable but not harmful. Pt agreeable with plan.

## 2015-02-16 NOTE — Patient Outreach (Addendum)
[name]) Care Management   02/16/2015  [name] 24-Aug-1945 [id number]  [name] is an 69 y.o. male  Subjective: Initial home visit with pt, HIPAA verified, pt states "I'm so weak all the time, wish I had some energy".  Pt states he was just on the phone with nurse at heart doctor about "my heart beating funny sometime"  Pt to see Dr. Domenic Polite tomorrow for ultrasound and to see someone in Dr. Tanna Furry office on Friday, pt reports he has ICD and pacemaker.  Pt states he called 24 hour nurse line and was instructed to go to ED but refuses to go, pt then called cardiologist office for direction, pt states cardiologist office instructed pt to go to ED and pt refused.  Pt states he does not have money left over monthly to buy extra gas to go exercise at [facility] or be involved in other things.  Pt states " I haven't checked my blood sugar since 02/06/15" states last reading was 69 fasting, other readings 137, 301.   Objective:   Filed Vitals:   02/16/15 1233  BP: 128/62  Pulse: 76  Resp: 16  Height: 1.854 m (6\' 1" )  Weight: 235 lb (106.595 kg)  SpO2: 97%   ROS  Physical Exam  Constitutional: He is oriented to person, place, and time. He appears well-developed.  HENT:  Head: Normocephalic.  Neck: Normal range of motion.  Cardiovascular: Normal rate.   Irregular rhythm noted, pt says he has PVC's  Respiratory: Effort normal and breath sounds normal.  GI: Soft. Bowel sounds are normal.  Musculoskeletal: Normal range of motion. He exhibits no edema.  Neurological: He is alert and oriented to person, place, and time.  Skin: Skin is warm and dry.  Psychiatric: He has a normal mood and affect. His behavior is normal. Judgment and thought content normal.    Current Medications:   Current Outpatient Prescriptions  Medication Sig Dispense Refill  . acetaminophen (TYLENOL) 500 MG tablet Take 1,000 mg by mouth 2 (two) times daily as needed for headache  (pain).    Marland Kitchen allopurinol (ZYLOPRIM) 100 MG tablet Take 100 mg by mouth daily with supper.     Marland Kitchen amLODipine (NORVASC) 2.5 MG tablet Take 1 tablet (2.5 mg total) by mouth daily. 90 tablet 3  . aspirin EC 81 MG tablet Take 81 mg by mouth daily.    . carvedilol (COREG) 25 MG tablet Take 0.5 tablets (12.5 mg total) by mouth 2 (two) times daily with a meal. 30 tablet 6  . furosemide (LASIX) 40 MG tablet Take 40 mg twice a day, may take extra 40 mg daily for weight gain over 3 lbs overnite 180 tablet 3  . gabapentin (NEURONTIN) 300 MG capsule Take 300 mg by mouth 2 (two) times daily.    Marland Kitchen glipiZIDE (GLUCOTROL XL) 10 MG 24 hr tablet Take 5 mg by mouth 2 (two) times daily.     . isosorbide mononitrate (IMDUR) 60 MG 24 hr tablet Take 2 tablets (120 mg total) by mouth every morning. & 30 mg in the evening (Patient taking differently: Take 30-120 mg by mouth 2 (two) times daily. Take 2 tablets (120 mg) every morning and 1/2 tablet (30 mg) with supper) 90 tablet 3  . linagliptin (TRADJENTA) 5 MG TABS tablet Take 5 mg by mouth daily.      . metFORMIN (GLUCOPHAGE) 500 MG tablet Take by mouth 2 (two) times daily with a meal.    .  NITROSTAT 0.4 MG SL tablet PLACE 1 TABLET UNDER THE TONGUE EVERY 5 MINUTES UP TO 3 DOSES AS NEEDED FOR CHEST PAIN. 25 tablet 3  . simvastatin (ZOCOR) 20 MG tablet Take 1 tablet (20 mg total) by mouth at bedtime. 90 tablet 3   No current facility-administered medications for this visit.    Functional Status:   In your present state of health, do you have any difficulty performing the following activities: 02/16/2015 02/03/2015  Hearing? N N  Vision? N N  Difficulty concentrating or making decisions? N N  Walking or climbing stairs? Y Y  Dressing or bathing? N N  Doing errands, shopping? N N  Preparing Food and eating ? N -  Using the Toilet? N -  In the past six months, have you accidently leaked urine? N -  Do you have problems with loss of bowel control? N -  Managing your  Medications? N -  Managing your Finances? N -  Housekeeping or managing your Housekeeping? N -    Fall/Depression Screening:    PHQ 2/9 Scores 02/16/2015  PHQ - 2 Score 0   Fall Risk  02/16/2015  Falls in the past year? Yes  Number falls in past yr: 1  Injury with Fall? No  Risk for fall due to : Impaired balance/gait;Medication side effect  Follow up Falls evaluation completed;Education provided;Falls prevention discussed    Assessment:  RN CM talked with pt about applying for scholarship at [facility] so pt could participate in physical activity and for socialization. Pt says he will think about it, RN CM gave pt phone number to [place].  RN CM encouraged pt to walk some daily, even if for few minutes and slowly add time onto this and to discuss exercise with his MD.  Pt says he does not qualify for food stamps or medicaid because of income level and verbalizes how to contact and go to social services if he ever wants to reapply for any programs, pt did not discuss income today, refuses CSW services.  RN CM gave resource "[employer]" in [place] and called but received voicemail and requested phone call back to inquire for pt as pt may be interested in eating lunch there daily, also wrote out phone number for pt.  Pt does not have extra money for gas to drive to town to participate in programs, RN CM reviewed [place] in the county as a Clinical biochemist with [employer] provides transportation for some patients.  Pt does not feel like it would help him to place his name of waiting list for meals on wheels as waiting list is 1-2 years now and did not pursue this option.  RN CM reviewed food pantries in [facility] and wrote out resources for pt, also [employer] and [employer]. RN CM and pt will be focusing on CHF and diabetes, RN CM reviewed plate method and counting carbohydrates, importance of daily weights and checking CBG daily, taking medications as prescribed, RN CM instructed pt to  check feet daily, reviewed action plan and calling MD/ 911. RN CM faxed initial home visit note and barrier letter to Dr. Legrand Rams, primary MD.  [facility] CM Care Plan Problem One        Most Recent Value   Care Plan Problem One  knowledge deficit related to CHF   Role Documenting the Problem One  Care Management [place] for Problem One  Active   [facility] Long Term  Goal (31-90 days)  pt will have no hospitalizations related to CHF within 90 days.   [facility] Long Term Goal Start Date  02/16/15   Interventions for Problem One Long Term Goal  RN CM reviewed Heart Failure booklet and CHF zones/ action plan   [facility] CM Short Term Goal #1 (0-30 days)  pt will verbalize CHF zones within 30 days.   [facility] CM Short Term Goal #1 Start Date  02/16/15   Interventions for Short Term Goal #1  RN CM ask pt to read over CHF zones and be mindful of how he feels each day, reminded pt of resources to call for weight gain, edema etc.   [facility] CM Short Term Goal #2 (0-30 days)  pt will weigh daily and record   [facility] CM Short Term Goal #2 Start Date  02/16/15   Interventions for Short Term Goal #2  RN CM instructed pt to weigh daily at same time and keep log, reviewed why it is important to weigh daily.    [facility] CM Care Plan Problem Two        Most Recent Value   Care Plan Problem Two  knowledge deficit related to diabetes   Role Documenting the Problem Two  Care Management Coordinator   Care Plan for Problem Two  Active   [facility] CM Short Term Goal #1 (0-30 days)  pt will check blood sugar daily and record   [facility] CM Short Term Goal #1 Start Date  02/16/15   Interventions for Short Term Goal #2   RN CM reviewed importance of checking CBG daily and recording and taking log to primary MD appointments      Plan: follow up with home visit 03/09/15 Review weight and CBG log Ask if pt going to nutrition site for lunch Follow up on any changes made by MD   Jacqlyn Larsen [facility], BSN [place]  Coordinator [phone]

## 2015-02-17 ENCOUNTER — Encounter: Payer: Self-pay | Admitting: Internal Medicine

## 2015-02-17 ENCOUNTER — Ambulatory Visit (INDEPENDENT_AMBULATORY_CARE_PROVIDER_SITE_OTHER): Payer: Medicare Other | Admitting: *Deleted

## 2015-02-17 ENCOUNTER — Ambulatory Visit (INDEPENDENT_AMBULATORY_CARE_PROVIDER_SITE_OTHER): Payer: Medicare Other | Admitting: Cardiology

## 2015-02-17 ENCOUNTER — Ambulatory Visit (HOSPITAL_COMMUNITY)
Admission: RE | Admit: 2015-02-17 | Discharge: 2015-02-17 | Disposition: A | Discharge status: Home / Self Care | Payer: Medicare Other | Source: Ambulatory Visit | Attending: Cardiology | Admitting: Cardiology

## 2015-02-17 ENCOUNTER — Encounter: Payer: Self-pay | Admitting: Cardiology

## 2015-02-17 VITALS — BP 130/78 | HR 82 | Ht 73.0 in | Wt 244.8 lb

## 2015-02-17 DIAGNOSIS — T82897A Other specified complication of cardiac prosthetic devices, implants and grafts, initial encounter: Secondary | ICD-10-CM | POA: Diagnosis not present

## 2015-02-17 DIAGNOSIS — I255 Ischemic cardiomyopathy: Secondary | ICD-10-CM | POA: Diagnosis not present

## 2015-02-17 DIAGNOSIS — Z87891 Personal history of nicotine dependence: Secondary | ICD-10-CM | POA: Diagnosis not present

## 2015-02-17 DIAGNOSIS — Z95810 Presence of automatic (implantable) cardiac defibrillator: Secondary | ICD-10-CM | POA: Diagnosis not present

## 2015-02-17 DIAGNOSIS — T82118A Breakdown (mechanical) of other cardiac electronic device, initial encounter: Secondary | ICD-10-CM

## 2015-02-17 DIAGNOSIS — T82198A Other mechanical complication of other cardiac electronic device, initial encounter: Secondary | ICD-10-CM | POA: Diagnosis not present

## 2015-02-17 DIAGNOSIS — I2510 Atherosclerotic heart disease of native coronary artery without angina pectoris: Secondary | ICD-10-CM | POA: Diagnosis not present

## 2015-02-17 DIAGNOSIS — I5043 Acute on chronic combined systolic (congestive) and diastolic (congestive) heart failure: Secondary | ICD-10-CM

## 2015-02-17 LAB — CUP PACEART INCLINIC DEVICE CHECK
Battery Remaining Longevity: 93.6 mo
Brady Statistic RA Percent Paced: 8.3 %
Brady Statistic RV Percent Paced: 84 %
Date Time Interrogation Session: 20160928162813
HighPow Impedance: 64 Ohm
Lead Channel Impedance Value: 537.5 Ohm
Lead Channel Impedance Value: 700 Ohm
Lead Channel Pacing Threshold Amplitude: 0.75 V
Lead Channel Pacing Threshold Amplitude: 0.75 V
Lead Channel Pacing Threshold Amplitude: 0.75 V
Lead Channel Pacing Threshold Pulse Width: 0.5 ms
Lead Channel Pacing Threshold Pulse Width: 0.5 ms
Lead Channel Pacing Threshold Pulse Width: 0.5 ms
Lead Channel Sensing Intrinsic Amplitude: 2.8 mV
Lead Channel Setting Pacing Amplitude: 2 V
Lead Channel Setting Pacing Pulse Width: 0.5 ms
MDC IDC MSMT LEADCHNL RA IMPEDANCE VALUE: 450 Ohm
MDC IDC MSMT LEADCHNL RV PACING THRESHOLD AMPLITUDE: 0.75 V
MDC IDC MSMT LEADCHNL RV PACING THRESHOLD PULSEWIDTH: 0.5 ms
MDC IDC MSMT LEADCHNL RV SENSING INTR AMPL: 11.6 mV
MDC IDC PG SERIAL: 7288352
MDC IDC SET LEADCHNL RA PACING AMPLITUDE: 1.5 V
MDC IDC SET LEADCHNL RV SENSING SENSITIVITY: 0.5 mV
Zone Setting Detection Interval: 300 ms
Zone Setting Detection Interval: 350 ms

## 2015-02-17 NOTE — Progress Notes (Signed)
CRT-D device check in office for pectoral stimulation. Atrial and RV thresholds and sensing consistent with previous device measurements. Lead impedance trends stable over time. CXR ordered today shows LV lead wound around device and electrodes in pocket at left chest. LV lead turned off. Dr. Rayann Heman to room to confirm CXR and programming changes. Scheduled to see Dr. Lovena Le in [place] 02/19/15 at 10:45am to discuss plan.  Pt started to feel weak and diaphoretic- he mentioned that his blood sugar drops occasionally. Capillary blood glucose 53. Coke and crackers given to patient. Pt consumed snack and felt better after approximately 15 minutes. Pt instructed to stop to eat a meal on his way home- he is agreeable.

## 2015-02-17 NOTE — Patient Instructions (Signed)
Your physician recommends that you schedule a follow-up appointment in: 2 [place]  Your physician has recommended you make the following change in your medication:  INCREASE YOUR LASIX TO 80 MG TWO TIMES DAILY FOR THE NEXT 3 DAYS  Thanks for choosing [place]!!!

## 2015-02-17 NOTE — Progress Notes (Signed)
Cardiology Office Note  Date: 02/17/2015   ID: [name], DOB Dec 05, 1945, MRN [id number]  PCP: Rosita Fire, MD  Primary Cardiologist: Rozann Lesches, MD   Chief Complaint  Patient presents with  . Coronary Artery Disease  . Cardiomyopathy    History of Present Illness: [name] is a medically complex 69 y.o. male last seen in July. Recent interval follow-up noted with Dr. Lovena Le including device interrogation. He had only 4% burden of PVCs as of September, there are some recent questions about whether he might be having diaphragmatic stimulation based on review of the chart. He is to have follow-up later this week with Dr. Lovena Le.  I reviewed his symptoms, he has been more fatigued and has noted more palpitations/PVCs in the last several weeks. Originally this had gotten better after his device was placed. This past Monday morning he began to experience a forceful jumping sensation in his left side. Chart indicates that he did call in a device interrogation to [facility] which raised concern about possible diaphragmatic stimulation. Today he states that the jumping sensation is now up in his left chest and arm, and this is in fact visible just by walking into the room and seeing his arm move in rhythm with his heartbeat. Muscle stimulation left chest is palpable as well.  Chest x-ray from June showed cardiomegaly with ICD in place, right-sided leads in the atrium and ventricle as well as LV lead visible and in position.  Weight is up 10 pounds from July. He does not report any change in his medications or diuretic use. He states that he has been trying to lose weight by limiting his diet. He does not have any leg swelling. Abdomen is protuberant.  Past Medical History  Diagnosis Date  . Ischemic cardiomyopathy     LVEF 45-50% 11/2011  . Coronary atherosclerosis of native coronary artery     Multivessel status post CABG  . Angioedema     Secondary to ACE  inhibitor  . Bell palsy   . Type 2 diabetes mellitus   . AAA (abdominal aortic aneurysm)     Followed by Dr. Donnetta Hutching  . Essential hypertension, benign   . PAD (peripheral artery disease)     Followed by Dr. Donnetta Hutching  . Carotid artery disease   . Atrial fibrillation   . Hernia of abdominal wall   . AICD (automatic cardioverter/defibrillator) present   . Nephrolithiasis   . Hypercholesteremia   . Myocardial infarction   . Migraines   . Arthritis   . Gout   . Sleep apnea     Past Surgical History  Procedure Laterality Date  . Dupuytren contracture release Left 2009  . Cataract extraction w/phaco  03/23/2011    Procedure: CATARACT EXTRACTION PHACO AND INTRAOCULAR LENS PLACEMENT (IOC);  Surgeon: Tonny Branch;  [facility]: [facility];  Service: Ophthalmology;  Laterality: Left;  CDE: 15.99  . Cataract extraction w/phaco  04/17/2011    Procedure: CATARACT EXTRACTION PHACO AND INTRAOCULAR LENS PLACEMENT (IOC);  Surgeon: Tonny Branch;  Location: [facility];  Service: Ophthalmology;  Laterality: Right;  CDE: 23.45  . Foot surgery Left 1963    "gangrene"  . Eye surgery    . Bypass graft popliteal to popliteal Left 06/08/2014    Procedure: BYPASS GRAFT FEMORAL ARTERY TO ABOVE KNEE POPLITEAL;  Surgeon: Rosetta Posner, MD;  Location: [place];  Service: Vascular;  Laterality: Left;  . Abdominal aortic aneurysm repair  November 19, 2013    [facility]  Hospital :  Dr. Sammuel Hines  . Abdominal aortic aneurysm repair  09-07-2014    [facility]  . Bi-ventricular implantable cardioverter defibrillator  (crt-d)  11/05/2014  . Extracorporeal shock wave lithotripsy  X 1  . Cardiac catheterization N/A 10/02/2014    Procedure: Left Heart Cath and Cors/Grafts Angiography;  Surgeon: Belva Crome, MD;  Location: [employer] [facility];  Service: Cardiovascular;  Laterality: N/A;  . Cardiac catheterization  "several"  . Coronary artery bypass graft  2001    LIMA to LAD, SVG to diagonal and ramus, SVG to OM, SVG to AM  . Ep implantable device  N/A 11/05/2014    Procedure: BiV ICD Insertion CRT-D;  Surgeon: Evans Lance, MD;  Location: [place] [facility];  Service: Cardiovascular;  Laterality: N/A;    Current Outpatient Prescriptions  Medication Sig Dispense Refill  . acetaminophen (TYLENOL) 500 MG tablet Take 1,000 mg by mouth 2 (two) times daily as needed for headache (pain).    Marland Kitchen allopurinol (ZYLOPRIM) 100 MG tablet Take 100 mg by mouth daily with supper.     Marland Kitchen amLODipine (NORVASC) 2.5 MG tablet Take 1 tablet (2.5 mg total) by mouth daily. 90 tablet 3  . aspirin EC 81 MG tablet Take 81 mg by mouth daily.    . carvedilol (COREG) 25 MG tablet Take 0.5 tablets (12.5 mg total) by mouth 2 (two) times daily with a meal. 30 tablet 6  . furosemide (LASIX) 40 MG tablet Take 40 mg twice a day, may take extra 40 mg daily for weight gain over 3 lbs overnite 180 tablet 3  . gabapentin (NEURONTIN) 300 MG capsule Take 300 mg by mouth 2 (two) times daily.    Marland Kitchen glipiZIDE (GLUCOTROL XL) 10 MG 24 hr tablet Take 5 mg by mouth 2 (two) times daily.     . isosorbide mononitrate (IMDUR) 60 MG 24 hr tablet Take 2 tablets (120 mg total) by mouth every morning. & 30 mg in the evening (Patient taking differently: Take 30-120 mg by mouth 2 (two) times daily. Take 2 tablets (120 mg) every morning and 1/2 tablet (30 mg) with supper) 90 tablet 3  . linagliptin (TRADJENTA) 5 MG TABS tablet Take 5 mg by mouth daily.      . metFORMIN (GLUCOPHAGE) 500 MG tablet Take by mouth 2 (two) times daily with a meal.    . NITROSTAT 0.4 MG SL tablet PLACE 1 TABLET UNDER THE TONGUE EVERY 5 MINUTES UP TO 3 DOSES AS NEEDED FOR CHEST PAIN. 25 tablet 3  . simvastatin (ZOCOR) 20 MG tablet Take 1 tablet (20 mg total) by mouth at bedtime. 90 tablet 3   No current facility-administered medications for this visit.    Allergies:  Ace inhibitors; Codeine; Lisinopril; and Hydromorphone   Social History: The patient  reports that he quit smoking about 7 years ago. His smoking use  included Cigarettes. He started smoking about 35 years ago. He has a 25 pack-year smoking history. He has never used smokeless tobacco. He reports that he does not drink alcohol or use illicit drugs.   ROS:  Please see the history of present illness. Otherwise, complete review of systems is positive for fatigue in addition to above.  All other systems are reviewed and negative.   Physical Exam: VS:  BP 130/78 mmHg  Pulse 82  Ht 6\' 1"  (1.854 m)  Wt 244 lb 12.8 oz (111.041 kg)  BMI 32.30 kg/m2  SpO2 93%, BMI Body mass index  is 32.3 kg/(m^2).  Wt Readings from Last 3 Encounters:  02/17/15 244 lb 12.8 oz (111.041 kg)  02/16/15 235 lb (106.595 kg)  02/04/15 242 lb 3.2 oz (109.861 kg)     General: Obese male, appears comfortable at rest, left arm/chest with jumping and muscle twitching in rhythm with heart pacing. HEENT: Conjunctiva and lids normal, oropharynx clear. Neck: Supple, elevated JVP, no carotid bruits, no thyromegaly. Thorax: Well-healed device pocket site, no erythema or tenderness. Lungs: Decreased breath sounds, nonlabored breathing at rest. Cardiac: Indistinct PMI, regular rate and rhythm, no S3, no pericardial rub. Abdomen: Protuberant, nontender, bowel sounds present. Extremities: Trace edema, distal pulses 2+. Skin: Warm and dry. Musculoskeletal: No kyphosis. Neuropsychiatric: Alert and oriented x3, affect grossly appropriate.   ECG: ECG is not ordered today. He had a recent tracing on September 15 which was reviewed showing sinus rhythm with ventricular pacing and frequent PVCs.  Recent Labwork: 09/30/2014: ALT 14*; AST 18 11/05/2014: BUN 15; Creatinine, Ser 1.56*; Hemoglobin 11.5*; Platelets 351; Potassium 3.8; Sodium 140     Component Value Date/Time   CHOL 118 10/16/2014 0715   TRIG 100 10/16/2014 0715   HDL 33* 10/16/2014 0715   CHOLHDL 3.6 10/16/2014 0715   VLDL 20 10/16/2014 0715   LDLCALC 65 10/16/2014 0715    Other Studies Reviewed Today:  Cardiac  catheterization 10/02/2014:  Bypass graft failure with total occlusion of the saphenous vein graft to the acute marginal of the right coronary, total occlusion of the sequential saphenous vein graft to the diagonal and ramus intermedius, and total occlusion of the saphenous graft to the distal circumflex/obtuse marginal.  Patent LIMA to the LAD.  Total occlusion of the native LAD, the mid right coronary, second obtuse marginal, the first diagonal, and the ramus intermedius.  The distal right coronary, the obtuse marginal, ramus intermedius are collateralized from the native circulation via the LIMA to LAD.  Severe left ventricular systolic dysfunction with an inferobasal aneurysm, and an ejection fraction of 20-25%.  ASSESSMENT AND PLAN:  1. Thoracic muscle stimulation from pacemaker, evident on examination. Chest x-ray done today shows backward migration of the LV lead up toward the device can. I discussed situation with Dr. Lovena Le, and the patient is going to be worked in to the device clinic today in the [employer] for reprogramming, likely deactivation of the LV lead with further plans to be determined by Dr. Lovena Le. RV leads look to be in similar position to June.  2. Ischemic cardiomyopathy with LVEF down to 20-25% at cardiac catheterization in May. He has subsequently undergone placement of CRT-D by Dr. Lovena Le, and initially had improvement in symptoms including reduction in frequency of PVCs. Symptoms have been worsening over the last few weeks and his weight is up. We had planned on doing an echocardiogram around this time, but would hold off until the device parameters are further stabilized.  3. Acute on chronic systolic heart failure with weight gain. Increase Lasix to 80 mg twice daily for the next 3 days. May need to keep diuretics at higher dose, although if his device parameters and PVCs can be stabilized, this may also help.  4. Multivessel CAD status post CABG with  significant graft disease as outlined above. Plan is to continue medical therapy.  Current medicines were reviewed at length with the patient today.   Orders Placed This Encounter  Procedures  . DG Chest 2 View    Disposition: FU with me in the next few weeks. Patient to  present to the device clinic in the [facility] office this afternoon for device interrogation and management.  Signed, Satira Sark, MD, [facility] 02/17/2015 3:22 PM    [facility] at [facility] [address], [place], [place] [address] Phone: [phone]; Fax: [phone]

## 2015-02-19 ENCOUNTER — Other Ambulatory Visit: Payer: Self-pay | Admitting: Pharmacist

## 2015-02-19 ENCOUNTER — Encounter: Payer: Self-pay | Admitting: Internal Medicine

## 2015-02-19 ENCOUNTER — Other Ambulatory Visit (HOSPITAL_COMMUNITY)
Admission: RE | Admit: 2015-02-19 | Discharge: 2015-02-19 | Disposition: A | Discharge status: Home / Self Care | Payer: Medicare Other | Source: Ambulatory Visit | Attending: Internal Medicine | Admitting: Internal Medicine

## 2015-02-19 ENCOUNTER — Ambulatory Visit (INDEPENDENT_AMBULATORY_CARE_PROVIDER_SITE_OTHER): Payer: Medicare Other | Admitting: Internal Medicine

## 2015-02-19 ENCOUNTER — Encounter: Payer: Self-pay | Admitting: *Deleted

## 2015-02-19 VITALS — BP 128/70 | HR 77 | Ht 73.0 in | Wt 243.6 lb

## 2015-02-19 DIAGNOSIS — T82198A Other mechanical complication of other cardiac electronic device, initial encounter: Secondary | ICD-10-CM | POA: Diagnosis not present

## 2015-02-19 DIAGNOSIS — I2510 Atherosclerotic heart disease of native coronary artery without angina pectoris: Secondary | ICD-10-CM | POA: Diagnosis not present

## 2015-02-19 DIAGNOSIS — Z951 Presence of aortocoronary bypass graft: Secondary | ICD-10-CM | POA: Diagnosis not present

## 2015-02-19 DIAGNOSIS — I252 Old myocardial infarction: Secondary | ICD-10-CM | POA: Insufficient documentation

## 2015-02-19 DIAGNOSIS — Z01818 Encounter for other preprocedural examination: Secondary | ICD-10-CM | POA: Diagnosis not present

## 2015-02-19 DIAGNOSIS — Z833 Family history of diabetes mellitus: Secondary | ICD-10-CM | POA: Diagnosis not present

## 2015-02-19 DIAGNOSIS — Z95810 Presence of automatic (implantable) cardiac defibrillator: Secondary | ICD-10-CM | POA: Insufficient documentation

## 2015-02-19 DIAGNOSIS — I714 Abdominal aortic aneurysm, without rupture: Secondary | ICD-10-CM | POA: Insufficient documentation

## 2015-02-19 DIAGNOSIS — E780 Pure hypercholesterolemia: Secondary | ICD-10-CM | POA: Diagnosis not present

## 2015-02-19 DIAGNOSIS — Z8249 Family history of ischemic heart disease and other diseases of the circulatory system: Secondary | ICD-10-CM | POA: Diagnosis not present

## 2015-02-19 DIAGNOSIS — E119 Type 2 diabetes mellitus without complications: Secondary | ICD-10-CM | POA: Insufficient documentation

## 2015-02-19 DIAGNOSIS — I255 Ischemic cardiomyopathy: Secondary | ICD-10-CM

## 2015-02-19 DIAGNOSIS — T82118A Breakdown (mechanical) of other cardiac electronic device, initial encounter: Secondary | ICD-10-CM

## 2015-02-19 DIAGNOSIS — G4730 Sleep apnea, unspecified: Secondary | ICD-10-CM | POA: Insufficient documentation

## 2015-02-19 DIAGNOSIS — I5022 Chronic systolic (congestive) heart failure: Secondary | ICD-10-CM | POA: Diagnosis not present

## 2015-02-19 DIAGNOSIS — Z79899 Other long term (current) drug therapy: Secondary | ICD-10-CM | POA: Diagnosis not present

## 2015-02-19 LAB — CBC WITH DIFFERENTIAL/PLATELET
Basophils Absolute: 0.1 10*3/uL (ref 0.0–0.1)
Basophils Relative: 1 %
Eosinophils Absolute: 0.7 10*3/uL (ref 0.0–0.7)
Eosinophils Relative: 6 %
HCT: 39.7 % (ref 39.0–52.0)
Hemoglobin: 12.9 g/dL — ABNORMAL LOW (ref 13.0–17.0)
LYMPHS ABS: 2.9 10*3/uL (ref 0.7–4.0)
LYMPHS PCT: 26 %
MCH: 30.4 pg (ref 26.0–34.0)
MCHC: 32.5 g/dL (ref 30.0–36.0)
MCV: 93.6 fL (ref 78.0–100.0)
MONO ABS: 1.1 10*3/uL — AB (ref 0.1–1.0)
Monocytes Relative: 10 %
Neutro Abs: 6.3 10*3/uL (ref 1.7–7.7)
Neutrophils Relative %: 57 %
Platelets: 327 10*3/uL (ref 150–400)
RBC: 4.24 MIL/uL (ref 4.22–5.81)
RDW: 16 % — ABNORMAL HIGH (ref 11.5–15.5)
WBC: 11.2 10*3/uL — AB (ref 4.0–10.5)

## 2015-02-19 LAB — BASIC METABOLIC PANEL
Anion gap: 8 (ref 5–15)
BUN: 21 mg/dL — ABNORMAL HIGH (ref 6–20)
CHLORIDE: 101 mmol/L (ref 101–111)
CO2: 31 mmol/L (ref 22–32)
CREATININE: 1.84 mg/dL — AB (ref 0.61–1.24)
Calcium: 9.2 mg/dL (ref 8.9–10.3)
GFR calc non Af Amer: 37 mL/min — ABNORMAL LOW (ref >60)
GFR, EST AFRICAN AMERICAN: 42 mL/min — AB (ref >60)
Glucose, Bld: 82 mg/dL (ref 65–99)
Potassium: 3.5 mmol/L (ref 3.5–5.1)
Sodium: 140 mmol/L (ref 135–145)

## 2015-02-19 LAB — APTT: aPTT: 32 seconds (ref 24–37)

## 2015-02-19 LAB — PROTIME-INR
INR: 0.97 (ref 0.00–1.49)
Prothrombin Time: 13.1 seconds (ref 11.6–15.2)

## 2015-02-19 NOTE — Progress Notes (Signed)
HPI [name] returns today for ongoing ICD followup. He is a pleasant 69 yo man with chronic systolic heart failure, CAD, s/p CABG, LBBB, who underwent BiV ICD implant approx. 3.5 months ago. He was seen by me several weeks ago and was stable. He developed diaphragmatic stimulation 5 days ago. 3 days ago he developed chest wall stimulation. He was found on CXR to have retraction of his LV lead back to the subclavian vein. He denies any trouble or unusual movement of his left arm. Allergies  Allergen Reactions  . Ace Inhibitors Anaphylaxis  . Codeine Other (See Comments)     delirium  . Lisinopril Anaphylaxis and Swelling  . Hydromorphone Nausea And Vomiting     Current Outpatient Prescriptions  Medication Sig Dispense Refill  . acetaminophen (TYLENOL) 500 MG tablet Take 1,000 mg by mouth 2 (two) times daily as needed for headache (pain).    Marland Kitchen allopurinol (ZYLOPRIM) 100 MG tablet Take 100 mg by mouth daily with supper.     Marland Kitchen amLODipine (NORVASC) 2.5 MG tablet Take 1 tablet (2.5 mg total) by mouth daily. 90 tablet 3  . aspirin EC 81 MG tablet Take 81 mg by mouth daily.    . carvedilol (COREG) 25 MG tablet Take 0.5 tablets (12.5 mg total) by mouth 2 (two) times daily with a meal. 30 tablet 6  . furosemide (LASIX) 40 MG tablet Take 40 mg twice a day, may take extra 40 mg daily for weight gain over 3 lbs overnite 180 tablet 3  . gabapentin (NEURONTIN) 300 MG capsule Take 300 mg by mouth 2 (two) times daily.    Marland Kitchen glipiZIDE (GLUCOTROL XL) 10 MG 24 hr tablet Take 5 mg by mouth 2 (two) times daily.     . isosorbide mononitrate (IMDUR) 60 MG 24 hr tablet Take 2 tablets (120 mg total) by mouth every morning. & 30 mg in the evening (Patient taking differently: Take 30-120 mg by mouth 2 (two) times daily. Take 2 tablets (120 mg) every morning and 1/2 tablet (30 mg) with supper) 90 tablet 3  . linagliptin (TRADJENTA) 5 MG TABS tablet Take 5 mg by mouth daily.      . metFORMIN (GLUCOPHAGE)  500 MG tablet Take by mouth 2 (two) times daily with a meal.    . NITROSTAT 0.4 MG SL tablet PLACE 1 TABLET UNDER THE TONGUE EVERY 5 MINUTES UP TO 3 DOSES AS NEEDED FOR CHEST PAIN. 25 tablet 3  . simvastatin (ZOCOR) 20 MG tablet Take 1 tablet (20 mg total) by mouth at bedtime. 90 tablet 3   No current facility-administered medications for this visit.     Past Medical History  Diagnosis Date  . Ischemic cardiomyopathy     LVEF 45-50% 11/2011  . Coronary atherosclerosis of native coronary artery     Multivessel status post CABG  . Angioedema     Secondary to ACE inhibitor  . Bell palsy   . Type 2 diabetes mellitus   . AAA (abdominal aortic aneurysm)     Followed by Dr. Donnetta Hutching  . Essential hypertension, benign   . PAD (peripheral artery disease)     Followed by Dr. Donnetta Hutching  . Carotid artery disease   . Atrial fibrillation   . Hernia of abdominal wall   . AICD (automatic cardioverter/defibrillator) present   . Nephrolithiasis   . Hypercholesteremia   . Myocardial infarction   . Migraines   . Arthritis   . Gout   .  Sleep apnea     ROS:   All systems reviewed and negative except as noted in the HPI.   Past Surgical History  Procedure Laterality Date  . Dupuytren contracture release Left 2009  . Cataract extraction w/phaco  03/23/2011    Procedure: CATARACT EXTRACTION PHACO AND INTRAOCULAR LENS PLACEMENT (IOC);  Surgeon: Tonny Branch;  Location: [facility];  Service: Ophthalmology;  Laterality: Left;  CDE: 15.99  . Cataract extraction w/phaco  04/17/2011    Procedure: CATARACT EXTRACTION PHACO AND INTRAOCULAR LENS PLACEMENT (IOC);  Surgeon: Tonny Branch;  Location: [facility];  Service: Ophthalmology;  Laterality: Right;  CDE: 23.45  . Foot surgery Left 1963    "gangrene"  . Eye surgery    . Bypass graft popliteal to popliteal Left 06/08/2014    Procedure: BYPASS GRAFT FEMORAL ARTERY TO ABOVE KNEE POPLITEAL;  Surgeon: Rosetta Posner, MD;  Location: [facility];  Service: Vascular;  Laterality:  Left;  . Abdominal aortic aneurysm repair  November 19, 2013    [facility] :  Dr. Sammuel Hines  . Abdominal aortic aneurysm repair  09-07-2014    [facility]  . Bi-ventricular implantable cardioverter defibrillator  (crt-d)  11/05/2014  . Extracorporeal shock wave lithotripsy  X 1  . Cardiac catheterization N/A 10/02/2014    Procedure: Left Heart Cath and Cors/Grafts Angiography;  Surgeon: Belva Crome, MD;  Location: [place] [facility];  Service: Cardiovascular;  Laterality: N/A;  . Cardiac catheterization  "several"  . Coronary artery bypass graft  2001    LIMA to LAD, SVG to diagonal and ramus, SVG to OM, SVG to AM  . Ep implantable device N/A 11/05/2014    Procedure: BiV ICD Insertion CRT-D;  Surgeon: Evans Lance, MD;  Location: [place] [facility];  Service: Cardiovascular;  Laterality: N/A;     Family History  Problem Relation Age of Onset  . Diabetes Mother     Type  I   . Varicose Veins Mother   . Heart disease Father 42    AAA  . AAA (abdominal aortic aneurysm) Father   . Hyperlipidemia Father   . Hypertension Father      Social History   Social History  . Marital Status: Divorced    Spouse Name: N/A  . Number of Children: N/A  . Years of Education: N/A   Occupational History  . Not on file.   Social History Main Topics  . Smoking status: Former Smoker -- 1.00 packs/day for 25 years    Types: Cigarettes    Start date: 02/28/1979    Quit date: 05/23/2007  . Smokeless tobacco: Never Used  . Alcohol Use: No  . Drug Use: No  . Sexual Activity: No   Other Topics Concern  . Not on file   Social History Narrative     BP 128/70 mmHg  Pulse 77  Ht 6\' 1"  (1.854 m)  Wt 243 lb 9.6 oz (110.496 kg)  BMI 32.15 kg/m2  SpO2 95%  Physical Exam:  Well appearing 69 yo man, NAD HEENT: Unremarkable Neck:  7 cm JVD, no thyromegally Back:  No CVA tenderness Lungs:  Clear with no wheezes HEART:  Regular rate rhythm, no murmurs, no rubs, no clicks Abd:  soft,  positive bowel sounds, no organomegally, no rebound, no guarding Ext:   no edema, no cyanosis, no clubbing Skin:  No rashes no nodules Neuro:  CN II through XII intact, motor grossly intact      Assess/Plan:

## 2015-02-19 NOTE — Patient Instructions (Addendum)
Your physician recommends that you schedule a follow-up appointment in: 3 Months with Dr. Lovena Le  Your physician recommends that you continue on your current medications as directed. Please refer to the Current Medication list given to you today. You are Scheduled for a Lead Revision on 02/24/15 3:00 pm please arrive at [facility] at 1:00 pm on that day.    Thank you for choosing [facility]!

## 2015-02-19 NOTE — Patient Outreach (Signed)
[facility]) Care Management  [place]   02/19/2015  [name] 13-Aug-1945 [id number]  Subjective: [name] is a 69 y.o. male who was referred to [place] for medication review and education.   I completed the medication review but patient would still like to discuss his medications over the phone and learn more about how they work.   Patient reported that he has been having episodes of hypoglycemia after restarting his diabetes medications (metformin, Tradjenta, glipizide). He reports that he is able to treat it by eating something with carbs in it.   Patient is also concerned about medications causing drowsiness. He wants to know specifically about amlodipine and carvedilol as both of those medications have "may cause dizziness or drowsiness" on them.  Objective:   Current Medications: Current Outpatient Prescriptions  Medication Sig Dispense Refill  . acetaminophen (TYLENOL) 500 MG tablet Take 1,000 mg by mouth 2 (two) times daily as needed for headache (pain).    Marland Kitchen allopurinol (ZYLOPRIM) 100 MG tablet Take 100 mg by mouth daily with supper.     Marland Kitchen amLODipine (NORVASC) 2.5 MG tablet Take 1 tablet (2.5 mg total) by mouth daily. 90 tablet 3  . aspirin EC 81 MG tablet Take 81 mg by mouth daily.    . carvedilol (COREG) 25 MG tablet Take 0.5 tablets (12.5 mg total) by mouth 2 (two) times daily with a meal. 30 tablet 6  . furosemide (LASIX) 40 MG tablet Take 40 mg twice a day, may take extra 40 mg daily for weight gain over 3 lbs overnite 180 tablet 3  . gabapentin (NEURONTIN) 300 MG capsule Take 300 mg by mouth 2 (two) times daily.    Marland Kitchen glipiZIDE (GLUCOTROL XL) 10 MG 24 hr tablet Take 5 mg by mouth 2 (two) times daily.     . isosorbide mononitrate (IMDUR) 60 MG 24 hr tablet Take 2 tablets (120 mg total) by mouth every morning. & 30 mg in the evening (Patient taking differently: Take 30-120 mg by mouth 2 (two) times daily. Take 2 tablets  (120 mg) every morning and 1/2 tablet (30 mg) with supper) 90 tablet 3  . linagliptin (TRADJENTA) 5 MG TABS tablet Take 5 mg by mouth daily.      . metFORMIN (GLUCOPHAGE) 500 MG tablet Take by mouth 2 (two) times daily with a meal.    . NITROSTAT 0.4 MG SL tablet PLACE 1 TABLET UNDER THE TONGUE EVERY 5 MINUTES UP TO 3 DOSES AS NEEDED FOR CHEST PAIN. 25 tablet 3  . simvastatin (ZOCOR) 20 MG tablet Take 1 tablet (20 mg total) by mouth at bedtime. 90 tablet 3   No current facility-administered medications for this visit.    Functional Status: In your present state of health, do you have any difficulty performing the following activities: 02/16/2015 02/03/2015  Hearing? N N  Vision? N N  Difficulty concentrating or making decisions? N N  Walking or climbing stairs? Y Y  Dressing or bathing? N N  Doing errands, shopping? N N  Preparing Food and eating ? N -  Using the Toilet? N -  In the past six months, have you accidently leaked urine? N -  Do you have problems with loss of bowel control? N -  Managing your Medications? N -  Managing your Finances? N -  Housekeeping or managing your Housekeeping? N -    Fall/Depression Screening: PHQ 2/9 Scores 02/16/2015  PHQ - 2 Score 0  Assessment: 1. Medication education: patient in need of medication education and interested in learning more about how his medications work. I suspect that the glipizide is the main culprit in causing hypoglycemia. I also think that the fatigue may be due to the stress and faulty pacemaker, as well as low blood glucoses.    Plan: 1. Medication education: Reviewed how all of his medications work (mechanism of action) and discussed why his blood glucose may be dropping too low. Encouraged patient to reach out to Dr. Legrand Rams so that maybe one of the medications could be stopped. Also reviewed his other medications and discussed which ones may cause drowsiness. Patient is to get his pacemaker leads repaired next week.  Will follow up with him in two weeks to see how he is feeling. Will send a fax to Dr. Legrand Rams to alert him to the episodes of hypoglycemia.      Nicoletta Ba, PharmD, [facility] [phone]

## 2015-02-19 NOTE — Assessment & Plan Note (Signed)
I discussed the treatment options and likely mechanism. I suspect his lead moved slightly resulting in diaphragmatic stimulation followed by ongoing retraction caused by 2 days of constant diaphragmatic stimulation acting as a fulcrum on the lead. Interestingly the atrial and rv leads have not moved although they are actively fixed. We will schedule the patient for LV lead revision.

## 2015-02-24 ENCOUNTER — Encounter (HOSPITAL_COMMUNITY)
Admission: RE | Disposition: A | Discharge status: Home / Self Care | Payer: Self-pay | Source: Ambulatory Visit | Attending: Internal Medicine

## 2015-02-24 ENCOUNTER — Ambulatory Visit (HOSPITAL_COMMUNITY)
Admission: RE | Admit: 2015-02-24 | Discharge: 2015-02-25 | Disposition: A | Discharge status: Home / Self Care | Payer: Medicare Other | Source: Ambulatory Visit | Attending: Internal Medicine | Admitting: Internal Medicine

## 2015-02-24 DIAGNOSIS — Z4502 Encounter for adjustment and management of automatic implantable cardiac defibrillator: Secondary | ICD-10-CM | POA: Insufficient documentation

## 2015-02-24 DIAGNOSIS — I2510 Atherosclerotic heart disease of native coronary artery without angina pectoris: Secondary | ICD-10-CM | POA: Diagnosis not present

## 2015-02-24 DIAGNOSIS — Z951 Presence of aortocoronary bypass graft: Secondary | ICD-10-CM | POA: Insufficient documentation

## 2015-02-24 DIAGNOSIS — I252 Old myocardial infarction: Secondary | ICD-10-CM | POA: Diagnosis not present

## 2015-02-24 DIAGNOSIS — T82118A Breakdown (mechanical) of other cardiac electronic device, initial encounter: Secondary | ICD-10-CM | POA: Diagnosis present

## 2015-02-24 DIAGNOSIS — Z7982 Long term (current) use of aspirin: Secondary | ICD-10-CM | POA: Insufficient documentation

## 2015-02-24 DIAGNOSIS — E119 Type 2 diabetes mellitus without complications: Secondary | ICD-10-CM | POA: Insufficient documentation

## 2015-02-24 DIAGNOSIS — Z87891 Personal history of nicotine dependence: Secondary | ICD-10-CM | POA: Diagnosis not present

## 2015-02-24 DIAGNOSIS — I5022 Chronic systolic (congestive) heart failure: Secondary | ICD-10-CM | POA: Insufficient documentation

## 2015-02-24 DIAGNOSIS — I447 Left bundle-branch block, unspecified: Secondary | ICD-10-CM | POA: Insufficient documentation

## 2015-02-24 DIAGNOSIS — Z45018 Encounter for adjustment and management of other part of cardiac pacemaker: Secondary | ICD-10-CM | POA: Diagnosis present

## 2015-02-24 DIAGNOSIS — I10 Essential (primary) hypertension: Secondary | ICD-10-CM | POA: Insufficient documentation

## 2015-02-24 DIAGNOSIS — T82110A Breakdown (mechanical) of cardiac electrode, initial encounter: Secondary | ICD-10-CM | POA: Diagnosis not present

## 2015-02-24 DIAGNOSIS — I255 Ischemic cardiomyopathy: Secondary | ICD-10-CM | POA: Diagnosis not present

## 2015-02-24 HISTORY — PX: ICD LEAD REMOVAL: SHX5855

## 2015-02-24 HISTORY — PX: EP IMPLANTABLE DEVICE: SHX172B

## 2015-02-24 LAB — GLUCOSE, CAPILLARY
Glucose-Capillary: 127 mg/dL — ABNORMAL HIGH (ref 65–99)
Glucose-Capillary: 116 mg/dL — ABNORMAL HIGH (ref 65–99)
Glucose-Capillary: 167 mg/dL — ABNORMAL HIGH (ref 65–99)

## 2015-02-24 LAB — SURGICAL PCR SCREEN
MRSA, PCR: NEGATIVE
Staphylococcus aureus: NEGATIVE

## 2015-02-24 SURGERY — LEAD REVISION/REPAIR

## 2015-02-24 MED ORDER — CHLORHEXIDINE GLUCONATE 4 % EX LIQD
60.0000 mL | Freq: Once | CUTANEOUS | Status: DC
  Filled 2015-02-24: qty 60

## 2015-02-24 MED ORDER — ISOSORBIDE MONONITRATE ER 60 MG PO TB24
120.0000 mg | ORAL_TABLET | Freq: Every morning | ORAL | Status: DC
  Administered 2015-02-25: 09:00:00 120 mg via ORAL
  Filled 2015-02-24: qty 2

## 2015-02-24 MED ORDER — SODIUM CHLORIDE 0.9 % IR SOLN
80.0000 mg | Status: AC
  Administered 2015-02-24: 80 mg
  Filled 2015-02-24: qty 2

## 2015-02-24 MED ORDER — CEFAZOLIN SODIUM-DEXTROSE 2-3 GM-% IV SOLR
INTRAVENOUS | Status: DC | PRN
  Administered 2015-02-24: 2 g via INTRAVENOUS

## 2015-02-24 MED ORDER — SIMVASTATIN 20 MG PO TABS
20.0000 mg | ORAL_TABLET | Freq: Every day | ORAL | Status: DC
  Administered 2015-02-24: 20 mg via ORAL
  Filled 2015-02-24: qty 1

## 2015-02-24 MED ORDER — CEFAZOLIN SODIUM-DEXTROSE 2-3 GM-% IV SOLR: 2.0000 g | INTRAVENOUS | Status: DC

## 2015-02-24 MED ORDER — FENTANYL CITRATE (PF) 100 MCG/2ML IJ SOLN
INTRAMUSCULAR | Status: AC
  Filled 2015-02-24: qty 4

## 2015-02-24 MED ORDER — ONDANSETRON HCL 4 MG/2ML IJ SOLN: 4.0000 mg | Freq: Four times a day (QID) | INTRAMUSCULAR | Status: DC | PRN

## 2015-02-24 MED ORDER — IOHEXOL 350 MG/ML SOLN
INTRAVENOUS | Status: DC | PRN
  Administered 2015-02-24: 10 mL via INTRACARDIAC

## 2015-02-24 MED ORDER — CEFAZOLIN SODIUM-DEXTROSE 2-3 GM-% IV SOLR
INTRAVENOUS | Status: AC
  Filled 2015-02-24: qty 50

## 2015-02-24 MED ORDER — MIDAZOLAM HCL 5 MG/5ML IJ SOLN
INTRAMUSCULAR | Status: AC
  Filled 2015-02-24: qty 25

## 2015-02-24 MED ORDER — ACETAMINOPHEN 325 MG PO TABS: 325.0000 mg | ORAL_TABLET | ORAL | Status: DC | PRN

## 2015-02-24 MED ORDER — ASPIRIN EC 81 MG PO TBEC
81.0000 mg | DELAYED_RELEASE_TABLET | Freq: Every day | ORAL | Status: DC
  Administered 2015-02-25: 81 mg via ORAL
  Filled 2015-02-24: qty 1

## 2015-02-24 MED ORDER — ISOSORBIDE MONONITRATE ER 30 MG PO TB24
30.0000 mg | ORAL_TABLET | Freq: Every day | ORAL | Status: DC
  Administered 2015-02-24: 19:00:00 30 mg via ORAL
  Filled 2015-02-24 (×2): qty 1

## 2015-02-24 MED ORDER — LIDOCAINE HCL (PF) 1 % IJ SOLN
INTRAMUSCULAR | Status: AC
  Filled 2015-02-24: qty 30

## 2015-02-24 MED ORDER — SODIUM CHLORIDE 0.9 % IR SOLN
Status: AC
  Filled 2015-02-24: qty 2

## 2015-02-24 MED ORDER — LIDOCAINE HCL (PF) 1 % IJ SOLN
INTRAMUSCULAR | Status: DC | PRN
  Administered 2015-02-24: 50 mL via INTRADERMAL

## 2015-02-24 MED ORDER — SODIUM CHLORIDE 0.9 % IV SOLN
INTRAVENOUS | Status: DC
  Administered 2015-02-24: 14:00:00 via INTRAVENOUS

## 2015-02-24 MED ORDER — FENTANYL CITRATE (PF) 100 MCG/2ML IJ SOLN
INTRAMUSCULAR | Status: DC | PRN
  Administered 2015-02-24 (×5): 12.5 ug via INTRAVENOUS

## 2015-02-24 MED ORDER — AMLODIPINE BESYLATE 5 MG PO TABS
2.5000 mg | ORAL_TABLET | Freq: Every day | ORAL | Status: DC
  Administered 2015-02-25: 09:00:00 2.5 mg via ORAL
  Filled 2015-02-24: qty 1

## 2015-02-24 MED ORDER — ACETAMINOPHEN 500 MG PO TABS
1000.0000 mg | ORAL_TABLET | Freq: Two times a day (BID) | ORAL | Status: DC | PRN
  Administered 2015-02-25: 02:00:00 1000 mg via ORAL
  Filled 2015-02-24: qty 2

## 2015-02-24 MED ORDER — CARVEDILOL 12.5 MG PO TABS
12.5000 mg | ORAL_TABLET | Freq: Two times a day (BID) | ORAL | Status: DC
  Administered 2015-02-24 – 2015-02-25 (×2): 12.5 mg via ORAL
  Filled 2015-02-24 (×2): qty 1

## 2015-02-24 MED ORDER — MUPIROCIN 2 % EX OINT
1.0000 "application " | TOPICAL_OINTMENT | Freq: Once | CUTANEOUS | Status: AC
  Administered 2015-02-24: 1 via TOPICAL
  Filled 2015-02-24: qty 22

## 2015-02-24 MED ORDER — CEFAZOLIN SODIUM 1-5 GM-% IV SOLN
1.0000 g | Freq: Four times a day (QID) | INTRAVENOUS | Status: AC
  Administered 2015-02-24 – 2015-02-25 (×3): 1 g via INTRAVENOUS
  Filled 2015-02-24 (×3): qty 50

## 2015-02-24 MED ORDER — HEPARIN (PORCINE) IN NACL 2-0.9 UNIT/ML-% IJ SOLN
INTRAMUSCULAR | Status: DC | PRN
  Administered 2015-02-24: 15:00:00

## 2015-02-24 MED ORDER — SODIUM CHLORIDE 0.9 % IR SOLN: Status: DC | PRN

## 2015-02-24 MED ORDER — LINAGLIPTIN 5 MG PO TABS
5.0000 mg | ORAL_TABLET | Freq: Every day | ORAL | Status: DC
  Administered 2015-02-25: 5 mg via ORAL
  Filled 2015-02-24: qty 1

## 2015-02-24 MED ORDER — ALLOPURINOL 100 MG PO TABS
100.0000 mg | ORAL_TABLET | Freq: Every day | ORAL | Status: DC
  Filled 2015-02-24 (×2): qty 1

## 2015-02-24 MED ORDER — MIDAZOLAM HCL 5 MG/5ML IJ SOLN
INTRAMUSCULAR | Status: DC | PRN
  Administered 2015-02-24 (×5): 1 mg via INTRAVENOUS

## 2015-02-24 MED ORDER — GLIPIZIDE ER 5 MG PO TB24
5.0000 mg | ORAL_TABLET | Freq: Two times a day (BID) | ORAL | Status: DC
  Administered 2015-02-24 – 2015-02-25 (×2): 5 mg via ORAL
  Filled 2015-02-24 (×5): qty 1

## 2015-02-24 MED ORDER — MUPIROCIN 2 % EX OINT
TOPICAL_OINTMENT | CUTANEOUS | Status: AC
Start: 2015-02-24 — End: 2015-02-24
  Administered 2015-02-24: 1 via TOPICAL
  Filled 2015-02-24: qty 22

## 2015-02-24 MED ORDER — GABAPENTIN 300 MG PO CAPS
300.0000 mg | ORAL_CAPSULE | Freq: Two times a day (BID) | ORAL | Status: DC
  Administered 2015-02-24 – 2015-02-25 (×2): 300 mg via ORAL
  Filled 2015-02-24 (×2): qty 1

## 2015-02-24 MED ORDER — FUROSEMIDE 40 MG PO TABS
40.0000 mg | ORAL_TABLET | Freq: Two times a day (BID) | ORAL | Status: DC
  Administered 2015-02-24 – 2015-02-25 (×2): 40 mg via ORAL
  Filled 2015-02-24 (×2): qty 1

## 2015-02-24 SURGICAL SUPPLY — 15 items
CABLE SURGICAL S-101-97-12 (CABLE) ×3 IMPLANT
CATH ATTAIN SELECT 6238TEL (CATHETERS) ×3 IMPLANT
CATH CPS DIRECT 135 DS2C020 (CATHETERS) ×3 IMPLANT
CATH HEX JOSEPH 2-5-2 65CM 6F (CATHETERS) ×3 IMPLANT
CPS IMPLANT KIT 410190 (MISCELLANEOUS) ×3 IMPLANT
GUIDEWIRE ANGLED .035X150CM (WIRE) ×3 IMPLANT
LEAD QUARTET 1458QL-86 (Lead) ×1 IMPLANT
LEAD TENDRIL SDX 1688TC-52CM (Lead) ×3 IMPLANT
PAD DEFIB LIFELINK (PAD) ×3 IMPLANT
QUARTET 1458QL-86 (Lead) ×3 IMPLANT
SHEATH CLASSIC 9F (SHEATH) ×3 IMPLANT
SHEATH COOK PEEL AWAY SET 9F (SHEATH) IMPLANT
SLITTER UNIVERSAL DS2A003 (MISCELLANEOUS) ×3 IMPLANT
TRAY PACEMAKER INSERTION (CUSTOM PROCEDURE TRAY) ×3 IMPLANT
WIRE MAILMAN 182CM (WIRE) ×6 IMPLANT

## 2015-02-24 NOTE — Interval H&P Note (Signed)
History and Physical Interval Note:  02/24/2015 12:59 PM  [name]  has presented today for surgery, with the diagnosis of bad lead  The various methods of treatment have been discussed with the patient and family. After consideration of risks, benefits and other options for treatment, the patient has consented to  Procedure(s): Lead Revision/Repair (N/A) as a surgical intervention .  The patient's history has been reviewed, patient examined, no change in status, stable for surgery.  I have reviewed the patient's chart and labs.  Questions were answered to the patient's satisfaction.     [name]

## 2015-02-24 NOTE — H&P (View-Only) (Signed)
HPI [name] returns today for ongoing ICD followup. He is a pleasant 69 yo man with chronic systolic heart failure, CAD, s/p CABG, LBBB, who underwent BiV ICD implant approx. 3.5 months ago. He was seen by me several weeks ago and was stable. He developed diaphragmatic stimulation 5 days ago. 3 days ago he developed chest wall stimulation. He was found on CXR to have retraction of his LV lead back to the subclavian vein. He denies any trouble or unusual movement of his left arm. Allergies  Allergen Reactions  . Ace Inhibitors Anaphylaxis  . Codeine Other (See Comments)     delirium  . Lisinopril Anaphylaxis and Swelling  . Hydromorphone Nausea And Vomiting     Current Outpatient Prescriptions  Medication Sig Dispense Refill  . acetaminophen (TYLENOL) 500 MG tablet Take 1,000 mg by mouth 2 (two) times daily as needed for headache (pain).    Marland Kitchen allopurinol (ZYLOPRIM) 100 MG tablet Take 100 mg by mouth daily with supper.     Marland Kitchen amLODipine (NORVASC) 2.5 MG tablet Take 1 tablet (2.5 mg total) by mouth daily. 90 tablet 3  . aspirin EC 81 MG tablet Take 81 mg by mouth daily.    . carvedilol (COREG) 25 MG tablet Take 0.5 tablets (12.5 mg total) by mouth 2 (two) times daily with a meal. 30 tablet 6  . furosemide (LASIX) 40 MG tablet Take 40 mg twice a day, may take extra 40 mg daily for weight gain over 3 lbs overnite 180 tablet 3  . gabapentin (NEURONTIN) 300 MG capsule Take 300 mg by mouth 2 (two) times daily.    Marland Kitchen glipiZIDE (GLUCOTROL XL) 10 MG 24 hr tablet Take 5 mg by mouth 2 (two) times daily.     . isosorbide mononitrate (IMDUR) 60 MG 24 hr tablet Take 2 tablets (120 mg total) by mouth every morning. & 30 mg in the evening (Patient taking differently: Take 30-120 mg by mouth 2 (two) times daily. Take 2 tablets (120 mg) every morning and 1/2 tablet (30 mg) with supper) 90 tablet 3  . linagliptin (TRADJENTA) 5 MG TABS tablet Take 5 mg by mouth daily.      . metFORMIN (GLUCOPHAGE)  500 MG tablet Take by mouth 2 (two) times daily with a meal.    . NITROSTAT 0.4 MG SL tablet PLACE 1 TABLET UNDER THE TONGUE EVERY 5 MINUTES UP TO 3 DOSES AS NEEDED FOR CHEST PAIN. 25 tablet 3  . simvastatin (ZOCOR) 20 MG tablet Take 1 tablet (20 mg total) by mouth at bedtime. 90 tablet 3   No current facility-administered medications for this visit.     Past Medical History  Diagnosis Date  . Ischemic cardiomyopathy     LVEF 45-50% 11/2011  . Coronary atherosclerosis of native coronary artery     Multivessel status post CABG  . Angioedema     Secondary to ACE inhibitor  . Bell palsy   . Type 2 diabetes mellitus   . AAA (abdominal aortic aneurysm)     Followed by Dr. Donnetta Hutching  . Essential hypertension, benign   . PAD (peripheral artery disease)     Followed by Dr. Donnetta Hutching  . Carotid artery disease   . Atrial fibrillation   . Hernia of abdominal wall   . AICD (automatic cardioverter/defibrillator) present   . Nephrolithiasis   . Hypercholesteremia   . Myocardial infarction   . Migraines   . Arthritis   . Gout   .  Sleep apnea     ROS:   All systems reviewed and negative except as noted in the HPI.   Past Surgical History  Procedure Laterality Date  . Dupuytren contracture release Left 2009  . Cataract extraction w/phaco  03/23/2011    Procedure: CATARACT EXTRACTION PHACO AND INTRAOCULAR LENS PLACEMENT (IOC);  Surgeon: Tonny Branch;  Location: [facility];  Service: Ophthalmology;  Laterality: Left;  CDE: 15.99  . Cataract extraction w/phaco  04/17/2011    Procedure: CATARACT EXTRACTION PHACO AND INTRAOCULAR LENS PLACEMENT (IOC);  Surgeon: Tonny Branch;  Location: [facility];  Service: Ophthalmology;  Laterality: Right;  CDE: 23.45  . Foot surgery Left 1963    "gangrene"  . Eye surgery    . Bypass graft popliteal to popliteal Left 06/08/2014    Procedure: BYPASS GRAFT FEMORAL ARTERY TO ABOVE KNEE POPLITEAL;  Surgeon: Rosetta Posner, MD;  Location: [facility];  Service: Vascular;  Laterality:  Left;  . Abdominal aortic aneurysm repair  November 19, 2013    [facility] :  Dr. Sammuel Hines  . Abdominal aortic aneurysm repair  09-07-2014    [facility]  . Bi-ventricular implantable cardioverter defibrillator  (crt-d)  11/05/2014  . Extracorporeal shock wave lithotripsy  X 1  . Cardiac catheterization N/A 10/02/2014    Procedure: Left Heart Cath and Cors/Grafts Angiography;  Surgeon: Belva Crome, MD;  Location: [facility];  Service: Cardiovascular;  Laterality: N/A;  . Cardiac catheterization  "several"  . Coronary artery bypass graft  2001    LIMA to LAD, SVG to diagonal and ramus, SVG to OM, SVG to AM  . Ep implantable device N/A 11/05/2014    Procedure: BiV ICD Insertion CRT-D;  Surgeon: Evans Lance, MD;  Location: [facility];  Service: Cardiovascular;  Laterality: N/A;     Family History  Problem Relation Age of Onset  . Diabetes Mother     Type  I   . Varicose Veins Mother   . Heart disease Father 55    AAA  . AAA (abdominal aortic aneurysm) Father   . Hyperlipidemia Father   . Hypertension Father      Social History   Social History  . Marital Status: Divorced    Spouse Name: N/A  . Number of Children: N/A  . Years of Education: N/A   Occupational History  . Not on file.   Social History Main Topics  . Smoking status: Former Smoker -- 1.00 packs/day for 25 years    Types: Cigarettes    Start date: 02/28/1979    Quit date: 05/23/2007  . Smokeless tobacco: Never Used  . Alcohol Use: No  . Drug Use: No  . Sexual Activity: No   Other Topics Concern  . Not on file   Social History Narrative     BP 128/70 mmHg  Pulse 77  Ht 6\' 1"  (1.854 m)  Wt 243 lb 9.6 oz (110.496 kg)  BMI 32.15 kg/m2  SpO2 95%  Physical Exam:  Well appearing 69 yo man, NAD HEENT: Unremarkable Neck:  7 cm JVD, no thyromegally Back:  No CVA tenderness Lungs:  Clear with no wheezes HEART:  Regular rate rhythm, no murmurs, no rubs, no clicks Abd:  soft,  positive bowel sounds, no organomegally, no rebound, no guarding Ext:   no edema, no cyanosis, no clubbing Skin:  No rashes no nodules Neuro:  CN II through XII intact, motor grossly intact      Assess/Plan:

## 2015-02-25 ENCOUNTER — Ambulatory Visit (HOSPITAL_COMMUNITY): Payer: Medicare Other

## 2015-02-25 ENCOUNTER — Encounter (HOSPITAL_COMMUNITY): Payer: Self-pay | Admitting: General Practice

## 2015-02-25 DIAGNOSIS — Z951 Presence of aortocoronary bypass graft: Secondary | ICD-10-CM | POA: Diagnosis not present

## 2015-02-25 DIAGNOSIS — T82198D Other mechanical complication of other cardiac electronic device, subsequent encounter: Secondary | ICD-10-CM

## 2015-02-25 DIAGNOSIS — Z7982 Long term (current) use of aspirin: Secondary | ICD-10-CM | POA: Diagnosis not present

## 2015-02-25 DIAGNOSIS — Z4502 Encounter for adjustment and management of automatic implantable cardiac defibrillator: Secondary | ICD-10-CM | POA: Diagnosis not present

## 2015-02-25 DIAGNOSIS — Z95810 Presence of automatic (implantable) cardiac defibrillator: Secondary | ICD-10-CM | POA: Diagnosis not present

## 2015-02-25 DIAGNOSIS — I2510 Atherosclerotic heart disease of native coronary artery without angina pectoris: Secondary | ICD-10-CM | POA: Diagnosis not present

## 2015-02-25 DIAGNOSIS — I447 Left bundle-branch block, unspecified: Secondary | ICD-10-CM | POA: Diagnosis not present

## 2015-02-25 DIAGNOSIS — I5022 Chronic systolic (congestive) heart failure: Secondary | ICD-10-CM | POA: Diagnosis not present

## 2015-02-25 LAB — GLUCOSE, CAPILLARY: GLUCOSE-CAPILLARY: 107 mg/dL — AB (ref 65–99)

## 2015-02-25 MED ORDER — TRAMADOL HCL 50 MG PO TABS: 50.0000 mg | ORAL_TABLET | Freq: Four times a day (QID) | ORAL | Status: DC | PRN

## 2015-02-25 NOTE — Consult Note (Signed)
   [facility] CM Inpatient Consult   02/25/2015  [name] 12-24-1945 [id number]   Made aware of patient's admission by Est[place]. [name] is active with [facility] services. He is being followed by [facility] RNCM and [facility] Pharmacist. Please see chart review then notes in EPIC for [facility] correspondence with patient. Sent notification to inpatient RNCM that patient is active with [facility]. Will continue to follow.  Marthenia Rolling, MSN-Ed, RN,BSN [facility] Liaison [phone]

## 2015-02-25 NOTE — Discharge Summary (Signed)
ELECTROPHYSIOLOGY PROCEDURE DISCHARGE SUMMARY    Patient ID: [name],  MRN: [id number], DOB/AGE: [birth year] 69 y.o.  Admit date: 02/24/2015 Discharge date: 02/25/2015  Primary Care Physician: Rosita Fire, MD Primary Cardiologist: Dr. Domenic Polite Electrophysiologist: Dr. Lovena Le  Primary Discharge Diagnosis:  Ischemic Cardiomyopathy s/p CRT ICD LV lead replacement, RA lead replacement  Secondary Discharge Diagnosis:  CAD s/p CABG HTN PVD (Carotid) AAA with revision of stent graft leak PAFib, this is an unclear listed history, will be followed via his device   Allergies  Allergen Reactions  . Ace Inhibitors Anaphylaxis  . Codeine Other (See Comments)     delirium  . Lisinopril Anaphylaxis and Swelling  . Hydromorphone Nausea And Vomiting     Procedures This Admission:  1.  LV lead revision/new LV lead and RA lead replacement by Dr Lovena Le.  See OP note for full details.  There were no immediate post procedure complications. 2.  CXR on 02/25/15 demonstrated no pneumothorax status post procedure 3. EKG is SR with V pacing  Brief HPI: [name] is a 69 y.o. male was seen in the outpatient setting for diaphragmatic and chest wall stimulation with LV lead retraction noted.  Past medical history includes CAD, ICM, PVD, Hyperlipidemia, and HTN.  AFib is listed in his PMH though this is unclear and will be monitored for out patient via his device.  Risks, benefits, and alternatives were reviewed with the patient who wished to proceed.   Hospital Course:  The patient was admitted to undergo LV lead revision after developing diaphragmatic stimulation and LV lead retraction.  He had a new LV and RA lead implanted, see OP notes for full details.  He was monitored on telemetry overnight which demonstrated SR with V pacing.  Left chest was without hematoma or ecchymosis.  The device was interrogated and found to be functioning normally.  CXR was obtained and  demonstrated no pneumothorax status post procedure.  Wound care, arm mobility, and restrictions were reviewed with the patient.  The patient was examined by Dr. Lovena Le and considered stable for discharge to home.   The patient's discharge medications does not include an ACE-I secondary to anaphylaxis, he is on a beta blocker (carvedilol).   Physical Exam: Filed Vitals:   02/24/15 1800 02/24/15 1959 02/25/15 0304 02/25/15 0804  BP: 154/65 157/60 169/75 153/68  Pulse: 72 66 67 67  Temp:  98 F (36.7 C) 98 F (36.7 C) 98 F (36.7 C)  TempSrc:  Oral Oral Oral  Resp: 20 16 18 13   Height:      Weight:   231 lb 11.3 oz (105.1 kg)   SpO2: 94% 94% 93% 93%    GEN- The patient is well appearing, alert and oriented x 3 today.   HEENT: normocephalic, atraumatic; sclera clear, conjunctiva pink; hearing intact; oropharynx clear; neck supple Lungs- Clear to ausculation bilaterally, normal work of breathing.  No wheezes, rales, rhonchi Heart- Regular rate and rhythm, no murmurs, rubs or gallops, PMI not laterally displaced GI- soft, non-tender, non-distended, bowel sounds present Extremities- no clubbing, cyanosis, or edema MS- no significant deformity or atrophy Skin- warm and dry, no rash or lesion, left chest without hematoma/ecchymosis Psych- euthymic mood, full affect Neuro- no gross deficits   Labs:   Lab Results  Component Value Date   WBC 11.2* 02/19/2015   HGB 12.9* 02/19/2015   HCT 39.7 02/19/2015   MCV 93.6 02/19/2015   PLT 327 02/19/2015    Recent Labs Lab  02/19/15 1154  NA 140  K 3.5  CL 101  CO2 31  BUN 21*  CREATININE 1.84*  CALCIUM 9.2  GLUCOSE 82    Discharge Medications:    Medication List    TAKE these medications        acetaminophen 500 MG tablet  Commonly known as:  TYLENOL  Take 1,000 mg by mouth 2 (two) times daily as needed for headache (pain).     allopurinol 100 MG tablet  Commonly known as:  ZYLOPRIM  Take 100 mg by mouth daily with  supper.     amLODipine 2.5 MG tablet  Commonly known as:  NORVASC  Take 1 tablet (2.5 mg total) by mouth daily.     aspirin EC 81 MG tablet  Take 81 mg by mouth daily.     carvedilol 25 MG tablet  Commonly known as:  COREG  Take 0.5 tablets (12.5 mg total) by mouth 2 (two) times daily with a meal.     furosemide 40 MG tablet  Commonly known as:  LASIX  Take 40 mg twice a day, may take extra 40 mg daily for weight gain over 3 lbs overnite     gabapentin 300 MG capsule  Commonly known as:  NEURONTIN  Take 300 mg by mouth 2 (two) times daily.     glipiZIDE 10 MG 24 hr tablet  Commonly known as:  GLUCOTROL XL  Take 5 mg by mouth 2 (two) times daily.     isosorbide mononitrate 60 MG 24 hr tablet  Commonly known as:  IMDUR  Take 2 tablets (120 mg total) by mouth every morning. & 30 mg in the evening     linagliptin 5 MG Tabs tablet  Commonly known as:  TRADJENTA  Take 5 mg by mouth daily.     metFORMIN 500 MG tablet  Commonly known as:  GLUCOPHAGE  Take 500 mg by mouth 2 (two) times daily with a meal.     NITROSTAT 0.4 MG SL tablet  Generic drug:  nitroGLYCERIN  PLACE 1 TABLET UNDER THE TONGUE EVERY 5 MINUTES UP TO 3 DOSES AS NEEDED FOR CHEST PAIN.     simvastatin 20 MG tablet  Commonly known as:  ZOCOR  Take 1 tablet (20 mg total) by mouth at bedtime.     traMADol 50 MG tablet  Commonly known as:  ULTRAM  Take 1 tablet (50 mg total) by mouth every 6 (six) hours as needed.        Disposition:      Discharge Instructions    Diet - low sodium heart healthy    Complete by:  As directed      Increase activity slowly    Complete by:  As directed           Follow-up Information    Follow up with [facility] On 03/08/2015.   Why:  at 12noon for wound check   Contact information:   [address] [place] [phone]       Duration of Discharge Encounter: Greater than 30 minutes including physician time.  Venetia Night, PA-C  02/25/2015 10:17 AM   EP Attending  Patient seen and examined. Agree with the findings as noted above. [place] for discharge. Usual followup.  Mikle Bosworth.D.

## 2015-02-25 NOTE — Progress Notes (Signed)
Patient ID: DEYTON FENERTY, male   DOB: 12/14/1945, 69 y.o.   MRN: [id number]    Patient Name: [name] Date of Encounter: 02/25/2015     Active Problems:   ICD (implantable cardioverter-defibrillator) malfunction    SUBJECTIVE  S/p ICD LV lead revision doing well. No chest pain or sob. Minimal incisional pain.   CURRENT MEDS . allopurinol  100 mg Oral Q supper  . amLODipine  2.5 mg Oral Daily  . aspirin EC  81 mg Oral Daily  . carvedilol  12.5 mg Oral BID WC  . furosemide  40 mg Oral BID  . gabapentin  300 mg Oral BID  . glipiZIDE  5 mg Oral BID AC  . isosorbide mononitrate  120 mg Oral q morning - 10a  . isosorbide mononitrate  30 mg Oral q1800  . linagliptin  5 mg Oral QAC breakfast  . simvastatin  20 mg Oral QHS    OBJECTIVE  Filed Vitals:   02/24/15 1800 02/24/15 1959 02/25/15 0304 02/25/15 0804  BP: 154/65 157/60 169/75 153/68  Pulse: 72 66 67 67  Temp:  98 F (36.7 C) 98 F (36.7 C) 98 F (36.7 C)  TempSrc:  Oral Oral Oral  Resp: 20 16 18 13   Height:      Weight:   231 lb 11.3 oz (105.1 kg)   SpO2: 94% 94% 93% 93%    Intake/Output Summary (Last 24 hours) at 02/25/15 1001 Last data filed at 02/25/15 0630  Gross per 24 hour  Intake    440 ml  Output   1300 ml  Net   -860 ml   Filed Weights   02/24/15 1353 02/25/15 0304  Weight: 235 lb (106.595 kg) 231 lb 11.3 oz (105.1 kg)    PHYSICAL EXAM  General: Pleasant, NAD. Neuro: Alert and oriented X 3. Moves all extremities spontaneously. Psych: Normal affect. HEENT:  Normal  Neck: Supple without bruits or JVD. Lungs:  Resp regular and unlabored, CTA. No hematoma Heart: RRR no s3, s4, or murmurs. Abdomen: Soft, non-tender, non-distended, BS + x 4.  Extremities: No clubbing, cyanosis or edema. DP/PT/Radials 2+ and equal bilaterally.  Accessory Clinical Findings  CBC No results for input(s): WBC, NEUTROABS, HGB, HCT, MCV, PLT in the last 72 hours. Basic Metabolic Panel No results  for input(s): NA, K, CL, CO2, GLUCOSE, BUN, CREATININE, CALCIUM, MG, PHOS in the last 72 hours. Liver Function Tests No results for input(s): AST, ALT, ALKPHOS, BILITOT, PROT, ALBUMIN in the last 72 hours. No results for input(s): LIPASE, AMYLASE in the last 72 hours. Cardiac Enzymes No results for input(s): CKTOTAL, CKMB, CKMBINDEX, TROPONINI in the last 72 hours. BNP Invalid input(s): POCBNP D-Dimer No results for input(s): DDIMER in the last 72 hours. Hemoglobin A1C No results for input(s): HGBA1C in the last 72 hours. Fasting Lipid Panel No results for input(s): CHOL, HDL, LDLCALC, TRIG, CHOLHDL, LDLDIRECT in the last 72 hours. Thyroid Function Tests No results for input(s): TSH, T4TOTAL, T3FREE, THYROIDAB in the last 72 hours.  Invalid input(s): FREET3  TELE  nsr with biv pacing  Radiology/Studies  Dg Chest 2 View  02/25/2015   CLINICAL DATA:  AICD.  EXAM: CHEST  2 VIEW  COMPARISON:  02/17/2015.  FINDINGS: New AICD device is noted with lead tips in good anatomic position. Prior CABG. Cardiomegaly normal pulmonary vascularity. No focal infiltrate. No pleural effusion or pneumothorax. Prior aortic stent graft.  IMPRESSION: 1. New AICD device noted with lead tips in good  anatomic position. 2. Prior CABG.  Mild cardiomegaly.  No pulmonary venous congestion.   Electronically Signed   By: Marcello Moores  Register   On: 02/25/2015 07:57   Dg Chest 2 View  02/17/2015   CLINICAL DATA:  Pacemaker missed firing. Ex-smoker with history of heart surgery. Initial encounter.  EXAM: CHEST  2 VIEW  COMPARISON:  Radiographs 11/19/2014 and 11/06/2014.  FINDINGS: The atrial and ventricular leads of the left subclavian AICD appear unchanged. A previously demonstrated lead within the coronary sinus is no longer clearly demonstrated. The heart size and mediastinal contours are stable status post CABG. The lungs are hyperinflated. There is no edema, confluent airspace opacity or pleural effusion. Abdominal stent  graft noted in the upper abdomen.  IMPRESSION: Stable postoperative chest. Coronary sinus lead of the AICD is no longer demonstrated, although not displaced within the chest.   Electronically Signed   By: Richardean Sale M.D.   On: 02/17/2015 14:44    ASSESSMENT AND PLAN  1. LV lead dislodgement 2. S/p Biv ICD revision with insertion of a new LV lead, s/p insertion of a new atrial lead which had to be salvaged to obtain central access Rec: ok for discharge with usual followup.   Syra Sirmons,M.D.  02/25/2015 10:01 AM

## 2015-02-25 NOTE — Discharge Instructions (Signed)
° ° °  Supplemental Discharge Instructions for  Pacemaker/Defibrillator Patients  Activity No heavy lifting or vigorous activity with your left/right arm for 6 to 8 weeks.  Do not raise your left/right arm above your head for one week.  Gradually raise your affected arm as drawn below.           __    03/01/15                  03/02/15                03/03/15                03/04/15  NO DRIVING for   1 week  ; you may begin driving on  [id number]   .  WOUND CARE - Keep the wound area clean and dry.  Do not get this area wet for one week. No showers for one week; you may shower on   03/04/15  . - The tape/steri-strips on your wound will fall off; do not pull them off.  No bandage is needed on the site.  DO  NOT apply any creams, oils, or ointments to the wound area. - If you notice any drainage or discharge from the wound, any swelling or bruising at the site, or you develop a fever > 101? F after you are discharged home, call the office at once.  Special Instructions - You are still able to use cellular telephones; use the ear opposite the side where you have your pacemaker/defibrillator.  Avoid carrying your cellular phone near your device. - When traveling through airports, show security personnel your identification card to avoid being screened in the metal detectors.  Ask the security personnel to use the hand wand. - Avoid arc welding equipment, MRI testing (magnetic resonance imaging), TENS units (transcutaneous nerve stimulators).  Call the office for questions about other devices. - Avoid electrical appliances that are in poor condition or are not properly grounded. - Microwave ovens are safe to be near or to operate.  Additional information for defibrillator patients should your device go off: - If your device goes off ONCE and you feel fine afterward, notify the device clinic nurses. - If your device goes off ONCE and you do not feel well afterward, call 911. - If your device goes  off TWICE, call 911. - If your device goes off THREE times in one day, call 911.  DO NOT DRIVE YOURSELF OR A FAMILY MEMBER WITH A DEFIBRILLATOR TO THE HOSPITAL--CALL 911.

## 2015-02-26 ENCOUNTER — Other Ambulatory Visit: Payer: Self-pay | Admitting: *Deleted

## 2015-02-26 NOTE — Patient Outreach (Signed)
02/26/15- Telephone call to patient for transition of care week 1, HIPAA verified, pt states he has had "no more heart symptoms since that lead was fixed". pt to see cardiologist 03/08/15 and verbalizes understanding. Pt did not weigh today but states he has been doing better with daily weights before he went to hospital and will resume tomorrow.  Pt also not checking CBG daily, did not check today, RN CM ask pt to start checking daily and recording as well as weight.  RN CM reviewed action plan/ 24 hour nurse line.  THN CM Care Plan Problem One        Most Recent Value   Care Plan Problem One  knowledge deficit related to CHF   Role Documenting the Problem One  Care Management [place] for Problem One  Active   THN Long Term Goal (31-90 days)  pt will have no hospitalizations related to CHF within 90 days.   THN Long Term Goal Start Date  02/16/15   THN CM Short Term Goal #1 (0-30 days)  pt will verbalize CHF zones within 30 days.   THN CM Short Term Goal #1 Start Date  02/16/15   Interventions for Short Term Goal #1  RN CM reviwed CHF zones, reminded to call MD early with change in symptoms, health status.   THN CM Short Term Goal #2 (0-30 days)  pt will weigh daily and record   THN CM Short Term Goal #2 Start Date  02/16/15   Interventions for Short Term Goal #2  RN CM reinforced/ instructed pt to weigh daily at same time and keep log, reviewed why it is important to weigh daily.    THN CM Care Plan Problem Two        Most Recent Value   Care Plan Problem Two  knowledge deficit related to diabetes   Role Documenting the Problem Two  Care Management Coordinator   Care Plan for Problem Two  Active   THN CM Short Term Goal #1 (0-30 days)  pt will check blood sugar daily and record   THN CM Short Term Goal #1 Start Date  02/16/15   Interventions for Short Term Goal #2   RN CM reviewed importance of checking CBG daily and recording and taking log to primary MD appointments, pt  still is not checking CBG daily.     PLAN Continue weekly transition of care calls  Jacqlyn Larsen [facility], [place] Coordinator [phone]

## 2015-03-04 ENCOUNTER — Encounter: Payer: Self-pay | Admitting: Internal Medicine

## 2015-03-05 ENCOUNTER — Other Ambulatory Visit: Payer: Self-pay | Admitting: *Deleted

## 2015-03-05 NOTE — Patient Outreach (Signed)
03/05/15- Telephone call to patient for transition of care week 2, HIPAA verified, pt reports he is weighing daily and weight today 233 pounds, checking CBG most days with ranges 2 hours after eating 120-130's.  Pt reports he has slight edema lower extremties which he has most every day.  Pt denies dyspnea and states " I've only had one episode of shortness of breath in the past few days"  Pt to see Dr. Lovena Le 03/08/15, pt reports taking all medications as prescribed.  THN CM Care Plan Problem One        Most Recent Value   Care Plan Problem One  knowledge deficit related to CHF   Role Documenting the Problem One  Care Management [place] for Problem One  Active   THN Long Term Goal (31-90 days)  pt will have no hospitalizations related to CHF within 90 days.   THN Long Term Goal Start Date  02/16/15   Interventions for Problem One Long Term Goal  RN CM reinforced CHF zones/ action plan   THN CM Short Term Goal #1 (0-30 days)  pt will verbalize CHF zones within 30 days.   THN CM Short Term Goal #1 Start Date  02/16/15   Interventions for Short Term Goal #1  RN CM reminded pt to call early for change in symptoms, health status.   THN CM Short Term Goal #2 (0-30 days)  pt will weigh daily and record   THN CM Short Term Goal #2 Start Date  02/16/15   Interventions for Short Term Goal #2  RN CM reinforced/ instructed pt to weigh daily at same time and keep log, reviewed why it is important to weigh daily., RN CM will evaluate at in home visit next week.    THN CM Care Plan Problem Two        Most Recent Value   Care Plan Problem Two  knowledge deficit related to diabetes   Role Documenting the Problem Two  Care Management Coordinator   Care Plan for Problem Two  Active   THN CM Short Term Goal #1 (0-30 days)  pt will check blood sugar daily and record   THN CM Short Term Goal #1 Start Date  02/16/15   Interventions for Short Term Goal #2   RN CM reviewed importance of checking CBG  daily and recording and taking log to primary MD appointments,RN CM will evaluate at in home visit next week      PLAN See pt for home visit 03/09/15/ transition of care week 3 Continue CHF, diabetes teaching, reinforcement, assess CBG and weight log  Jacqlyn Larsen [facility], [place] Coordinator [phone]

## 2015-03-08 ENCOUNTER — Other Ambulatory Visit (INDEPENDENT_AMBULATORY_CARE_PROVIDER_SITE_OTHER): Payer: Medicare Other | Admitting: *Deleted

## 2015-03-08 ENCOUNTER — Ambulatory Visit (HOSPITAL_COMMUNITY)
Admission: RE | Admit: 2015-03-08 | Discharge: 2015-03-08 | Disposition: A | Discharge status: Home / Self Care | Payer: Medicare Other | Source: Ambulatory Visit | Attending: Internal Medicine | Admitting: Internal Medicine

## 2015-03-08 ENCOUNTER — Telehealth: Payer: Self-pay | Admitting: Cardiology

## 2015-03-08 ENCOUNTER — Ambulatory Visit (INDEPENDENT_AMBULATORY_CARE_PROVIDER_SITE_OTHER): Payer: Medicare Other | Admitting: *Deleted

## 2015-03-08 DIAGNOSIS — I255 Ischemic cardiomyopathy: Secondary | ICD-10-CM | POA: Diagnosis not present

## 2015-03-08 DIAGNOSIS — Z950 Presence of cardiac pacemaker: Secondary | ICD-10-CM | POA: Diagnosis not present

## 2015-03-08 DIAGNOSIS — I472 Ventricular tachycardia, unspecified: Secondary | ICD-10-CM

## 2015-03-08 DIAGNOSIS — I10 Essential (primary) hypertension: Secondary | ICD-10-CM

## 2015-03-08 DIAGNOSIS — I5022 Chronic systolic (congestive) heart failure: Secondary | ICD-10-CM

## 2015-03-08 DIAGNOSIS — Z95810 Presence of automatic (implantable) cardiac defibrillator: Secondary | ICD-10-CM | POA: Insufficient documentation

## 2015-03-08 DIAGNOSIS — Z951 Presence of aortocoronary bypass graft: Secondary | ICD-10-CM | POA: Diagnosis not present

## 2015-03-08 DIAGNOSIS — I517 Cardiomegaly: Secondary | ICD-10-CM | POA: Diagnosis not present

## 2015-03-08 DIAGNOSIS — R5382 Chronic fatigue, unspecified: Secondary | ICD-10-CM

## 2015-03-08 LAB — BASIC METABOLIC PANEL
BUN: 22 mg/dL (ref 7–25)
CALCIUM: 9.7 mg/dL (ref 8.6–10.3)
CO2: 29 mmol/L (ref 20–31)
CREATININE: 1.68 mg/dL — AB (ref 0.70–1.25)
Chloride: 102 mmol/L (ref 98–110)
Glucose, Bld: 100 mg/dL — ABNORMAL HIGH (ref 65–99)
Potassium: 4 mmol/L (ref 3.5–5.3)
SODIUM: 143 mmol/L (ref 135–146)

## 2015-03-08 LAB — MAGNESIUM: MAGNESIUM: 2.2 mg/dL (ref 1.5–2.5)

## 2015-03-08 LAB — TSH: TSH: 3.364 u[IU]/mL (ref 0.350–4.500)

## 2015-03-08 MED ORDER — POTASSIUM CHLORIDE CRYS ER 20 MEQ PO TBCR: 20.0000 meq | EXTENDED_RELEASE_TABLET | Freq: Every day | ORAL | Status: DC

## 2015-03-08 NOTE — Telephone Encounter (Signed)
The patient said Dr. Domenic Polite increased his Furosemide at the last visit but there was not a prescription called in. He said he has enough for 1 more day. Patient confirmed his pharmacy is [facility] in [place].   Please call the patient once the refill has been sent to his pharmacist.

## 2015-03-09 ENCOUNTER — Encounter: Payer: Self-pay | Admitting: *Deleted

## 2015-03-09 ENCOUNTER — Other Ambulatory Visit: Payer: Self-pay | Admitting: *Deleted

## 2015-03-09 LAB — CUP PACEART INCLINIC DEVICE CHECK
Battery Remaining Longevity: 48 mo
Brady Statistic RA Percent Paced: 7.9 %
HighPow Impedance: 55.125
Implantable Lead Implant Date: 20161005
Implantable Lead Location: 753859
Implantable Lead Location: 753860
Lead Channel Impedance Value: 537.5 Ohm
Lead Channel Pacing Threshold Amplitude: 2.25 V
Lead Channel Pacing Threshold Pulse Width: 0.5 ms
Lead Channel Pacing Threshold Pulse Width: 1 ms
Lead Channel Sensing Intrinsic Amplitude: 11.6 mV
Lead Channel Setting Pacing Amplitude: 1.75 V
Lead Channel Setting Pacing Amplitude: 2 V
Lead Channel Setting Pacing Pulse Width: 1 ms
Lead Channel Setting Sensing Sensitivity: 0.5 mV
MDC IDC LEAD IMPLANT DT: 20160616
MDC IDC LEAD IMPLANT DT: 20161005
MDC IDC LEAD LOCATION: 753858
MDC IDC LEAD MODEL: 7122
MDC IDC MSMT LEADCHNL LV IMPEDANCE VALUE: 400 Ohm
MDC IDC MSMT LEADCHNL LV PACING THRESHOLD AMPLITUDE: 2.5 V
MDC IDC MSMT LEADCHNL LV PACING THRESHOLD PULSEWIDTH: 1 ms
MDC IDC MSMT LEADCHNL RA IMPEDANCE VALUE: 450 Ohm
MDC IDC MSMT LEADCHNL RA PACING THRESHOLD AMPLITUDE: 0.75 V
MDC IDC MSMT LEADCHNL RA SENSING INTR AMPL: 1.4 mV
MDC IDC MSMT LEADCHNL RV PACING THRESHOLD AMPLITUDE: 0.625 V
MDC IDC MSMT LEADCHNL RV PACING THRESHOLD PULSEWIDTH: 0.5 ms
MDC IDC SESS DTM: 20161017163231
MDC IDC SET LEADCHNL LV PACING AMPLITUDE: 3.25 V
MDC IDC SET LEADCHNL RV PACING PULSEWIDTH: 0.5 ms
MDC IDC STAT BRADY RV PERCENT PACED: 97 %
Pulse Gen Serial Number: 7288352

## 2015-03-09 NOTE — Patient Outreach (Signed)
[facility]) Care Management   03/09/2015  Rossie Sicotte Grob Nov 16, 1945 [id number]  [name] is an 69 y.o. male  Subjective: Routine home visit with pt, HIPAA verified, pt reports he is to see Dr. Legrand Rams 03/16/15 and Dr. Domenic Polite 03/12/15.  Pt states " I might try to go to endocrinologist, I think I can improve my blood sugar"  Pt states "with this ICD, I'm afraid I'm going to get shocked".  Objective:   Filed Vitals:   03/09/15 1421  BP: 126/62  Pulse: 66  Resp: 16  Weight: 235 lb (106.595 kg)  SpO2: 98%  CBG ranges 56-215 2 hours after eating, pt did not check fasting. ROS  Physical Exam  Constitutional: He is oriented to person, place, and time. He appears well-developed and well-nourished.  HENT:  Head: Normocephalic.  Neck: Normal range of motion. Neck supple.  Cardiovascular: Normal rate and regular rhythm.   Pt has ICD and pacemaker  Respiratory: Effort normal and breath sounds normal.  GI: Soft. Bowel sounds are normal.  Musculoskeletal: Normal range of motion. He exhibits no edema.  Neurological: He is alert and oriented to person, place, and time.  Skin: Skin is warm and dry.  Psychiatric: He has a normal mood and affect. His behavior is normal. Judgment and thought content normal.    Current Medications:   Current Outpatient Prescriptions  Medication Sig Dispense Refill  . acetaminophen (TYLENOL) 500 MG tablet Take 1,000 mg by mouth 2 (two) times daily as needed for headache (pain).    Marland Kitchen allopurinol (ZYLOPRIM) 100 MG tablet Take 100 mg by mouth daily with supper.     Marland Kitchen amLODipine (NORVASC) 2.5 MG tablet Take 1 tablet (2.5 mg total) by mouth daily. 90 tablet 3  . aspirin EC 81 MG tablet Take 81 mg by mouth daily.    . carvedilol (COREG) 25 MG tablet Take 0.5 tablets (12.5 mg total) by mouth 2 (two) times daily with a meal. 30 tablet 6  . furosemide (LASIX) 40 MG tablet Take 40 mg twice a day, may take extra 40 mg daily for weight  gain over 3 lbs overnite (Patient taking differently: Take 40-80 mg by mouth 2 (two) times daily. Take 80 mg by mouth in the morning and take 40 mg by mouth in the evening.) 180 tablet 3  . gabapentin (NEURONTIN) 300 MG capsule Take 300 mg by mouth 2 (two) times daily.    Marland Kitchen glipiZIDE (GLUCOTROL XL) 10 MG 24 hr tablet Take 5 mg by mouth 2 (two) times daily.     . isosorbide mononitrate (IMDUR) 60 MG 24 hr tablet Take 2 tablets (120 mg total) by mouth every morning. & 30 mg in the evening (Patient taking differently: Take 30-120 mg by mouth 2 (two) times daily. Take 2 tablets (120 mg) every morning and 1/2 tablet (30 mg) with supper) 90 tablet 3  . linagliptin (TRADJENTA) 5 MG TABS tablet Take 5 mg by mouth daily.      . metFORMIN (GLUCOPHAGE) 500 MG tablet Take 500 mg by mouth 2 (two) times daily with a meal.     . NITROSTAT 0.4 MG SL tablet PLACE 1 TABLET UNDER THE TONGUE EVERY 5 MINUTES UP TO 3 DOSES AS NEEDED FOR CHEST PAIN. (Patient taking differently: PLACE 0.4 MG  UNDER THE TONGUE EVERY 5 MINUTES UP TO 3 DOSES AS NEEDED FOR CHEST PAIN.) 25 tablet 3  . potassium chloride SA (KLOR-CON M20) 20 MEQ tablet Take 1 tablet (20  mEq total) by mouth daily. 30 tablet 3  . simvastatin (ZOCOR) 20 MG tablet Take 1 tablet (20 mg total) by mouth at bedtime. 90 tablet 3  . traMADol (ULTRAM) 50 MG tablet Take 1 tablet (50 mg total) by mouth every 6 (six) hours as needed. 10 tablet 0   No current facility-administered medications for this visit.    Functional Status:   In your present state of health, do you have any difficulty performing the following activities: 02/25/2015 02/24/2015  Hearing? - N  Vision? - N  Difficulty concentrating or making decisions? - N  Walking or climbing stairs? - Y  Dressing or bathing? - N  Doing errands, shopping? N -  Preparing Food and eating ? - -  Using the Toilet? - -  In the past six months, have you accidently leaked urine? - -  Do you have problems with loss of bowel  control? - -  Managing your Medications? - -  Managing your Finances? - -  Housekeeping or managing your Housekeeping? - -    Fall/Depression Screening:    PHQ 2/9 Scores 02/16/2015  PHQ - 2 Score 0   Fall Risk  03/09/2015 02/16/2015  Falls in the past year? Yes Yes  Number falls in past yr: 1 1  Injury with Fall? No No  Risk for fall due to : Impaired balance/gait;Medication side effect Impaired balance/gait;Medication side effect  Follow up Falls evaluation completed;Education provided;Falls prevention discussed Falls evaluation completed;Education provided;Falls prevention discussed   Fall Risk  03/09/2015 02/16/2015  Falls in the past year? Yes Yes  Number falls in past yr: 1 1  Injury with Fall? No No  Risk for fall due to : Impaired balance/gait;Medication side effect Impaired balance/gait;Medication side effect  Follow up Falls evaluation completed;Education provided;Falls prevention discussed Falls evaluation completed;Education provided;Falls prevention discussed    Assessment:  RN CM reviewed EMMI education related to ICD including benefits, risks, what to expect and why an ICD is needed.  See Care plan- RN CM continues CHF and diabetic teaching including action plan,  reviewed carbohydrate counting.  Pt is going to check into getting a stationary exercise bicycle and will speak with cardiologist before beginning an exercise program, pt has completed outpatient cardiac rehab in the past and not interested in this option at present.  Pt inquired about Dr. Dorris Fetch endocrinologist and RN CM wrote out telephone number and address as pt states he may check into.  [facility] CM Care Plan Problem One        Most Recent Value   Care Plan Problem One  knowledge deficit related to CHF   Role Documenting the Problem One  Care Management Onycha for Problem One  Active   [facility] Long Term Goal (31-90 days)  pt will have no hospitalizations related to CHF within 90 days.   THN Long Term  Goal Start Date  02/16/15   Interventions for Problem One Long Term Goal  RN CM reiterated CHF zones/ action plan   [facility] CM Short Term Goal #1 (0-30 days)  pt will verbalize CHF zones within 30 days.   [facility] CM Short Term Goal #1 Start Date  03/09/15 [goal restarted-needs review]   Interventions for Short Term Goal #1  RN CM reinforced to call early for change in symptoms, health status.   [facility]N CM Short Term Goal #2 (0-30 days)  pt will weigh daily and record   [facility] CM Short Term Goal #2 Start Date  03/09/15 [restarted- weighing most days but not all]   Interventions for Short Term Goal #2  RN CM instructed pt to weigh daily at same time and keep log, reviewed why it is important to weigh daily.,    [facility] CM Care Plan Problem Two        Most Recent Value   Care Plan Problem Two  knowledge deficit related to diabetes   Role Documenting the Problem Two  Care Management Coordinator   Care Plan for Problem Two  Active   THN CM Short Term Goal #1 (0-30 days)  pt will check blood sugar daily and record   THN CM Short Term Goal #1 Start Date  03/09/15 [goal restarted-pt checking most days but not all]   Interventions for Short Term Goal #2   RN CM reiterated importance of checking CBG daily and recording and taking log to primary MD appointments      Plan: follow up with home visit 04/06/15 Assess [facility] calendar with CBG and weight log. Continue CHF, diabetes teaching.  Jacqlyn Larsen [facility] [place] Coordinator [phone]

## 2015-03-09 NOTE — Progress Notes (Signed)
Wound check appointment. Steri-strips removed. Wound without redness or edema. Incision edges approximated, wound well healed. Normal device function. RA/RV thresholds, sensing, and impedances consistent with implant measurements. Increase in LV threshold noted---now 3.0V @ 0.3ms(1.0V @ 0.67ms on 10/6), 2.25V @ 1.69ms---pulse width increased to 1.24ms---cxr ordered. Device programmed at appropriate safety margins. Histogram distribution appropriate for patient and level of activity. Abnormal thoracic impedance x 19 days---diuretics adjusted per primary card. No mode switch episodes. (1) ventricular arrhythmia noted x 12 sec @ 200bpm---polyVT---recent increase in diuretic---K+20 added per JA, labs (TSH/BMP/Mag) drawn. Patient educated about wound care, arm mobility, lifting restrictions, shock plan. ROV w/GT/R on 12/19 @ 0915.

## 2015-03-12 ENCOUNTER — Ambulatory Visit (INDEPENDENT_AMBULATORY_CARE_PROVIDER_SITE_OTHER): Payer: Medicare Other | Admitting: Cardiology

## 2015-03-12 ENCOUNTER — Encounter: Payer: Self-pay | Admitting: Cardiology

## 2015-03-12 VITALS — BP 132/68 | HR 65 | Ht 73.0 in | Wt 243.0 lb

## 2015-03-12 DIAGNOSIS — I5022 Chronic systolic (congestive) heart failure: Secondary | ICD-10-CM | POA: Diagnosis not present

## 2015-03-12 DIAGNOSIS — I10 Essential (primary) hypertension: Secondary | ICD-10-CM

## 2015-03-12 DIAGNOSIS — I25709 Atherosclerosis of coronary artery bypass graft(s), unspecified, with unspecified angina pectoris: Secondary | ICD-10-CM | POA: Diagnosis not present

## 2015-03-12 DIAGNOSIS — Z95810 Presence of automatic (implantable) cardiac defibrillator: Secondary | ICD-10-CM

## 2015-03-12 DIAGNOSIS — I255 Ischemic cardiomyopathy: Secondary | ICD-10-CM

## 2015-03-12 MED ORDER — NITROGLYCERIN 0.4 MG SL SUBL: SUBLINGUAL_TABLET | SUBLINGUAL | Status: DC

## 2015-03-12 MED ORDER — FUROSEMIDE 80 MG PO TABS: 80.0000 mg | ORAL_TABLET | Freq: Two times a day (BID) | ORAL | Status: DC

## 2015-03-12 NOTE — Progress Notes (Signed)
Cardiology Office Note  Date: 03/12/2015   ID: CAROLE GRANILLO, DOB 04-04-1946, MRN [id number]  PCP: Rosita Fire, MD  Primary Cardiologist: Rozann Lesches, MD   Chief Complaint  Patient presents with  . Hospitalization Follow-up  . Cardiomyopathy    History of Present Illness: [name] is a medically complex 69 y.o. male last seen in September at which time it was identified that he had developed malposition of his LV ICD lead resulting in thoracic muscle stimulation. He was referred to see Dr. Lovena Le and subsequently admitted to the hospital for placement of a new LV and RA lead on October 5. Subsequent device interrogation and wound check note reviewed.  He is here today for a follow-up visit. Still feels weak, but reports taking his regular medications. He has been using Lasix at 80 mg twice daily, recent creatinine was relatively stable at 1.6. Weight is still not back to baseline. We have discussed his diet and sodium restriction. He is also thinking about getting a stationary bicycle for exercise.  His most recent device interrogation did identify an episode of polymorphic VT, he has been instructed not to drive for 6 months already by the device clinic, I reinforced this recommendation today. Maintaining follow-up with Dr. Lovena Le.  Remaining cardiac medications remain stable. He is not reporting any angina symptoms, continues on Imdur. Does need a new bottle of nitroglycerin.  We have been holding off on repeating an echocardiogram to reassess LVEF due to interval setbacks, most recently his LV lead dislodgment.   Past Medical History  Diagnosis Date  . Ischemic cardiomyopathy     LVEF 45-50% 11/2011  . Coronary atherosclerosis of native coronary artery     Multivessel status post CABG  . Angioedema     Secondary to ACE inhibitor  . Bell palsy   . Type 2 diabetes mellitus ([place])   . AAA (abdominal aortic aneurysm) ([place])     Followed by Dr. Donnetta Hutching  .  Essential hypertension, benign   . PAD (peripheral artery disease) ([place])     Followed by Dr. Donnetta Hutching  . Carotid artery disease ([place])   . Atrial fibrillation ([place])   . Hernia of abdominal wall   . AICD (automatic cardioverter/defibrillator) present   . Nephrolithiasis   . Hypercholesteremia   . Myocardial infarction ([place])   . Migraines   . Arthritis   . Gout   . Sleep apnea     Past Surgical History  Procedure Laterality Date  . Dupuytren contracture release Left 2009  . Cataract extraction w/phaco  03/23/2011    Procedure: CATARACT EXTRACTION PHACO AND INTRAOCULAR LENS PLACEMENT (IOC);  Surgeon: Tonny Branch;  Location: [facility];  Service: Ophthalmology;  Laterality: Left;  CDE: 15.99  . Cataract extraction w/phaco  04/17/2011    Procedure: CATARACT EXTRACTION PHACO AND INTRAOCULAR LENS PLACEMENT (IOC);  Surgeon: Tonny Branch;  Location: [facility];  Service: Ophthalmology;  Laterality: Right;  CDE: 23.45  . Foot surgery Left 1963    "gangrene"  . Eye surgery    . Bypass graft popliteal to popliteal Left 06/08/2014    Procedure: BYPASS GRAFT FEMORAL ARTERY TO ABOVE KNEE POPLITEAL;  Surgeon: Rosetta Posner, MD;  Location: [facility];  Service: Vascular;  Laterality: Left;  . Abdominal aortic aneurysm repair  November 19, 2013    [facility] :  Dr. Sammuel Hines  . Abdominal aortic aneurysm repair  09-07-2014    [facility]  . Bi-ventricular implantable cardioverter defibrillator  (  crt-d)  11/05/2014  . Extracorporeal shock wave lithotripsy  X 1  . Cardiac catheterization N/A 10/02/2014    Procedure: Left Heart Cath and Cors/Grafts Angiography;  Surgeon: Belva Crome, MD;  Location: [place] CV [facility];  Service: Cardiovascular;  Laterality: N/A;  . Cardiac catheterization  "several"  . Coronary artery bypass graft  2001    LIMA to LAD, SVG to diagonal and ramus, SVG to OM, SVG to AM  . Ep implantable device N/A 11/05/2014    Procedure: BiV ICD Insertion CRT-D;  Surgeon: Evans Lance, MD;  Location: [place] CV [facility];  Service: Cardiovascular;  Laterality: N/A;  . Icd lead removal  02/24/2015  . Ep implantable device N/A 02/24/2015    Procedure: Lead Revision/Repair;  Surgeon: Evans Lance, MD;  Location: [place] CV [facility];  Service: Cardiovascular;  Laterality: N/A;    Current Outpatient Prescriptions  Medication Sig Dispense Refill  . isosorbide mononitrate (IMDUR) 60 MG 24 hr tablet Take 60 mg by mouth as directed. 120 mg am, 30 mg pm    . acetaminophen (TYLENOL) 500 MG tablet Take 1,000 mg by mouth 2 (two) times daily as needed for headache (pain).    Marland Kitchen allopurinol (ZYLOPRIM) 100 MG tablet Take 100 mg by mouth daily with supper.     Marland Kitchen amLODipine (NORVASC) 2.5 MG tablet Take 1 tablet (2.5 mg total) by mouth daily. 90 tablet 3  . aspirin EC 81 MG tablet Take 81 mg by mouth daily.    . carvedilol (COREG) 25 MG tablet Take 0.5 tablets (12.5 mg total) by mouth 2 (two) times daily with a meal. 30 tablet 6  . furosemide (LASIX) 80 MG tablet Take 1 tablet (80 mg total) by mouth 2 (two) times daily. 180 tablet 3  . gabapentin (NEURONTIN) 300 MG capsule Take 300 mg by mouth 2 (two) times daily.    Marland Kitchen glipiZIDE (GLUCOTROL XL) 10 MG 24 hr tablet Take 5 mg by mouth 2 (two) times daily.     Marland Kitchen linagliptin (TRADJENTA) 5 MG TABS tablet Take 5 mg by mouth daily.      . metFORMIN (GLUCOPHAGE) 500 MG tablet Take 500 mg by mouth 2 (two) times daily with a meal.     . nitroGLYCERIN (NITROSTAT) 0.4 MG SL tablet PLACE 0.4 MG  UNDER THE TONGUE EVERY 5 MINUTES UP TO 3 DOSES AS NEEDED FOR CHEST PAIN. 25 tablet 3  . potassium chloride SA (KLOR-CON M20) 20 MEQ tablet Take 1 tablet (20 mEq total) by mouth daily. 30 tablet 3  . simvastatin (ZOCOR) 20 MG tablet Take 1 tablet (20 mg total) by mouth at bedtime. 90 tablet 3  . traMADol (ULTRAM) 50 MG tablet Take 1 tablet (50 mg total) by mouth every 6 (six) hours as needed. 10 tablet 0   No current facility-administered medications for this visit.    Allergies:  Ace  inhibitors; Codeine; Lisinopril; and Hydromorphone   Social History: The patient  reports that he quit smoking about 7 years ago. His smoking use included Cigarettes. He started smoking about 36 years ago. He has a 25 pack-year smoking history. He has never used smokeless tobacco. He reports that he does not drink alcohol or use illicit drugs.   ROS:  Please see the history of present illness. Otherwise, complete review of systems is positive for fatigue, intermittent shortness of breath over baseline NYHA class 2-3 dyspnea.  All other systems are reviewed and negative.   Physical Exam: VS:  BP 132/68 mmHg  Pulse 65  Ht 6\' 1"  (1.854 m)  Wt 243 lb (110.224 kg)  BMI 32.07 kg/m2  SpO2 95%, BMI Body mass index is 32.07 kg/(m^2).  Wt Readings from Last 3 Encounters:  03/12/15 243 lb (110.224 kg)  03/09/15 235 lb (106.595 kg)  02/25/15 231 lb 11.3 oz (105.1 kg)     General: Obese male, appears comfortable at rest. HEENT: Conjunctiva and lids normal, oropharynx clear. Neck: Supple, elevated JVP, no carotid bruits, no thyromegaly. Thorax: Well-healing device pocket site on left, no erythema or tenderness. Lungs: Decreased breath sounds, nonlabored breathing at rest. Cardiac: Indistinct PMI, regular rate and rhythm, no S3, no pericardial rub. Abdomen: Protuberant, nontender, bowel sounds present. Extremities: Trace edema, distal pulses 2+. Skin: Warm and dry. Musculoskeletal: No kyphosis. Neuropsychiatric: Alert and oriented x3, affect grossly appropriate.   ECG: ECG is not ordered today.  Recent Labwork: 09/30/2014: ALT 14*; AST 18 02/19/2015: Hemoglobin 12.9*; Platelets 327 03/08/2015: BUN 22; Creat 1.68*; Magnesium 2.2; Potassium 4.0; Sodium 143; TSH 3.364     Component Value Date/Time   CHOL 118 10/16/2014 0715   TRIG 100 10/16/2014 0715   HDL 33* 10/16/2014 0715   CHOLHDL 3.6 10/16/2014 0715   VLDL 20 10/16/2014 0715   LDLCALC 65 10/16/2014 0715    Other Studies Reviewed  Today:  Cardiac catheterization 10/02/2014:  Bypass graft failure with total occlusion of the saphenous vein graft to the acute marginal of the right coronary, total occlusion of the sequential saphenous vein graft to the diagonal and ramus intermedius, and total occlusion of the saphenous graft to the distal circumflex/obtuse marginal.  Patent LIMA to the LAD.  Total occlusion of the native LAD, the mid right coronary, second obtuse marginal, the first diagonal, and the ramus intermedius.  The distal right coronary, the obtuse marginal, ramus intermedius are collateralized from the native circulation via the LIMA to LAD.  Severe left ventricular systolic dysfunction with an inferobasal aneurysm, and an ejection fraction of 20-25%.  RECOMMENDATIONS:   Medical therapy for heart failure including consideration of device therapy to prolong survival.  Medical therapy for chronic ischemia as tolerated by blood pressure.  With the patent LIMA to LAD and severe LV systolic dysfunction, repeat surgical would be high risk and does not seem prudent.   ASSESSMENT AND PLAN:  1. Ischemic cardiomyopathy with chronic systolic heart failure. Plan to continue current medical treatment, maintaining Lasix at 80 mg twice daily with potassium supplements. We have discussed sodium restriction, basic exercise plan. He will follow-up in one month with a BMET.  2. Biventricular ICD with recent LV lead dislodgment, now status post revision with placement of new RA and LV lead per Dr. Lovena Le.  3. CAD status post CABG with graft disease. Recent cardiac catheterization from earlier in the year is outlined above. Plan is to continue medical therapy.  4. Essential hypertension, no change to current regimen.  Current medicines were reviewed at length with the patient today.   Orders Placed This Encounter  Procedures  . Basic Metabolic Panel (BMET)    Disposition: FU with me in 1  month.   Signed, Satira Sark, MD, [facility] 03/12/2015 9:47 AM    [facility] [place]. [address], [place], [place] [address] Phone: [phone]; Fax: [phone]

## 2015-03-12 NOTE — Patient Instructions (Signed)
Your physician recommends that you schedule a follow-up appointment in: 1 month with Dr Domenic Polite    INCREASE Lasix to 80 mg twice a day     PLEASE have lab drawn a few days BEFORE NEXT VISIT   If you need a refill on your cardiac medications before your next appointment, please call your pharmacy.  I refilled your nitroglycerine   Thank you for choosing [facility] !

## 2015-03-15 ENCOUNTER — Other Ambulatory Visit: Payer: Self-pay | Admitting: *Deleted

## 2015-03-15 NOTE — Patient Outreach (Signed)
03/15/15- Telephone call to patient for transition of care week 4, no answer to telephone, no option to leave voicemail.   PLAN RN CM to see pt for scheduled home visit 04/06/15  Jacqlyn Larsen [facility], [place] Coordinator [phone]

## 2015-03-16 ENCOUNTER — Encounter: Payer: Self-pay | Admitting: Internal Medicine

## 2015-03-18 NOTE — Telephone Encounter (Signed)
Prescription WAS called in at new dose on 10/21 to [facility] and confirmed by fax at 945 am,lm telling pt that

## 2015-04-01 DIAGNOSIS — M47819 Spondylosis without myelopathy or radiculopathy, site unspecified: Secondary | ICD-10-CM | POA: Diagnosis not present

## 2015-04-01 DIAGNOSIS — K439 Ventral hernia without obstruction or gangrene: Secondary | ICD-10-CM | POA: Diagnosis not present

## 2015-04-01 DIAGNOSIS — N200 Calculus of kidney: Secondary | ICD-10-CM | POA: Diagnosis not present

## 2015-04-01 DIAGNOSIS — I716 Thoracoabdominal aortic aneurysm, without rupture: Secondary | ICD-10-CM | POA: Diagnosis not present

## 2015-04-01 DIAGNOSIS — K5730 Diverticulosis of large intestine without perforation or abscess without bleeding: Secondary | ICD-10-CM | POA: Diagnosis not present

## 2015-04-01 DIAGNOSIS — I714 Abdominal aortic aneurysm, without rupture: Secondary | ICD-10-CM | POA: Diagnosis not present

## 2015-04-01 DIAGNOSIS — Z951 Presence of aortocoronary bypass graft: Secondary | ICD-10-CM | POA: Diagnosis not present

## 2015-04-01 DIAGNOSIS — N281 Cyst of kidney, acquired: Secondary | ICD-10-CM | POA: Diagnosis not present

## 2015-04-01 DIAGNOSIS — Z95810 Presence of automatic (implantable) cardiac defibrillator: Secondary | ICD-10-CM | POA: Diagnosis not present

## 2015-04-06 ENCOUNTER — Encounter: Payer: Self-pay | Admitting: *Deleted

## 2015-04-06 ENCOUNTER — Encounter: Payer: Self-pay | Admitting: Family

## 2015-04-06 ENCOUNTER — Other Ambulatory Visit: Payer: Self-pay | Admitting: *Deleted

## 2015-04-06 NOTE — Patient Outreach (Signed)
[facility]) Care Management   04/06/2015  [name] 09-Jan-1946 [id number]  [name] is an 69 y.o. male  Subjective: Routine home visit with patient, HIPAA verified, pt states he missed his recent appointment with primary care MD Dr. Legrand Rams 03/24/15 and will be missing appointment with Dr. Donnetta Hutching (vascular) on 04/08/15 "because I can't drive for 6 months due to my heart issues".  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  Has 3 children that assist but limited assistance as the adult children work.  Pt states " blood sugar has been running high some days"  Objective: Filed Vitals:   04/06/15 1203  BP: 126/60  Pulse: 75  Resp: 16  Weight: 239 lb (108.41 kg)  SpO2: 96%  CBG today random 2 hours after eating 261 Fasting ranges 71-227 Random ranges 201-265 Weight ranges 233-239 pounds ROS  Physical Exam  Constitutional: He is oriented to person, place, and time. He appears well-developed and well-nourished.  HENT:  Head: Normocephalic.  Neck: Normal range of motion. Neck supple.  Cardiovascular: Normal rate and regular rhythm.   Respiratory: Effort normal and breath sounds normal.  Dyspnea with exertion.  GI: Soft. Bowel sounds are normal.  Musculoskeletal: Normal range of motion. He exhibits no edema.  Neurological: He is alert and oriented to person, place, and time.  Skin: Skin is warm and dry.  Psychiatric: He has a normal mood and affect. His behavior is normal. Judgment and thought content normal.    Current Medications:   Current Outpatient Prescriptions  Medication Sig Dispense Refill  . acetaminophen (TYLENOL) 500 MG tablet Take 1,000 mg by mouth 2 (two) times daily as needed for headache (pain).    Marland Kitchen allopurinol (ZYLOPRIM) 100 MG tablet Take 100 mg by mouth daily with supper.     Marland Kitchen amLODipine (NORVASC) 2.5 MG tablet Take 1 tablet (2.5 mg total) by mouth daily. 90 tablet 3  . aspirin EC 81 MG tablet Take 81 mg by mouth  daily.    . carvedilol (COREG) 25 MG tablet Take 0.5 tablets (12.5 mg total) by mouth 2 (two) times daily with a meal. 30 tablet 6  . furosemide (LASIX) 80 MG tablet Take 1 tablet (80 mg total) by mouth 2 (two) times daily. 180 tablet 3  . gabapentin (NEURONTIN) 300 MG capsule Take 300 mg by mouth 2 (two) times daily.    Marland Kitchen glipiZIDE (GLUCOTROL XL) 10 MG 24 hr tablet Take 5 mg by mouth 2 (two) times daily.     . isosorbide mononitrate (IMDUR) 60 MG 24 hr tablet Take 60 mg by mouth as directed. 120 mg am, 30 mg pm    . linagliptin (TRADJENTA) 5 MG TABS tablet Take 5 mg by mouth daily.      . metFORMIN (GLUCOPHAGE) 500 MG tablet Take 500 mg by mouth 2 (two) times daily with a meal.     . nitroGLYCERIN (NITROSTAT) 0.4 MG SL tablet PLACE 0.4 MG  UNDER THE TONGUE EVERY 5 MINUTES UP TO 3 DOSES AS NEEDED FOR CHEST PAIN. 25 tablet 3  . potassium chloride SA (KLOR-CON M20) 20 MEQ tablet Take 1 tablet (20 mEq total) by mouth daily. 30 tablet 3  . simvastatin (ZOCOR) 20 MG tablet Take 1 tablet (20 mg total) by mouth at bedtime. 90 tablet 3  . traMADol (ULTRAM) 50 MG tablet Take 1 tablet (50 mg total) by mouth every 6 (six) hours as needed. 10 tablet 0  No current facility-administered medications for this visit.    Functional Status:   In your present state of health, do you have any difficulty performing the following activities: 02/25/2015 02/24/2015  Hearing? - N  Vision? - N  Difficulty concentrating or making decisions? - N  Walking or climbing stairs? - Y  Dressing or bathing? - N  Doing errands, shopping? N -  Preparing Food and eating ? - -  Using the Toilet? - -  In the past six months, have you accidently leaked urine? - -  Do you have problems with loss of bowel control? - -  Managing your Medications? - -  Managing your Finances? - -  Housekeeping or managing your Housekeeping? - -    Fall/Depression Screening:    PHQ 2/9 Scores 02/16/2015  PHQ - 2 Score 0    Assessment:  RN CM  called [facility] at patient's home and informed that pt can ride [place] for $6.00 round trip to in county appointments which would include primary MD, cardiologist Dr. Domenic Polite,  [place] will reschedule his primary care appointments, then write out all appointments and call [facility] back with at least 3 business days notice and schedule to ride the [place].  Pt states he will ask his children to assist with the out of county/ [facility] appointments with Dr. Donnetta Hutching and Dr. Lovena Le, pt does not feel he needs social work as he knows how to contact [facility] himself.  RN CM called [facility] CSW Theadore Nan and left voicemail requesting return phone call to discuss out of county transportation needs/ costs, suggestions. Pt has not made appointment with endocrinologist Dr. Dorris Fetch yet but says he plans to as he feels his blood sugar is not where it should be and feels he may need to be placed on insulin.  RN CM offered to assist with appointments and pt says he is able to make his appointments. RN CM faxed note to Dr. Legrand Rams and reported CBG readings, fasting and random ranges, reported pt is going to reschedule his MD visit.  [facility] CM Care Plan Problem One        Most Recent Value   Care Plan Problem One  knowledge deficit related to CHF   Role Documenting the Problem One  Care Management Dennis for Problem One  Active   [facility] Long Term Goal (31-90 days)  pt will have no hospitalizations related to CHF within 90 days.   [facility] Long Term Goal Start Date  02/16/15   Interventions for Problem One Long Term Goal  RN CM reinforced CHF zones/ action plan   [facility] CM Short Term Goal #1 (0-30 days)  pt will verbalize CHF zones within 30 days.   [facility] CM Short Term Goal #1 Start Date  03/09/15 [goal restarted-needs review]   [facility] CM Short Term Goal #1 Met Date  04/06/15   Interventions for Short Term Goal #1  RN CM reviewed to call early for change in symptoms, health status.   [facility] CM Short Term Goal #2 (0-30 days)  pt will weigh daily and  record   [facility] CM Short Term Goal #2 Start Date  03/09/15 [restarted- weighing most days but not all]   [facility] CM Short Term Goal #2 Met Date  04/06/15 [pt weighing daily and recording]   Interventions for Short Term Goal #2  RN CM reviewed [facility] calendar, weight log    [facility] CM Care Plan Problem Two  Most Recent Value   Care Plan Problem Two  knowledge deficit related to diabetes   Role Documenting the Problem Two  Care Management Coordinator   Care Plan for Problem Two  Active   [facility] CM Short Term Goal #1 (0-30 days)  pt will check blood sugar daily and record   [facility] CM Short Term Goal #1 Start Date  03/09/15 [goal restarted-pt checking most days but not all]   [facility] CM Short Term Goal #1 Met Date   04/06/15 [pt checking CBG daily and logging]    [facility] CM Care Plan Problem Three        Most Recent Value   Care Plan Problem Three  pt has decreased endurance   Role Documenting the Problem Three  Care Management Coordinator   Care Plan for Problem Three  Active   [facility] CM Short Term Goal #1 (0-30 days)  pt will increase physical activity and walk daily   [facility] CM Short Term Goal #1 Start Date  04/06/15   Interventions for Short Term Goal #1  RN CM talked with pt about getting stationary bike (as pt mentioned in the past that he wants to do this), RN CM encouraged pt to walk for several minutes each day and slow add to this to build endurance.      Plan: Follow up with home visit on 05/04/15 Assess weight and CBG log Follow up on transportation issues/ appointments Ask about exercise, endurance  Jacqlyn Larsen [facility], [place] Coordinator [phone]

## 2015-04-08 ENCOUNTER — Ambulatory Visit (INDEPENDENT_AMBULATORY_CARE_PROVIDER_SITE_OTHER)
Admission: RE | Admit: 2015-04-08 | Discharge: 2015-04-08 | Disposition: A | Discharge status: Home / Self Care | Payer: Medicare Other | Source: Ambulatory Visit | Attending: Vascular Surgery | Admitting: Vascular Surgery

## 2015-04-08 ENCOUNTER — Ambulatory Visit (HOSPITAL_COMMUNITY)
Admission: RE | Admit: 2015-04-08 | Discharge: 2015-04-08 | Disposition: A | Discharge status: Home / Self Care | Payer: Medicare Other | Source: Ambulatory Visit | Attending: Family | Admitting: Family

## 2015-04-08 ENCOUNTER — Ambulatory Visit (INDEPENDENT_AMBULATORY_CARE_PROVIDER_SITE_OTHER): Payer: Medicare Other | Admitting: Family

## 2015-04-08 ENCOUNTER — Other Ambulatory Visit: Payer: Self-pay | Admitting: Vascular Surgery

## 2015-04-08 ENCOUNTER — Other Ambulatory Visit: Payer: Self-pay

## 2015-04-08 ENCOUNTER — Encounter: Payer: Self-pay | Admitting: Family

## 2015-04-08 VITALS — BP 138/68 | HR 67 | Temp 97.2°F | Resp 16 | Ht 73.0 in | Wt 238.0 lb

## 2015-04-08 DIAGNOSIS — I724 Aneurysm of artery of lower extremity: Secondary | ICD-10-CM

## 2015-04-08 DIAGNOSIS — Z48812 Encounter for surgical aftercare following surgery on the circulatory system: Secondary | ICD-10-CM

## 2015-04-08 DIAGNOSIS — I255 Ischemic cardiomyopathy: Secondary | ICD-10-CM | POA: Diagnosis not present

## 2015-04-08 DIAGNOSIS — I739 Peripheral vascular disease, unspecified: Secondary | ICD-10-CM

## 2015-04-08 DIAGNOSIS — Z95828 Presence of other vascular implants and grafts: Secondary | ICD-10-CM

## 2015-04-08 DIAGNOSIS — T82858A Stenosis of vascular prosthetic devices, implants and grafts, initial encounter: Secondary | ICD-10-CM | POA: Diagnosis not present

## 2015-04-08 NOTE — Progress Notes (Signed)
[facility] [name] HISTORY AND PHYSICAL -Femoral artery aneurysm repair  History of Present Illness [name] is a 69 y.o. male patient of Dr. 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   He reports that he was told by [facility] that he has a blockage in his right leg; was told that since he had arteries removed from both his legs for his CABG, that revascularization surgery should not be attempted.  He has bilateral buttocks pain with walking since about 2014, is worsening, relieved with rest.  He had a AAA repair at [facility] in 2015 and revision in April of 2016. After walking about 150 feet, both legs hurt equally from hips to calves. He had a AICD/pacemaker inserted in June 2016. Pt states his left great toe was crushed at age 73, developed gangrene, required amputation.  Pt Diabetic: Yes, not in good control lately, previously had an A1C of 5.? Pt smoker: former smoker, quit in 1997, started in his late 20's  Pt meds include: Statin :Yes Betablocker: Yes ASA: Yes Other anticoagulants/antiplatelets: no  Past Medical History  Diagnosis Date  . Ischemic cardiomyopathy     LVEF 45-50% 11/2011  . Coronary atherosclerosis of native coronary artery     Multivessel status post CABG  . Angioedema     Secondary to ACE inhibitor  . Bell palsy   . Type 2 diabetes mellitus ([place])   . AAA (abdominal aortic aneurysm) ([place])     Followed by Dr. Donnetta Hutching  . Essential hypertension, benign   . PAD (peripheral artery disease) ([place])     Followed by Dr. Donnetta Hutching  . Carotid artery disease ([place])   . Atrial fibrillation ([place])   . Hernia of abdominal wall   . AICD (automatic cardioverter/defibrillator) present   . Nephrolithiasis   . Hypercholesteremia   . Myocardial infarction ([place])    . Migraines   . Arthritis   . Gout   . Sleep apnea     Social History Social History  Substance Use Topics  . Smoking status: Former Smoker -- 1.00 packs/day for 25 years    Types: Cigarettes    Start date: 02/28/1979    Quit date: 05/23/2007  . Smokeless tobacco: Never Used  . Alcohol Use: No    Family History Family History  Problem Relation Age of Onset  . Diabetes Mother     Type  I   . Varicose Veins Mother   . Heart disease Father 47    AAA  . AAA (abdominal aortic aneurysm) Father   . Hyperlipidemia Father   . Hypertension Father     Past Surgical History  Procedure Laterality Date  . Dupuytren contracture release Left 2009  . Cataract extraction w/phaco  03/23/2011    Procedure: CATARACT EXTRACTION PHACO AND INTRAOCULAR LENS PLACEMENT (IOC);  Surgeon: Tonny Branch;  Location: [facility];  Service: Ophthalmology;  Laterality: Left;  CDE: 15.99  . Cataract extraction w/phaco  04/17/2011    Procedure: CATARACT EXTRACTION PHACO AND INTRAOCULAR LENS PLACEMENT (IOC);  Surgeon: Tonny Branch;  Location: [facility];  Service: Ophthalmology;  Laterality: Right;  CDE: 23.45  . Foot surgery Left 1963    "gangrene"  . Eye surgery    . Bypass graft popliteal to popliteal Left 06/08/2014    Procedure: BYPASS GRAFT FEMORAL ARTERY TO ABOVE KNEE POPLITEAL;  Surgeon: Rosetta Posner, MD;  Location: [place];  Service: Vascular;  Laterality: Left;  . Abdominal aortic aneurysm repair  November 19, 2013    [facility] :  Dr. Sammuel Hines  . Abdominal aortic aneurysm repair  09-07-2014    [facility]  . Bi-ventricular implantable cardioverter defibrillator  (crt-d)  11/05/2014  . Extracorporeal shock wave lithotripsy  X 1  . Cardiac catheterization N/A 10/02/2014    Procedure: Left Heart Cath and Cors/Grafts Angiography;  Surgeon: Belva Crome, MD;  Location: [place] [facility];  Service: Cardiovascular;  Laterality: N/A;  . Cardiac catheterization  "several"  . Coronary artery bypass graft  2001     LIMA to LAD, SVG to diagonal and ramus, SVG to OM, SVG to AM  . Ep implantable device N/A 11/05/2014    Procedure: BiV ICD Insertion CRT-D;  Surgeon: Evans Lance, MD;  Location: [place] [facility];  Service: Cardiovascular;  Laterality: N/A;  . Icd lead removal  02/24/2015  . Ep implantable device N/A 02/24/2015    Procedure: Lead Revision/Repair;  Surgeon: Evans Lance, MD;  Location: [place] [facility];  Service: Cardiovascular;  Laterality: N/A;    Allergies  Allergen Reactions  . Ace Inhibitors Anaphylaxis  . Codeine Other (See Comments)     delirium  . Lisinopril Anaphylaxis and Swelling  . Hydromorphone Nausea And Vomiting    Current Outpatient Prescriptions  Medication Sig Dispense Refill  . acetaminophen (TYLENOL) 500 MG tablet Take 1,000 mg by mouth 2 (two) times daily as needed for headache (pain).    Marland Kitchen allopurinol (ZYLOPRIM) 100 MG tablet Take 100 mg by mouth daily with supper.     Marland Kitchen amLODipine (NORVASC) 2.5 MG tablet Take 1 tablet (2.5 mg total) by mouth daily. 90 tablet 3  . aspirin EC 81 MG tablet Take 81 mg by mouth daily.    . carvedilol (COREG) 25 MG tablet Take 0.5 tablets (12.5 mg total) by mouth 2 (two) times daily with a meal. 30 tablet 6  . furosemide (LASIX) 80 MG tablet Take 1 tablet (80 mg total) by mouth 2 (two) times daily. 180 tablet 3  . gabapentin (NEURONTIN) 300 MG capsule Take 300 mg by mouth 2 (two) times daily.    Marland Kitchen glipiZIDE (GLUCOTROL XL) 10 MG 24 hr tablet Take 5 mg by mouth 2 (two) times daily.     . isosorbide mononitrate (IMDUR) 60 MG 24 hr tablet Take 60 mg by mouth as directed. 120 mg am, 30 mg pm    . linagliptin (TRADJENTA) 5 MG TABS tablet Take 5 mg by mouth daily.      . metFORMIN (GLUCOPHAGE) 500 MG tablet Take 500 mg by mouth 2 (two) times daily with a meal.     . nitroGLYCERIN (NITROSTAT) 0.4 MG SL tablet PLACE 0.4 MG  UNDER THE TONGUE EVERY 5 MINUTES UP TO 3 DOSES AS NEEDED FOR CHEST PAIN. 25 tablet 3  . potassium chloride SA  (KLOR-CON M20) 20 MEQ tablet Take 1 tablet (20 mEq total) by mouth daily. 30 tablet 3  . simvastatin (ZOCOR) 20 MG tablet Take 1 tablet (20 mg total) by mouth at bedtime. 90 tablet 3  . traMADol (ULTRAM) 50 MG tablet Take 1 tablet (50 mg total) by mouth every 6 (six) hours as needed. 10 tablet 0   No current facility-administered medications for this visit.    ROS: See HPI for pertinent positives and negatives.   Physical Examination  Filed Vitals:   04/08/15 1510  BP: 138/68  Pulse: 67  Temp: 97.2 F (36.2 C)  TempSrc: Oral  Resp: 16  Height: 6\' 1"  (1.854 m)  Weight: 238 lb (107.956 kg)  SpO2: 96%   Body mass index is 31.41 kg/(m^2).  General: A&O x 3, WDWN, obese male. Gait: normal Eyes: PERRLA. Pulmonary: CTAB, limited air movemant in all fields, no wheezes , rales or rhonchi, occasional cough. Cardiac: regular rhythm, occasional pauses, no detected murmur.  AICD palpated subcutaneously at left upper chest.     Carotid Bruits Right Left   Negative Negative  Aorta is not palpable. Radial pulses: 2+ palpable and =                           VASCULAR EXAM: Extremities without ischemic changes, without Gangrene; without open wounds. Left great toe is surgically absent.                                                                                                          LE Pulses Right Left       FEMORAL  1+ palpable  2+ palpable        POPLITEAL  not palpable   not palpable       POSTERIOR TIBIAL  not palpable   2+ palpable        DORSALIS PEDIS      ANTERIOR TIBIAL not palpable  2+ palpable    Abdomen: softly obese, NT, large reducible umbilical hernia. Skin: no rashes, no ulcers. Musculoskeletal: no muscle wasting or atrophy.  Neurologic: A&O X 3; Appropriate Affect, MOTOR FUNCTION:  moving all extremities equally, motor strength 5/5 throughout. Speech is fluent/normal.  CN 2-12 intact.    Non-Invasive Vascular Imaging: DATE: 04/08/2015 LOWER  EXTREMITY ARTERIAL DUPLEX EVALUATION    INDICATION: Follow-up left lower extremity bypass graft     PREVIOUS INTERVENTION(S): Left femoropopliteal arterial bypass graft for aneurysm repair 06/08/2014    DUPLEX EXAM:     RIGHT  LEFT   Peak Systolic Velocity (cm/s) Ratio (if abnormal) Waveform  Peak Systolic Velocity (cm/s) Ratio (if abnormal) Waveform     Inflow Artery 225  Turbulent     Proximal Anastomosis 77  B     Proximal Graft 50  B     Mid Graft 61  B      Distal Graft 69  B     Distal Anastomosis 518 7.5 Stenotic     Outflow Artery 80  B/ turbulent  0.53 Today's ABI / TBI 0.71  0.62 Previous ABI / TBI (  ) 0.97    Waveform:    M - Monophasic       B - Biphasic       T - Triphasic  If Ankle Brachial Index (ABI) or Toe Brachial Index (TBI) performed, please see complete report  ADDITIONAL FINDINGS:     IMPRESSION: 1. Patent graft with evidence of >70%% stenosis of the distal anastomosis. 2. Suspect inflow disease due to elevated  velocities and turbulence in the common femoral artery; area is not well visualized due to shadow and scarring.    Compared to the previous exam:  Velocities have significantly increased compared to previous exam.     ASSESSMENT: SEM SORRELLS is a 69 y.o. 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  May 2016 ABI's were 0.62 in the right LE and 0.97 in the left LE. Today's left LE arterial duplex suggests a patent graft with evidence of >70%% stenosis of the distal anastomosis (518 cm/s) compared to 244 cm/s at the same location on 09/29/14. Suspect inflow disease due to elevated velocities and turbulence in the common femoral artery; area is not well visualized due to shadow and scarring.   PLAN:  Based on the patient's vascular studies and examination, and after discussing with Dr. Oneida Alar, pt  will be scheduled for arteriogram with bilateral run-off to lower extremities, possible intervention; indication is  >70%% stenosis of the distal anastomosis of the left leg bypass graft (518 cm/s) compared to 244 cm/s at the same location on 09/29/14.  I discussed in depth with the patient the nature of atherosclerosis, and emphasized the importance of maximal medical management including strict control of blood pressure, blood glucose, and lipid levels, obtaining regular exercise, and continued cessation of smoking.  The patient is aware that without maximal medical management the underlying atherosclerotic disease process will progress, limiting the benefit of any interventions.  The patient was given information about PAD including signs, symptoms, treatment, what symptoms should prompt the patient to seek immediate medical care, and risk reduction measures to take.  Clemon Chambers, RN, MSN, FNP-C [facility] [employer] Phone: [phone]  Clinic MD: Oneida Alar  04/08/2015 2:00 PM

## 2015-04-08 NOTE — Patient Instructions (Signed)
Peripheral Vascular Disease Peripheral vascular disease (PVD) is a disease of the blood vessels that are not part of your heart and brain. A simple term for PVD is poor circulation. In most cases, PVD narrows the blood vessels that carry blood from your heart to the rest of your body. This can result in a decreased supply of blood to your arms, legs, and internal organs, like your stomach or kidneys. However, it most often affects a person's lower legs and feet. There are two types of PVD.  Organic PVD. This is the more common type. It is caused by damage to the structure of blood vessels.  Functional PVD. This is caused by conditions that make blood vessels contract and tighten (spasm). Without treatment, PVD tends to get worse over time. PVD can also lead to acute ischemic limb. This is when an arm or limb suddenly has trouble getting enough blood. This is a medical emergency. CAUSES Each type of PVD has many different causes. The most common cause of PVD is buildup of a fatty material (plaque) inside of your arteries (atherosclerosis). Small amounts of plaque can break off from the walls of the blood vessels and become lodged in a smaller artery. This blocks blood flow and can cause acute ischemic limb. Other common causes of PVD include:  Blood clots that form inside of blood vessels.  Injuries to blood vessels.  Diseases that cause inflammation of blood vessels or cause blood vessel spasms.  Health behaviors and health history that increase your risk of developing PVD. RISK FACTORS  You may have a greater risk of PVD if you:  Have a family history of PVD.  Have certain medical conditions, including:  High cholesterol.  Diabetes.  High blood pressure (hypertension).  Coronary heart disease.  Past problems with blood clots.  Past injury, such as burns or a broken bone. These may have damaged blood vessels in your limbs.  Buerger disease. This is caused by inflamed blood  vessels in your hands and feet.  Some forms of arthritis.  Rare birth defects that affect the arteries in your legs.  Use tobacco.  Do not get enough exercise.  Are obese.  Are age 50 or older. SIGNS AND SYMPTOMS  PVD may cause many different symptoms. Your symptoms depend on what part of your body is not getting enough blood. Some common signs and symptoms include:  Cramps in your lower legs. This may be a symptom of poor leg circulation (claudication).  Pain and weakness in your legs while you are physically active that goes away when you rest (intermittent claudication).  Leg pain when at rest.  Leg numbness, tingling, or weakness.  Coldness in a leg or foot, especially when compared with the other leg.  Skin or hair changes. These can include:  Hair loss.  Shiny skin.  Pale or bluish skin.  Thick toenails.  Inability to get or maintain an erection (erectile dysfunction). People with PVD are more prone to developing ulcers and sores on their toes, feet, or legs. These may take longer than normal to heal. DIAGNOSIS Your health care provider may diagnose PVD from your signs and symptoms. The health care provider will also do a physical exam. You may have tests to find out what is causing your PVD and determine its severity. Tests may include:  Blood pressure recordings from your arms and legs and measurements of the strength of your pulses (pulse volume recordings).  Imaging studies using sound waves to take pictures of   the blood flow through your blood vessels (Doppler ultrasound).  Injecting a dye into your blood vessels before having imaging studies using:  X-rays (angiogram or arteriogram).  Computer-generated X-rays (CT angiogram).  A powerful electromagnetic field and a computer (magnetic resonance angiogram or MRA). TREATMENT Treatment for PVD depends on the cause of your condition and the severity of your symptoms. It also depends on your age. Underlying  causes need to be treated and controlled. These include long-lasting (chronic) conditions, such as diabetes, high cholesterol, and high blood pressure. You may need to first try making lifestyle changes and taking medicines. Surgery may be needed if these do not work. Lifestyle changes may include:  Quitting smoking.  Exercising regularly.  Following a low-fat, low-cholesterol diet. Medicines may include:  Blood thinners to prevent blood clots.  Medicines to improve blood flow.  Medicines to improve your blood cholesterol levels. Surgical procedures may include:  A procedure that uses an inflated balloon to open a blocked artery and improve blood flow (angioplasty).  A procedure to put in a tube (stent) to keep a blocked artery open (stent implant).  Surgery to reroute blood flow around a blocked artery (peripheral bypass surgery).  Surgery to remove dead tissue from an infected wound on the affected limb.  Amputation. This is surgical removal of the affected limb. This may be necessary in cases of acute ischemic limb that are not improved through medical or surgical treatments. HOME CARE INSTRUCTIONS  Take medicines only as directed by your health care provider.  Do not use any tobacco products, including cigarettes, chewing tobacco, or electronic cigarettes. If you need help quitting, ask your health care provider.  Lose weight if you are overweight, and maintain a healthy weight as directed by your health care provider.  Eat a diet that is low in fat and cholesterol. If you need help, ask your health care provider.  Exercise regularly. Ask your health care provider to suggest some good activities for you.  Use compression stockings or other mechanical devices as directed by your health care provider.  Take good care of your feet.  Wear comfortable shoes that fit well.  Check your feet often for any cuts or sores. SEEK MEDICAL CARE IF:  You have cramps in your legs  while walking.  You have leg pain when you are at rest.  You have coldness in a leg or foot.  Your skin changes.  You have erectile dysfunction.  You have cuts or sores on your feet that are not healing. SEEK IMMEDIATE MEDICAL CARE IF:  Your arm or leg turns cold and blue.  Your arms or legs become red, warm, swollen, painful, or numb.  You have chest pain or trouble breathing.  You suddenly have weakness in your face, arm, or leg.  You become very confused or lose the ability to speak.  You suddenly have a very bad headache or lose your vision.   This information is not intended to replace advice given to you by your health care provider. Make sure you discuss any questions you have with your health care provider.   Document Released: 06/15/2004 Document Revised: 05/29/2014 Document Reviewed: 10/16/2013 Elsevier Interactive Patient Education 2016 [employer]  

## 2015-04-12 ENCOUNTER — Other Ambulatory Visit: Payer: Self-pay | Admitting: Pharmacist

## 2015-04-12 NOTE — Patient Outreach (Signed)
[facility]) Care Management  [place]   04/12/2015  Carlee Tesfaye Campanella [birth year] [id number]  Subjective:  [name] is a 70 y.o. male followed by pharmacy for medication review and concerns over hypoglycemia. Reviewed patient's chart and he has a referral to endocrinology and now elevated blood glucose. Will close out of pharmacy program as goals have been met.   Nicoletta Ba, PharmD, [facility] [phone]

## 2015-04-22 DIAGNOSIS — I5022 Chronic systolic (congestive) heart failure: Secondary | ICD-10-CM | POA: Diagnosis not present

## 2015-04-23 LAB — BASIC METABOLIC PANEL
BUN: 20 mg/dL (ref 7–25)
CALCIUM: 9.1 mg/dL (ref 8.6–10.3)
CO2: 28 mmol/L (ref 20–31)
Chloride: 101 mmol/L (ref 98–110)
Creat: 1.92 mg/dL — ABNORMAL HIGH (ref 0.70–1.25)
Glucose, Bld: 269 mg/dL — ABNORMAL HIGH (ref 65–99)
POTASSIUM: 4.5 mmol/L (ref 3.5–5.3)
SODIUM: 140 mmol/L (ref 135–146)

## 2015-04-26 ENCOUNTER — Ambulatory Visit (INDEPENDENT_AMBULATORY_CARE_PROVIDER_SITE_OTHER): Payer: Medicare Other | Admitting: Cardiology

## 2015-04-26 ENCOUNTER — Encounter: Payer: Self-pay | Admitting: Cardiology

## 2015-04-26 VITALS — BP 142/64 | HR 74 | Ht 73.0 in | Wt 247.2 lb

## 2015-04-26 DIAGNOSIS — I2510 Atherosclerotic heart disease of native coronary artery without angina pectoris: Secondary | ICD-10-CM

## 2015-04-26 DIAGNOSIS — I255 Ischemic cardiomyopathy: Secondary | ICD-10-CM | POA: Diagnosis not present

## 2015-04-26 DIAGNOSIS — I5022 Chronic systolic (congestive) heart failure: Secondary | ICD-10-CM

## 2015-04-26 DIAGNOSIS — N183 Chronic kidney disease, stage 3 (moderate): Secondary | ICD-10-CM

## 2015-04-26 NOTE — Patient Instructions (Signed)
Your physician recommends that you schedule a follow-up appointment in: 2 months with Dr.McDowell   Your physician recommends that you continue on your current medications as directed. Please refer to the Current Medication list given to you today.    Your physician has requested that you have an echocardiogram. Echocardiography is a painless test that uses sound waves to create images of your heart. It provides your doctor with information about the size and shape of your heart and how well your heart's chambers and valves are working. This procedure takes approximately one hour. There are no restrictions for this procedure.  HAVE ECHO JUST BEFORE NEXT VISIT    Thank you for choosing [facility] !

## 2015-04-26 NOTE — Progress Notes (Signed)
Cardiology Office Note  Date: 04/26/2015   ID: KINKADE JACO, DOB September 18, 1945, MRN [id number]  PCP: Rosita Fire, MD  Primary Cardiologist: Rozann Lesches, MD   Chief Complaint  Patient presents with  . Cardiomyopathy  . Coronary Artery Disease    History of Present Illness: [name] is a medically complex 69 y.o. male last seen in October. He presents for a routine follow-up visit. From a cardiac perspective, he denies any angina symptoms, palpitations or device discharges. He is still not driving at this time.  We reviewed his medications which are outlined below. There has been no significant change in his diuretic regimen, he remains on Lasix 80 mg twice daily. He reports fluctuating weights at home, but generally in the high 130s to low 140s by his scales.  Continues to follow with Dr. Oneida Alar of [facility]. Recent visit noted in November with plans for arteriogram and bilateral runoff due to greater than 70% stenosis of the distal anastomosis of the left leg bypass graft found on noninvasive imaging. He reports bilateral leg pain with ambulation.  Recent lab work reviewed below, creatinine 1.9 up from 1.7.   Past Medical History  Diagnosis Date  . Ischemic cardiomyopathy     LVEF 45-50% 11/2011  . Coronary atherosclerosis of native coronary artery     Multivessel status post CABG  . Angioedema     Secondary to ACE inhibitor  . Bell palsy   . Type 2 diabetes mellitus ([place])   . AAA (abdominal aortic aneurysm) ([place])     Followed by Dr. Donnetta Hutching  . Essential hypertension, benign   . PAD (peripheral artery disease) ([place])     Followed by Dr. Donnetta Hutching  . Carotid artery disease ([place])   . Atrial fibrillation ([place])   . Hernia of abdominal wall   . AICD (automatic cardioverter/defibrillator) present   . Nephrolithiasis   . Hypercholesteremia   . Myocardial infarction ([place])   . Migraines   . Arthritis   . Gout   . Sleep apnea     Current Outpatient Prescriptions    Medication Sig Dispense Refill  . acetaminophen (TYLENOL) 500 MG tablet Take 1,000 mg by mouth 2 (two) times daily as needed for headache (pain).    Marland Kitchen allopurinol (ZYLOPRIM) 100 MG tablet Take 100 mg by mouth daily with supper.     Marland Kitchen amLODipine (NORVASC) 2.5 MG tablet Take 1 tablet (2.5 mg total) by mouth daily. 90 tablet 3  . aspirin EC 81 MG tablet Take 81 mg by mouth daily.    . carvedilol (COREG) 25 MG tablet Take 0.5 tablets (12.5 mg total) by mouth 2 (two) times daily with a meal. 30 tablet 6  . furosemide (LASIX) 80 MG tablet Take 1 tablet (80 mg total) by mouth 2 (two) times daily. 180 tablet 3  . gabapentin (NEURONTIN) 300 MG capsule Take 300 mg by mouth 2 (two) times daily.    Marland Kitchen glipiZIDE (GLUCOTROL XL) 10 MG 24 hr tablet Take 5 mg by mouth 2 (two) times daily.     . isosorbide mononitrate (IMDUR) 60 MG 24 hr tablet Take 60 mg by mouth as directed. 120 mg am, 30 mg pm    . linagliptin (TRADJENTA) 5 MG TABS tablet Take 5 mg by mouth daily.      . metFORMIN (GLUCOPHAGE) 500 MG tablet Take 500 mg by mouth 2 (two) times daily with a meal.     . nitroGLYCERIN (NITROSTAT) 0.4 MG SL tablet PLACE 0.4  MG  UNDER THE TONGUE EVERY 5 MINUTES UP TO 3 DOSES AS NEEDED FOR CHEST PAIN. 25 tablet 3  . potassium chloride SA (KLOR-CON M20) 20 MEQ tablet Take 1 tablet (20 mEq total) by mouth daily. 30 tablet 3  . simvastatin (ZOCOR) 20 MG tablet Take 1 tablet (20 mg total) by mouth at bedtime. 90 tablet 3  . traMADol (ULTRAM) 50 MG tablet Take 1 tablet (50 mg total) by mouth every 6 (six) hours as needed. 10 tablet 0   No current facility-administered medications for this visit.    Allergies:  Ace inhibitors; Codeine; Lisinopril; and Hydromorphone   Social History: The patient  reports that he quit smoking about 7 years ago. His smoking use included Cigarettes. He started smoking about 36 years ago. He has a 25 pack-year smoking history. He has never used smokeless tobacco. He reports that he does not  drink alcohol or use illicit drugs.   ROS:  Please see the history of present illness. Otherwise, complete review of systems is positive for bilateral leg pain with ambulation.  All other systems are reviewed and negative.   Physical Exam: VS:  BP 142/64 mmHg  Pulse 74  Ht 6\' 1"  (1.854 m)  Wt 247 lb 3.2 oz (112.129 kg)  BMI 32.62 kg/m2  SpO2 94%, BMI Body mass index is 32.62 kg/(m^2).  Wt Readings from Last 3 Encounters:  04/26/15 247 lb 3.2 oz (112.129 kg)  04/08/15 238 lb (107.956 kg)  04/06/15 239 lb (108.41 kg)    General: Obese male, appears comfortable at rest. HEENT: Conjunctiva and lids normal, oropharynx clear. Neck: Supple, elevated JVP, no carotid bruits, no thyromegaly. Thorax: Well-healed device pocket site on left. Lungs: Decreased breath sounds, nonlabored breathing at rest. Cardiac: Indistinct PMI, regular rate and rhythm, no S3, no pericardial rub. Abdomen: Protuberant, nontender, bowel sounds present. Extremities: Trace edema, distal pulses diminished.  ECG: ECG is not ordered today.  Recent Labwork: 09/30/2014: ALT 14*; AST 18 02/19/2015: Hemoglobin 12.9*; Platelets 327 03/08/2015: Magnesium 2.2; TSH 3.364 04/22/2015: BUN 20; Creat 1.92*; Potassium 4.5; Sodium 140     Component Value Date/Time   CHOL 118 10/16/2014 0715   TRIG 100 10/16/2014 0715   HDL 33* 10/16/2014 0715   CHOLHDL 3.6 10/16/2014 0715   VLDL 20 10/16/2014 0715   LDLCALC 65 10/16/2014 0715    Other Studies Reviewed Today:  Cardiac catheterization 10/02/2014:  Bypass graft failure with total occlusion of the saphenous vein graft to the acute marginal of the right coronary, total occlusion of the sequential saphenous vein graft to the diagonal and ramus intermedius, and total occlusion of the saphenous graft to the distal circumflex/obtuse marginal.  Patent LIMA to the LAD.  Total occlusion of the native LAD, the mid right coronary, second obtuse marginal, the first diagonal, and the  ramus intermedius.  The distal right coronary, the obtuse marginal, ramus intermedius are collateralized from the native circulation via the LIMA to LAD.  Severe left ventricular systolic dysfunction with an inferobasal aneurysm, and an ejection fraction of 20-25%.  ASSESSMENT AND PLAN:  1. Ischemic cardiomyopathy status post biventricular ICD. He underwent lead revision by Dr. Lovena Le back in October due to LV lead dislodgment. Plan is to continue current medical therapy and arrange a follow-up echocardiogram prior to his next visit for reassessment of LVEF.  2. CKD stage 3, recent creatinine 1.7-1.9.  3. Multivessel CAD with graft disease, no active angina symptoms at this time.  Current medicines were reviewed at length  with the patient today.   Orders Placed This Encounter  Procedures  . Echocardiogram    Disposition: FU with me in 2 months.   Signed, Satira Sark, MD, [facility] 04/26/2015 2:58 PM    [facility] [address], [place], [place] [address] Phone: [phone]; Fax: [phone]

## 2015-04-28 ENCOUNTER — Ambulatory Visit (HOSPITAL_COMMUNITY)
Admission: RE | Admit: 2015-04-28 | Discharge: 2015-04-28 | Disposition: A | Discharge status: Home / Self Care | Payer: Medicare Other | Source: Ambulatory Visit | Attending: Vascular Surgery | Admitting: Vascular Surgery

## 2015-04-28 ENCOUNTER — Encounter (HOSPITAL_COMMUNITY)
Admission: RE | Disposition: A | Discharge status: Home / Self Care | Payer: Medicare Other | Source: Ambulatory Visit | Attending: Vascular Surgery

## 2015-04-28 DIAGNOSIS — M109 Gout, unspecified: Secondary | ICD-10-CM | POA: Insufficient documentation

## 2015-04-28 DIAGNOSIS — E1151 Type 2 diabetes mellitus with diabetic peripheral angiopathy without gangrene: Secondary | ICD-10-CM | POA: Insufficient documentation

## 2015-04-28 DIAGNOSIS — I70402 Unspecified atherosclerosis of autologous vein bypass graft(s) of the extremities, left leg: Secondary | ICD-10-CM | POA: Insufficient documentation

## 2015-04-28 DIAGNOSIS — I10 Essential (primary) hypertension: Secondary | ICD-10-CM | POA: Diagnosis not present

## 2015-04-28 DIAGNOSIS — Z95810 Presence of automatic (implantable) cardiac defibrillator: Secondary | ICD-10-CM | POA: Insufficient documentation

## 2015-04-28 DIAGNOSIS — Z951 Presence of aortocoronary bypass graft: Secondary | ICD-10-CM | POA: Diagnosis not present

## 2015-04-28 DIAGNOSIS — Z87442 Personal history of urinary calculi: Secondary | ICD-10-CM | POA: Insufficient documentation

## 2015-04-28 DIAGNOSIS — Z87891 Personal history of nicotine dependence: Secondary | ICD-10-CM | POA: Insufficient documentation

## 2015-04-28 DIAGNOSIS — Z7984 Long term (current) use of oral hypoglycemic drugs: Secondary | ICD-10-CM | POA: Diagnosis not present

## 2015-04-28 DIAGNOSIS — E669 Obesity, unspecified: Secondary | ICD-10-CM | POA: Diagnosis not present

## 2015-04-28 DIAGNOSIS — E7800 Pure hypercholesterolemia, unspecified: Secondary | ICD-10-CM | POA: Insufficient documentation

## 2015-04-28 DIAGNOSIS — I2510 Atherosclerotic heart disease of native coronary artery without angina pectoris: Secondary | ICD-10-CM | POA: Diagnosis not present

## 2015-04-28 DIAGNOSIS — Z6831 Body mass index (BMI) 31.0-31.9, adult: Secondary | ICD-10-CM | POA: Diagnosis not present

## 2015-04-28 DIAGNOSIS — I4891 Unspecified atrial fibrillation: Secondary | ICD-10-CM | POA: Diagnosis not present

## 2015-04-28 DIAGNOSIS — Z87728 Personal history of other specified (corrected) congenital malformations of nervous system and sense organs: Secondary | ICD-10-CM | POA: Diagnosis not present

## 2015-04-28 DIAGNOSIS — Z7982 Long term (current) use of aspirin: Secondary | ICD-10-CM | POA: Diagnosis not present

## 2015-04-28 DIAGNOSIS — I252 Old myocardial infarction: Secondary | ICD-10-CM | POA: Diagnosis not present

## 2015-04-28 DIAGNOSIS — I255 Ischemic cardiomyopathy: Secondary | ICD-10-CM | POA: Insufficient documentation

## 2015-04-28 DIAGNOSIS — M1990 Unspecified osteoarthritis, unspecified site: Secondary | ICD-10-CM | POA: Insufficient documentation

## 2015-04-28 DIAGNOSIS — G4730 Sleep apnea, unspecified: Secondary | ICD-10-CM | POA: Insufficient documentation

## 2015-04-28 DIAGNOSIS — G43909 Migraine, unspecified, not intractable, without status migrainosus: Secondary | ICD-10-CM | POA: Diagnosis not present

## 2015-04-28 DIAGNOSIS — T82868A Thrombosis of vascular prosthetic devices, implants and grafts, initial encounter: Secondary | ICD-10-CM | POA: Diagnosis not present

## 2015-04-28 HISTORY — PX: LOWER EXTREMITY ANGIOGRAM: SHX5508

## 2015-04-28 HISTORY — PX: PERIPHERAL VASCULAR CATHETERIZATION: SHX172C

## 2015-04-28 LAB — POCT I-STAT, CHEM 8
BUN: 20 mg/dL (ref 6–20)
CALCIUM ION: 1.16 mmol/L (ref 1.13–1.30)
Chloride: 103 mmol/L (ref 101–111)
Creatinine, Ser: 1.6 mg/dL — ABNORMAL HIGH (ref 0.61–1.24)
GLUCOSE: 118 mg/dL — AB (ref 65–99)
HCT: 41 % (ref 39.0–52.0)
Hemoglobin: 13.9 g/dL (ref 13.0–17.0)
Potassium: 4 mmol/L (ref 3.5–5.1)
SODIUM: 142 mmol/L (ref 135–145)
TCO2: 28 mmol/L (ref 0–100)

## 2015-04-28 LAB — GLUCOSE, CAPILLARY: Glucose-Capillary: 94 mg/dL (ref 65–99)

## 2015-04-28 SURGERY — ABDOMINAL AORTOGRAM
Anesthesia: LOCAL

## 2015-04-28 MED ORDER — MIDAZOLAM HCL 2 MG/2ML IJ SOLN
INTRAMUSCULAR | Status: DC | PRN
  Administered 2015-04-28: 1 mg via INTRAVENOUS

## 2015-04-28 MED ORDER — HEPARIN (PORCINE) IN NACL 2-0.9 UNIT/ML-% IJ SOLN
INTRAMUSCULAR | Status: AC
  Filled 2015-04-28: qty 1000

## 2015-04-28 MED ORDER — FENTANYL CITRATE (PF) 100 MCG/2ML IJ SOLN
INTRAMUSCULAR | Status: DC | PRN
  Administered 2015-04-28: 25 ug via INTRAVENOUS

## 2015-04-28 MED ORDER — SODIUM CHLORIDE 0.9 % IV SOLN
INTRAVENOUS | Status: DC
  Administered 2015-04-28: 12:00:00 via INTRAVENOUS

## 2015-04-28 MED ORDER — MIDAZOLAM HCL 2 MG/2ML IJ SOLN
INTRAMUSCULAR | Status: AC
  Filled 2015-04-28: qty 2

## 2015-04-28 MED ORDER — LIDOCAINE HCL (PF) 1 % IJ SOLN
INTRAMUSCULAR | Status: AC
  Filled 2015-04-28: qty 30

## 2015-04-28 MED ORDER — LIDOCAINE HCL (PF) 1 % IJ SOLN
INTRAMUSCULAR | Status: DC | PRN
Start: 2015-04-28 — End: 2015-04-28
  Administered 2015-04-28: 15 mL

## 2015-04-28 MED ORDER — FENTANYL CITRATE (PF) 100 MCG/2ML IJ SOLN
INTRAMUSCULAR | Status: AC
  Filled 2015-04-28: qty 2

## 2015-04-28 MED ORDER — IODIXANOL 320 MG/ML IV SOLN
INTRAVENOUS | Status: DC | PRN
  Administered 2015-04-28: 97 mL via INTRA_ARTERIAL

## 2015-04-28 MED ORDER — SODIUM CHLORIDE 0.9 % IV SOLN: 1.0000 mL/kg/h | INTRAVENOUS | Status: DC

## 2015-04-28 SURGICAL SUPPLY — 16 items
CATH ANGIO 5F PIGTAIL 65CM (CATHETERS) ×3 IMPLANT
CATH QUICKCROSS SUPP .035X90CM (MICROCATHETER) ×3 IMPLANT
CATH SOFT-VU 4F 65 STRAIGHT (CATHETERS) ×2 IMPLANT
CATH SOFT-VU STRAIGHT 4F 65CM (CATHETERS) ×1
CATH STRAIGHT 5FR 65CM (CATHETERS) ×3 IMPLANT
COVER PRB 48X5XTLSCP FOLD TPE (BAG) ×2 IMPLANT
COVER PROBE 5X48 (BAG) ×1
KIT MICROINTRODUCER STIFF 5F (SHEATH) ×3 IMPLANT
KIT PV (KITS) ×3 IMPLANT
SHEATH PINNACLE 5F 10CM (SHEATH) ×3 IMPLANT
SHEATH PINNACLE 6F 10CM (SHEATH) ×3 IMPLANT
SYR MEDRAD MARK V 150ML (SYRINGE) ×3 IMPLANT
TRANSDUCER W/STOPCOCK (MISCELLANEOUS) ×3 IMPLANT
TRAY PV CATH (CUSTOM PROCEDURE TRAY) ×3 IMPLANT
WIRE AMPLATZ SS-J .035X180CM (WIRE) ×3 IMPLANT
WIRE HITORQ VERSACORE ST 145CM (WIRE) ×3 IMPLANT

## 2015-04-28 NOTE — H&P (View-Only) (Signed)
[facility] OF [place] HISTORY AND PHYSICAL -Femoral artery aneurysm repair  History of Present Illness [name] is a 69 y.o. male patient of Dr. 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   He reports that he was told by [facility] that he has a blockage in his right leg; was told that since he had arteries removed from both his legs for his CABG, that revascularization surgery should not be attempted.  He has bilateral buttocks pain with walking since about 2014, is worsening, relieved with rest.  He had a AAA repair at [facility] in 2015 and revision in April of 2016. After walking about 150 feet, both legs hurt equally from hips to calves. He had a AICD/pacemaker inserted in June 2016. Pt states his left great toe was crushed at age 60, developed gangrene, required amputation.  Pt Diabetic: Yes, not in good control lately, previously had an A1C of 5.? Pt smoker: former smoker, quit in 1997, started in his late 20's  Pt meds include: Statin :Yes Betablocker: Yes ASA: Yes Other anticoagulants/antiplatelets: no  Past Medical History  Diagnosis Date  . Ischemic cardiomyopathy     LVEF 45-50% 11/2011  . Coronary atherosclerosis of native coronary artery     Multivessel status post CABG  . Angioedema     Secondary to ACE inhibitor  . Bell palsy   . Type 2 diabetes mellitus ([place])   . AAA (abdominal aortic aneurysm) ([place])     Followed by Dr. Donnetta Hutching  . Essential hypertension, benign   . PAD (peripheral artery disease) ([place])     Followed by Dr. Donnetta Hutching  . Carotid artery disease ([place])   . Atrial fibrillation ([place])   . Hernia of abdominal wall   . AICD (automatic cardioverter/defibrillator) present   . Nephrolithiasis   . Hypercholesteremia   . Myocardial infarction ([place])    . Migraines   . Arthritis   . Gout   . Sleep apnea     Social History Social History  Substance Use Topics  . Smoking status: Former Smoker -- 1.00 packs/day for 25 years    Types: Cigarettes    Start date: 02/28/1979    Quit date: 05/23/2007  . Smokeless tobacco: Never Used  . Alcohol Use: No    Family History Family History  Problem Relation Age of Onset  . Diabetes Mother     Type  I   . Varicose Veins Mother   . Heart disease Father 22    AAA  . AAA (abdominal aortic aneurysm) Father   . Hyperlipidemia Father   . Hypertension Father     Past Surgical History  Procedure Laterality Date  . Dupuytren contracture release Left 2009  . Cataract extraction w/phaco  03/23/2011    Procedure: CATARACT EXTRACTION PHACO AND INTRAOCULAR LENS PLACEMENT (IOC);  Surgeon: Tonny Branch;  Location: [facility];  Service: Ophthalmology;  Laterality: Left;  CDE: 15.99  . Cataract extraction w/phaco  04/17/2011    Procedure: CATARACT EXTRACTION PHACO AND INTRAOCULAR LENS PLACEMENT (IOC);  Surgeon: Tonny Branch;  Location: [facility];  Service: Ophthalmology;  Laterality: Right;  CDE: 23.45  . Foot surgery Left 1963    "gangrene"  . Eye surgery    . Bypass graft popliteal to popliteal Left 06/08/2014    Procedure: BYPASS GRAFT FEMORAL ARTERY TO ABOVE KNEE POPLITEAL;  Surgeon: Rosetta Posner, MD;  Location: [facility];  Service: Vascular;  Laterality: Left;  . Abdominal aortic aneurysm repair  November 19, 2013    [facility] :  Dr. Sammuel Hines  . Abdominal aortic aneurysm repair  09-07-2014    [facility]  . Bi-ventricular implantable cardioverter defibrillator  (crt-d)  11/05/2014  . Extracorporeal shock wave lithotripsy  X 1  . Cardiac catheterization N/A 10/02/2014    Procedure: Left Heart Cath and Cors/Grafts Angiography;  Surgeon: Belva Crome, MD;  Location: [facility];  Service: Cardiovascular;  Laterality: N/A;  . Cardiac catheterization  "several"  . Coronary artery bypass graft  2001     LIMA to LAD, SVG to diagonal and ramus, SVG to OM, SVG to AM  . Ep implantable device N/A 11/05/2014    Procedure: BiV ICD Insertion CRT-D;  Surgeon: Evans Lance, MD;  Location: [place] [facility];  Service: Cardiovascular;  Laterality: N/A;  . Icd lead removal  02/24/2015  . Ep implantable device N/A 02/24/2015    Procedure: Lead Revision/Repair;  Surgeon: Evans Lance, MD;  Location: [facility] [facility];  Service: Cardiovascular;  Laterality: N/A;    Allergies  Allergen Reactions  . Ace Inhibitors Anaphylaxis  . Codeine Other (See Comments)     delirium  . Lisinopril Anaphylaxis and Swelling  . Hydromorphone Nausea And Vomiting    Current Outpatient Prescriptions  Medication Sig Dispense Refill  . acetaminophen (TYLENOL) 500 MG tablet Take 1,000 mg by mouth 2 (two) times daily as needed for headache (pain).    Marland Kitchen allopurinol (ZYLOPRIM) 100 MG tablet Take 100 mg by mouth daily with supper.     Marland Kitchen amLODipine (NORVASC) 2.5 MG tablet Take 1 tablet (2.5 mg total) by mouth daily. 90 tablet 3  . aspirin EC 81 MG tablet Take 81 mg by mouth daily.    . carvedilol (COREG) 25 MG tablet Take 0.5 tablets (12.5 mg total) by mouth 2 (two) times daily with a meal. 30 tablet 6  . furosemide (LASIX) 80 MG tablet Take 1 tablet (80 mg total) by mouth 2 (two) times daily. 180 tablet 3  . gabapentin (NEURONTIN) 300 MG capsule Take 300 mg by mouth 2 (two) times daily.    Marland Kitchen glipiZIDE (GLUCOTROL XL) 10 MG 24 hr tablet Take 5 mg by mouth 2 (two) times daily.     . isosorbide mononitrate (IMDUR) 60 MG 24 hr tablet Take 60 mg by mouth as directed. 120 mg am, 30 mg pm    . linagliptin (TRADJENTA) 5 MG TABS tablet Take 5 mg by mouth daily.      . metFORMIN (GLUCOPHAGE) 500 MG tablet Take 500 mg by mouth 2 (two) times daily with a meal.     . nitroGLYCERIN (NITROSTAT) 0.4 MG SL tablet PLACE 0.4 MG  UNDER THE TONGUE EVERY 5 MINUTES UP TO 3 DOSES AS NEEDED FOR CHEST PAIN. 25 tablet 3  . potassium chloride SA  (KLOR-CON M20) 20 MEQ tablet Take 1 tablet (20 mEq total) by mouth daily. 30 tablet 3  . simvastatin (ZOCOR) 20 MG tablet Take 1 tablet (20 mg total) by mouth at bedtime. 90 tablet 3  . traMADol (ULTRAM) 50 MG tablet Take 1 tablet (50 mg total) by mouth every 6 (six) hours as needed. 10 tablet 0   No current facility-administered medications for this visit.    ROS: See HPI for pertinent positives and negatives.   Physical Examination  Filed Vitals:   04/08/15 1510  BP: 138/68  Pulse: 67  Temp: 97.2 F (36.2 C)  TempSrc: Oral  Resp: 16  Height: 6\' 1"  (1.854 m)  Weight: 238 lb (107.956 kg)  SpO2: 96%   Body mass index is 31.41 kg/(m^2).  General: A&O x 3, WDWN, obese male. Gait: normal Eyes: PERRLA. Pulmonary: CTAB, limited air movemant in all fields, no wheezes , rales or rhonchi, occasional cough. Cardiac: regular rhythm, occasional pauses, no detected murmur.  AICD palpated subcutaneously at left upper chest.     Carotid Bruits Right Left   Negative Negative  Aorta is not palpable. Radial pulses: 2+ palpable and =                           VASCULAR EXAM: Extremities without ischemic changes, without Gangrene; without open wounds. Left great toe is surgically absent.                                                                                                          LE Pulses Right Left       FEMORAL  1+ palpable  2+ palpable        POPLITEAL  not palpable   not palpable       POSTERIOR TIBIAL  not palpable   2+ palpable        DORSALIS PEDIS      ANTERIOR TIBIAL not palpable  2+ palpable    Abdomen: softly obese, NT, large reducible umbilical hernia. Skin: no rashes, no ulcers. Musculoskeletal: no muscle wasting or atrophy.  Neurologic: A&O X 3; Appropriate Affect, MOTOR FUNCTION:  moving all extremities equally, motor strength 5/5 throughout. Speech is fluent/normal.  CN 2-12 intact.    Non-Invasive Vascular Imaging: DATE: 04/08/2015 LOWER  EXTREMITY ARTERIAL DUPLEX EVALUATION    INDICATION: Follow-up left lower extremity bypass graft     PREVIOUS INTERVENTION(S): Left femoropopliteal arterial bypass graft for aneurysm repair 06/08/2014    DUPLEX EXAM:     RIGHT  LEFT   Peak Systolic Velocity (cm/s) Ratio (if abnormal) Waveform  Peak Systolic Velocity (cm/s) Ratio (if abnormal) Waveform     Inflow Artery 225  Turbulent     Proximal Anastomosis 77  B     Proximal Graft 50  B     Mid Graft 61  B      Distal Graft 69  B     Distal Anastomosis 518 7.5 Stenotic     Outflow Artery 80  B/ turbulent  0.53 Today's ABI / TBI 0.71  0.62 Previous ABI / TBI (  ) 0.97    Waveform:    M - Monophasic       B - Biphasic       T - Triphasic  If Ankle Brachial Index (ABI) or Toe Brachial Index (TBI) performed, please see complete report  ADDITIONAL FINDINGS:     IMPRESSION: 1. Patent graft with evidence of >70%% stenosis of the distal anastomosis. 2. Suspect inflow disease due to elevated  velocities and turbulence in the common femoral artery; area is not well visualized due to shadow and scarring.    Compared to the previous exam:  Velocities have significantly increased compared to previous exam.     ASSESSMENT: [name] is a 69 y.o. 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  May 2016 ABI's were 0.62 in the right LE and 0.97 in the left LE. Today's left LE arterial duplex suggests a patent graft with evidence of >70%% stenosis of the distal anastomosis (518 cm/s) compared to 244 cm/s at the same location on 09/29/14. Suspect inflow disease due to elevated velocities and turbulence in the common femoral artery; area is not well visualized due to shadow and scarring.   PLAN:  Based on the patient's vascular studies and examination, and after discussing with Dr. Oneida Alar, pt  will be scheduled for arteriogram with bilateral run-off to lower extremities, possible intervention; indication is  >70%% stenosis of the distal anastomosis of the left leg bypass graft (518 cm/s) compared to 244 cm/s at the same location on 09/29/14.  I discussed in depth with the patient the nature of atherosclerosis, and emphasized the importance of maximal medical management including strict control of blood pressure, blood glucose, and lipid levels, obtaining regular exercise, and continued cessation of smoking.  The patient is aware that without maximal medical management the underlying atherosclerotic disease process will progress, limiting the benefit of any interventions.  The patient was given information about PAD including signs, symptoms, treatment, what symptoms should prompt the patient to seek immediate medical care, and risk reduction measures to take.  Clemon Chambers, RN, MSN, FNP-C [facility] Phone: [phone]  Clinic MD: Oneida Alar  04/08/2015 2:00 PM

## 2015-04-28 NOTE — Discharge Instructions (Signed)
NO METFORMIN FOR 2 DAYS   Angiogram, Care After These instructions give you information about caring for yourself after your procedure. Your doctor may also give you more specific instructions. Call your doctor if you have any problems or questions after your procedure.  HOME CARE  Take medicines only as told by your doctor.  Follow your doctor's instructions about:  Care of the area where the tube was inserted.  Bandage (dressing) changes and removal.  You may shower 24-48 hours after the procedure or as told by your doctor.  Do not take baths, swim, or use a hot tub until your doctor approves.  Every day, check the area where the tube was inserted. Watch for:  Redness, swelling, or pain.  Fluid, blood, or pus.  Do not apply powder or lotion to the site.  Do not lift anything that is heavier than 10 lb (4.5 kg) for 5 days or as told by your doctor.  Ask your doctor when you can:  Return to work or school.  Do physical activities or play sports.  Have sex.  Do not drive or operate heavy machinery for 24 hours or as told by your doctor.  Have someone with you for the first 24 hours after the procedure.  Keep all follow-up visits as told by your doctor. This is important. GET HELP IF:  You have a fever.   You have chills.   You have more bleeding from the area where the tube was inserted. Hold pressure on the area.  You have redness, swelling, or pain in the area where the tube was inserted.  You have fluid or pus coming from the area. GET HELP RIGHT AWAY IF:   You have a lot of pain in the area where the tube was inserted.  The area where the tube was inserted is bleeding, and the bleeding does not stop after 30 minutes of holding steady pressure on the area.  The area near or just beyond the insertion site becomes pale, cool, tingly, or numb.   This information is not intended to replace advice given to you by your health care provider. Make sure you  discuss any questions you have with your health care provider.   Document Released: 08/04/2008 Document Revised: 05/29/2014 Document Reviewed: 10/09/2012 Elsevier Interactive Patient Education [employer].

## 2015-04-28 NOTE — Progress Notes (Signed)
Up and walked and tolerated well; left groin stable no bleeding or hematoma 

## 2015-04-28 NOTE — Interval H&P Note (Signed)
History and Physical Interval Note:  04/28/2015 12:48 PM  [name]  has presented today for surgery, with the diagnosis of stenosis of left fem-pop bypass graft  The various methods of treatment have been discussed with the patient and family. After consideration of risks, benefits and other options for treatment, the patient has consented to  Procedure(s): Abdominal Aortogram (N/A) as a surgical intervention .  The patient's history has been reviewed, patient examined, no change in status, stable for surgery.  I have reviewed the patient's chart and labs.  Questions were answered to the patient's satisfaction.     Curt Jews

## 2015-04-28 NOTE — Op Note (Signed)
    OPERATIVE REPORT  DATE OF SURGERY: 04/28/2015  PATIENT: [name], 69 y.o. male MRN: [id number]  DOB: [birth year]  PRE-OPERATIVE DIAGNOSIS:  Vein graft stenosis of left femoral to popliteal bypass  POST-OPERATIVE DIAGNOSIS:  Same  PROCEDURE:  Aortogram with bilateral lower extremity runoff  SURGEON:  Curt Jews, M.D.  ANESTHESIA:   1% lidocaine with IV sedation  PLAN OF CARE:  Holding area   PATIENT DISPOSITION:  PACU - hemodynamically stable  PROCEDURE DETAILS:  patient was taken to the refresh her a cath lab placed supine position where the area both groins prepped and draped in usual sterile fashion using SonoSite visualization and local anesthesia the left common iliac artery was entered and a guidewire was passed centrally. Attempts at placing a 5 French sheath over this were unsuccessful mainly due to extensive scarring on the anterior wall of the aorta of the artery. Attempts at placing a 4 dilator and in all catheter were both unsuccessful to pass this area of stenosis. A quick cross catheter was then placed over the guidewire and this was successfully able to be placed through the heart the arterial puncture site up into the aorta. The Wooly wire was exchanged for a Amplatz wire and the 5 French sheath was successfully advanced successfully advanced over the Amplatz wire. Pigtail catheter was positioned the level of the prior placed aortic stent graft. The patient had a fenestrated graft placement [facility]. The pigtail catheter was positioned below the level of the renal arteries. AP injection showed widely patent iliac system bilaterally. The patient had a known type I endoleak at the right iliac and this did not visualized on this injection. Runoff was obtained. This revealed none complete occlusion of the right superficial femoral artery from its origin to the knee with reconstitution of collaterals and three-vessel runoff distally. On the left the  patient had a patent left femoral to below-knee popliteal bypass. This was widely patent except for an area just above the location of the knee and in the vein graft itself there was an area of approximately 1-1-1/2 cm with moderate to severe stenosis. The patient tolerated procedure without immediate complication. The pigtail catheter was removed and the sheath was pulled and pressure was held for hemostasis   An extensive discussion with patient's family. This would be very difficult technically to approach antegrade for angioplasty and would be very difficult to come up and over the flow divider of the stent graft. Only option would be surgical revision feel with his other comorbidities that close observation would be warranted. He does understand some increased risk for thromboses of his femoropopliteal related to the stenosis. Will be seen again in 3 months for continued observation and repeat noninvasive lab   Curt Jews, M.D. 04/28/2015 3:41 PM

## 2015-04-29 ENCOUNTER — Telehealth: Payer: Self-pay | Admitting: Vascular Surgery

## 2015-04-29 ENCOUNTER — Encounter (HOSPITAL_COMMUNITY): Payer: Self-pay | Admitting: Vascular Surgery

## 2015-04-29 ENCOUNTER — Other Ambulatory Visit: Payer: Self-pay | Admitting: *Deleted

## 2015-04-29 ENCOUNTER — Other Ambulatory Visit: Payer: Self-pay | Admitting: Cardiology

## 2015-04-29 DIAGNOSIS — I739 Peripheral vascular disease, unspecified: Secondary | ICD-10-CM

## 2015-04-29 DIAGNOSIS — Z48812 Encounter for surgical aftercare following surgery on the circulatory system: Secondary | ICD-10-CM

## 2015-04-29 NOTE — Telephone Encounter (Signed)
[name] had aortogram bilateral lower extremity runoff no intervention. I need to see him in 3 months with vein graft Duplex on the left leg first femoropopliteal and ankle arm indices. LM for pt re appt, dpm

## 2015-04-30 MED FILL — Heparin Sodium (Porcine) 2 Unit/ML in Sodium Chloride 0.9%: INTRAMUSCULAR | Qty: 1000 | Status: AC

## 2015-05-04 ENCOUNTER — Ambulatory Visit: Payer: Self-pay | Admitting: *Deleted

## 2015-05-10 ENCOUNTER — Ambulatory Visit (INDEPENDENT_AMBULATORY_CARE_PROVIDER_SITE_OTHER): Payer: Medicare Other | Admitting: Internal Medicine

## 2015-05-10 ENCOUNTER — Encounter: Payer: Self-pay | Admitting: *Deleted

## 2015-05-10 ENCOUNTER — Encounter: Payer: Self-pay | Admitting: Internal Medicine

## 2015-05-10 ENCOUNTER — Other Ambulatory Visit: Payer: Self-pay | Admitting: *Deleted

## 2015-05-10 VITALS — BP 150/64 | HR 74 | Ht 73.0 in | Wt 246.0 lb

## 2015-05-10 DIAGNOSIS — I255 Ischemic cardiomyopathy: Secondary | ICD-10-CM

## 2015-05-10 DIAGNOSIS — Z95810 Presence of automatic (implantable) cardiac defibrillator: Secondary | ICD-10-CM | POA: Diagnosis not present

## 2015-05-10 DIAGNOSIS — I5022 Chronic systolic (congestive) heart failure: Secondary | ICD-10-CM | POA: Diagnosis not present

## 2015-05-10 LAB — CUP PACEART INCLINIC DEVICE CHECK
Battery Remaining Longevity: 73.2
Brady Statistic RA Percent Paced: 8.7 %
Brady Statistic RV Percent Paced: 96 %
Date Time Interrogation Session: 20161219120352
HighPow Impedance: 65.25 Ohm
Implantable Lead Implant Date: 20160616
Implantable Lead Implant Date: 20161005
Implantable Lead Implant Date: 20161005
Implantable Lead Location: 753858
Implantable Lead Location: 753859
Implantable Lead Location: 753860
Implantable Lead Model: 7122
Lead Channel Impedance Value: 525 Ohm
Lead Channel Impedance Value: 562.5 Ohm
Lead Channel Impedance Value: 637.5 Ohm
Lead Channel Pacing Threshold Amplitude: 0.625 V
Lead Channel Pacing Threshold Amplitude: 0.75 V
Lead Channel Pacing Threshold Amplitude: 1.5 V
Lead Channel Pacing Threshold Pulse Width: 0.5 ms
Lead Channel Pacing Threshold Pulse Width: 0.5 ms
Lead Channel Pacing Threshold Pulse Width: 1 ms
Lead Channel Sensing Intrinsic Amplitude: 11.6 mV
Lead Channel Sensing Intrinsic Amplitude: 2.4 mV
Lead Channel Setting Pacing Amplitude: 1.75 V
Lead Channel Setting Pacing Amplitude: 2 V
Lead Channel Setting Pacing Amplitude: 2.5 V
Lead Channel Setting Pacing Pulse Width: 0.5 ms
Lead Channel Setting Pacing Pulse Width: 1 ms
Lead Channel Setting Sensing Sensitivity: 0.5 mV
Pulse Gen Serial Number: 7288352

## 2015-05-10 NOTE — Assessment & Plan Note (Signed)
His [employer] device is working normally. Will recheck in several months.

## 2015-05-10 NOTE — Assessment & Plan Note (Signed)
He denies anginal symptoms. He will continue his current meds. I discussed using the exercise bike and he will continue with this.

## 2015-05-10 NOTE — Patient Outreach (Signed)
[facility]) Care Management   05/10/2015  Kaceton Jha Hughlett 12/12/1945 [id number]  Corson Aybar Ditter is an 69 y.o. male  Subjective: Routine home visit with patient, HIPAA verified, pt states " I finally got my exercise bike and have been using it some, I talked with my doctor about it and he wants me to monitor my pulse" pt states he has not lost any weight but wants to, pt got discouraged with weight and stopped writing it down along with blood sugar but states " I'm starting back to do all this and use the exercise bike" Pt saw Dr. Lovena Le today, had pacemaker checked.  Pt reports he has been riding [place] and his children have also been assisting with transportation.    Objective:   Filed Vitals:   05/10/15 1516  BP: 158/70  Pulse: 62  Resp: 16  Weight: 238 lb (107.956 kg)  SpO2: 98%   ROS  Physical Exam  Constitutional: He is oriented to person, place, and time. He appears well-developed and well-nourished.  HENT:  Head: Normocephalic.  Neck: Normal range of motion. Neck supple.  Cardiovascular: Normal rate and regular rhythm.   Respiratory: Effort normal and breath sounds normal.  GI: Soft. Bowel sounds are normal.  Musculoskeletal: Normal range of motion. He exhibits no edema.  Neurological: He is alert and oriented to person, place, and time.  Skin: Skin is warm and dry.  Psychiatric: He has a normal mood and affect. His behavior is normal. Judgment and thought content normal.    Current Medications:   Current Outpatient Prescriptions  Medication Sig Dispense Refill  . acetaminophen (TYLENOL) 500 MG tablet Take 1,000 mg by mouth 2 (two) times daily as needed for headache (pain).    Marland Kitchen allopurinol (ZYLOPRIM) 100 MG tablet Take 100 mg by mouth daily with supper.     Marland Kitchen amLODipine (NORVASC) 2.5 MG tablet Take 1 tablet (2.5 mg total) by mouth daily. 90 tablet 3  . aspirin EC 81 MG tablet Take 81 mg by mouth daily.    . carvedilol (COREG) 12.5 MG  tablet TAKE 1 TABLET(=12.5MG ) TWICE DAILY, WITH MEALS. 60 tablet 6  . furosemide (LASIX) 80 MG tablet Take 1 tablet (80 mg total) by mouth 2 (two) times daily. 180 tablet 3  . gabapentin (NEURONTIN) 300 MG capsule Take 300 mg by mouth 2 (two) times daily.    Marland Kitchen glipiZIDE (GLUCOTROL XL) 10 MG 24 hr tablet Take 5 mg by mouth 2 (two) times daily.     . isosorbide mononitrate (IMDUR) 60 MG 24 hr tablet Take 60 mg by mouth as directed. 120 mg am, 30 mg pm    . linagliptin (TRADJENTA) 5 MG TABS tablet Take 5 mg by mouth daily.      . metFORMIN (GLUCOPHAGE) 500 MG tablet Take 500 mg by mouth 2 (two) times daily with a meal.     . nitroGLYCERIN (NITROSTAT) 0.4 MG SL tablet PLACE 0.4 MG  UNDER THE TONGUE EVERY 5 MINUTES UP TO 3 DOSES AS NEEDED FOR CHEST PAIN. 25 tablet 3  . potassium chloride SA (KLOR-CON M20) 20 MEQ tablet Take 1 tablet (20 mEq total) by mouth daily. 30 tablet 3  . simvastatin (ZOCOR) 20 MG tablet Take 1 tablet (20 mg total) by mouth at bedtime. 90 tablet 3  . traMADol (ULTRAM) 50 MG tablet Take 1 tablet (50 mg total) by mouth every 6 (six) hours as needed. 10 tablet 0  . carvedilol (COREG) 25 MG  tablet Take 0.5 tablets (12.5 mg total) by mouth 2 (two) times daily with a meal. 30 tablet 6   No current facility-administered medications for this visit.    Functional Status:   In your present state of health, do you have any difficulty performing the following activities: 04/28/2015 02/25/2015  Hearing? N -  Vision? N -  Difficulty concentrating or making decisions? N -  Walking or climbing stairs? Y -  Dressing or bathing? N -  Doing errands, shopping? - N  Conservation officer, nature and eating ? - -  Using the Toilet? - -  In the past six months, have you accidently leaked urine? - -  Do you have problems with loss of bowel control? - -  Managing your Medications? - -  Managing your Finances? - -  Housekeeping or managing your Housekeeping? - -    Fall/Depression Screening:    PHQ 2/9  Scores 02/16/2015  PHQ - 2 Score 0    Assessment: RN CM gave pt low salt poster and reviewed with pt, gave [facility] calendar and reviewed with pt.   RN CM praised and encouraged pt with his efforts to exercise, pt is just beginning to ride stationary bike, pt feels endurance is not going to increase if he does not stay mobile and active.  RN CM reminded pt to continue weighing and checking CBG regularly and record in [facility] calendar, take to MD appointments.  Plan: follow up with home visit 06/08/15 Plan to transfer to health coach if no further community needs  Jacqlyn Larsen [facility], [place] Coordinator [phone]

## 2015-05-10 NOTE — Patient Instructions (Signed)
Your physician wants you to follow-up in: 9 Months with Dr. Lovena Le.  You will receive a reminder letter in the mail two months in advance. If you don't receive a letter, please call our office to schedule the follow-up appointment.  Remote monitoring is used to monitor your Pacemaker of ICD from home. This monitoring reduces the number of office visits required to check your device to one time per year. It allows [place] to keep an eye on the functioning of your device to ensure it is working properly. You are scheduled for a device check from home on 08/09/15. You may send your transmission at any time that day. If you have a wireless device, the transmission will be sent automatically. After your physician reviews your transmission, you will receive a postcard with your next transmission date.  Your physician recommends that you continue on your current medications as directed. Please refer to the Current Medication list given to you today.  If you need a refill on your cardiac medications before your next appointment, please call your pharmacy.  Thank you for choosing [facility]!

## 2015-05-10 NOTE — Progress Notes (Signed)
HPI [name] returns today for ongoing ICD followup. He is a pleasant 69 yo man with chronic systolic heart failure, CAD, s/p CABG, LBBB, who underwent BiV ICD implant approx. several months ago. He developed diaphragmatic stimulation and retraction of his LV lead and underwent revision. He has done well in the interim. He denies chest pain or sob.  Allergies  Allergen Reactions  . Ace Inhibitors Anaphylaxis  . Codeine Other (See Comments)     delirium  . Lisinopril Anaphylaxis and Swelling  . Hydromorphone Nausea And Vomiting     Current Outpatient Prescriptions  Medication Sig Dispense Refill  . acetaminophen (TYLENOL) 500 MG tablet Take 1,000 mg by mouth 2 (two) times daily as needed for headache (pain).    Marland Kitchen allopurinol (ZYLOPRIM) 100 MG tablet Take 100 mg by mouth daily with supper.     Marland Kitchen amLODipine (NORVASC) 2.5 MG tablet Take 1 tablet (2.5 mg total) by mouth daily. 90 tablet 3  . aspirin EC 81 MG tablet Take 81 mg by mouth daily.    . carvedilol (COREG) 12.5 MG tablet TAKE 1 TABLET(=12.5MG ) TWICE DAILY, WITH MEALS. 60 tablet 6  . carvedilol (COREG) 25 MG tablet Take 0.5 tablets (12.5 mg total) by mouth 2 (two) times daily with a meal. 30 tablet 6  . furosemide (LASIX) 80 MG tablet Take 1 tablet (80 mg total) by mouth 2 (two) times daily. 180 tablet 3  . gabapentin (NEURONTIN) 300 MG capsule Take 300 mg by mouth 2 (two) times daily.    Marland Kitchen glipiZIDE (GLUCOTROL XL) 10 MG 24 hr tablet Take 5 mg by mouth 2 (two) times daily.     . isosorbide mononitrate (IMDUR) 60 MG 24 hr tablet Take 60 mg by mouth as directed. 120 mg am, 30 mg pm    . linagliptin (TRADJENTA) 5 MG TABS tablet Take 5 mg by mouth daily.      . metFORMIN (GLUCOPHAGE) 500 MG tablet Take 500 mg by mouth 2 (two) times daily with a meal.     . nitroGLYCERIN (NITROSTAT) 0.4 MG SL tablet PLACE 0.4 MG  UNDER THE TONGUE EVERY 5 MINUTES UP TO 3 DOSES AS NEEDED FOR CHEST PAIN. 25 tablet 3  . potassium chloride SA  (KLOR-CON M20) 20 MEQ tablet Take 1 tablet (20 mEq total) by mouth daily. 30 tablet 3  . simvastatin (ZOCOR) 20 MG tablet Take 1 tablet (20 mg total) by mouth at bedtime. 90 tablet 3  . traMADol (ULTRAM) 50 MG tablet Take 1 tablet (50 mg total) by mouth every 6 (six) hours as needed. 10 tablet 0   No current facility-administered medications for this visit.     Past Medical History  Diagnosis Date  . Ischemic cardiomyopathy     LVEF 45-50% 11/2011  . Coronary atherosclerosis of native coronary artery     Multivessel status post CABG  . Angioedema     Secondary to ACE inhibitor  . Bell palsy   . Type 2 diabetes mellitus ([place])   . AAA (abdominal aortic aneurysm) ([place])     Followed by Dr. Donnetta Hutching  . Essential hypertension, benign   . PAD (peripheral artery disease) ([place])     Followed by Dr. Donnetta Hutching  . Carotid artery disease ([place])   . Atrial fibrillation ([place])   . Hernia of abdominal wall   . AICD (automatic cardioverter/defibrillator) present   . Nephrolithiasis   . Hypercholesteremia   . Myocardial infarction ([place])   . Migraines   .  Arthritis   . Gout   . Sleep apnea     ROS:   All systems reviewed and negative except as noted in the HPI.   Past Surgical History  Procedure Laterality Date  . Dupuytren contracture release Left 2009  . Cataract extraction w/phaco  03/23/2011    Procedure: CATARACT EXTRACTION PHACO AND INTRAOCULAR LENS PLACEMENT (IOC);  Surgeon: Tonny Branch;  [facility]: [facility];  Service: Ophthalmology;  Laterality: Left;  CDE: 15.99  . Cataract extraction w/phaco  04/17/2011    Procedure: CATARACT EXTRACTION PHACO AND INTRAOCULAR LENS PLACEMENT (IOC);  Surgeon: Tonny Branch;  Location: [facility];  Service: Ophthalmology;  Laterality: Right;  CDE: 23.45  . Foot surgery Left 1963    "gangrene"  . Eye surgery    . Bypass graft popliteal to popliteal Left 06/08/2014    Procedure: BYPASS GRAFT FEMORAL ARTERY TO ABOVE KNEE POPLITEAL;  Surgeon: Rosetta Posner, MD;  Location:  [place];  Service: Vascular;  Laterality: Left;  . Abdominal aortic aneurysm repair  November 19, 2013    [facility] :  Dr. Sammuel Hines  . Abdominal aortic aneurysm repair  09-07-2014    [facility]  . Bi-ventricular implantable cardioverter defibrillator  (crt-d)  11/05/2014  . Extracorporeal shock wave lithotripsy  X 1  . Cardiac catheterization N/A 10/02/2014    Procedure: Left Heart Cath and Cors/Grafts Angiography;  Surgeon: Belva Crome, MD;  Location: [place] [facility];  Service: Cardiovascular;  Laterality: N/A;  . Cardiac catheterization  "several"  . Coronary artery bypass graft  2001    LIMA to LAD, SVG to diagonal and ramus, SVG to OM, SVG to AM  . Ep implantable device N/A 11/05/2014    Procedure: BiV ICD Insertion CRT-D;  Surgeon: Evans Lance, MD;  Location: [place] [facility];  Service: Cardiovascular;  Laterality: N/A;  . Icd lead removal  02/24/2015  . Ep implantable device N/A 02/24/2015    Procedure: Lead Revision/Repair;  Surgeon: Evans Lance, MD;  Location: [place] [facility];  Service: Cardiovascular;  Laterality: N/A;  . Peripheral vascular catheterization N/A 04/28/2015    Procedure: Abdominal Aortogram;  Surgeon: Rosetta Posner, MD;  Location: [place] [facility];  Service: Cardiovascular;  Laterality: N/A;  . Lower extremity angiogram Bilateral 04/28/2015    Procedure: Lower Extremity Angiogram;  Surgeon: Rosetta Posner, MD;  Location: [place] [facility];  Service: Cardiovascular;  Laterality: Bilateral;     Family History  Problem Relation Age of Onset  . Diabetes Mother     Type  I   . Varicose Veins Mother   . Heart disease Father 9    AAA  . AAA (abdominal aortic aneurysm) Father   . Hyperlipidemia Father   . Hypertension Father      Social History   Social History  . Marital Status: Divorced    Spouse Name: N/A  . Number of Children: N/A  . Years of Education: N/A   Occupational History  . Not on file.   Social History Main Topics  . Smoking  status: Former Smoker -- 1.00 packs/day for 25 years    Types: Cigarettes    Start date: 02/28/1979    Quit date: 05/23/2007  . Smokeless tobacco: Never Used  . Alcohol Use: No  . Drug Use: No  . Sexual Activity: No   Other Topics Concern  . Not on file   Social History Narrative     BP 150/64  mmHg  Pulse 74  Ht 6\' 1"  (1.854 m)  Wt 246 lb (111.585 kg)  BMI 32.46 kg/m2  SpO2 96%  Physical Exam:  Well appearing 69 yo man, NAD HEENT: Unremarkable Neck:  7 cm JVD, no thyromegally Back:  No CVA tenderness Lungs:  Clear with no wheezes HEART:  Regular rate rhythm, no murmurs, no rubs, no clicks Abd:  soft, positive bowel sounds, no organomegally, no rebound, no guarding Ext:   no edema, no cyanosis, no clubbing Skin:  No rashes no nodules Neuro:  CN II through XII intact, motor grossly intact    ICD interogation - normal BiV ICD function  Assess/Plan:

## 2015-05-10 NOTE — Assessment & Plan Note (Signed)
His symptoms are class 2. He will continue his current meds.  

## 2015-05-12 ENCOUNTER — Ambulatory Visit: Payer: Self-pay | Admitting: *Deleted

## 2015-06-08 ENCOUNTER — Ambulatory Visit: Payer: Self-pay | Admitting: *Deleted

## 2015-06-08 ENCOUNTER — Other Ambulatory Visit: Payer: Self-pay | Admitting: *Deleted

## 2015-06-08 NOTE — Patient Outreach (Signed)
06/08/15- Telephone call to patient to remind of today's scheduled home visit, spoke with pt, HIPAA verified, pt states " you can't come out today, my brother is visiting and I haven't seen him in a long time"  Pt states " I will call you next week to reschedule", pt busy and could not talk further.  Jacqlyn Larsen [facility], [place] Coordinator [phone]

## 2015-06-09 ENCOUNTER — Other Ambulatory Visit: Payer: Self-pay | Admitting: Internal Medicine

## 2015-06-09 ENCOUNTER — Other Ambulatory Visit: Payer: Self-pay | Admitting: Cardiology

## 2015-06-16 ENCOUNTER — Other Ambulatory Visit: Payer: Self-pay | Admitting: *Deleted

## 2015-06-16 NOTE — Patient Outreach (Signed)
06/16/15-Telephone call to patient to reschedule home visit, no answer to telephone, left voicemail requesting return phone call.  Jacqlyn Larsen [facility], [place] Coordinator [phone]

## 2015-06-18 ENCOUNTER — Other Ambulatory Visit: Payer: Self-pay | Admitting: *Deleted

## 2015-06-18 NOTE — Patient Outreach (Signed)
06/18/15- Telephone call (2nd attempt) to reschedule home visit, no answer to telephone and no option to leave voicemail.  Jacqlyn Larsen [facility], [place] Coordinator [phone]

## 2015-06-21 DIAGNOSIS — H43811 Vitreous degeneration, right eye: Secondary | ICD-10-CM | POA: Diagnosis not present

## 2015-06-25 ENCOUNTER — Other Ambulatory Visit: Payer: Self-pay | Admitting: *Deleted

## 2015-06-25 ENCOUNTER — Encounter: Payer: Self-pay | Admitting: *Deleted

## 2015-06-25 NOTE — Patient Outreach (Signed)
06/25/15- Telephone call to patient to reschedule home visit, third attempt, no answer to telephone and no option to leave voicemail.  RN CM mailed letter to patient's home requesting return phone call, will keep case open for 10 days.  Jacqlyn Larsen [facility], [place] Coordinator [phone]

## 2015-06-28 ENCOUNTER — Other Ambulatory Visit: Payer: Self-pay | Admitting: Cardiology

## 2015-06-28 ENCOUNTER — Ambulatory Visit (HOSPITAL_COMMUNITY)
Admission: RE | Admit: 2015-06-28 | Discharge: 2015-06-28 | Disposition: A | Discharge status: Home / Self Care | Payer: Medicare Other | Source: Ambulatory Visit | Attending: Cardiology | Admitting: Cardiology

## 2015-06-28 DIAGNOSIS — I5022 Chronic systolic (congestive) heart failure: Secondary | ICD-10-CM | POA: Diagnosis not present

## 2015-06-28 DIAGNOSIS — E119 Type 2 diabetes mellitus without complications: Secondary | ICD-10-CM | POA: Insufficient documentation

## 2015-06-28 DIAGNOSIS — I517 Cardiomegaly: Secondary | ICD-10-CM | POA: Insufficient documentation

## 2015-06-28 DIAGNOSIS — I358 Other nonrheumatic aortic valve disorders: Secondary | ICD-10-CM | POA: Insufficient documentation

## 2015-06-28 DIAGNOSIS — I10 Essential (primary) hypertension: Secondary | ICD-10-CM | POA: Insufficient documentation

## 2015-06-28 DIAGNOSIS — I429 Cardiomyopathy, unspecified: Secondary | ICD-10-CM | POA: Insufficient documentation

## 2015-06-28 DIAGNOSIS — I059 Rheumatic mitral valve disease, unspecified: Secondary | ICD-10-CM | POA: Insufficient documentation

## 2015-06-28 MED ORDER — PERFLUTREN LIPID MICROSPHERE
1.0000 mL | INTRAVENOUS | Status: AC | PRN
  Administered 2015-06-28: 2 mL via INTRAVENOUS
  Administered 2015-06-28: 1 mL via INTRAVENOUS
  Filled 2015-06-28: qty 10

## 2015-06-28 NOTE — Progress Notes (Signed)
IV 22g  To right post hand saline lock for definity injection.   2nd attempt. Patient tolerated well. Flushed well

## 2015-06-28 NOTE — Progress Notes (Signed)
Iv 22g to right hand taken out. Catheter in tact. Patient tolerated well

## 2015-06-29 ENCOUNTER — Encounter: Payer: Self-pay | Admitting: Cardiology

## 2015-06-29 ENCOUNTER — Encounter: Payer: Self-pay | Admitting: *Deleted

## 2015-06-29 ENCOUNTER — Ambulatory Visit (INDEPENDENT_AMBULATORY_CARE_PROVIDER_SITE_OTHER): Payer: Medicare Other | Admitting: Cardiology

## 2015-06-29 VITALS — BP 144/60 | HR 76 | Ht 73.0 in | Wt 250.0 lb

## 2015-06-29 DIAGNOSIS — I25709 Atherosclerosis of coronary artery bypass graft(s), unspecified, with unspecified angina pectoris: Secondary | ICD-10-CM

## 2015-06-29 DIAGNOSIS — I5023 Acute on chronic systolic (congestive) heart failure: Secondary | ICD-10-CM

## 2015-06-29 DIAGNOSIS — Z23 Encounter for immunization: Secondary | ICD-10-CM

## 2015-06-29 DIAGNOSIS — I739 Peripheral vascular disease, unspecified: Secondary | ICD-10-CM

## 2015-06-29 DIAGNOSIS — I10 Essential (primary) hypertension: Secondary | ICD-10-CM

## 2015-06-29 DIAGNOSIS — I255 Ischemic cardiomyopathy: Secondary | ICD-10-CM

## 2015-06-29 DIAGNOSIS — Z95810 Presence of automatic (implantable) cardiac defibrillator: Secondary | ICD-10-CM

## 2015-06-29 DIAGNOSIS — I493 Ventricular premature depolarization: Secondary | ICD-10-CM

## 2015-06-29 MED ORDER — CARVEDILOL 25 MG PO TABS: 25.0000 mg | ORAL_TABLET | Freq: Two times a day (BID) | ORAL | Status: DC

## 2015-06-29 MED ORDER — FUROSEMIDE 80 MG PO TABS: 80.0000 mg | ORAL_TABLET | Freq: Two times a day (BID) | ORAL | Status: DC

## 2015-06-29 NOTE — Progress Notes (Signed)
Cardiology Office Note  Date: 06/29/2015   ID: UGOCHUKWU NORUM, DOB [birth year], MRN [id number]  PCP: Rosita Fire, MD  Primary Cardiologist: Rozann Lesches, MD   Chief Complaint  Patient presents with  . Coronary Artery Disease  . Cardiomyopathy    History of Present Illness: [name] is a medically complex 70 y.o. male last seen in December 2016. He had an interval visit with Dr. Lovena Le, I reviewed the note. He presents for a follow-up visit. Reports low energy, but admits that he has not been exercising. His weight has gone up about 10 pounds. He does not report any palpitations or device shocks. No angina symptoms. He has NYHA class 2-3 dyspnea depending on level of activity.  I reviewed his follow-up echocardiogram report, done yesterday and outlined below. LVEF has improved somewhat compared to his cardiac catheterization. We discussed the results today.  We also went over his medications. He reports compliance. It sounds like he is weighing himself daily, but has not made any adjustments in his Lasix. He has an ACE inhibitor allergy. He continues on aspirin, Norvasc, Coreg, Imdur, and Zocor. We discussed increasing his Coreg as he still has fairly frequent PVCs, and also advancing his Lasix dose temporarily.  He reports limitations related to claudication, a chronic problem. He has tried to do some walking, also has a stationary bicycle. I've encouraged him to try to be more regular with activity as this may help his claudication symptoms and improve his energy.  His blood pressure is not optimal. Increasing Coreg may be helpful in this regard. He also has further room to advance Norvasc if needed.  Past Medical History  Diagnosis Date  . Ischemic cardiomyopathy     LVEF 45-50% 11/2011  . Coronary atherosclerosis of native coronary artery     Multivessel status post CABG  . Angioedema     Secondary to ACE inhibitor  . Bell palsy   . Type 2 diabetes mellitus  ([place])   . AAA (abdominal aortic aneurysm) ([place])     Followed by Dr. Donnetta Hutching  . Essential hypertension, benign   . PAD (peripheral artery disease) ([place])     Followed by Dr. Donnetta Hutching  . Carotid artery disease ([place])   . Atrial fibrillation ([place])   . Hernia of abdominal wall   . AICD (automatic cardioverter/defibrillator) present   . Nephrolithiasis   . Hypercholesteremia   . Myocardial infarction ([place])   . Migraines   . Arthritis   . Gout   . Sleep apnea     Past Surgical History  Procedure Laterality Date  . Dupuytren contracture release Left 2009  . Cataract extraction w/phaco  03/23/2011    Procedure: CATARACT EXTRACTION PHACO AND INTRAOCULAR LENS PLACEMENT (IOC);  Surgeon: Tonny Branch;  [facility];  Service: Ophthalmology;  Laterality: Left;  CDE: 15.99  . Cataract extraction w/phaco  04/17/2011    Procedure: CATARACT EXTRACTION PHACO AND INTRAOCULAR LENS PLACEMENT (IOC);  Surgeon: Tonny Branch;  [facility];  Service: Ophthalmology;  Laterality: Right;  CDE: 23.45  . Foot surgery Left 1963    "gangrene"  . Eye surgery    . Bypass graft popliteal to popliteal Left 06/08/2014    Procedure: BYPASS GRAFT FEMORAL ARTERY TO ABOVE KNEE POPLITEAL;  Surgeon: Rosetta Posner, MD;  Location: [place];  Service: Vascular;  Laterality: Left;  . Abdominal aortic aneurysm repair  November 19, 2013    [facility] :  Dr. Sammuel Hines  . Abdominal  aortic aneurysm repair  09-07-2014    [facility]  . Bi-ventricular implantable cardioverter defibrillator  (crt-d)  11/05/2014  . Extracorporeal shock wave lithotripsy  X 1  . Cardiac catheterization N/A 10/02/2014    Procedure: Left Heart Cath and Cors/Grafts Angiography;  Surgeon: Belva Crome, MD;  Location: [place] [facility];  Service: Cardiovascular;  Laterality: N/A;  . Cardiac catheterization  "several"  . Coronary artery bypass graft  2001    LIMA to LAD, SVG to diagonal and ramus, SVG to OM, SVG to AM  . Ep implantable device N/A 11/05/2014     Procedure: BiV ICD Insertion CRT-D;  Surgeon: Evans Lance, MD;  Location: [place] [facility];  Service: Cardiovascular;  Laterality: N/A;  . Icd lead removal  02/24/2015  . Ep implantable device N/A 02/24/2015    Procedure: Lead Revision/Repair;  Surgeon: Evans Lance, MD;  Location: [place] [facility];  Service: Cardiovascular;  Laterality: N/A;  . Peripheral vascular catheterization N/A 04/28/2015    Procedure: Abdominal Aortogram;  Surgeon: Rosetta Posner, MD;  Location: [place] [facility];  Service: Cardiovascular;  Laterality: N/A;  . Lower extremity angiogram Bilateral 04/28/2015    Procedure: Lower Extremity Angiogram;  Surgeon: Rosetta Posner, MD;  Location: [place] [facility];  Service: Cardiovascular;  Laterality: Bilateral;    Current Outpatient Prescriptions  Medication Sig Dispense Refill  . acetaminophen (TYLENOL) 500 MG tablet Take 1,000 mg by mouth 2 (two) times daily as needed for headache (pain).    Marland Kitchen allopurinol (ZYLOPRIM) 100 MG tablet Take 100 mg by mouth daily with supper.     Marland Kitchen amLODipine (NORVASC) 2.5 MG tablet TAKE 1 TABLET ONCE DAILY. 30 tablet 3  . aspirin EC 81 MG tablet Take 81 mg by mouth daily.    . furosemide (LASIX) 80 MG tablet Take 1 tablet (80 mg total) by mouth 2 (two) times daily. 180 tablet 3  . gabapentin (NEURONTIN) 300 MG capsule Take 300 mg by mouth 2 (two) times daily.    Marland Kitchen glipiZIDE (GLUCOTROL XL) 10 MG 24 hr tablet Take 5 mg by mouth 2 (two) times daily.     . isosorbide mononitrate (IMDUR) 60 MG 24 hr tablet Take 60 mg by mouth as directed. 120 mg am, 30 mg pm    . linagliptin (TRADJENTA) 5 MG TABS tablet Take 5 mg by mouth daily.      . metFORMIN (GLUCOPHAGE) 500 MG tablet Take 500 mg by mouth 2 (two) times daily with a meal.     . nitroGLYCERIN (NITROSTAT) 0.4 MG SL tablet PLACE 0.4 MG  UNDER THE TONGUE EVERY 5 MINUTES UP TO 3 DOSES AS NEEDED FOR CHEST PAIN. 25 tablet 3  . potassium chloride (K-DUR) 10 MEQ tablet TAKE 2 TABLETS BY MOUTH ONCE  DAILY. 60 tablet 3  . simvastatin (ZOCOR) 20 MG tablet TAKE 1 TABLET BY MOUTH AT BEDTIME. 30 tablet 3  . traMADol (ULTRAM) 50 MG tablet Take 1 tablet (50 mg total) by mouth every 6 (six) hours as needed. 10 tablet 0  . carvedilol (COREG) 25 MG tablet Take 1 tablet (25 mg total) by mouth 2 (two) times daily. 180 tablet 3   No current facility-administered medications for this visit.   Allergies:  Ace inhibitors; Codeine; Lisinopril; and Hydromorphone   Social History: The patient  reports that he quit smoking about 8 years ago. His smoking use included Cigarettes. He started smoking about 36 years ago. He has  a 25 pack-year smoking history. He has never used smokeless tobacco. He reports that he does not drink alcohol or use illicit drugs.   ROS:  Please see the history of present illness. Otherwise, complete review of systems is positive for knee pains, mild leg edema.  All other systems are reviewed and negative.   Physical Exam: VS:  BP 144/60 mmHg  Pulse 76  Ht 6\' 1"  (1.854 m)  Wt 250 lb (113.399 kg)  BMI 32.99 kg/m2  SpO2 94%, BMI Body mass index is 32.99 kg/(m^2).  Wt Readings from Last 3 Encounters:  06/29/15 250 lb (113.399 kg)  05/10/15 238 lb (107.956 kg)  05/10/15 246 lb (111.585 kg)    General: Obese male, appears comfortable at rest. HEENT: Conjunctiva and lids normal, oropharynx clear. Neck: Supple, elevated JVP, no carotid bruits, no thyromegaly. Thorax: Well-healed device pocket site on left. Lungs: Decreased breath sounds, nonlabored breathing at rest. Cardiac: Indistinct PMI, regular rate and rhythm, no S3, no pericardial rub. Abdomen: Protuberant, nontender, bowel sounds present. Extremities: 1+ edema, distal pulses diminished. Skin: Warm and dry. Musculoskeletal: No kyphosis. Neuropsychiatric: Alert and oriented 3, affect appropriate.  ECG: I personally reviewed his tracing from 02/25/2015 which showed a ventricular paced rhythm with atrial sensing.  Recent  Labwork: 09/30/2014: ALT 14*; AST 18 02/19/2015: Platelets 327 03/08/2015: Magnesium 2.2; TSH 3.364 04/28/2015: BUN 20; Creatinine, Ser 1.60*; Hemoglobin 13.9; Potassium 4.0; Sodium 142     Component Value Date/Time   CHOL 118 10/16/2014 0715   TRIG 100 10/16/2014 0715   HDL 33* 10/16/2014 0715   CHOLHDL 3.6 10/16/2014 0715   VLDL 20 10/16/2014 0715   LDLCALC 65 10/16/2014 0715    Other Studies Reviewed Today:  Cardiac catheterization 10/02/2014:  Bypass graft failure with total occlusion of the saphenous vein graft to the acute marginal of the right coronary, total occlusion of the sequential saphenous vein graft to the diagonal and ramus intermedius, and total occlusion of the saphenous graft to the distal circumflex/obtuse marginal.  Patent LIMA to the LAD.  Total occlusion of the native LAD, the mid right coronary, second obtuse marginal, the first diagonal, and the ramus intermedius.  The distal right coronary, the obtuse marginal, ramus intermedius are collateralized from the native circulation via the LIMA to LAD.  Severe left ventricular systolic dysfunction with an inferobasal aneurysm, and an ejection fraction of 20-25%.  Echocardiogram 06/28/2015: Study Conclusions  - Procedure narrative: Transthoracic echocardiography. Image quality was fair. The study was technically difficult, as a result of poor sound wave transmission. Intravenous contrast (Definity) was administered. - Left ventricle: The cavity size was normal. Systolic function was moderately reduced. The estimated ejection fraction was in the range of 35% to 40%. Diffusely hypokinetic. Doppler parameters are consistent with abnormal left ventricular relaxation (grade 1 diastolic dysfunction). Doppler parameters are consistent with high ventricular filling pressure. Mild to moderate concentric left ventricular hypertrophy. - Regional wall motion abnormality: Severe hypokinesis of the  mid inferior myocardium; mild hypokinesis of the mid inferoseptal myocardium. - Ventricular septum: Septal motion showed abnormal function and dyssynergy. These changes are consistent with right ventricular pacing. - Aortic valve: Poorly visualized. Mildly thickened, mildly calcified leaflets. There may be mild aortic valve stenosis. - Mitral valve: Calcified annulus. - Left atrium: The atrium was mildly dilated. - Right ventricle: Pacer wire or catheter noted in right ventricle.  Assessment and Plan:  1. Multivessel CAD status post CABG with graft disease (all SVGs occluded) but patent LIMA to LAD. Fortunately, despite  fairly severe disease he has adequate angina control medical therapy. Plan to continue current regimen with increase in beta blocker as noted below.  2. Ischemic cardiomyopathy with LVEF up to the range of 35-40%. He has a [employer] biventricular ICD in place and has undergone LV lead revision by Dr. Lovena Le. He had biventricular pacing 96% of the time on most recent check. No device shocks. Increasing Coreg to 25 mg twice daily with relatively frequent PVCs.  3. Acute on chronic systolic heart failure. His weight is up and he does have some evidence of excess volume. We discussed keeping close track of daily weight, will increase Lasix to 120 mg the morning and 80 mg in the evening through the week and can then go back to his prior dose. He can add an additional 40 mg to his morning dose based on weight increase of 2-3 pounds in 24 hours.  4. Peripheral arterial disease with claudication. He follows with Dr. Donnetta Hutching. I encouraged him to try to keep a regular walking regimen to improve symptoms.  5. Essential hypertension. Blood pressure is not optimal. Hopefully we will see some improvement on increased dose Coreg.  6. CKD stage III, creatinine 1.6 in December 2016. Follow-up BMET for next visit.  Current medicines were reviewed with the patient today.   Orders  Placed This Encounter  Procedures  . Flu Vaccine QUAD 36+ mos IM    Disposition: FU with me in 2 months.   Signed, Satira Sark, MD, [facility] 06/29/2015 10:23 AM    [facility] [place]. [address], [place], [place] [address] Phone: [phone]; Fax: [phone]

## 2015-06-29 NOTE — Patient Instructions (Addendum)
Your physician recommends that you schedule a follow-up appointment in: 2 months with Dr Domenic Polite   INCREASE Coreg to 25 mg twice a day   For the next week, take Lasix 120 mg in the am, and 80 mg in the afternoon. THEN go back to regular dose of 80 mg twice a day   Weigh self daily,first thing in the morning after you have emptied bladder and before you get dressed.   Just BEFORE next visit get lab work : BMET      Thank you for choosing [facility] !

## 2015-07-01 ENCOUNTER — Telehealth: Payer: Self-pay | Admitting: *Deleted

## 2015-07-01 NOTE — Telephone Encounter (Signed)
-----   Message from Merlene Laughter, LPN sent at [phone]  7:56 AM EST ----- Patient informed via mychart

## 2015-07-06 NOTE — Telephone Encounter (Signed)
Patient informed via vm. 

## 2015-07-12 ENCOUNTER — Encounter: Payer: Self-pay | Admitting: *Deleted

## 2015-07-12 ENCOUNTER — Other Ambulatory Visit: Payer: Self-pay | Admitting: *Deleted

## 2015-07-12 NOTE — Patient Outreach (Signed)
07/12/15-Pt did not respond to correspondence mailed to his home, case closed.  RN CM faxed primary MD letter informing of discharge and mailed letter to patient's home.  Jacqlyn Larsen [facility], [place] Coordinator [phone]

## 2015-08-03 ENCOUNTER — Other Ambulatory Visit (HOSPITAL_COMMUNITY): Payer: Medicare Other

## 2015-08-03 ENCOUNTER — Ambulatory Visit: Payer: Medicare Other | Admitting: Vascular Surgery

## 2015-08-03 ENCOUNTER — Encounter (HOSPITAL_COMMUNITY): Payer: Medicare Other

## 2015-08-06 ENCOUNTER — Other Ambulatory Visit: Payer: Self-pay | Admitting: Cardiology

## 2015-08-09 ENCOUNTER — Ambulatory Visit (INDEPENDENT_AMBULATORY_CARE_PROVIDER_SITE_OTHER): Payer: Medicare Other | Admitting: *Deleted

## 2015-08-09 DIAGNOSIS — I255 Ischemic cardiomyopathy: Secondary | ICD-10-CM

## 2015-08-09 DIAGNOSIS — Z95810 Presence of automatic (implantable) cardiac defibrillator: Secondary | ICD-10-CM

## 2015-08-10 ENCOUNTER — Telehealth: Payer: Self-pay | Admitting: Cardiology

## 2015-08-10 NOTE — Telephone Encounter (Signed)
Pt called into the office and stated that he was shocked in October 2016. He wanted to know when he could start driving again. I informed pt that he can start driving again [id number]. Pt said that he has been good w/ not driving and wanted to know if he could start driving sooner. I informed pt that the [place] DMV rule is no driving for 6 months after the therapy was received. Pt verbalized understanding.

## 2015-08-10 NOTE — Progress Notes (Signed)
Remote ICD transmission.   

## 2015-09-01 ENCOUNTER — Ambulatory Visit: Payer: Medicare Other | Admitting: Cardiology

## 2015-09-03 DIAGNOSIS — I739 Peripheral vascular disease, unspecified: Secondary | ICD-10-CM | POA: Diagnosis not present

## 2015-09-03 DIAGNOSIS — I2510 Atherosclerotic heart disease of native coronary artery without angina pectoris: Secondary | ICD-10-CM | POA: Diagnosis not present

## 2015-09-03 DIAGNOSIS — E785 Hyperlipidemia, unspecified: Secondary | ICD-10-CM | POA: Diagnosis not present

## 2015-09-03 DIAGNOSIS — I5022 Chronic systolic (congestive) heart failure: Secondary | ICD-10-CM | POA: Diagnosis not present

## 2015-09-03 DIAGNOSIS — E669 Obesity, unspecified: Secondary | ICD-10-CM | POA: Diagnosis not present

## 2015-09-03 DIAGNOSIS — I509 Heart failure, unspecified: Secondary | ICD-10-CM | POA: Diagnosis not present

## 2015-09-03 DIAGNOSIS — E119 Type 2 diabetes mellitus without complications: Secondary | ICD-10-CM | POA: Diagnosis not present

## 2015-09-03 DIAGNOSIS — I10 Essential (primary) hypertension: Secondary | ICD-10-CM | POA: Diagnosis not present

## 2015-09-03 DIAGNOSIS — R739 Hyperglycemia, unspecified: Secondary | ICD-10-CM | POA: Diagnosis not present

## 2015-09-03 DIAGNOSIS — I714 Abdominal aortic aneurysm, without rupture: Secondary | ICD-10-CM | POA: Diagnosis not present

## 2015-09-03 DIAGNOSIS — E1142 Type 2 diabetes mellitus with diabetic polyneuropathy: Secondary | ICD-10-CM | POA: Diagnosis not present

## 2015-09-10 ENCOUNTER — Encounter: Payer: Self-pay | Admitting: Cardiology

## 2015-09-10 LAB — CUP PACEART REMOTE DEVICE CHECK
Brady Statistic AP VP Percent: 11 %
Brady Statistic AS VP Percent: 85 %
Brady Statistic AS VS Percent: 2.5 %
Brady Statistic RA Percent Paced: 8.7 %
Date Time Interrogation Session: 20170320060016
HighPow Impedance: 62 Ohm
HighPow Impedance: 62 Ohm
Implantable Lead Location: 753859
Lead Channel Impedance Value: 480 Ohm
Lead Channel Pacing Threshold Amplitude: 0.625 V
Lead Channel Pacing Threshold Pulse Width: 0.5 ms
Lead Channel Pacing Threshold Pulse Width: 1 ms
Lead Channel Sensing Intrinsic Amplitude: 2.2 mV
Lead Channel Setting Pacing Amplitude: 1.625
Lead Channel Setting Pacing Amplitude: 2 V
Lead Channel Setting Pacing Pulse Width: 0.5 ms
Lead Channel Setting Pacing Pulse Width: 1 ms
MDC IDC LEAD IMPLANT DT: 20160616
MDC IDC LEAD IMPLANT DT: 20161005
MDC IDC LEAD IMPLANT DT: 20161005
MDC IDC LEAD LOCATION: 753858
MDC IDC LEAD LOCATION: 753860
MDC IDC LEAD MODEL: 7122
MDC IDC MSMT BATTERY REMAINING LONGEVITY: 61 mo
MDC IDC MSMT BATTERY REMAINING PERCENTAGE: 86 %
MDC IDC MSMT BATTERY VOLTAGE: 3.02 V
MDC IDC MSMT LEADCHNL LV IMPEDANCE VALUE: 700 Ohm
MDC IDC MSMT LEADCHNL LV PACING THRESHOLD AMPLITUDE: 1.625 V
MDC IDC MSMT LEADCHNL RA PACING THRESHOLD PULSEWIDTH: 0.5 ms
MDC IDC MSMT LEADCHNL RV IMPEDANCE VALUE: 440 Ohm
MDC IDC MSMT LEADCHNL RV PACING THRESHOLD AMPLITUDE: 0.625 V
MDC IDC MSMT LEADCHNL RV SENSING INTR AMPL: 11.6 mV
MDC IDC PG SERIAL: 7288352
MDC IDC SET LEADCHNL LV PACING AMPLITUDE: 2.625
MDC IDC SET LEADCHNL RV SENSING SENSITIVITY: 0.5 mV
MDC IDC STAT BRADY AP VS PERCENT: 1 %

## 2015-09-16 ENCOUNTER — Encounter: Payer: Self-pay | Admitting: Vascular Surgery

## 2015-09-21 ENCOUNTER — Ambulatory Visit (INDEPENDENT_AMBULATORY_CARE_PROVIDER_SITE_OTHER): Payer: Medicare Other | Admitting: Vascular Surgery

## 2015-09-21 ENCOUNTER — Ambulatory Visit (INDEPENDENT_AMBULATORY_CARE_PROVIDER_SITE_OTHER)
Admission: RE | Admit: 2015-09-21 | Discharge: 2015-09-21 | Disposition: A | Discharge status: Home / Self Care | Payer: Medicare Other | Source: Ambulatory Visit | Attending: Vascular Surgery | Admitting: Vascular Surgery

## 2015-09-21 ENCOUNTER — Ambulatory Visit (HOSPITAL_COMMUNITY)
Admission: RE | Admit: 2015-09-21 | Discharge: 2015-09-21 | Disposition: A | Discharge status: Home / Self Care | Payer: Medicare Other | Source: Ambulatory Visit | Attending: Vascular Surgery | Admitting: Vascular Surgery

## 2015-09-21 ENCOUNTER — Encounter: Payer: Self-pay | Admitting: Vascular Surgery

## 2015-09-21 VITALS — BP 126/63 | HR 60 | Temp 97.7°F | Resp 16 | Ht 73.0 in | Wt 252.0 lb

## 2015-09-21 DIAGNOSIS — Z48812 Encounter for surgical aftercare following surgery on the circulatory system: Secondary | ICD-10-CM

## 2015-09-21 DIAGNOSIS — I739 Peripheral vascular disease, unspecified: Secondary | ICD-10-CM

## 2015-09-21 DIAGNOSIS — I10 Essential (primary) hypertension: Secondary | ICD-10-CM | POA: Diagnosis not present

## 2015-09-21 DIAGNOSIS — I255 Ischemic cardiomyopathy: Secondary | ICD-10-CM

## 2015-09-21 DIAGNOSIS — E785 Hyperlipidemia, unspecified: Secondary | ICD-10-CM | POA: Diagnosis not present

## 2015-09-21 DIAGNOSIS — Z95828 Presence of other vascular implants and grafts: Secondary | ICD-10-CM | POA: Diagnosis not present

## 2015-09-21 DIAGNOSIS — E119 Type 2 diabetes mellitus without complications: Secondary | ICD-10-CM | POA: Insufficient documentation

## 2015-09-21 NOTE — Progress Notes (Signed)
HISTORY AND PHYSICAL     CC:  ultrasound Referring Provider:  Rosita Fire, MD  HPI: This is a 70 y.o. male who underwent a left femoral to above knee popliteal bypass with translocated non reversed saphenous vein 06/08/14 by Dr. Donnetta Hutching.  In December, he was having worsening claudication and underwent an aortogram, which revealed a widely patent bypass except for an area just above the location of the knee and in the vein graft itself, there was an area of ~ 1-1.5 cm with moderate to severe stenosis.  Dr. Donnetta Hutching discussed with he and his family that it would be very difficult technically to approach antegrade for angioplasty and would be very difficult to come up and over the lfow divider of the stent graft.  His only option would be surgical revision and it was felt that with his other comorbidities that close observation would be arranted.  He did understand that he was at some increased risk of thromboses of his femoropopliteal bypass related to the stenosis.  He is following up today for this.  He states that his claudication is getting worse.  He states that if he tries to get in a hurry, he struggles getting from the lobby to the exam room, but does much better if he takes his time.  He states that both his legs bother him now and there is not one that is worse than the other.  He states that about 3 months ago, he was at the grocery store and was struggling at that time.  He states that he also has bilateral hip pain, which has been an issue with him in the past.  He states that he does not have any non healing wounds.  He states that he needs to trim his toenails.  He does this himself.  He had a juxta-renal AAA, which was repaired in [place] in July 2016.  He is supposed to have his follow up appointment there next week.    He is on a CCB and beta blocker for blood pressure management.  He takes a daily aspirin.  He is on a statin for cholesterol management.   He does have diabetes and has  been told to stop his Metformin due to his kidney function.  He does take Glipizide and Tradjenta.    Of note he does have a left great toe amputation site from when he was a teenager.  Past Medical History  Diagnosis Date  . Ischemic cardiomyopathy     LVEF 45-50% 11/2011  . Coronary atherosclerosis of native coronary artery     Multivessel status post CABG  . Angioedema     Secondary to ACE inhibitor  . Bell palsy   . Type 2 diabetes mellitus ([place])   . AAA (abdominal aortic aneurysm) ([place])     Followed by Dr. Donnetta Hutching  . Essential hypertension, benign   . PAD (peripheral artery disease) ([place])     Followed by Dr. Donnetta Hutching  . Carotid artery disease ([place])   . Atrial fibrillation ([place])   . Hernia of abdominal wall   . AICD (automatic cardioverter/defibrillator) present   . Nephrolithiasis   . Hypercholesteremia   . Myocardial infarction ([facility])   . Migraines   . Arthritis   . Gout   . Sleep apnea     Past Surgical History  Procedure Laterality Date  . Dupuytren contracture release Left 2009  . Cataract extraction w/phaco  03/23/2011    Procedure: CATARACT EXTRACTION PHACO AND INTRAOCULAR  LENS PLACEMENT (IOC);  Surgeon: Tonny Branch;  [facility]: [facility];  Service: Ophthalmology;  Laterality: Left;  CDE: 15.99  . Cataract extraction w/phaco  04/17/2011    Procedure: CATARACT EXTRACTION PHACO AND INTRAOCULAR LENS PLACEMENT (IOC);  Surgeon: Tonny Branch;  Location: [facility];  Service: Ophthalmology;  Laterality: Right;  CDE: 23.45  . Foot surgery Left 1963    "gangrene"  . Eye surgery    . Bypass graft popliteal to popliteal Left 06/08/2014    Procedure: BYPASS GRAFT FEMORAL ARTERY TO ABOVE KNEE POPLITEAL;  Surgeon: Rosetta Posner, MD;  Location: [place];  Service: Vascular;  Laterality: Left;  . Abdominal aortic aneurysm repair  November 19, 2013    [facility] :  Dr. Sammuel Hines  . Abdominal aortic aneurysm repair  09-07-2014    [facility]  . Bi-ventricular implantable cardioverter defibrillator   (crt-d)  11/05/2014  . Extracorporeal shock wave lithotripsy  X 1  . Cardiac catheterization N/A 10/02/2014    Procedure: Left Heart Cath and Cors/Grafts Angiography;  Surgeon: Belva Crome, MD;  Location: [place] [facility];  Service: Cardiovascular;  Laterality: N/A;  . Cardiac catheterization  "several"  . Coronary artery bypass graft  2001    LIMA to LAD, SVG to diagonal and ramus, SVG to OM, SVG to AM  . Ep implantable device N/A 11/05/2014    Procedure: BiV ICD Insertion CRT-D;  Surgeon: Evans Lance, MD;  Location: [place] [facility];  Service: Cardiovascular;  Laterality: N/A;  . Icd lead removal  02/24/2015  . Ep implantable device N/A 02/24/2015    Procedure: Lead Revision/Repair;  Surgeon: Evans Lance, MD;  Location: [place] [facility];  Service: Cardiovascular;  Laterality: N/A;  . Peripheral vascular catheterization N/A 04/28/2015    Procedure: Abdominal Aortogram;  Surgeon: Rosetta Posner, MD;  Location: [place] [facility];  Service: Cardiovascular;  Laterality: N/A;  . Lower extremity angiogram Bilateral 04/28/2015    Procedure: Lower Extremity Angiogram;  Surgeon: Rosetta Posner, MD;  Location: [place] [facility];  Service: Cardiovascular;  Laterality: Bilateral;    Allergies  Allergen Reactions  . Ace Inhibitors Anaphylaxis  . Codeine Other (See Comments)     delirium  . Lisinopril Anaphylaxis and Swelling  . Hydromorphone Nausea And Vomiting    Current Outpatient Prescriptions  Medication Sig Dispense Refill  . acetaminophen (TYLENOL) 500 MG tablet Take 1,000 mg by mouth 2 (two) times daily as needed for headache (pain).    Marland Kitchen allopurinol (ZYLOPRIM) 100 MG tablet Take 100 mg by mouth daily with supper.     Marland Kitchen amLODipine (NORVASC) 2.5 MG tablet TAKE 1 TABLET ONCE DAILY. 30 tablet 3  . amLODipine (NORVASC) 2.5 MG tablet TAKE 1 TABLET ONCE DAILY. 30 tablet 6  . aspirin EC 81 MG tablet Take 81 mg by mouth daily.    . carvedilol (COREG) 25 MG tablet Take 1 tablet (25 mg  total) by mouth 2 (two) times daily. 180 tablet 3  . furosemide (LASIX) 80 MG tablet Take 1 tablet (80 mg total) by mouth 2 (two) times daily. 180 tablet 3  . gabapentin (NEURONTIN) 300 MG capsule Take 300 mg by mouth 2 (two) times daily.    Marland Kitchen glipiZIDE (GLUCOTROL XL) 10 MG 24 hr tablet Take 10 mg by mouth 2 (two) times daily.     . isosorbide mononitrate (IMDUR) 60 MG 24 hr tablet Take 60 mg by mouth as directed. 120 mg am, 30  mg pm    . linagliptin (TRADJENTA) 5 MG TABS tablet Take 5 mg by mouth daily.      . nitroGLYCERIN (NITROSTAT) 0.4 MG SL tablet PLACE 0.4 MG  UNDER THE TONGUE EVERY 5 MINUTES UP TO 3 DOSES AS NEEDED FOR CHEST PAIN. 25 tablet 3  . potassium chloride (K-DUR) 10 MEQ tablet TAKE 2 TABLETS BY MOUTH ONCE DAILY. 60 tablet 3  . simvastatin (ZOCOR) 20 MG tablet TAKE 1 TABLET BY MOUTH AT BEDTIME. 30 tablet 3  . traMADol (ULTRAM) 50 MG tablet Take 1 tablet (50 mg total) by mouth every 6 (six) hours as needed. 10 tablet 0   No current facility-administered medications for this visit.    Family History  Problem Relation Age of Onset  . Diabetes Mother     Type  I   . Varicose Veins Mother   . Heart disease Father 33    AAA  . AAA (abdominal aortic aneurysm) Father   . Hyperlipidemia Father   . Hypertension Father     Social History   Social History  . Marital Status: Divorced    Spouse Name: N/A  . Number of Children: N/A  . Years of Education: N/A   Occupational History  . Not on file.   Social History Main Topics  . Smoking status: Former Smoker -- 1.00 packs/day for 25 years    Types: Cigarettes    Start date: 02/28/1979    Quit date: 05/23/2007  . Smokeless tobacco: Never Used  . Alcohol Use: No  . Drug Use: No  . Sexual Activity: No   Other Topics Concern  . Not on file   Social History Narrative     REVIEW OF SYSTEMS:   [X]  denotes positive finding, [ ]  denotes negative finding Cardiac  Comments:  Chest pain or chest pressure:    Shortness  of breath upon exertion: x   Short of breath when lying flat: x   Irregular heart rhythm: x       Vascular    Pain in calf, thigh, or hip brought on by ambulation: x   Pain in feet at night that wakes you up from your sleep:  x   Blood clot in your veins:    Leg swelling:  x       Pulmonary    Oxygen at home:    Productive cough:     Wheezing:         Neurologic    Sudden weakness in  legs:  x bilaterally  Sudden numbness in arms or legs:     Sudden onset of difficulty speaking or slurred speech:    Temporary loss of vision in one eye:     Problems with dizziness:         Gastrointestinal    Blood in stool:     Vomited blood:         Genitourinary    Burning when urinating:     Blood in urine:        Psychiatric    Major depression:         Hematologic    Bleeding problems:    Problems with blood clotting too easily:        Skin    Rashes or ulcers:        Constitutional    Fever or chills:      PHYSICAL EXAMINATION:  Filed Vitals:   09/21/15 1435  BP: 126/63  Pulse: 60  Temp:  97.7 F (36.5 C)  Resp: 16   Body mass index is 33.25 kg/(m^2).  General:  WDWN in NAD; vital signs documented above Gait: Not observed HENT: WNL, normocephalic Pulmonary: normal non-labored breathing , without Rales, rhonchi,  wheezing Cardiac: regular HR, without  Murmurs, rubs or gallops; without carotid bruits Abdomen: soft, NT, no masses Skin: without rashes Vascular Exam/Pulses:  Right Left  Radial 2+ (normal) 2+ (normal)  Ulnar Unable to palpate  Unable to palpate   Popliteal Unable to palpate  Unable to palpate   DP Unable to palpate  Unable to palpate   PT Unable to palpate  Unable to palpate    Extremities: without ischemic changes, without Gangrene , without cellulitis; without open wounds;  Musculoskeletal: no muscle wasting or atrophy  Neurologic: A&O X 3;  No focal weakness or paresthesias are detected Psychiatric:  The pt has Normal  affect.   Non-Invasive Vascular Imaging:   ABI's 09/21/15: Right:  0.55 Left:  0.73  Previous ABI's 04/08/15: Right:  0.53 Left:  0.71  Arterial duplex LLE 09/21/15: Patent with area of stenosis at the popliteal anastomosis site  Pt meds includes: Statin:  Yes.   Beta Blocker:  Yes.   Aspirin:  Yes.   ACEI:  No. ARB:  No. Other Antiplatelet/Anticoagulant:  No.    ASSESSMENT/PLAN:: 70 y.o. male with PAD with hx of left femoral to popliteal bypass grafting 06/08/14 with limiting claudication bilaterally.     -pt continues to have limiting claudication bilaterally   -it would technically be difficult to approach this with retrograde approach with aortogram due to his stent graft repair for his juxta renal AAA.  An antegrade approach would also be technically difficult and would increase the risk of harming the bypass graft.   -the pt does not have any non healing wounds-he is encouraged to continue a walking regimen. -he does have bilateral hip pain, which he has had in the past and is most likely arthritic in nature but it is possible it could be due to issues with his stent graft-he goes for his follow up next week.   -he is diabetic and trims his own toenails.  I have encouraged him to have someone else trim his nails or have a podiatrist trim his nails as if he gets a wound, it could become limb threatening.   -pt does have his f/u for his stent graft repair in [facility] Sep 30, 2015. -he will f/u with Dr. Donnetta Hutching in 6 months with ABI's and arterial duplex of his bypass graft. -he will call sooner if he develops problems before then.    Leontine Locket, PA-C [facility] [phone]  Clinic MD:  Pt seen and examined in conjunction with Dr. Donnetta Hutching  I have examined the patient, reviewed and agree with above. She has a great deal of difficulty walking. There are multiple factors including lumbar disc and orthopedic issues. He may have SFA and potentially stenoses  in his iliac systems as well. He is to see Dr.Farber next week for follow-up of his demonstrated stent graft. Have encouraged him to continue his walking program and certainly would defer any operative treatment unless he would develop limb threatening ischemia. We'll see him again in 6 months with repeat duplex  Curt Jews, MD 09/21/2015 3:51 PM

## 2015-09-23 DIAGNOSIS — I5022 Chronic systolic (congestive) heart failure: Secondary | ICD-10-CM | POA: Diagnosis not present

## 2015-09-24 ENCOUNTER — Encounter: Payer: Self-pay | Admitting: Cardiology

## 2015-09-24 LAB — BASIC METABOLIC PANEL
BUN: 29 mg/dL — ABNORMAL HIGH (ref 7–25)
CALCIUM: 8.5 mg/dL — AB (ref 8.6–10.3)
CO2: 26 mmol/L (ref 20–31)
CREATININE: 1.92 mg/dL — AB (ref 0.70–1.25)
Chloride: 103 mmol/L (ref 98–110)
GLUCOSE: 238 mg/dL — AB (ref 65–99)
Potassium: 3.8 mmol/L (ref 3.5–5.3)
SODIUM: 141 mmol/L (ref 135–146)

## 2015-09-27 ENCOUNTER — Ambulatory Visit (INDEPENDENT_AMBULATORY_CARE_PROVIDER_SITE_OTHER): Payer: Medicare Other | Admitting: Cardiology

## 2015-09-27 ENCOUNTER — Encounter: Payer: Self-pay | Admitting: Cardiology

## 2015-09-27 VITALS — BP 148/70 | HR 64 | Ht 73.0 in | Wt 252.0 lb

## 2015-09-27 DIAGNOSIS — I255 Ischemic cardiomyopathy: Secondary | ICD-10-CM

## 2015-09-27 DIAGNOSIS — I5022 Chronic systolic (congestive) heart failure: Secondary | ICD-10-CM

## 2015-09-27 DIAGNOSIS — N183 Chronic kidney disease, stage 3 (moderate): Secondary | ICD-10-CM

## 2015-09-27 DIAGNOSIS — I25709 Atherosclerosis of coronary artery bypass graft(s), unspecified, with unspecified angina pectoris: Secondary | ICD-10-CM

## 2015-09-27 DIAGNOSIS — I739 Peripheral vascular disease, unspecified: Secondary | ICD-10-CM

## 2015-09-27 NOTE — Patient Instructions (Addendum)
Your physician recommends that you schedule a follow-up appointment in:  3 months   Please get lab work: BMET  Just BEFORE next visit    Your physician recommends that you continue on your current medications as directed. Please refer to the Current Medication list given to you today.    Thank you for choosing [facility] !

## 2015-09-27 NOTE — Progress Notes (Signed)
Cardiology Office Note  Date: 09/27/2015   ID: RAQUON HARSH, DOB [birth year], MRN [id number]  PCP: Rosita Fire, MD  Primary Cardiologist: Rozann Lesches, MD   Chief Complaint  Patient presents with  . Cardiomyopathy  . Coronary Artery Disease    History of Present Illness: [name] is a medically complex 70 y.o. male last seen in February. At that time we increased his Coreg dose in the setting of relatively frequent PVCs, also advanced Lasix dose for management of acute on chronic systolic heart failure. Most recent follow-up lab work is outlined below. Creatinine was 1.9, has generally fluctuated between 1.6 and 1.9 over the last 6 months. He is now taking Lasix at 80 mg twice daily and his weight is stable.  Interval follow-up with Dr. Donnetta Hutching noted. He also has vascular follow-up scheduled later this week at [facility].  I reviewed his medications. Cardiac regimen includes aspirin, Coreg, Norvasc, Lasix at 80 mg twice daily, potassium supplements, Imdur, and Zocor.  Past Medical History  Diagnosis Date  . Ischemic cardiomyopathy     LVEF 45-50% 11/2011  . Coronary atherosclerosis of native coronary artery     Multivessel status post CABG  . Angioedema     Secondary to ACE inhibitor  . Bell palsy   . Type 2 diabetes mellitus ([place])   . AAA (abdominal aortic aneurysm) ([place])     Followed by Dr. Donnetta Hutching  . Essential hypertension, benign   . PAD (peripheral artery disease) ([place])     Followed by Dr. Donnetta Hutching  . Carotid artery disease ([place])   . Atrial fibrillation ([place])   . Hernia of abdominal wall   . AICD (automatic cardioverter/defibrillator) present   . Nephrolithiasis   . Hypercholesteremia   . Myocardial infarction ([place])   . Migraines   . Arthritis   . Gout   . Sleep apnea     Past Surgical History  Procedure Laterality Date  . Dupuytren contracture release Left 2009  . Cataract extraction w/phaco  03/23/2011    Procedure: CATARACT EXTRACTION PHACO AND  INTRAOCULAR LENS PLACEMENT (IOC);  Surgeon: Tonny Branch;  Location[facility];  Service: Ophthalmology;  Laterality: Left;  CDE: 15.99  . Cataract extraction w/phaco  04/17/2011    Procedure: CATARACT EXTRACTION PHACO AND INTRAOCULAR LENS PLACEMENT (IOC);  Surgeon: Tonny Branch;  [facility];  Service: Ophthalmology;  Laterality: Right;  CDE: 23.45  . Foot surgery Left 1963    "gangrene"  . Eye surgery    . Bypass graft popliteal to popliteal Left 06/08/2014    Procedure: BYPASS GRAFT FEMORAL ARTERY TO ABOVE KNEE POPLITEAL;  Surgeon: Rosetta Posner, MD;  Location: [place];  Service: Vascular;  Laterality: Left;  . Abdominal aortic aneurysm repair  November 19, 2013    [facility] :  Dr. Sammuel Hines  . Abdominal aortic aneurysm repair  09-07-2014    [facility]  . Bi-ventricular implantable cardioverter defibrillator  (crt-d)  11/05/2014  . Extracorporeal shock wave lithotripsy  X 1  . Cardiac catheterization N/A 10/02/2014    Procedure: Left Heart Cath and Cors/Grafts Angiography;  Surgeon: Belva Crome, MD;  Location: [place] [facility];  Service: Cardiovascular;  Laterality: N/A;  . Cardiac catheterization  "several"  . Coronary artery bypass graft  2001    LIMA to LAD, SVG to diagonal and ramus, SVG to OM, SVG to AM  . Ep implantable device N/A 11/05/2014    Procedure: BiV ICD Insertion CRT-D;  Surgeon: Evans Lance, MD;  Location: [place] [facility];  Service: Cardiovascular;  Laterality: N/A;  . Icd lead removal  02/24/2015  . Ep implantable device N/A 02/24/2015    Procedure: Lead Revision/Repair;  Surgeon: Evans Lance, MD;  Location: [place] [facility];  Service: Cardiovascular;  Laterality: N/A;  . Peripheral vascular catheterization N/A 04/28/2015    Procedure: Abdominal Aortogram;  Surgeon: Rosetta Posner, MD;  Location: [place] [facility];  Service: Cardiovascular;  Laterality: N/A;  . Lower extremity angiogram Bilateral 04/28/2015    Procedure: Lower Extremity Angiogram;  Surgeon: Rosetta Posner, MD;  Location: [place] [facility];  Service: Cardiovascular;  Laterality: Bilateral;    Current Outpatient Prescriptions  Medication Sig Dispense Refill  . acetaminophen (TYLENOL) 500 MG tablet Take 1,000 mg by mouth 2 (two) times daily as needed for headache (pain).    Marland Kitchen allopurinol (ZYLOPRIM) 100 MG tablet Take 100 mg by mouth daily with supper.     Marland Kitchen amLODipine (NORVASC) 2.5 MG tablet TAKE 1 TABLET ONCE DAILY. 30 tablet 3  . amLODipine (NORVASC) 2.5 MG tablet TAKE 1 TABLET ONCE DAILY. 30 tablet 6  . aspirin EC 81 MG tablet Take 81 mg by mouth daily.    . carvedilol (COREG) 25 MG tablet Take 1 tablet (25 mg total) by mouth 2 (two) times daily. 180 tablet 3  . furosemide (LASIX) 80 MG tablet Take 1 tablet (80 mg total) by mouth 2 (two) times daily. 180 tablet 3  . gabapentin (NEURONTIN) 300 MG capsule Take 300 mg by mouth 2 (two) times daily.    Marland Kitchen glipiZIDE (GLUCOTROL XL) 10 MG 24 hr tablet Take 10 mg by mouth 2 (two) times daily.     . isosorbide mononitrate (IMDUR) 60 MG 24 hr tablet Take 60 mg by mouth as directed. 120 mg am, 30 mg pm    . linagliptin (TRADJENTA) 5 MG TABS tablet Take 5 mg by mouth daily.      . nitroGLYCERIN (NITROSTAT) 0.4 MG SL tablet PLACE 0.4 MG  UNDER THE TONGUE EVERY 5 MINUTES UP TO 3 DOSES AS NEEDED FOR CHEST PAIN. 25 tablet 3  . potassium chloride (K-DUR) 10 MEQ tablet TAKE 2 TABLETS BY MOUTH ONCE DAILY. 60 tablet 3  . simvastatin (ZOCOR) 20 MG tablet TAKE 1 TABLET BY MOUTH AT BEDTIME. 30 tablet 3  . traMADol (ULTRAM) 50 MG tablet Take 1 tablet (50 mg total) by mouth every 6 (six) hours as needed. 10 tablet 0   No current facility-administered medications for this visit.   Allergies:  Ace inhibitors; Codeine; Lisinopril; and Hydromorphone   Social History: The patient  reports that he quit smoking about 8 years ago. His smoking use included Cigarettes. He started smoking about 36 years ago. He has a 25 pack-year smoking history. He has never used  smokeless tobacco. He reports that he does not drink alcohol or use illicit drugs.   ROS:  Please see the history of present illness. Otherwise, complete review of systems is positive for chronic claudication and leg weakness.  All other systems are reviewed and negative.   Physical Exam: VS:  BP 148/70 mmHg  Pulse 64  Ht 6\' 1"  (1.854 m)  Wt 252 lb (114.306 kg)  BMI 33.25 kg/m2  SpO2 96%, BMI Body mass index is 33.25 kg/(m^2).  Wt Readings from Last 3 Encounters:  09/27/15 252 lb (114.306 kg)  09/21/15 252 lb (114.306 kg)  06/29/15 250 lb (113.399 kg)  General: Obese male, appears comfortable at rest. HEENT: Conjunctiva and lids normal, oropharynx clear. Neck: Supple, elevated JVP, no carotid bruits, no thyromegaly. Thorax: Well-healed device pocket site on left. Lungs: Decreased breath sounds, nonlabored breathing at rest. Cardiac: Indistinct PMI, regular rate and rhythm, no S3, no pericardial rub. Abdomen: Protuberant, nontender, bowel sounds present. Extremities: 1+ edema, distal pulses diminished. Skin: Warm and dry. Musculoskeletal: No kyphosis. Neuropsychiatric: Alert and oriented 3, affect appropriate.  ECG: I personally reviewed the prior tracing from 02/25/2015 showing a ventricular paced rhythm with atrial sensing.  Recent Labwork: 09/30/2014: ALT 14*; AST 18 02/19/2015: Platelets 327 03/08/2015: Magnesium 2.2; TSH 3.364 04/28/2015: Hemoglobin 13.9 09/23/2015: BUN 29*; Creat 1.92*; Potassium 3.8; Sodium 141     Component Value Date/Time   CHOL 118 10/16/2014 0715   TRIG 100 10/16/2014 0715   HDL 33* 10/16/2014 0715   CHOLHDL 3.6 10/16/2014 0715   VLDL 20 10/16/2014 0715   LDLCALC 65 10/16/2014 0715    Other Studies Reviewed Today:  Cardiac catheterization 10/02/2014:  Bypass graft failure with total occlusion of the saphenous vein graft to the acute marginal of the right coronary, total occlusion of the sequential saphenous vein graft to the diagonal and ramus  intermedius, and total occlusion of the saphenous graft to the distal circumflex/obtuse marginal.  Patent LIMA to the LAD.  Total occlusion of the native LAD, the mid right coronary, second obtuse marginal, the first diagonal, and the ramus intermedius.  The distal right coronary, the obtuse marginal, ramus intermedius are collateralized from the native circulation via the LIMA to LAD.  Severe left ventricular systolic dysfunction with an inferobasal aneurysm, and an ejection fraction of 20-25%.  Echocardiogram 06/28/2015: Study Conclusions  - Procedure narrative: Transthoracic echocardiography. Image quality was fair. The study was technically difficult, as a result of poor sound wave transmission. Intravenous contrast (Definity) was administered. - Left ventricle: The cavity size was normal. Systolic function was moderately reduced. The estimated ejection fraction was in the range of 35% to 40%. Diffusely hypokinetic. Doppler parameters are consistent with abnormal left ventricular relaxation (grade 1 diastolic dysfunction). Doppler parameters are consistent with high ventricular filling pressure. Mild to moderate concentric left ventricular hypertrophy. - Regional wall motion abnormality: Severe hypokinesis of the mid inferior myocardium; mild hypokinesis of the mid inferoseptal myocardium. - Ventricular septum: Septal motion showed abnormal function and dyssynergy. These changes are consistent with right ventricular pacing. - Aortic valve: Poorly visualized. Mildly thickened, mildly calcified leaflets. There may be mild aortic valve stenosis. - Mitral valve: Calcified annulus. - Left atrium: The atrium was mildly dilated. - Right ventricle: Pacer wire or catheter noted in right ventricle.  Assessment and Plan:  1. Symptomatically stable multivessel CAD status post CABG with documented graft disease excluding patent LIMA to LAD. No changes made  present regimen. He is tolerating higher dose Coreg.  2. Ischemic cardiomyopathy with LVEF 35-40%. [employer] Jude biventricular ICD in place without device shocks. Continuing present medical regimen including Lasix at 80 mg twice daily.  3. Chronic systolic heart failure. No major change in weight since last encounter.  4. CKD stage 3, creatinine 1.9 by most recent testing.  5. Peripheral arterial disease test post AAA repair and also left femoral popliteal bypass grafting. Keep follow with Dr. Donnetta Hutching and with Dr. Sammuel Hines.  Current medicines were reviewed with the patient today.   Orders Placed This Encounter  Procedures  . Basic Metabolic Panel (BMET)    Disposition: FU with me in 3 months.  Signed, Satira Sark, MD, [facility] 09/27/2015 4:34 PM    [facility] [address], [place], [place] [address] Phone: [phone]; Fax: [phone]

## 2015-09-30 DIAGNOSIS — I10 Essential (primary) hypertension: Secondary | ICD-10-CM | POA: Diagnosis not present

## 2015-09-30 DIAGNOSIS — Z95828 Presence of other vascular implants and grafts: Secondary | ICD-10-CM | POA: Diagnosis not present

## 2015-09-30 DIAGNOSIS — T82898A Other specified complication of vascular prosthetic devices, implants and grafts, initial encounter: Secondary | ICD-10-CM | POA: Diagnosis not present

## 2015-09-30 DIAGNOSIS — K8689 Other specified diseases of pancreas: Secondary | ICD-10-CM | POA: Diagnosis not present

## 2015-09-30 DIAGNOSIS — I716 Thoracoabdominal aortic aneurysm, without rupture: Secondary | ICD-10-CM | POA: Diagnosis not present

## 2015-09-30 DIAGNOSIS — R932 Abnormal findings on diagnostic imaging of liver and biliary tract: Secondary | ICD-10-CM | POA: Diagnosis not present

## 2015-09-30 DIAGNOSIS — N200 Calculus of kidney: Secondary | ICD-10-CM | POA: Diagnosis not present

## 2015-09-30 DIAGNOSIS — E119 Type 2 diabetes mellitus without complications: Secondary | ICD-10-CM | POA: Diagnosis not present

## 2015-09-30 DIAGNOSIS — I739 Peripheral vascular disease, unspecified: Secondary | ICD-10-CM | POA: Diagnosis not present

## 2015-09-30 DIAGNOSIS — Z09 Encounter for follow-up examination after completed treatment for conditions other than malignant neoplasm: Secondary | ICD-10-CM | POA: Diagnosis not present

## 2015-09-30 DIAGNOSIS — E785 Hyperlipidemia, unspecified: Secondary | ICD-10-CM | POA: Diagnosis not present

## 2015-10-02 ENCOUNTER — Encounter (HOSPITAL_COMMUNITY): Payer: Self-pay | Admitting: *Deleted

## 2015-10-02 ENCOUNTER — Emergency Department (HOSPITAL_COMMUNITY)
Admission: EM | Admit: 2015-10-02 | Discharge: 2015-10-02 | Disposition: A | Discharge status: Home / Self Care | Payer: Medicare Other | Attending: Emergency Medicine | Admitting: Emergency Medicine

## 2015-10-02 ENCOUNTER — Emergency Department (HOSPITAL_COMMUNITY): Payer: Medicare Other

## 2015-10-02 DIAGNOSIS — I714 Abdominal aortic aneurysm, without rupture: Secondary | ICD-10-CM | POA: Insufficient documentation

## 2015-10-02 DIAGNOSIS — I4891 Unspecified atrial fibrillation: Secondary | ICD-10-CM | POA: Diagnosis not present

## 2015-10-02 DIAGNOSIS — I2510 Atherosclerotic heart disease of native coronary artery without angina pectoris: Secondary | ICD-10-CM | POA: Diagnosis not present

## 2015-10-02 DIAGNOSIS — Z7984 Long term (current) use of oral hypoglycemic drugs: Secondary | ICD-10-CM | POA: Diagnosis not present

## 2015-10-02 DIAGNOSIS — W1642XA Fall into unspecified water causing other injury, initial encounter: Secondary | ICD-10-CM | POA: Insufficient documentation

## 2015-10-02 DIAGNOSIS — S92102A Unspecified fracture of left talus, initial encounter for closed fracture: Secondary | ICD-10-CM

## 2015-10-02 DIAGNOSIS — S99922A Unspecified injury of left foot, initial encounter: Secondary | ICD-10-CM | POA: Diagnosis not present

## 2015-10-02 DIAGNOSIS — Y999 Unspecified external cause status: Secondary | ICD-10-CM | POA: Insufficient documentation

## 2015-10-02 DIAGNOSIS — I255 Ischemic cardiomyopathy: Secondary | ICD-10-CM | POA: Insufficient documentation

## 2015-10-02 DIAGNOSIS — Z87891 Personal history of nicotine dependence: Secondary | ICD-10-CM | POA: Insufficient documentation

## 2015-10-02 DIAGNOSIS — Z7982 Long term (current) use of aspirin: Secondary | ICD-10-CM | POA: Insufficient documentation

## 2015-10-02 DIAGNOSIS — E119 Type 2 diabetes mellitus without complications: Secondary | ICD-10-CM | POA: Insufficient documentation

## 2015-10-02 DIAGNOSIS — M1990 Unspecified osteoarthritis, unspecified site: Secondary | ICD-10-CM | POA: Diagnosis not present

## 2015-10-02 DIAGNOSIS — S80922A Unspecified superficial injury of left lower leg, initial encounter: Secondary | ICD-10-CM | POA: Diagnosis present

## 2015-10-02 DIAGNOSIS — S82832A Other fracture of upper and lower end of left fibula, initial encounter for closed fracture: Secondary | ICD-10-CM | POA: Insufficient documentation

## 2015-10-02 DIAGNOSIS — S92152A Displaced avulsion fracture (chip fracture) of left talus, initial encounter for closed fracture: Secondary | ICD-10-CM | POA: Insufficient documentation

## 2015-10-02 DIAGNOSIS — G510 Bell's palsy: Secondary | ICD-10-CM | POA: Diagnosis not present

## 2015-10-02 DIAGNOSIS — Z79899 Other long term (current) drug therapy: Secondary | ICD-10-CM | POA: Insufficient documentation

## 2015-10-02 DIAGNOSIS — S82402A Unspecified fracture of shaft of left fibula, initial encounter for closed fracture: Secondary | ICD-10-CM

## 2015-10-02 DIAGNOSIS — Y929 Unspecified place or not applicable: Secondary | ICD-10-CM | POA: Insufficient documentation

## 2015-10-02 DIAGNOSIS — I10 Essential (primary) hypertension: Secondary | ICD-10-CM | POA: Insufficient documentation

## 2015-10-02 DIAGNOSIS — Y939 Activity, unspecified: Secondary | ICD-10-CM | POA: Insufficient documentation

## 2015-10-02 DIAGNOSIS — I252 Old myocardial infarction: Secondary | ICD-10-CM | POA: Insufficient documentation

## 2015-10-02 DIAGNOSIS — M79672 Pain in left foot: Secondary | ICD-10-CM | POA: Diagnosis not present

## 2015-10-02 MED ORDER — OXYCODONE-ACETAMINOPHEN 5-325 MG PO TABS
1.0000 | ORAL_TABLET | Freq: Once | ORAL | Status: AC
  Administered 2015-10-02: 1 via ORAL
  Filled 2015-10-02: qty 1

## 2015-10-02 MED ORDER — OXYCODONE-ACETAMINOPHEN 5-325 MG PO TABS: 1.0000 | ORAL_TABLET | Freq: Four times a day (QID) | ORAL | Status: DC | PRN

## 2015-10-02 NOTE — ED Notes (Signed)
Pt alert & oriented x4. Patient  given discharge instructions, paperwork & prescription(s). Patient verbalized understanding. Pt left department in wheel chair w/ no further questions. 

## 2015-10-02 NOTE — ED Notes (Signed)
Pt comes in for a fall. Pt states he slipped in a puddle of water. Pt states he landed with his left leg behind him. Pt is having pain in his left foot, left ankle, and left knee. Pt states he drove himself here. Pt denies any loss of consciousness.

## 2015-10-02 NOTE — ED Provider Notes (Signed)
Patient is a 70 year old male, he presents to the hospital complaining of pain in his left knee and ankle and foot after having a slip and fall. He describes having a hyperflexion injury of the knee and the ankle, on exam he has tenderness over the lateral malleolus, the proximal foot and somewhat tenderness over the knee with range of motion though he is able to straight leg raise. Sensation appears to be intact, he is missing the great toe of the left foot and has a chronic peripheral vascular disease.  Imaging pending to rule out fracture, the patient is otherwise well-appearing with no head injuries, no back pain and denies any new focal neurologic complaints  Medical screening examination/treatment/procedure(s) were conducted as a shared visit with non-physician practitioner(s) and myself.  I personally evaluated the patient during the encounter.  Clinical Impression:   Final diagnoses:  Fracture, fibula, left, closed, initial encounter  Talus fracture, left, closed, initial encounter         Noemi Chapel, MD 10/06/15 [phone]

## 2015-10-02 NOTE — Discharge Instructions (Signed)
Read the information below. You have a fracture in the upper portion of your left fibula and in your left ankle. You may ice for 20 minute periods for the next 48 hours. Wear knee immobilizer. No weight bearing activity on left leg - use crutches. You can take tylenol for pain and inflammation relief. Be sure to call and schedule an appointment with Orthopedics for further evaluation and management. You may return to the Emergency Department at any time for worsening condition or any new symptoms that concern you. Return to the ED if you develop fevers, chest pain, shortness of breath, or you develop severe pain, numbness, inability to move toes, or change in color of skin of left lower extremity.

## 2015-10-02 NOTE — ED Provider Notes (Signed)
CSN: [id number]     Arrival date & time 10/02/15  1730 History   First MD Initiated Contact with Patient 10/02/15 1739     Chief Complaint  Patient presents with  . Fall     (Consider location/radiation/quality/duration/timing/severity/associated sxs/prior Treatment) HPI Comments: [name] is a 70 y.o. male with history of PVD, systolic CHF, pacemaker, and T2DM presents to ED with left knee and ankle pain after falling. Patient slipped today on a wet surface, his knee and ankle were hyperflexed. Patient is able to bear weight. Pain is 8-9/10, constant, and worse with movement. Notes swelling at ankle and knee. Some paraesthesia on medial aspect of left lower extremity, below the knee. He tried two tylenol at home with no relief of symptoms.   Patient is a 70 y.o. male presenting with fall. The history is provided by the patient.  Fall This is a new problem. The current episode started today. Associated symptoms include arthralgias, joint swelling and numbness ( chronic diabetic neuropathy). Pertinent negatives include no abdominal pain, chest pain, chills, diaphoresis, fever, headaches, nausea, neck pain or vomiting.    Past Medical History  Diagnosis Date  . Ischemic cardiomyopathy     LVEF 45-50% 11/2011  . Coronary atherosclerosis of native coronary artery     Multivessel status post CABG  . Angioedema     Secondary to ACE inhibitor  . Bell palsy   . Type 2 diabetes mellitus ([place])   . AAA (abdominal aortic aneurysm) ([place])     Followed by Dr. Donnetta Hutching  . Essential hypertension, benign   . PAD (peripheral artery disease) ([place])     Followed by Dr. Donnetta Hutching  . Carotid artery disease ([place])   . Atrial fibrillation ([place])   . Hernia of abdominal wall   . AICD (automatic cardioverter/defibrillator) present   . Nephrolithiasis   . Hypercholesteremia   . Myocardial infarction ([place])   . Migraines   . Arthritis   . Gout   . Sleep apnea    Past Surgical History  Procedure  Laterality Date  . Dupuytren contracture release Left 2009  . Cataract extraction w/phaco  03/23/2011    Procedure: CATARACT EXTRACTION PHACO AND INTRAOCULAR LENS PLACEMENT (IOC);  Surgeon: Tonny Branch;  Location: [facility];  Service: Ophthalmology;  Laterality: Left;  CDE: 15.99  . Cataract extraction w/phaco  04/17/2011    Procedure: CATARACT EXTRACTION PHACO AND INTRAOCULAR LENS PLACEMENT (IOC);  Surgeon: Tonny Branch;  Location: [facility];  Service: Ophthalmology;  Laterality: Right;  CDE: 23.45  . Foot surgery Left 1963    "gangrene"  . Eye surgery    . Bypass graft popliteal to popliteal Left 06/08/2014    Procedure: BYPASS GRAFT FEMORAL ARTERY TO ABOVE KNEE POPLITEAL;  Surgeon: Rosetta Posner, MD;  Location: [facility];  Service: Vascular;  Laterality: Left;  . Abdominal aortic aneurysm repair  November 19, 2013    [facility] :  Dr. Sammuel Hines  . Abdominal aortic aneurysm repair  09-07-2014    [facility]  . Bi-ventricular implantable cardioverter defibrillator  (crt-d)  11/05/2014  . Extracorporeal shock wave lithotripsy  X 1  . Cardiac catheterization N/A 10/02/2014    Procedure: Left Heart Cath and Cors/Grafts Angiography;  Surgeon: Belva Crome, MD;  Location: [place] [facility];  Service: Cardiovascular;  Laterality: N/A;  . Cardiac catheterization  "several"  . Coronary artery bypass graft  2001    LIMA to LAD, SVG to diagonal and ramus, SVG to  OM, SVG to AM  . Ep implantable device N/A 11/05/2014    Procedure: BiV ICD Insertion CRT-D;  Surgeon: Evans Lance, MD;  Location: [place] [facility];  Service: Cardiovascular;  Laterality: N/A;  . Icd lead removal  02/24/2015  . Ep implantable device N/A 02/24/2015    Procedure: Lead Revision/Repair;  Surgeon: Evans Lance, MD;  Location: [place] [facility];  Service: Cardiovascular;  Laterality: N/A;  . Peripheral vascular catheterization N/A 04/28/2015    Procedure: Abdominal Aortogram;  Surgeon: Rosetta Posner, MD;  Location: [place] [facility];   Service: Cardiovascular;  Laterality: N/A;  . Lower extremity angiogram Bilateral 04/28/2015    Procedure: Lower Extremity Angiogram;  Surgeon: Rosetta Posner, MD;  Location: [place] [facility];  Service: Cardiovascular;  Laterality: Bilateral;   Family History  Problem Relation Age of Onset  . Diabetes Mother     Type  I   . Varicose Veins Mother   . Heart disease Father 24    AAA  . AAA (abdominal aortic aneurysm) Father   . Hyperlipidemia Father   . Hypertension Father    Social History  Substance Use Topics  . Smoking status: Former Smoker -- 1.00 packs/day for 25 years    Types: Cigarettes    Start date: 02/28/1979    Quit date: 05/23/2007  . Smokeless tobacco: Never Used  . Alcohol Use: No    Review of Systems  Constitutional: Negative for fever, chills and diaphoresis.  HENT: Negative for trouble swallowing.   Eyes: Negative for visual disturbance.  Respiratory: Negative for shortness of breath.   Cardiovascular: Negative for chest pain.  Gastrointestinal: Negative for nausea, vomiting, abdominal pain, diarrhea and constipation.  Endocrine:       T2DM  Genitourinary: Negative for dysuria and hematuria.  Musculoskeletal: Positive for joint swelling and arthralgias. Negative for back pain and neck pain.  Neurological: Positive for numbness ( chronic diabetic neuropathy). Negative for dizziness, syncope, light-headedness and headaches.      Allergies  Ace inhibitors; Codeine; Lisinopril; and Hydromorphone  Home Medications   Prior to Admission medications   Medication Sig Start Date End Date Taking? Authorizing Provider  acetaminophen (TYLENOL) 500 MG tablet Take 1,000 mg by mouth once as needed for mild pain, moderate pain or headache (pain).    Yes Historical Provider, MD  allopurinol (ZYLOPRIM) 100 MG tablet Take 100 mg by mouth daily with supper.    Yes Historical Provider, MD  amLODipine (NORVASC) 2.5 MG tablet TAKE 1 TABLET ONCE DAILY. 06/09/15  Yes Satira Sark, MD  aspirin EC 81 MG tablet Take 81 mg by mouth daily.   Yes Historical Provider, MD  carvedilol (COREG) 25 MG tablet Take 1 tablet (25 mg total) by mouth 2 (two) times daily. 06/29/15  Yes Satira Sark, MD  furosemide (LASIX) 80 MG tablet Take 1 tablet (80 mg total) by mouth 2 (two) times daily. 06/29/15  Yes Satira Sark, MD  gabapentin (NEURONTIN) 300 MG capsule Take 300 mg by mouth 2 (two) times daily.   Yes Historical Provider, MD  glipiZIDE (GLUCOTROL XL) 10 MG 24 hr tablet Take 10 mg by mouth 2 (two) times daily.  05/08/14  Yes Historical Provider, MD  isosorbide mononitrate (IMDUR) 60 MG 24 hr tablet Take 30-120 mg by mouth 2 (two) times daily. 120 mg am, 30 mg pm   Yes Historical Provider, MD  linagliptin (TRADJENTA) 5 MG TABS tablet Take 5 mg by mouth  daily.     Yes Historical Provider, MD  nitroGLYCERIN (NITROSTAT) 0.4 MG SL tablet PLACE 0.4 MG  UNDER THE TONGUE EVERY 5 MINUTES UP TO 3 DOSES AS NEEDED FOR CHEST PAIN. 03/12/15  Yes Satira Sark, MD  potassium chloride (K-DUR) 10 MEQ tablet TAKE 2 TABLETS BY MOUTH ONCE DAILY. 06/09/15  Yes Satira Sark, MD  simvastatin (ZOCOR) 20 MG tablet TAKE 1 TABLET BY MOUTH AT BEDTIME. 06/28/15  Yes Satira Sark, MD  oxyCODONE-acetaminophen (PERCOCET/ROXICET) 5-325 MG tablet Take 1 tablet by mouth every 6 (six) hours as needed for severe pain. 10/02/15   Roxanna Mew, PA-C   BP 116/58 mmHg  Pulse 77  Temp(Src) 98.1 F (36.7 C) (Oral)  Resp 17  Ht 6\' 1"  (1.854 m)  Wt 111.131 kg  BMI 32.33 kg/m2  SpO2 98% Physical Exam  Constitutional: He appears well-developed and well-nourished. No distress.  HENT:  Head: Normocephalic and atraumatic.  Mouth/Throat: Oropharynx is clear and moist. No oropharyngeal exudate.  Eyes: Conjunctivae and EOM are normal. Pupils are equal, round, and reactive to light. Right eye exhibits no discharge. Left eye exhibits no discharge. No scleral icterus.  Neck: Normal range of motion.  Neck supple.  Cardiovascular: Normal rate, regular rhythm, normal heart sounds and intact distal pulses.   No murmur heard. Capillary refill <3 seconds b/l. 2+ DP pulses b/l.   Pulmonary/Chest: Effort normal and breath sounds normal. No respiratory distress.  Abdominal: Soft. Bowel sounds are normal.  Musculoskeletal:  Amputated 1st left phalanx. Mild swelling noted of left ankle. No warmth or erythema. TTP of left lateral ankle and proximal metatarsals. Decreased ROM of left ankle secondary to pain. Amputated left 1st phalanx.   No TTP of left knee. Patella stable. Slight decrease in knee flexion secondary to pain. Pain noted with active ROM.   Patient able to straight leg raise.   Lymphadenopathy:    He has no cervical adenopathy.  Neurological: He is alert.  Decrease sensation on medial aspect of left lower extremity, below the knee. Unsure if this is new or chronic in nature secondary to diabetic neuropathy.   Skin: Skin is warm and dry. He is not diaphoretic.  Psychiatric: He has a normal mood and affect. His behavior is normal.    ED Course  Procedures (including critical care time) Labs Review Labs Reviewed - No data to display  Imaging Review Dg Ankle Complete Left  10/02/2015  CLINICAL DATA:  [place] today with lateral ankle pain EXAM: LEFT ANKLE COMPLETE - 3+ VIEW COMPARISON:  None. FINDINGS: There is nonspecific lateral soft tissue swelling. No evidence of fracture or dislocation at the ankle joint itself. There is an avulsion fracture at the dorsal corner of the anterior process of the talus consistent with capsular avulsion. IMPRESSION: Avulsion fracture of the dorsal corner of the anterior process of the talus at the site of capsular insertion. Electronically Signed   By: Nelson Chimes M.D.   On: 10/02/2015 19:21   Dg Knee Complete 4 Views Left  10/02/2015  CLINICAL DATA:  [place], pain left knee at lateral side, pain left foot EXAM: LEFT KNEE - COMPLETE 4+ VIEW COMPARISON:   06/15/2014 FINDINGS: There is a subtle minimally displaced fracture involving the proximal aspect of the fibula. No significant joint effusion identified. Degenerative changes are identified at the patellofemoral compartment. There are vascular clips in the region of the popliteal artery. 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 Based on comparison prior study, patient had  prior femoral-popliteal graft for thrombosed popliteal artery aneurysm. IMPRESSION: 1. Nondisplaced fracture of the proximal aspect of the fibula. 2. Femoral popliteal bypass graft and popliteal artery aneurysm as described above. 3. Recommend dedicated views of the tibia fibula and of the ankle for further evaluation. Electronically Signed   By: Nolon Nations M.D.   On: 10/02/2015 18:45   Dg Foot Complete Left  10/02/2015  CLINICAL DATA:  Fall.  Pain in the left knee.  Pain in left foot. EXAM: LEFT FOOT - COMPLETE 3+ VIEW COMPARISON:  None. FINDINGS: Status post amputation of the great toe at the level of the mid proximal phalanx. There is no evidence for acute fracture or subluxation. Vascular clips are identified at the ankle. Small plantar and Achilles spurs are present. IMPRESSION: No evidence for acute  abnormality. Electronically Signed   By: Nolon Nations M.D.   On: 10/02/2015 18:48   I have personally reviewed and evaluated these images and lab results as part of my medical decision-making. Fracture noted of left medial proximal fibula.  In line with radiology review.    EKG Interpretation None      MDM   Final diagnoses:  Fracture, fibula, left, closed, initial encounter  Talus fracture, left, closed, initial encounter    Aidric M Cancelliere is a 70 y.o. male with history of PVD, systolic HF, pacemaker, and T2DM presents to ED with left knee and ankle pain following a fall. Patient is afebrile and non-toxic appearing. Vital signs are stable. Mild swelling of left knee noted. No TTP, warmth, or erythema.  Patella stabl. Mild decrease in ROM secondary to pain. Swelling of left ankle with TTP, decrease ROM and strength secondary to pain. Capillary refill <3 sec.   Radiologic exams reveal a proximal left fibula fracture and avulsion fracture of left talus. Patient placed in knee immobilizer and provided crutches. Advised patient to be non-weight bearing until follow up with orthopedics. Referral to orthopedics provided. Rx for pain control. Discussed return precautions. Patient voiced understanding and is agreeable.     Roxanna Mew, PA-C 10/02/15 [place], MD 10/06/15 [phone]

## 2015-10-04 ENCOUNTER — Ambulatory Visit (INDEPENDENT_AMBULATORY_CARE_PROVIDER_SITE_OTHER): Payer: Medicare Other | Admitting: Orthopedic Surgery

## 2015-10-04 VITALS — BP 133/57 | HR 70 | Ht 73.0 in | Wt 245.0 lb

## 2015-10-04 DIAGNOSIS — S99912A Unspecified injury of left ankle, initial encounter: Secondary | ICD-10-CM | POA: Diagnosis not present

## 2015-10-04 DIAGNOSIS — I255 Ischemic cardiomyopathy: Secondary | ICD-10-CM

## 2015-10-04 DIAGNOSIS — S82832A Other fracture of upper and lower end of left fibula, initial encounter for closed fracture: Secondary | ICD-10-CM | POA: Diagnosis not present

## 2015-10-04 DIAGNOSIS — S92152A Displaced avulsion fracture (chip fracture) of left talus, initial encounter for closed fracture: Secondary | ICD-10-CM | POA: Diagnosis not present

## 2015-10-04 MED ORDER — OXYCODONE-ACETAMINOPHEN 5-325 MG PO TABS: 1.0000 | ORAL_TABLET | Freq: Four times a day (QID) | ORAL | Status: DC | PRN

## 2015-10-04 NOTE — Progress Notes (Addendum)
Chief Complaint  Patient presents with  . Ankle Injury    fractured ankle and fiblua. DOI:10/02/15   HPI   Left ankle pain  Location left ankle quality dull severity moderate duration several days timing constant context worse with weightbearing  Review of Systems  Constitutional: Negative for fever and chills.  Neurological: Negative for tingling.    Past Medical History  Diagnosis Date  . Ischemic cardiomyopathy     LVEF 45-50% 11/2011  . Coronary atherosclerosis of native coronary artery     Multivessel status post CABG  . Angioedema     Secondary to ACE inhibitor  . Bell palsy   . Type 2 diabetes mellitus ([place])   . AAA (abdominal aortic aneurysm) ([place])     Followed by Dr. Donnetta Hutching  . Essential hypertension, benign   . PAD (peripheral artery disease) ([place])     Followed by Dr. Donnetta Hutching  . Carotid artery disease ([place])   . Atrial fibrillation ([place])   . Hernia of abdominal wall   . AICD (automatic cardioverter/defibrillator) present   . Nephrolithiasis   . Hypercholesteremia   . Myocardial infarction ([place])   . Migraines   . Arthritis   . Gout   . Sleep apnea     Past Surgical History  Procedure Laterality Date  . Dupuytren contracture release Left 2009  . Cataract extraction w/phaco  03/23/2011    Procedure: CATARACT EXTRACTION PHACO AND INTRAOCULAR LENS PLACEMENT (IOC);  Surgeon: Tonny Branch;  Location: [facility];  Service: Ophthalmology;  Laterality: Left;  CDE: 15.99  . Cataract extraction w/phaco  04/17/2011    Procedure: CATARACT EXTRACTION PHACO AND INTRAOCULAR LENS PLACEMENT (IOC);  Surgeon: Tonny Branch;  Location: [facility];  Service: Ophthalmology;  Laterality: Right;  CDE: 23.45  . Foot surgery Left 1963    "gangrene"  . Eye surgery    . Bypass graft popliteal to popliteal Left 06/08/2014    Procedure: BYPASS GRAFT FEMORAL ARTERY TO ABOVE KNEE POPLITEAL;  Surgeon: Rosetta Posner, MD;  Location: [facility];  Service: Vascular;  Laterality: Left;  . Abdominal aortic aneurysm  repair  November 19, 2013    [facility] :  Dr. Sammuel Hines  . Abdominal aortic aneurysm repair  09-07-2014    [facility]  . Bi-ventricular implantable cardioverter defibrillator  (crt-d)  11/05/2014  . Extracorporeal shock wave lithotripsy  X 1  . Cardiac catheterization N/A 10/02/2014    Procedure: Left Heart Cath and Cors/Grafts Angiography;  Surgeon: Belva Crome, MD;  Location: [place] [facility];  Service: Cardiovascular;  Laterality: N/A;  . Cardiac catheterization  "several"  . Coronary artery bypass graft  2001    LIMA to LAD, SVG to diagonal and ramus, SVG to OM, SVG to AM  . Ep implantable device N/A 11/05/2014    Procedure: BiV ICD Insertion CRT-D;  Surgeon: Evans Lance, MD;  Location: [place] [facility];  Service: Cardiovascular;  Laterality: N/A;  . Icd lead removal  02/24/2015  . Ep implantable device N/A 02/24/2015    Procedure: Lead Revision/Repair;  Surgeon: Evans Lance, MD;  Location: [place] [facility];  Service: Cardiovascular;  Laterality: N/A;  . Peripheral vascular catheterization N/A 04/28/2015    Procedure: Abdominal Aortogram;  Surgeon: Rosetta Posner, MD;  Location: [place] [facility];  Service: Cardiovascular;  Laterality: N/A;  . Lower extremity angiogram Bilateral 04/28/2015    Procedure: Lower Extremity Angiogram;  Surgeon: Rosetta Posner, MD;  Location: [place]  [facility];  Service: Cardiovascular;  Laterality: Bilateral;   Family History  Problem Relation Age of Onset  . Diabetes Mother     Type  I   . Varicose Veins Mother   . Heart disease Father 62    AAA  . AAA (abdominal aortic aneurysm) Father   . Hyperlipidemia Father   . Hypertension Father    Social History  Substance Use Topics  . Smoking status: Former Smoker -- 1.00 packs/day for 25 years    Types: Cigarettes    Start date: 02/28/1979    Quit date: 05/23/2007  . Smokeless tobacco: Never Used  . Alcohol Use: No    Current outpatient prescriptions:  .  acetaminophen (TYLENOL) 500 MG  tablet, Take 1,000 mg by mouth once as needed for mild pain, moderate pain or headache (pain). , Disp: , Rfl:  .  allopurinol (ZYLOPRIM) 100 MG tablet, Take 100 mg by mouth daily with supper. , Disp: , Rfl:  .  amLODipine (NORVASC) 2.5 MG tablet, TAKE 1 TABLET ONCE DAILY., Disp: 30 tablet, Rfl: 3 .  aspirin EC 81 MG tablet, Take 81 mg by mouth daily., Disp: , Rfl:  .  carvedilol (COREG) 25 MG tablet, Take 1 tablet (25 mg total) by mouth 2 (two) times daily., Disp: 180 tablet, Rfl: 3 .  furosemide (LASIX) 80 MG tablet, Take 1 tablet (80 mg total) by mouth 2 (two) times daily., Disp: 180 tablet, Rfl: 3 .  gabapentin (NEURONTIN) 300 MG capsule, Take 300 mg by mouth 2 (two) times daily., Disp: , Rfl:  .  glipiZIDE (GLUCOTROL XL) 10 MG 24 hr tablet, Take 10 mg by mouth 2 (two) times daily. , Disp: , Rfl:  .  isosorbide mononitrate (IMDUR) 60 MG 24 hr tablet, Take 30-120 mg by mouth 2 (two) times daily. 120 mg am, 30 mg pm, Disp: , Rfl:  .  linagliptin (TRADJENTA) 5 MG TABS tablet, Take 5 mg by mouth daily.  , Disp: , Rfl:  .  nitroGLYCERIN (NITROSTAT) 0.4 MG SL tablet, PLACE 0.4 MG  UNDER THE TONGUE EVERY 5 MINUTES UP TO 3 DOSES AS NEEDED FOR CHEST PAIN., Disp: 25 tablet, Rfl: 3 .  oxyCODONE-acetaminophen (PERCOCET/ROXICET) 5-325 MG tablet, Take 1 tablet by mouth every 6 (six) hours as needed for severe pain., Disp: 30 tablet, Rfl: 0 .  potassium chloride (K-DUR) 10 MEQ tablet, TAKE 2 TABLETS BY MOUTH ONCE DAILY., Disp: 60 tablet, Rfl: 3 .  simvastatin (ZOCOR) 20 MG tablet, TAKE 1 TABLET BY MOUTH AT BEDTIME., Disp: 30 tablet, Rfl: 3  BP 133/57 mmHg  Pulse 70  Ht 6\' 1"  (1.854 m)  Wt 245 lb (111.131 kg)  BMI 32.33 kg/m2  Physical Exam  Constitutional: He is oriented to person, place, and time. He appears well-developed and well-nourished. No distress.  Cardiovascular: Normal rate and intact distal pulses.   Neurological: He is alert and oriented to person, place, and time.  Skin: Skin is warm and  dry. No rash noted. He is not diaphoretic. No erythema. No pallor.  Psychiatric: He has a normal mood and affect. His behavior is normal. Judgment and thought content normal.    Ortho Exam Gait crutches partial weight bearing   Left knee is tenderness over the proximal fibula small effusion of the knee joint.  Left ankle and foot Tenderness over the lateral anterior talofibular ligament swelling of the ankle joint tenderness over the dorsum of the foot near the talar avulsion fracture. He is ambulatory with  a set of crutches and a knee immobilizer nothing on the foot  Ankle is stable  muscle tone is normal  skin is red and swollen around the ankle joint No atrophy. Pulse and temperature normal. No edema. Sensation normal. He has a right great toe amputation  Right foot normal range of motion stability strength appearance no swelling   ASSESSMENT: My personal interpretation of the images:  Avulsion fracture dorsal talus Fibular fracture is seen no knee fracture    PLAN Ankle sprain Dorsal talar avulsion Fibular fracture  No fracture care initiated here mainly soft tissue injury fibular fracture can be seen as a visit to visit basis  [place] weightbearing as tolerated left foot continue crutches 4 weeks   no x-rays needed next visit

## 2015-10-07 ENCOUNTER — Other Ambulatory Visit: Payer: Self-pay | Admitting: Cardiology

## 2015-10-25 DIAGNOSIS — T82390A Other mechanical complication of aortic (bifurcation) graft (replacement), initial encounter: Secondary | ICD-10-CM | POA: Diagnosis not present

## 2015-10-25 DIAGNOSIS — E119 Type 2 diabetes mellitus without complications: Secondary | ICD-10-CM | POA: Diagnosis not present

## 2015-10-25 DIAGNOSIS — G4733 Obstructive sleep apnea (adult) (pediatric): Secondary | ICD-10-CM | POA: Diagnosis not present

## 2015-10-25 DIAGNOSIS — Z885 Allergy status to narcotic agent status: Secondary | ICD-10-CM | POA: Diagnosis not present

## 2015-10-25 DIAGNOSIS — T82898A Other specified complication of vascular prosthetic devices, implants and grafts, initial encounter: Secondary | ICD-10-CM | POA: Diagnosis not present

## 2015-10-25 DIAGNOSIS — I10 Essential (primary) hypertension: Secondary | ICD-10-CM | POA: Diagnosis not present

## 2015-10-25 DIAGNOSIS — I7389 Other specified peripheral vascular diseases: Secondary | ICD-10-CM | POA: Diagnosis not present

## 2015-10-25 DIAGNOSIS — I252 Old myocardial infarction: Secondary | ICD-10-CM | POA: Diagnosis not present

## 2015-10-25 DIAGNOSIS — I4891 Unspecified atrial fibrillation: Secondary | ICD-10-CM | POA: Diagnosis not present

## 2015-10-27 NOTE — Addendum Note (Signed)
Addended by: Thresa Ross C on: 10/27/2015 11:34 AM   Modules accepted: Orders

## 2015-10-29 ENCOUNTER — Other Ambulatory Visit: Payer: Self-pay | Admitting: Cardiology

## 2015-11-01 ENCOUNTER — Ambulatory Visit (INDEPENDENT_AMBULATORY_CARE_PROVIDER_SITE_OTHER): Payer: Medicare Other | Admitting: Orthopedic Surgery

## 2015-11-01 ENCOUNTER — Encounter: Payer: Self-pay | Admitting: Orthopedic Surgery

## 2015-11-01 ENCOUNTER — Ambulatory Visit (INDEPENDENT_AMBULATORY_CARE_PROVIDER_SITE_OTHER): Payer: Medicare Other

## 2015-11-01 VITALS — BP 172/71 | Ht 73.0 in | Wt 245.0 lb

## 2015-11-01 DIAGNOSIS — S83411A Sprain of medial collateral ligament of right knee, initial encounter: Secondary | ICD-10-CM

## 2015-11-01 DIAGNOSIS — M25562 Pain in left knee: Secondary | ICD-10-CM

## 2015-11-01 DIAGNOSIS — S99912D Unspecified injury of left ankle, subsequent encounter: Secondary | ICD-10-CM

## 2015-11-01 DIAGNOSIS — I255 Ischemic cardiomyopathy: Secondary | ICD-10-CM

## 2015-11-01 NOTE — Progress Notes (Signed)
Patient ID: EUGEAN STAKE, male   DOB: [birth year], 70 y.o.   MRN: [id number]  Chief Complaint  Patient presents with  . Follow-up    Left ankle sprain, Left talar avulsion, Left fibular fracture    HPI 70 year old male injured his left ankle was placed in a boot for an avulsion fracture and an ankle sprain. He presents for reevaluation of that injury with a new injury of pain swelling left knee fell on Saturday. Complains of severe left knee pain with swelling and stiffness. No catching locking or giving way.  Review of Systems  Constitutional: Negative for fever and chills.  Musculoskeletal: Positive for joint pain.  Neurological: Negative for tingling.    Past Medical History  Diagnosis Date  . Ischemic cardiomyopathy     LVEF 45-50% 11/2011  . Coronary atherosclerosis of native coronary artery     Multivessel status post CABG  . Angioedema     Secondary to ACE inhibitor  . Bell palsy   . Type 2 diabetes mellitus ([place])   . AAA (abdominal aortic aneurysm) ([place])     Followed by Dr. Donnetta Hutching  . Essential hypertension, benign   . PAD (peripheral artery disease) ([place])     Followed by Dr. Donnetta Hutching  . Carotid artery disease ([place])   . Atrial fibrillation ([place])   . Hernia of abdominal wall   . AICD (automatic cardioverter/defibrillator) present   . Nephrolithiasis   . Hypercholesteremia   . Myocardial infarction ([place])   . Migraines   . Arthritis   . Gout   . Sleep apnea     BP 172/71 mmHg  Ht 6\' 1"  (1.854 m)  Wt 245 lb (111.131 kg)  BMI 32.33 kg/m2  Physical Exam Physical Exam  Constitutional: The patient appears well-developed and well-nourished. No distress.  The patient is oriented to person, place, and time.  Psychiatric: The patient has a normal mood and affect.  Cardiovascular: Intact distal pulses.   Neurological: sensation is normal  Skin: Skin is warm and dry. No rash noted. The patient is not diaphoretic. No erythema. No pallor.    Ortho Exam  Left knee   Medial and lateral joint line tenderness but more medial MCL and medial joint line, lateral tenderness over the fibular head knee flexion 110 knee feels stable coronal and sagittal plane muscle tone normal skin is intact   Left ankle he does have some mild tenderness over the left ankle in the lateral side his range of motion is approximately 30 10 dorsiflexion 20 plantarflexion it's stable has good plantar flexion strength skin is normal has some mild edema that is chronic he has normal sensation there.  ASSESSMENT AND PLAN   Order new x-ray left knee for new left knee pain X-ray shows mild/moderate arthritis medial compartment no acute fracture  Left ankle sprain Left knee diagnosis MCL sprain versus torn medial meniscus Plan for left knee Brace, ice, will give him something for pain   Plan for left ankle remove brace  Patient will follow-up regarding left knee for reevaluation 4 weeks Arther Abbott, MD 11/01/2015 1:46 PM

## 2015-11-01 NOTE — Patient Instructions (Signed)
Brace left knee ice as needed return in 4 weeks

## 2015-11-04 IMAGING — US US EXTREM LOW VENOUS*L*
1 series · 13 of 24 positions shown · non-contrast
Comparison: None.

CLINICAL DATA: Edema.



[Series 1: us extrem low venous*left* · 0.08mm/px · 13 of 42 slices shown]
[im 1/42]
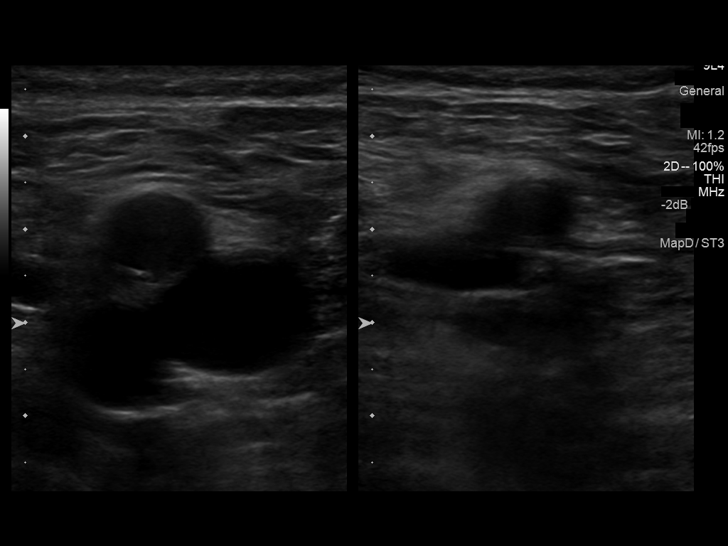
[im 4/42]
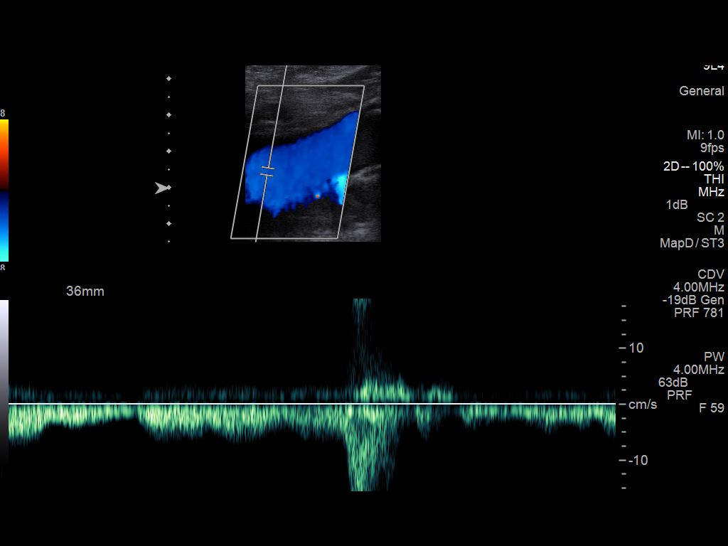
[im 8/42]
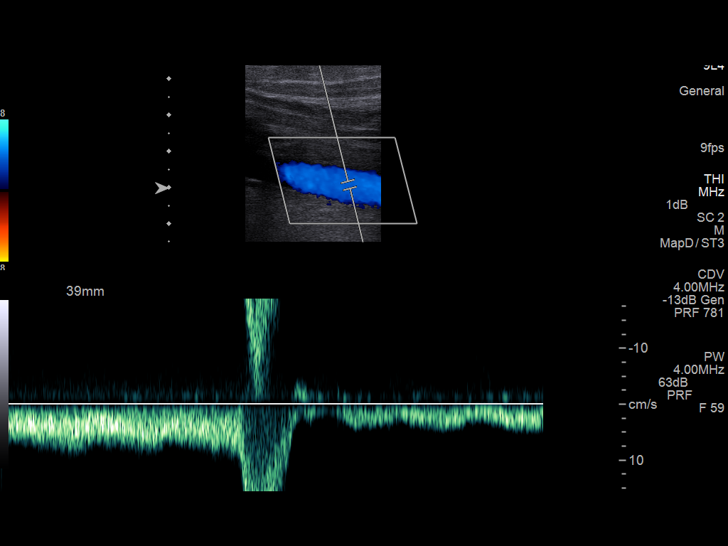
[im 11/42]
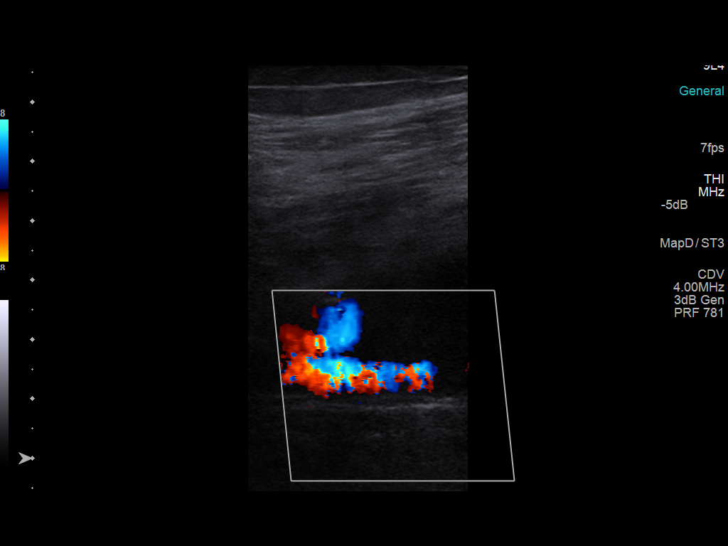
[im 15/42]
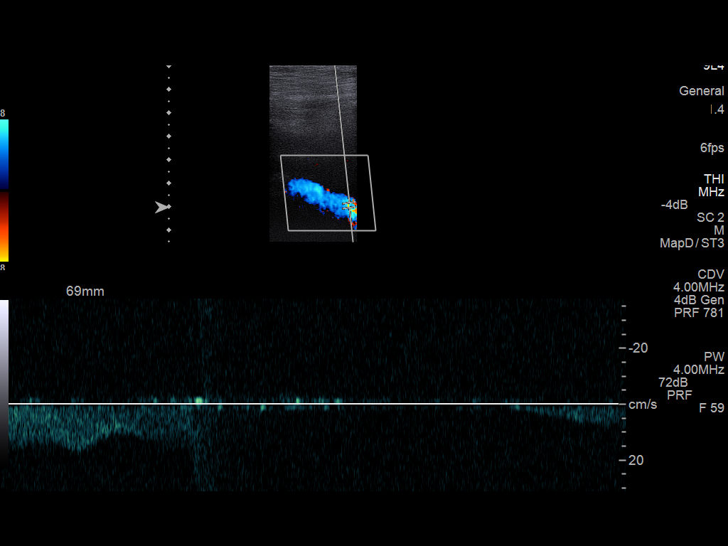
[im 18/42]
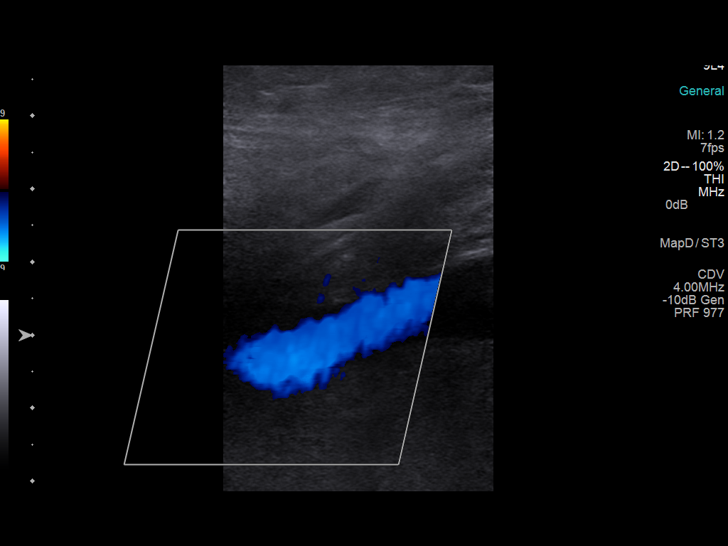
[im 22/42]
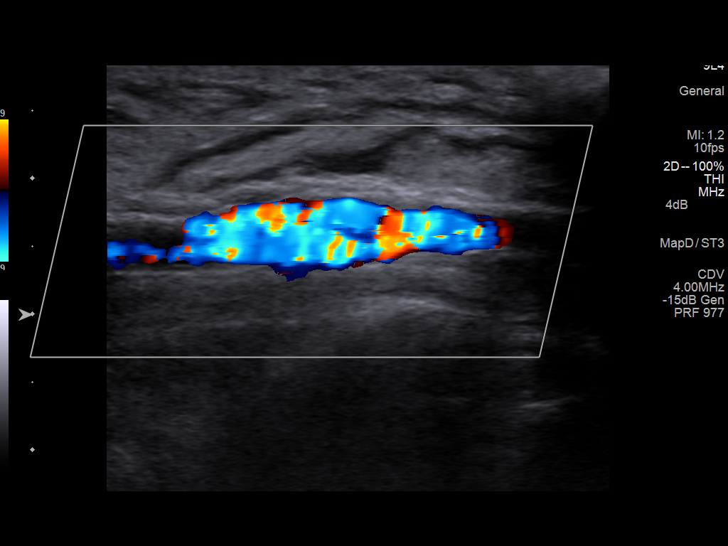
[im 24/42]
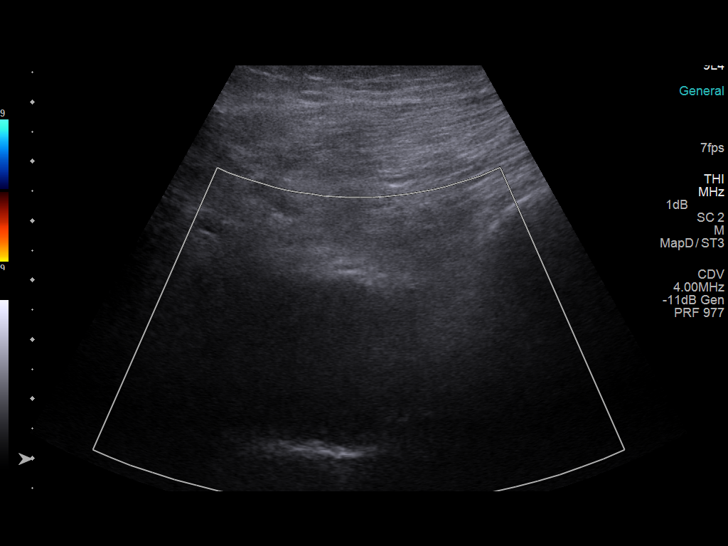
[im 27/42]
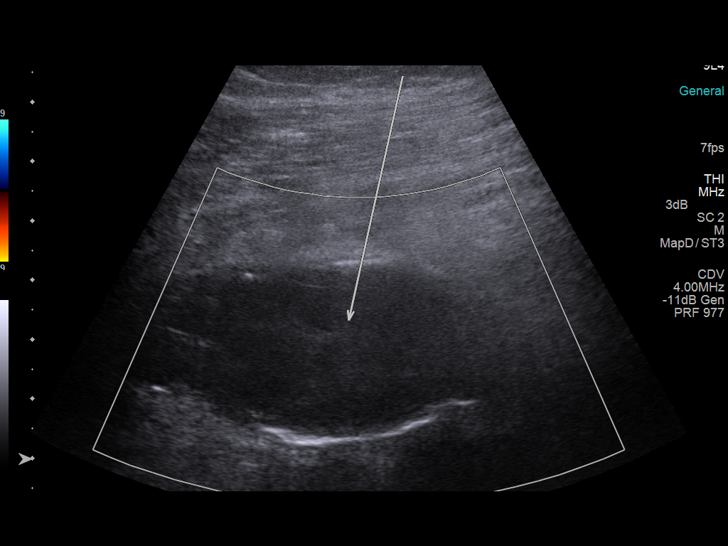
[im 31/42]
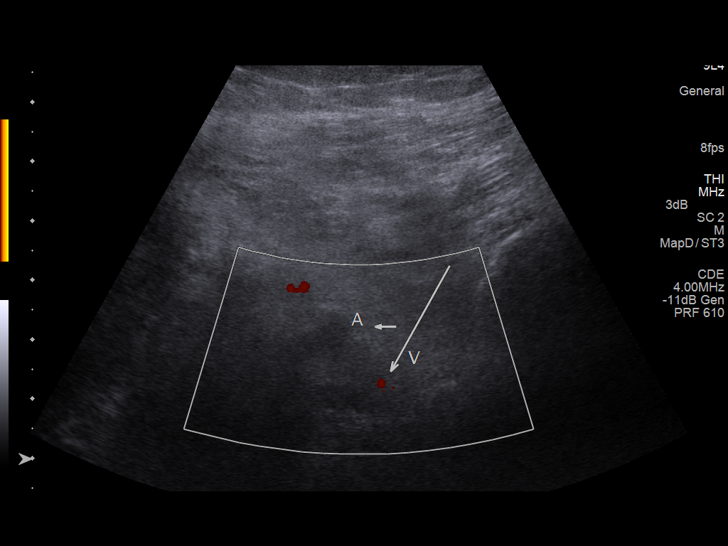
[im 34/42]
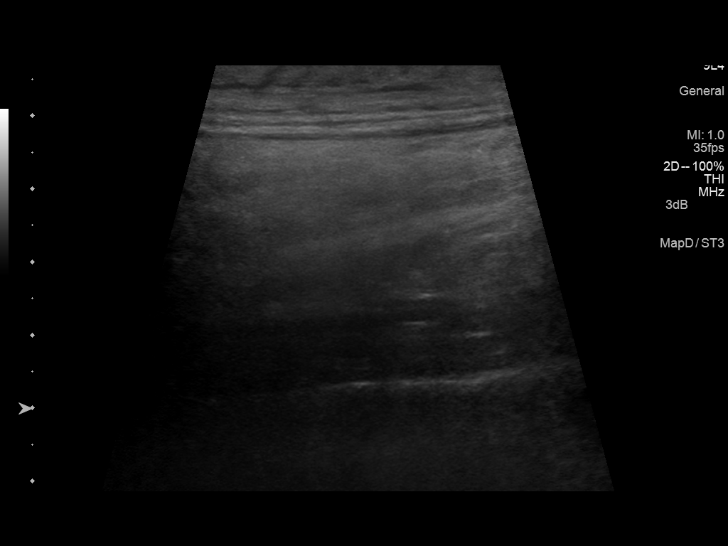
[im 38/42]
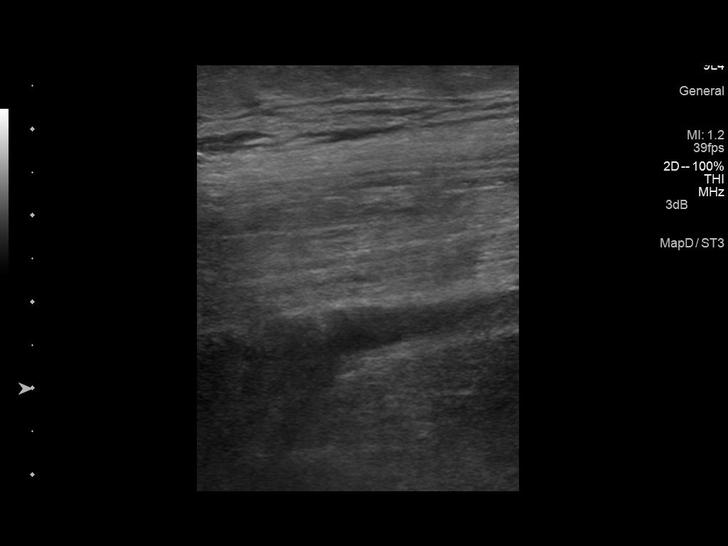
[im 42/42]
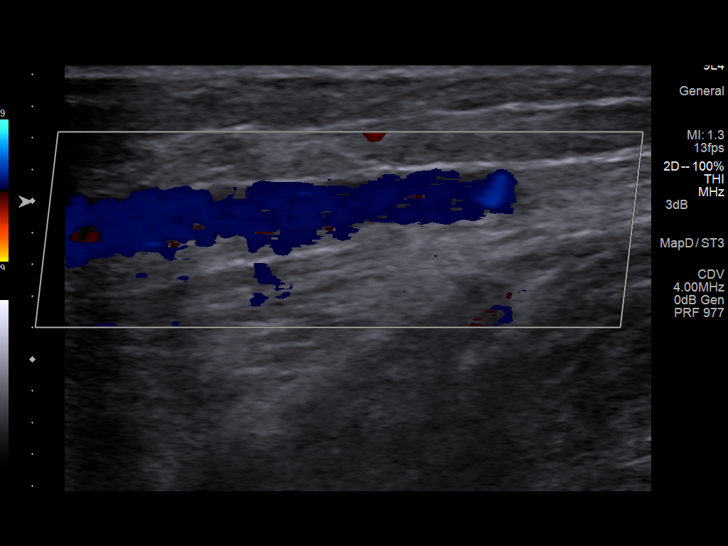

[13 of 24 positions shown; findings below may reference images not displayed]

FINDINGS: Contralateral Common Femoral Vein: Respiratory phasicity is normal
and symmetric with the symptomatic side. No evidence of thrombus.
Normal compressibility.

Common Femoral Vein: No evidence of thrombus. Normal
compressibility, respiratory phasicity and response to augmentation.

Saphenofemoral Junction: No evidence of thrombus. Normal
compressibility and flow on color Doppler imaging.

Profunda Femoral Vein: No evidence of thrombus. Normal
compressibility and flow on color Doppler imaging.

Femoral Vein: No evidence of thrombus. Normal compressibility,
respiratory phasicity and response to augmentation.

Popliteal Vein: No evidence of thrombus. Normal compressibility,
respiratory phasicity and response to augmentation.

Calf Veins: No evidence of thrombus. Normal compressibility and flow
on color Doppler imaging.

Superficial Great Saphenous Vein: No evidence of thrombus. Normal
compressibility and flow on color Doppler imaging.

Venous Reflux:  None.

Other Findings: Exam was limited by postsurgical changes in the left
lower extremity. Patient status post femoral-popliteal graft for
popliteal artery aneurysm. Popliteal artery aneurysm was identified
appears thrombosed.
IMPRESSION: Limited exam.  No evidence of deep venous thrombosis.

## 2015-11-08 ENCOUNTER — Ambulatory Visit (INDEPENDENT_AMBULATORY_CARE_PROVIDER_SITE_OTHER): Payer: Medicare Other | Admitting: *Deleted

## 2015-11-08 DIAGNOSIS — I255 Ischemic cardiomyopathy: Secondary | ICD-10-CM | POA: Diagnosis not present

## 2015-11-08 NOTE — Progress Notes (Signed)
Remote ICD transmission.   

## 2015-11-09 LAB — CUP PACEART REMOTE DEVICE CHECK
Battery Voltage: 2.98 V
Brady Statistic AP VP Percent: 15 %
Brady Statistic AP VS Percent: 1 %
Brady Statistic AS VP Percent: 81 %
Brady Statistic AS VS Percent: 2.1 %
Brady Statistic RA Percent Paced: 13 %
HIGH POWER IMPEDANCE MEASURED VALUE: 64 Ohm
HighPow Impedance: 64 Ohm
Implantable Lead Implant Date: 20161005
Implantable Lead Location: 753859
Lead Channel Impedance Value: 530 Ohm
Lead Channel Pacing Threshold Amplitude: 0.625 V
Lead Channel Pacing Threshold Amplitude: 1.875 V
Lead Channel Pacing Threshold Pulse Width: 0.5 ms
Lead Channel Pacing Threshold Pulse Width: 1 ms
Lead Channel Sensing Intrinsic Amplitude: 2 mV
Lead Channel Setting Pacing Amplitude: 1.75 V
Lead Channel Setting Pacing Amplitude: 2 V
Lead Channel Setting Pacing Pulse Width: 0.5 ms
Lead Channel Setting Sensing Sensitivity: 0.5 mV
MDC IDC LEAD IMPLANT DT: 20160616
MDC IDC LEAD IMPLANT DT: 20161005
MDC IDC LEAD LOCATION: 753858
MDC IDC LEAD LOCATION: 753860
MDC IDC LEAD MODEL: 7122
MDC IDC MSMT BATTERY REMAINING LONGEVITY: 55 mo
MDC IDC MSMT BATTERY REMAINING PERCENTAGE: 82 %
MDC IDC MSMT LEADCHNL LV IMPEDANCE VALUE: 700 Ohm
MDC IDC MSMT LEADCHNL RA PACING THRESHOLD AMPLITUDE: 0.75 V
MDC IDC MSMT LEADCHNL RA PACING THRESHOLD PULSEWIDTH: 0.5 ms
MDC IDC MSMT LEADCHNL RV IMPEDANCE VALUE: 410 Ohm
MDC IDC MSMT LEADCHNL RV SENSING INTR AMPL: 8.8 mV
MDC IDC PG SERIAL: 7288352
MDC IDC SESS DTM: 20170619060016
MDC IDC SET LEADCHNL LV PACING AMPLITUDE: 2.875
MDC IDC SET LEADCHNL LV PACING PULSEWIDTH: 1 ms

## 2015-11-12 ENCOUNTER — Encounter: Payer: Self-pay | Admitting: Cardiology

## 2015-11-18 DIAGNOSIS — I743 Embolism and thrombosis of arteries of the lower extremities: Secondary | ICD-10-CM | POA: Diagnosis not present

## 2015-11-18 DIAGNOSIS — I739 Peripheral vascular disease, unspecified: Secondary | ICD-10-CM | POA: Diagnosis not present

## 2015-11-18 DIAGNOSIS — I714 Abdominal aortic aneurysm, without rupture: Secondary | ICD-10-CM | POA: Diagnosis not present

## 2015-11-18 DIAGNOSIS — Z95828 Presence of other vascular implants and grafts: Secondary | ICD-10-CM | POA: Diagnosis not present

## 2015-11-26 ENCOUNTER — Encounter: Payer: Self-pay | Admitting: Cardiology

## 2015-12-06 ENCOUNTER — Ambulatory Visit (INDEPENDENT_AMBULATORY_CARE_PROVIDER_SITE_OTHER): Payer: Medicare Other | Admitting: Orthopedic Surgery

## 2015-12-06 ENCOUNTER — Encounter: Payer: Self-pay | Admitting: Orthopedic Surgery

## 2015-12-06 VITALS — BP 120/57 | Ht 72.0 in | Wt 252.0 lb

## 2015-12-06 DIAGNOSIS — S83411D Sprain of medial collateral ligament of right knee, subsequent encounter: Secondary | ICD-10-CM

## 2015-12-06 DIAGNOSIS — M25562 Pain in left knee: Secondary | ICD-10-CM

## 2015-12-06 DIAGNOSIS — I255 Ischemic cardiomyopathy: Secondary | ICD-10-CM | POA: Diagnosis not present

## 2015-12-06 NOTE — Progress Notes (Signed)
Patient ID: [name], male   DOB: [birth year], 70 y.o.   MRN: [id number]  Chief Complaint  Patient presents with  . Follow-up    follow up Left knee diagnosis MCL sprain versus torn medial meniscus    HPI 70 year old male renal injury left knee onto have MCL sprain follows up complaining of medial pain  ROS  No ankle pain at this time, no swelling of the left knee  BP 120/57 mmHg  Ht 6' (1.829 m)  Wt 252 lb (114.306 kg)  BMI 34.17 kg/m2 Gen. appearance is normal grooming and hygiene Orientation to person place and time normal Mood normal Gait is normal slightly altered slight limp  No peripheral edema or swelling is noted in the left leg Sensory exam shows normal sensation to palpation, pressure and soft touch Skin exam no lacerations ulcerations or erythema  Ortho Exam  Tenderness over the MCL medial joint line medial femoral condyle small flexion contracture less than 10 flexion of the knees 115 MCL remain stable meniscal sign of McMurray negative A/P  Medical decision-making  MCL sprain and should heal on its own follow-up as needed and continue brace call if no improvement after 4 weeks  Arther Abbott, MD 12/06/2015 9:32 AM

## 2015-12-10 DIAGNOSIS — I714 Abdominal aortic aneurysm, without rupture: Secondary | ICD-10-CM | POA: Diagnosis not present

## 2015-12-10 DIAGNOSIS — N184 Chronic kidney disease, stage 4 (severe): Secondary | ICD-10-CM | POA: Diagnosis not present

## 2015-12-10 DIAGNOSIS — E119 Type 2 diabetes mellitus without complications: Secondary | ICD-10-CM | POA: Diagnosis not present

## 2015-12-10 DIAGNOSIS — R739 Hyperglycemia, unspecified: Secondary | ICD-10-CM | POA: Diagnosis not present

## 2015-12-10 DIAGNOSIS — E785 Hyperlipidemia, unspecified: Secondary | ICD-10-CM | POA: Diagnosis not present

## 2015-12-10 DIAGNOSIS — N200 Calculus of kidney: Secondary | ICD-10-CM | POA: Diagnosis not present

## 2015-12-10 DIAGNOSIS — I509 Heart failure, unspecified: Secondary | ICD-10-CM | POA: Diagnosis not present

## 2015-12-10 DIAGNOSIS — I10 Essential (primary) hypertension: Secondary | ICD-10-CM | POA: Diagnosis not present

## 2015-12-10 DIAGNOSIS — M109 Gout, unspecified: Secondary | ICD-10-CM | POA: Diagnosis not present

## 2015-12-10 DIAGNOSIS — E1165 Type 2 diabetes mellitus with hyperglycemia: Secondary | ICD-10-CM | POA: Diagnosis not present

## 2015-12-10 DIAGNOSIS — E669 Obesity, unspecified: Secondary | ICD-10-CM | POA: Diagnosis not present

## 2015-12-14 DIAGNOSIS — I130 Hypertensive heart and chronic kidney disease with heart failure and stage 1 through stage 4 chronic kidney disease, or unspecified chronic kidney disease: Secondary | ICD-10-CM | POA: Diagnosis not present

## 2015-12-14 DIAGNOSIS — I252 Old myocardial infarction: Secondary | ICD-10-CM | POA: Diagnosis not present

## 2015-12-14 DIAGNOSIS — I25119 Atherosclerotic heart disease of native coronary artery with unspecified angina pectoris: Secondary | ICD-10-CM | POA: Diagnosis not present

## 2015-12-14 DIAGNOSIS — I509 Heart failure, unspecified: Secondary | ICD-10-CM | POA: Diagnosis not present

## 2015-12-14 DIAGNOSIS — Z87891 Personal history of nicotine dependence: Secondary | ICD-10-CM | POA: Diagnosis not present

## 2015-12-14 DIAGNOSIS — Z951 Presence of aortocoronary bypass graft: Secondary | ICD-10-CM | POA: Diagnosis not present

## 2015-12-14 DIAGNOSIS — E1122 Type 2 diabetes mellitus with diabetic chronic kidney disease: Secondary | ICD-10-CM | POA: Diagnosis not present

## 2015-12-14 DIAGNOSIS — Z950 Presence of cardiac pacemaker: Secondary | ICD-10-CM | POA: Diagnosis not present

## 2015-12-14 DIAGNOSIS — N184 Chronic kidney disease, stage 4 (severe): Secondary | ICD-10-CM | POA: Diagnosis not present

## 2015-12-14 DIAGNOSIS — I714 Abdominal aortic aneurysm, without rupture: Secondary | ICD-10-CM | POA: Diagnosis not present

## 2015-12-14 DIAGNOSIS — E1165 Type 2 diabetes mellitus with hyperglycemia: Secondary | ICD-10-CM | POA: Diagnosis not present

## 2015-12-18 DIAGNOSIS — E1165 Type 2 diabetes mellitus with hyperglycemia: Secondary | ICD-10-CM | POA: Diagnosis not present

## 2015-12-18 DIAGNOSIS — I509 Heart failure, unspecified: Secondary | ICD-10-CM | POA: Diagnosis not present

## 2015-12-18 DIAGNOSIS — I714 Abdominal aortic aneurysm, without rupture: Secondary | ICD-10-CM | POA: Diagnosis not present

## 2015-12-18 DIAGNOSIS — N184 Chronic kidney disease, stage 4 (severe): Secondary | ICD-10-CM | POA: Diagnosis not present

## 2015-12-18 DIAGNOSIS — I25119 Atherosclerotic heart disease of native coronary artery with unspecified angina pectoris: Secondary | ICD-10-CM | POA: Diagnosis not present

## 2015-12-18 DIAGNOSIS — I130 Hypertensive heart and chronic kidney disease with heart failure and stage 1 through stage 4 chronic kidney disease, or unspecified chronic kidney disease: Secondary | ICD-10-CM | POA: Diagnosis not present

## 2015-12-22 DIAGNOSIS — E1165 Type 2 diabetes mellitus with hyperglycemia: Secondary | ICD-10-CM | POA: Diagnosis not present

## 2015-12-22 DIAGNOSIS — I509 Heart failure, unspecified: Secondary | ICD-10-CM | POA: Diagnosis not present

## 2015-12-22 DIAGNOSIS — N184 Chronic kidney disease, stage 4 (severe): Secondary | ICD-10-CM | POA: Diagnosis not present

## 2015-12-22 DIAGNOSIS — I130 Hypertensive heart and chronic kidney disease with heart failure and stage 1 through stage 4 chronic kidney disease, or unspecified chronic kidney disease: Secondary | ICD-10-CM | POA: Diagnosis not present

## 2015-12-22 DIAGNOSIS — I714 Abdominal aortic aneurysm, without rupture: Secondary | ICD-10-CM | POA: Diagnosis not present

## 2015-12-22 DIAGNOSIS — I25119 Atherosclerotic heart disease of native coronary artery with unspecified angina pectoris: Secondary | ICD-10-CM | POA: Diagnosis not present

## 2015-12-27 ENCOUNTER — Other Ambulatory Visit: Payer: Self-pay | Admitting: *Deleted

## 2015-12-27 MED ORDER — POTASSIUM CHLORIDE ER 10 MEQ PO TBCR: 20.0000 meq | EXTENDED_RELEASE_TABLET | Freq: Every day | ORAL | 6 refills | Status: DC

## 2015-12-27 MED ORDER — FUROSEMIDE 80 MG PO TABS: 80.0000 mg | ORAL_TABLET | Freq: Two times a day (BID) | ORAL | 6 refills | Status: DC

## 2015-12-27 MED ORDER — CARVEDILOL 25 MG PO TABS: 25.0000 mg | ORAL_TABLET | Freq: Two times a day (BID) | ORAL | 6 refills | Status: DC

## 2015-12-27 MED ORDER — SIMVASTATIN 20 MG PO TABS: 20.0000 mg | ORAL_TABLET | Freq: Every day | ORAL | 6 refills | Status: DC

## 2015-12-27 MED ORDER — AMLODIPINE BESYLATE 2.5 MG PO TABS: 2.5000 mg | ORAL_TABLET | Freq: Every day | ORAL | 6 refills | Status: DC

## 2015-12-28 ENCOUNTER — Telehealth: Payer: Self-pay

## 2015-12-28 DIAGNOSIS — I714 Abdominal aortic aneurysm, without rupture: Secondary | ICD-10-CM | POA: Diagnosis not present

## 2015-12-28 DIAGNOSIS — M109 Gout, unspecified: Secondary | ICD-10-CM | POA: Diagnosis not present

## 2015-12-28 DIAGNOSIS — N189 Chronic kidney disease, unspecified: Secondary | ICD-10-CM | POA: Diagnosis not present

## 2015-12-28 DIAGNOSIS — E1129 Type 2 diabetes mellitus with other diabetic kidney complication: Secondary | ICD-10-CM | POA: Diagnosis not present

## 2015-12-28 DIAGNOSIS — N183 Chronic kidney disease, stage 3 (moderate): Secondary | ICD-10-CM | POA: Diagnosis not present

## 2015-12-28 DIAGNOSIS — I10 Essential (primary) hypertension: Secondary | ICD-10-CM | POA: Diagnosis not present

## 2015-12-28 NOTE — Telephone Encounter (Signed)
LM to get bmet prior to visit with Dr Domenic Polite next week

## 2015-12-28 NOTE — Telephone Encounter (Signed)
Duplicate phone note.

## 2015-12-31 DIAGNOSIS — I130 Hypertensive heart and chronic kidney disease with heart failure and stage 1 through stage 4 chronic kidney disease, or unspecified chronic kidney disease: Secondary | ICD-10-CM | POA: Diagnosis not present

## 2015-12-31 DIAGNOSIS — I509 Heart failure, unspecified: Secondary | ICD-10-CM | POA: Diagnosis not present

## 2015-12-31 DIAGNOSIS — I25119 Atherosclerotic heart disease of native coronary artery with unspecified angina pectoris: Secondary | ICD-10-CM | POA: Diagnosis not present

## 2015-12-31 DIAGNOSIS — I714 Abdominal aortic aneurysm, without rupture: Secondary | ICD-10-CM | POA: Diagnosis not present

## 2015-12-31 DIAGNOSIS — E1165 Type 2 diabetes mellitus with hyperglycemia: Secondary | ICD-10-CM | POA: Diagnosis not present

## 2015-12-31 DIAGNOSIS — N184 Chronic kidney disease, stage 4 (severe): Secondary | ICD-10-CM | POA: Diagnosis not present

## 2016-01-03 ENCOUNTER — Telehealth: Payer: Self-pay

## 2016-01-03 NOTE — Telephone Encounter (Signed)
Spoke with daughter,pt has still not had Bmet for tomorrow apt,she will try to reach pt and have done today

## 2016-01-04 ENCOUNTER — Ambulatory Visit: Payer: Medicare Other | Admitting: Cardiology

## 2016-01-05 DIAGNOSIS — E1165 Type 2 diabetes mellitus with hyperglycemia: Secondary | ICD-10-CM | POA: Diagnosis not present

## 2016-01-05 DIAGNOSIS — I714 Abdominal aortic aneurysm, without rupture: Secondary | ICD-10-CM | POA: Diagnosis not present

## 2016-01-05 DIAGNOSIS — N184 Chronic kidney disease, stage 4 (severe): Secondary | ICD-10-CM | POA: Diagnosis not present

## 2016-01-05 DIAGNOSIS — I130 Hypertensive heart and chronic kidney disease with heart failure and stage 1 through stage 4 chronic kidney disease, or unspecified chronic kidney disease: Secondary | ICD-10-CM | POA: Diagnosis not present

## 2016-01-05 DIAGNOSIS — I509 Heart failure, unspecified: Secondary | ICD-10-CM | POA: Diagnosis not present

## 2016-01-05 DIAGNOSIS — I25119 Atherosclerotic heart disease of native coronary artery with unspecified angina pectoris: Secondary | ICD-10-CM | POA: Diagnosis not present

## 2016-01-06 DIAGNOSIS — E1165 Type 2 diabetes mellitus with hyperglycemia: Secondary | ICD-10-CM | POA: Diagnosis not present

## 2016-01-07 ENCOUNTER — Other Ambulatory Visit (HOSPITAL_COMMUNITY): Payer: Self-pay | Admitting: Medical

## 2016-01-07 DIAGNOSIS — N183 Chronic kidney disease, stage 3 unspecified: Secondary | ICD-10-CM

## 2016-01-13 DIAGNOSIS — N184 Chronic kidney disease, stage 4 (severe): Secondary | ICD-10-CM | POA: Diagnosis not present

## 2016-01-13 DIAGNOSIS — I714 Abdominal aortic aneurysm, without rupture: Secondary | ICD-10-CM | POA: Diagnosis not present

## 2016-01-13 DIAGNOSIS — I509 Heart failure, unspecified: Secondary | ICD-10-CM | POA: Diagnosis not present

## 2016-01-13 DIAGNOSIS — E1165 Type 2 diabetes mellitus with hyperglycemia: Secondary | ICD-10-CM | POA: Diagnosis not present

## 2016-01-13 DIAGNOSIS — I130 Hypertensive heart and chronic kidney disease with heart failure and stage 1 through stage 4 chronic kidney disease, or unspecified chronic kidney disease: Secondary | ICD-10-CM | POA: Diagnosis not present

## 2016-01-13 DIAGNOSIS — I25119 Atherosclerotic heart disease of native coronary artery with unspecified angina pectoris: Secondary | ICD-10-CM | POA: Diagnosis not present

## 2016-01-20 ENCOUNTER — Encounter: Payer: Self-pay | Admitting: Cardiology

## 2016-01-20 ENCOUNTER — Ambulatory Visit (INDEPENDENT_AMBULATORY_CARE_PROVIDER_SITE_OTHER): Payer: Medicare Other | Admitting: Cardiology

## 2016-01-20 VITALS — BP 124/58 | HR 61 | Ht 73.0 in | Wt 256.3 lb

## 2016-01-20 DIAGNOSIS — N183 Chronic kidney disease, stage 3 (moderate): Secondary | ICD-10-CM

## 2016-01-20 DIAGNOSIS — I714 Abdominal aortic aneurysm, without rupture: Secondary | ICD-10-CM | POA: Diagnosis not present

## 2016-01-20 DIAGNOSIS — I25119 Atherosclerotic heart disease of native coronary artery with unspecified angina pectoris: Secondary | ICD-10-CM

## 2016-01-20 DIAGNOSIS — N184 Chronic kidney disease, stage 4 (severe): Secondary | ICD-10-CM | POA: Diagnosis not present

## 2016-01-20 DIAGNOSIS — E1165 Type 2 diabetes mellitus with hyperglycemia: Secondary | ICD-10-CM | POA: Diagnosis not present

## 2016-01-20 DIAGNOSIS — I509 Heart failure, unspecified: Secondary | ICD-10-CM | POA: Diagnosis not present

## 2016-01-20 DIAGNOSIS — I130 Hypertensive heart and chronic kidney disease with heart failure and stage 1 through stage 4 chronic kidney disease, or unspecified chronic kidney disease: Secondary | ICD-10-CM | POA: Diagnosis not present

## 2016-01-20 DIAGNOSIS — I255 Ischemic cardiomyopathy: Secondary | ICD-10-CM

## 2016-01-20 DIAGNOSIS — I493 Ventricular premature depolarization: Secondary | ICD-10-CM | POA: Diagnosis not present

## 2016-01-20 NOTE — Patient Instructions (Signed)
Your physician recommends that you schedule a follow-up appointment in: 3 MONTHS WITH DR. MCDOWELL  Your physician recommends that you continue on your current medications as directed. Please refer to the Current Medication list given to you today.  Thank you for choosing [facility]!!    

## 2016-01-20 NOTE — Progress Notes (Signed)
Cardiology Office Note  Date: 01/20/2016   ID: [name], DOB Nov 16, 1945, MRN [id number]  PCP: Rosita Fire, MD  Primary Cardiologist: Rozann Lesches, MD   Chief Complaint  Patient presents with  . Coronary Artery Disease    History of Present Illness: [name] is a medically complex 70 y.o. male last seen in May. He presents for a routine follow-up visit. Reports chronic fatigue, no change in dyspnea on exertion or angina however. He has established with Dr. Lowanda Foster with worsening renal function, due to have follow-up lab work next week.  I reviewed his medications which are outlined below. Cardiac regimen includes aspirin, Coreg, Norvasc, Lasix, Imdur, potassium supplements, and Zocor. I reviewed his ECG today which shows ventricular pacing with atrial sensing. He has not had any palpitations or device discharges. He continues to follow in the device clinic with remote interrogation of his ICD.  Past Medical History:  Diagnosis Date  . AAA (abdominal aortic aneurysm) ([place])    Followed by Dr. Donnetta Hutching  . AICD (automatic cardioverter/defibrillator) present   . Angioedema    Secondary to ACE inhibitor  . Arthritis   . Atrial fibrillation ([place])   . Bell palsy   . Carotid artery disease ([place])   . Coronary atherosclerosis of native coronary artery    Multivessel status post CABG  . Essential hypertension, benign   . Gout   . Hernia of abdominal wall   . Hypercholesteremia   . Ischemic cardiomyopathy    LVEF 45-50% 11/2011  . Migraines   . Myocardial infarction ([place])   . Nephrolithiasis   . PAD (peripheral artery disease) ([place])    Followed by Dr. Donnetta Hutching  . Sleep apnea   . Type 2 diabetes mellitus ([place])     Past Surgical History:  Procedure Laterality Date  . ABDOMINAL AORTIC ANEURYSM REPAIR  November 19, 2013   [facility] :  Dr. Sammuel Hines  . ABDOMINAL AORTIC ANEURYSM REPAIR  09-07-2014   [facility]  . BI-VENTRICULAR IMPLANTABLE CARDIOVERTER  DEFIBRILLATOR  (CRT-D)  11/05/2014  . BYPASS GRAFT POPLITEAL TO POPLITEAL Left 06/08/2014   Procedure: BYPASS GRAFT FEMORAL ARTERY TO ABOVE KNEE POPLITEAL;  Surgeon: Rosetta Posner, MD;  Location: [place];  Service: Vascular;  Laterality: Left;  . CARDIAC CATHETERIZATION N/A 10/02/2014   Procedure: Left Heart Cath and Cors/Grafts Angiography;  Surgeon: Belva Crome, MD;  Location: [place] [facility];  Service: Cardiovascular;  Laterality: N/A;  . CARDIAC CATHETERIZATION  "several"  . CATARACT EXTRACTION W/PHACO  03/23/2011   Procedure: CATARACT EXTRACTION PHACO AND INTRAOCULAR LENS PLACEMENT (IOC);  Surgeon: Tonny Branch;  Location: [facility];  Service: Ophthalmology;  Laterality: Left;  CDE: 15.99  . CATARACT EXTRACTION W/PHACO  04/17/2011   Procedure: CATARACT EXTRACTION PHACO AND INTRAOCULAR LENS PLACEMENT (IOC);  Surgeon: Tonny Branch;  Location: [facility];  Service: Ophthalmology;  Laterality: Right;  CDE: 23.45  . CORONARY ARTERY BYPASS GRAFT  2001   LIMA to LAD, SVG to diagonal and ramus, SVG to OM, SVG to AM  . DUPUYTREN CONTRACTURE RELEASE Left 2009  . EP IMPLANTABLE DEVICE N/A 11/05/2014   Procedure: BiV ICD Insertion CRT-D;  Surgeon: Evans Lance, MD;  Location: [place] [facility];  Service: Cardiovascular;  Laterality: N/A;  . EP IMPLANTABLE DEVICE N/A 02/24/2015   Procedure: Lead Revision/Repair;  Surgeon: Evans Lance, MD;  Location: [place] [facility];  Service: Cardiovascular;  Laterality: N/A;  . EXTRACORPOREAL SHOCK WAVE LITHOTRIPSY  X 1  . EYE SURGERY    . FOOT SURGERY Left 1963   "gangrene"  . ICD LEAD REMOVAL  02/24/2015  . LOWER EXTREMITY ANGIOGRAM Bilateral 04/28/2015   Procedure: Lower Extremity Angiogram;  Surgeon: Rosetta Posner, MD;  Location: [place] [facility];  Service: Cardiovascular;  Laterality: Bilateral;  . PERIPHERAL VASCULAR CATHETERIZATION N/A 04/28/2015   Procedure: Abdominal Aortogram;  Surgeon: Rosetta Posner, MD;  Location: [place] [facility];  Service: Cardiovascular;   Laterality: N/A;    Current Outpatient Prescriptions  Medication Sig Dispense Refill  . acetaminophen (TYLENOL) 500 MG tablet Take 1,000 mg by mouth once as needed for mild pain, moderate pain or headache (pain).     Marland Kitchen allopurinol (ZYLOPRIM) 100 MG tablet Take 100 mg by mouth daily with supper.     Marland Kitchen amLODipine (NORVASC) 2.5 MG tablet Take 1 tablet (2.5 mg total) by mouth daily. 30 tablet 6  . aspirin EC 81 MG tablet Take 81 mg by mouth daily.    . carvedilol (COREG) 25 MG tablet Take 1 tablet (25 mg total) by mouth 2 (two) times daily. 60 tablet 6  . furosemide (LASIX) 80 MG tablet Take 1 tablet (80 mg total) by mouth 2 (two) times daily. 60 tablet 6  . gabapentin (NEURONTIN) 300 MG capsule Take 300 mg by mouth 2 (two) times daily.    Marland Kitchen glipiZIDE (GLUCOTROL XL) 10 MG 24 hr tablet Take 10 mg by mouth 2 (two) times daily.     . isosorbide mononitrate (IMDUR) 60 MG 24 hr tablet Take 30-120 mg by mouth 2 (two) times daily. 120 mg am, 30 mg pm    . linagliptin (TRADJENTA) 5 MG TABS tablet Take 5 mg by mouth daily.      . nitroGLYCERIN (NITROSTAT) 0.4 MG SL tablet PLACE 0.4 MG  UNDER THE TONGUE EVERY 5 MINUTES UP TO 3 DOSES AS NEEDED FOR CHEST PAIN. 25 tablet 3  . potassium chloride (K-DUR) 10 MEQ tablet Take 2 tablets (20 mEq total) by mouth daily. 60 tablet 6  . simvastatin (ZOCOR) 20 MG tablet Take 1 tablet (20 mg total) by mouth at bedtime. 30 tablet 6   No current facility-administered medications for this visit.    Allergies:  Ace inhibitors; Codeine; Lisinopril; and Hydromorphone   Social History: The patient  reports that he quit smoking about 8 years ago. His smoking use included Cigarettes. He started smoking about 36 years ago. He has a 25.00 pack-year smoking history. He has never used smokeless tobacco. He reports that he does not drink alcohol or use drugs.   ROS:  Please see the history of present illness. Otherwise, complete review of systems is positive for fatigue.  All other  systems are reviewed and negative.   Physical Exam: VS:  BP (!) 124/58   Pulse 61   Ht 6\' 1"  (1.854 m)   Wt 256 lb 4.8 oz (116.3 kg)   SpO2 94%   BMI 33.81 kg/m , BMI Body mass index is 33.81 kg/m.  Wt Readings from Last 3 Encounters:  01/20/16 256 lb 4.8 oz (116.3 kg)  12/06/15 252 lb (114.3 kg)  11/01/15 245 lb (111.1 kg)    General: Obese male, appears comfortable at rest. HEENT: Conjunctiva and lids normal, oropharynx clear. Neck: Supple, elevated JVP, no carotid bruits, no thyromegaly. Thorax: Well-healed device pocket site on left. Lungs: Decreased breath sounds, nonlabored breathing at rest. Cardiac: Indistinct PMI, regular rate and rhythm, no S3, no pericardial  rub. Abdomen: Protuberant, nontender, bowel sounds present. Extremities: 1+ edema, distal pulses diminished.  ECG: I personally reviewed the tracing from 02/25/2015 showing a ventricular paced rhythm with atrial sensing.  Recent Labwork: 02/19/2015: Platelets 327 03/08/2015: Magnesium 2.2; TSH 3.364 04/28/2015: Hemoglobin 13.9 09/23/2015: BUN 29; Creat 1.92; Potassium 3.8; Sodium 141     Component Value Date/Time   CHOL 118 10/16/2014 0715   TRIG 100 10/16/2014 0715   HDL 33 (L) 10/16/2014 0715   CHOLHDL 3.6 10/16/2014 0715   VLDL 20 10/16/2014 0715   LDLCALC 65 10/16/2014 0715    Other Studies Reviewed Today:  Cardiac catheterization 10/02/2014:  Bypass graft failure with total occlusion of the saphenous vein graft to the acute marginal of the right coronary, total occlusion of the sequential saphenous vein graft to the diagonal and ramus intermedius, and total occlusion of the saphenous graft to the distal circumflex/obtuse marginal.  Patent LIMA to the LAD.  Total occlusion of the native LAD, the mid right coronary, second obtuse marginal, the first diagonal, and the ramus intermedius.  The distal right coronary, the obtuse marginal, ramus intermedius are collateralized from the native circulation via  the LIMA to LAD.  Severe left ventricular systolic dysfunction with an inferobasal aneurysm, and an ejection fraction of 20-25%.  Echocardiogram 06/28/2015: Study Conclusions  - Procedure narrative: Transthoracic echocardiography. Image quality was fair. The study was technically difficult, as a result of poor sound wave transmission. Intravenous contrast (Definity) was administered. - Left ventricle: The cavity size was normal. Systolic function was moderately reduced. The estimated ejection fraction was in the range of 35% to 40%. Diffusely hypokinetic. Doppler parameters are consistent with abnormal left ventricular relaxation (grade 1 diastolic dysfunction). Doppler parameters are consistent with high ventricular filling pressure. Mild to moderate concentric left ventricular hypertrophy. - Regional wall motion abnormality: Severe hypokinesis of the mid inferior myocardium; mild hypokinesis of the mid inferoseptal myocardium. - Ventricular septum: Septal motion showed abnormal function and dyssynergy. These changes are consistent with right ventricular pacing. - Aortic valve: Poorly visualized. Mildly thickened, mildly calcified leaflets. There may be mild aortic valve stenosis. - Mitral valve: Calcified annulus. - Left atrium: The atrium was mildly dilated. - Right ventricle: Pacer wire or catheter noted in right ventricle.  Assessment and Plan:  1. Multivessel CAD status post CABG with graft disease excluding patent LIMA to LAD. Would continue medical therapy and he has been stable without active angina symptoms.  2. Ischemic cardiomyopathy with LVEF 35-40%. Progressive renal disease limits use of ACE inhibitor, ARB, or Entresto.  3. BiV ICD in place, follow-up as per Dr. Lovena Le.  4. CKD, stage 3-4. Now following with Dr. Lowanda Foster.  Current medicines were reviewed with the patient today.   Orders Placed This Encounter  Procedures  . EKG  12-Lead    Disposition: Follow-up with me in 3 months.  Signed, Satira Sark, MD, [facility] 01/20/2016 4:31 PM    [facility] [address] [place], [place], [place] [address] Phone: [phone]; Fax: [phone]

## 2016-01-26 ENCOUNTER — Ambulatory Visit (HOSPITAL_COMMUNITY)
Admission: RE | Admit: 2016-01-26 | Discharge: 2016-01-26 | Disposition: A | Discharge status: Home / Self Care | Payer: Medicare Other | Source: Ambulatory Visit | Attending: Medical | Admitting: Medical

## 2016-01-26 DIAGNOSIS — N183 Chronic kidney disease, stage 3 unspecified: Secondary | ICD-10-CM

## 2016-01-26 DIAGNOSIS — E559 Vitamin D deficiency, unspecified: Secondary | ICD-10-CM | POA: Diagnosis not present

## 2016-01-26 DIAGNOSIS — N184 Chronic kidney disease, stage 4 (severe): Secondary | ICD-10-CM | POA: Diagnosis not present

## 2016-01-26 DIAGNOSIS — D509 Iron deficiency anemia, unspecified: Secondary | ICD-10-CM | POA: Diagnosis not present

## 2016-01-26 DIAGNOSIS — I509 Heart failure, unspecified: Secondary | ICD-10-CM | POA: Diagnosis not present

## 2016-01-26 DIAGNOSIS — Z79899 Other long term (current) drug therapy: Secondary | ICD-10-CM | POA: Diagnosis not present

## 2016-01-26 DIAGNOSIS — N281 Cyst of kidney, acquired: Secondary | ICD-10-CM | POA: Diagnosis not present

## 2016-01-26 DIAGNOSIS — E1165 Type 2 diabetes mellitus with hyperglycemia: Secondary | ICD-10-CM | POA: Diagnosis not present

## 2016-01-26 DIAGNOSIS — R809 Proteinuria, unspecified: Secondary | ICD-10-CM | POA: Diagnosis not present

## 2016-01-26 DIAGNOSIS — I714 Abdominal aortic aneurysm, without rupture: Secondary | ICD-10-CM | POA: Diagnosis not present

## 2016-01-26 DIAGNOSIS — I25119 Atherosclerotic heart disease of native coronary artery with unspecified angina pectoris: Secondary | ICD-10-CM | POA: Diagnosis not present

## 2016-01-26 DIAGNOSIS — I130 Hypertensive heart and chronic kidney disease with heart failure and stage 1 through stage 4 chronic kidney disease, or unspecified chronic kidney disease: Secondary | ICD-10-CM | POA: Diagnosis not present

## 2016-01-26 DIAGNOSIS — I10 Essential (primary) hypertension: Secondary | ICD-10-CM | POA: Diagnosis not present

## 2016-02-04 DIAGNOSIS — E1165 Type 2 diabetes mellitus with hyperglycemia: Secondary | ICD-10-CM | POA: Diagnosis not present

## 2016-02-09 DIAGNOSIS — E785 Hyperlipidemia, unspecified: Secondary | ICD-10-CM | POA: Diagnosis not present

## 2016-02-09 DIAGNOSIS — I2510 Atherosclerotic heart disease of native coronary artery without angina pectoris: Secondary | ICD-10-CM | POA: Diagnosis not present

## 2016-02-09 DIAGNOSIS — I10 Essential (primary) hypertension: Secondary | ICD-10-CM | POA: Diagnosis not present

## 2016-02-09 DIAGNOSIS — I501 Left ventricular failure: Secondary | ICD-10-CM | POA: Diagnosis not present

## 2016-02-09 DIAGNOSIS — E119 Type 2 diabetes mellitus without complications: Secondary | ICD-10-CM | POA: Diagnosis not present

## 2016-02-09 DIAGNOSIS — E559 Vitamin D deficiency, unspecified: Secondary | ICD-10-CM | POA: Diagnosis not present

## 2016-02-09 DIAGNOSIS — Z125 Encounter for screening for malignant neoplasm of prostate: Secondary | ICD-10-CM | POA: Diagnosis not present

## 2016-02-15 DIAGNOSIS — N183 Chronic kidney disease, stage 3 (moderate): Secondary | ICD-10-CM | POA: Diagnosis not present

## 2016-02-15 DIAGNOSIS — N189 Chronic kidney disease, unspecified: Secondary | ICD-10-CM | POA: Diagnosis not present

## 2016-02-15 DIAGNOSIS — I714 Abdominal aortic aneurysm, without rupture: Secondary | ICD-10-CM | POA: Diagnosis not present

## 2016-02-15 DIAGNOSIS — I10 Essential (primary) hypertension: Secondary | ICD-10-CM | POA: Diagnosis not present

## 2016-02-15 DIAGNOSIS — E1129 Type 2 diabetes mellitus with other diabetic kidney complication: Secondary | ICD-10-CM | POA: Diagnosis not present

## 2016-02-15 DIAGNOSIS — M109 Gout, unspecified: Secondary | ICD-10-CM | POA: Diagnosis not present

## 2016-02-17 DIAGNOSIS — T7840XA Allergy, unspecified, initial encounter: Secondary | ICD-10-CM | POA: Diagnosis not present

## 2016-02-17 DIAGNOSIS — Z713 Dietary counseling and surveillance: Secondary | ICD-10-CM | POA: Diagnosis not present

## 2016-02-17 DIAGNOSIS — I10 Essential (primary) hypertension: Secondary | ICD-10-CM | POA: Diagnosis not present

## 2016-02-17 DIAGNOSIS — E559 Vitamin D deficiency, unspecified: Secondary | ICD-10-CM | POA: Diagnosis not present

## 2016-02-17 DIAGNOSIS — E669 Obesity, unspecified: Secondary | ICD-10-CM | POA: Diagnosis not present

## 2016-02-23 DIAGNOSIS — Z6835 Body mass index (BMI) 35.0-35.9, adult: Secondary | ICD-10-CM | POA: Diagnosis not present

## 2016-02-23 DIAGNOSIS — E119 Type 2 diabetes mellitus without complications: Secondary | ICD-10-CM | POA: Diagnosis not present

## 2016-02-23 DIAGNOSIS — E669 Obesity, unspecified: Secondary | ICD-10-CM | POA: Diagnosis not present

## 2016-02-24 ENCOUNTER — Encounter: Payer: Self-pay | Admitting: Internal Medicine

## 2016-02-24 ENCOUNTER — Ambulatory Visit (INDEPENDENT_AMBULATORY_CARE_PROVIDER_SITE_OTHER): Payer: Medicare Other | Admitting: Internal Medicine

## 2016-02-24 VITALS — BP 126/78 | HR 67 | Ht 72.0 in | Wt 249.0 lb

## 2016-02-24 DIAGNOSIS — Z95810 Presence of automatic (implantable) cardiac defibrillator: Secondary | ICD-10-CM

## 2016-02-24 DIAGNOSIS — I5022 Chronic systolic (congestive) heart failure: Secondary | ICD-10-CM | POA: Diagnosis not present

## 2016-02-24 DIAGNOSIS — I255 Ischemic cardiomyopathy: Secondary | ICD-10-CM | POA: Diagnosis not present

## 2016-02-24 NOTE — Patient Instructions (Signed)
Your physician wants you to follow-up in: 1 Year with Dr. Lovena Le.  You will receive a reminder letter in the mail two months in advance. If you don't receive a letter, please call our office to schedule the follow-up appointment.  Your physician recommends that you continue on your current medications as directed. Please refer to the Current Medication list given to you today.  Remote monitoring is used to monitor your Pacemaker of ICD from home. This monitoring reduces the number of office visits required to check your device to one time per year. It allows [place] to keep an eye on the functioning of your device to ensure it is working properly. You are scheduled for a device check from home on 05/25/16. You may send your transmission at any time that day. If you have a wireless device, the transmission will be sent automatically. After your physician reviews your transmission, you will receive a postcard with your next transmission date.  If you need a refill on your cardiac medications before your next appointment, please call your pharmacy.  Thank you for choosing [facility]!

## 2016-02-24 NOTE — Progress Notes (Signed)
HPI [name] returns today for ongoing ICD followup. He is a pleasant 70 yo man with chronic systolic heart failure, CAD, s/p CABG, LBBB, who underwent BiV ICD implant approx. several months ago. He developed diaphragmatic stimulation and retraction of his LV lead and underwent revision. He has done well in the interim. He denies chest pain or sob.  He is trying to lose weight. He still has claudication. Allergies  Allergen Reactions  . Ace Inhibitors Anaphylaxis  . Codeine Other (See Comments)     delirium  . Lisinopril Anaphylaxis and Swelling  . Hydromorphone Nausea And Vomiting     Current Outpatient Prescriptions  Medication Sig Dispense Refill  . acetaminophen (TYLENOL) 500 MG tablet Take 1,000 mg by mouth once as needed for mild pain, moderate pain or headache (pain).     Marland Kitchen allopurinol (ZYLOPRIM) 100 MG tablet Take 100 mg by mouth daily with supper.     Marland Kitchen amLODipine (NORVASC) 2.5 MG tablet Take 1 tablet (2.5 mg total) by mouth daily. 30 tablet 6  . aspirin EC 81 MG tablet Take 81 mg by mouth daily.    . carvedilol (COREG) 25 MG tablet Take 1 tablet (25 mg total) by mouth 2 (two) times daily. 60 tablet 6  . furosemide (LASIX) 80 MG tablet Take 1 tablet (80 mg total) by mouth 2 (two) times daily. 60 tablet 6  . gabapentin (NEURONTIN) 300 MG capsule Take 300 mg by mouth 2 (two) times daily.    Marland Kitchen glipiZIDE (GLUCOTROL XL) 10 MG 24 hr tablet Take 10 mg by mouth 2 (two) times daily.     . isosorbide mononitrate (IMDUR) 60 MG 24 hr tablet Take 30-120 mg by mouth 2 (two) times daily. 120 mg am, 30 mg pm    . linagliptin (TRADJENTA) 5 MG TABS tablet Take 5 mg by mouth daily.      . nitroGLYCERIN (NITROSTAT) 0.4 MG SL tablet PLACE 0.4 MG  UNDER THE TONGUE EVERY 5 MINUTES UP TO 3 DOSES AS NEEDED FOR CHEST PAIN. 25 tablet 3  . potassium chloride (K-DUR) 10 MEQ tablet Take 2 tablets (20 mEq total) by mouth daily. 60 tablet 6  . simvastatin (ZOCOR) 20 MG tablet Take 1 tablet (20 mg  total) by mouth at bedtime. 30 tablet 6   No current facility-administered medications for this visit.      Past Medical History:  Diagnosis Date  . AAA (abdominal aortic aneurysm) ([place])    Followed by Dr. Donnetta Hutching  . AICD (automatic cardioverter/defibrillator) present   . Angioedema    Secondary to ACE inhibitor  . Arthritis   . Atrial fibrillation ([place])   . Bell palsy   . Carotid artery disease ([place])   . Coronary atherosclerosis of native coronary artery    Multivessel status post CABG  . Essential hypertension, benign   . Gout   . Hernia of abdominal wall   . Hypercholesteremia   . Ischemic cardiomyopathy    LVEF 45-50% 11/2011  . Migraines   . Myocardial infarction   . Nephrolithiasis   . PAD (peripheral artery disease) ([place])    Followed by Dr. Donnetta Hutching  . Sleep apnea   . Type 2 diabetes mellitus (HCC)     ROS:   All systems reviewed and negative except as noted in the HPI.   Past Surgical History:  Procedure Laterality Date  . ABDOMINAL AORTIC ANEURYSM REPAIR  November 19, 2013   [facility] :  Dr.  Sammuel Hines  . ABDOMINAL AORTIC ANEURYSM REPAIR  09-07-2014   [facility]  . BI-VENTRICULAR IMPLANTABLE CARDIOVERTER DEFIBRILLATOR  (CRT-D)  11/05/2014  . BYPASS GRAFT POPLITEAL TO POPLITEAL Left 06/08/2014   Procedure: BYPASS GRAFT FEMORAL ARTERY TO ABOVE KNEE POPLITEAL;  Surgeon: Rosetta Posner, MD;  Location: [place];  Service: Vascular;  Laterality: Left;  . CARDIAC CATHETERIZATION N/A 10/02/2014   Procedure: Left Heart Cath and Cors/Grafts Angiography;  Surgeon: Belva Crome, MD;  Location: [place] [facility];  Service: Cardiovascular;  Laterality: N/A;  . CARDIAC CATHETERIZATION  "several"  . CATARACT EXTRACTION W/PHACO  03/23/2011   Procedure: CATARACT EXTRACTION PHACO AND INTRAOCULAR LENS PLACEMENT (IOC);  Surgeon: Tonny Branch;  Location: [facility];  Service: Ophthalmology;  Laterality: Left;  CDE: 15.99  . CATARACT EXTRACTION W/PHACO  04/17/2011   Procedure: CATARACT  EXTRACTION PHACO AND INTRAOCULAR LENS PLACEMENT (IOC);  Surgeon: Tonny Branch;  Location: [facility];  Service: Ophthalmology;  Laterality: Right;  CDE: 23.45  . CORONARY ARTERY BYPASS GRAFT  2001   LIMA to LAD, SVG to diagonal and ramus, SVG to OM, SVG to AM  . DUPUYTREN CONTRACTURE RELEASE Left 2009  . EP IMPLANTABLE DEVICE N/A 11/05/2014   Procedure: BiV ICD Insertion CRT-D;  Surgeon: Evans Lance, MD;  Location: [place] [facility];  Service: Cardiovascular;  Laterality: N/A;  . EP IMPLANTABLE DEVICE N/A 02/24/2015   Procedure: Lead Revision/Repair;  Surgeon: Evans Lance, MD;  Location: [place] [facility];  Service: Cardiovascular;  Laterality: N/A;  . EXTRACORPOREAL SHOCK WAVE LITHOTRIPSY  X 1  . EYE SURGERY    . FOOT SURGERY Left 1963   "gangrene"  . ICD LEAD REMOVAL  02/24/2015  . LOWER EXTREMITY ANGIOGRAM Bilateral 04/28/2015   Procedure: Lower Extremity Angiogram;  Surgeon: Rosetta Posner, MD;  Location: [place] [facility];  Service: Cardiovascular;  Laterality: Bilateral;  . PERIPHERAL VASCULAR CATHETERIZATION N/A 04/28/2015   Procedure: Abdominal Aortogram;  Surgeon: Rosetta Posner, MD;  Location: [place] [facility];  Service: Cardiovascular;  Laterality: N/A;     Family History  Problem Relation Age of Onset  . Diabetes Mother     Type  I   . Varicose Veins Mother   . Heart disease Father 23    AAA  . AAA (abdominal aortic aneurysm) Father   . Hyperlipidemia Father   . Hypertension Father      Social History   Social History  . Marital status: Divorced    Spouse name: N/A  . Number of children: N/A  . Years of education: N/A   Occupational History  . Not on file.   Social History Main Topics  . Smoking status: Former Smoker    Packs/day: 1.00    Years: 25.00    Types: Cigarettes    Start date: 02/28/1979    Quit date: 05/23/2007  . Smokeless tobacco: Never Used  . Alcohol use No  . Drug use: No  . Sexual activity: No   Other Topics Concern  . Not on file    Social History Narrative  . No narrative on file     BP 126/78   Pulse 67   Ht 6' (1.829 m)   Wt 249 lb (112.9 kg)   SpO2 94%   BMI 33.77 kg/m   Physical Exam:  Well appearing 70 yo man, NAD HEENT: Unremarkable Neck:  7 cm JVD, no thyromegally Back:  No CVA tenderness Lungs:  Clear with no wheezes  HEART:  Regular rate rhythm, no murmurs, no rubs, no clicks Abd:  soft, positive bowel sounds, no organomegally, no rebound, no guarding Ext:   no edema, no cyanosis, no clubbing Skin:  No rashes no nodules Neuro:  CN II through XII intact, motor grossly intact    ICD interogation - normal BiV ICD function  Assess/Plan: 1. Chronic systolic heart failure - his symptoms are class 2. He is limited mostly by claudication. 2. Claudication - he is encouraged to walk more. He will followup with Dr. Donnetta Hutching in 2 months. 3. HTN - His blood pressure is well controlled. Will follow. 4. ICD - his St. Jude device is working normally. Will recheck in several months.   Mikle Bosworth.D.

## 2016-02-28 LAB — CUP PACEART INCLINIC DEVICE CHECK
Battery Remaining Longevity: 54
Brady Statistic RA Percent Paced: 15 %
Brady Statistic RV Percent Paced: 96 %
HIGH POWER IMPEDANCE MEASURED VALUE: 67.5 Ohm
Implantable Lead Location: 753859
Implantable Lead Model: 7122
Lead Channel Pacing Threshold Amplitude: 0.75 V
Lead Channel Sensing Intrinsic Amplitude: 11.6 mV
Lead Channel Setting Pacing Amplitude: 1.875
Lead Channel Setting Pacing Amplitude: 2 V
Lead Channel Setting Pacing Amplitude: 2.75 V
Lead Channel Setting Pacing Pulse Width: 0.5 ms
Lead Channel Setting Pacing Pulse Width: 1 ms
MDC IDC LEAD IMPLANT DT: 20160616
MDC IDC LEAD IMPLANT DT: 20161005
MDC IDC LEAD IMPLANT DT: 20161005
MDC IDC LEAD LOCATION: 753858
MDC IDC LEAD LOCATION: 753860
MDC IDC MSMT LEADCHNL LV IMPEDANCE VALUE: 762.5 Ohm
MDC IDC MSMT LEADCHNL LV PACING THRESHOLD AMPLITUDE: 1.75 V
MDC IDC MSMT LEADCHNL LV PACING THRESHOLD PULSEWIDTH: 1 ms
MDC IDC MSMT LEADCHNL RA IMPEDANCE VALUE: 537.5 Ohm
MDC IDC MSMT LEADCHNL RA PACING THRESHOLD AMPLITUDE: 0.75 V
MDC IDC MSMT LEADCHNL RA PACING THRESHOLD PULSEWIDTH: 0.5 ms
MDC IDC MSMT LEADCHNL RA SENSING INTR AMPL: 2.9 mV
MDC IDC MSMT LEADCHNL RV IMPEDANCE VALUE: 437.5 Ohm
MDC IDC MSMT LEADCHNL RV PACING THRESHOLD PULSEWIDTH: 0.5 ms
MDC IDC SESS DTM: 20171005131506
MDC IDC SET LEADCHNL RV SENSING SENSITIVITY: 0.5 mV
Pulse Gen Serial Number: 7288352

## 2016-03-01 DIAGNOSIS — E669 Obesity, unspecified: Secondary | ICD-10-CM | POA: Diagnosis not present

## 2016-03-01 DIAGNOSIS — Z6834 Body mass index (BMI) 34.0-34.9, adult: Secondary | ICD-10-CM | POA: Diagnosis not present

## 2016-03-08 DIAGNOSIS — E119 Type 2 diabetes mellitus without complications: Secondary | ICD-10-CM | POA: Diagnosis not present

## 2016-03-08 DIAGNOSIS — Z6834 Body mass index (BMI) 34.0-34.9, adult: Secondary | ICD-10-CM | POA: Diagnosis not present

## 2016-03-08 DIAGNOSIS — E669 Obesity, unspecified: Secondary | ICD-10-CM | POA: Diagnosis not present

## 2016-03-15 DIAGNOSIS — Z6834 Body mass index (BMI) 34.0-34.9, adult: Secondary | ICD-10-CM | POA: Diagnosis not present

## 2016-03-15 DIAGNOSIS — E669 Obesity, unspecified: Secondary | ICD-10-CM | POA: Diagnosis not present

## 2016-03-15 DIAGNOSIS — E119 Type 2 diabetes mellitus without complications: Secondary | ICD-10-CM | POA: Diagnosis not present

## 2016-03-30 DIAGNOSIS — Z6834 Body mass index (BMI) 34.0-34.9, adult: Secondary | ICD-10-CM | POA: Diagnosis not present

## 2016-03-30 DIAGNOSIS — I129 Hypertensive chronic kidney disease with stage 1 through stage 4 chronic kidney disease, or unspecified chronic kidney disease: Secondary | ICD-10-CM | POA: Diagnosis not present

## 2016-03-30 DIAGNOSIS — E559 Vitamin D deficiency, unspecified: Secondary | ICD-10-CM | POA: Diagnosis not present

## 2016-03-30 DIAGNOSIS — E669 Obesity, unspecified: Secondary | ICD-10-CM | POA: Diagnosis not present

## 2016-03-30 DIAGNOSIS — E119 Type 2 diabetes mellitus without complications: Secondary | ICD-10-CM | POA: Diagnosis not present

## 2016-04-04 ENCOUNTER — Ambulatory Visit: Payer: Medicare Other | Admitting: Vascular Surgery

## 2016-04-04 ENCOUNTER — Encounter (HOSPITAL_COMMUNITY): Payer: Medicare Other

## 2016-04-09 IMAGING — CR DG CHEST 2V
2 series · 2 of 2 positions shown · non-contrast
Comparison: 11/06/2014

CLINICAL DATA: Ischemic cardiomyopathy, CHF

EXAM:
CHEST  2 VIEW

[chest pa]
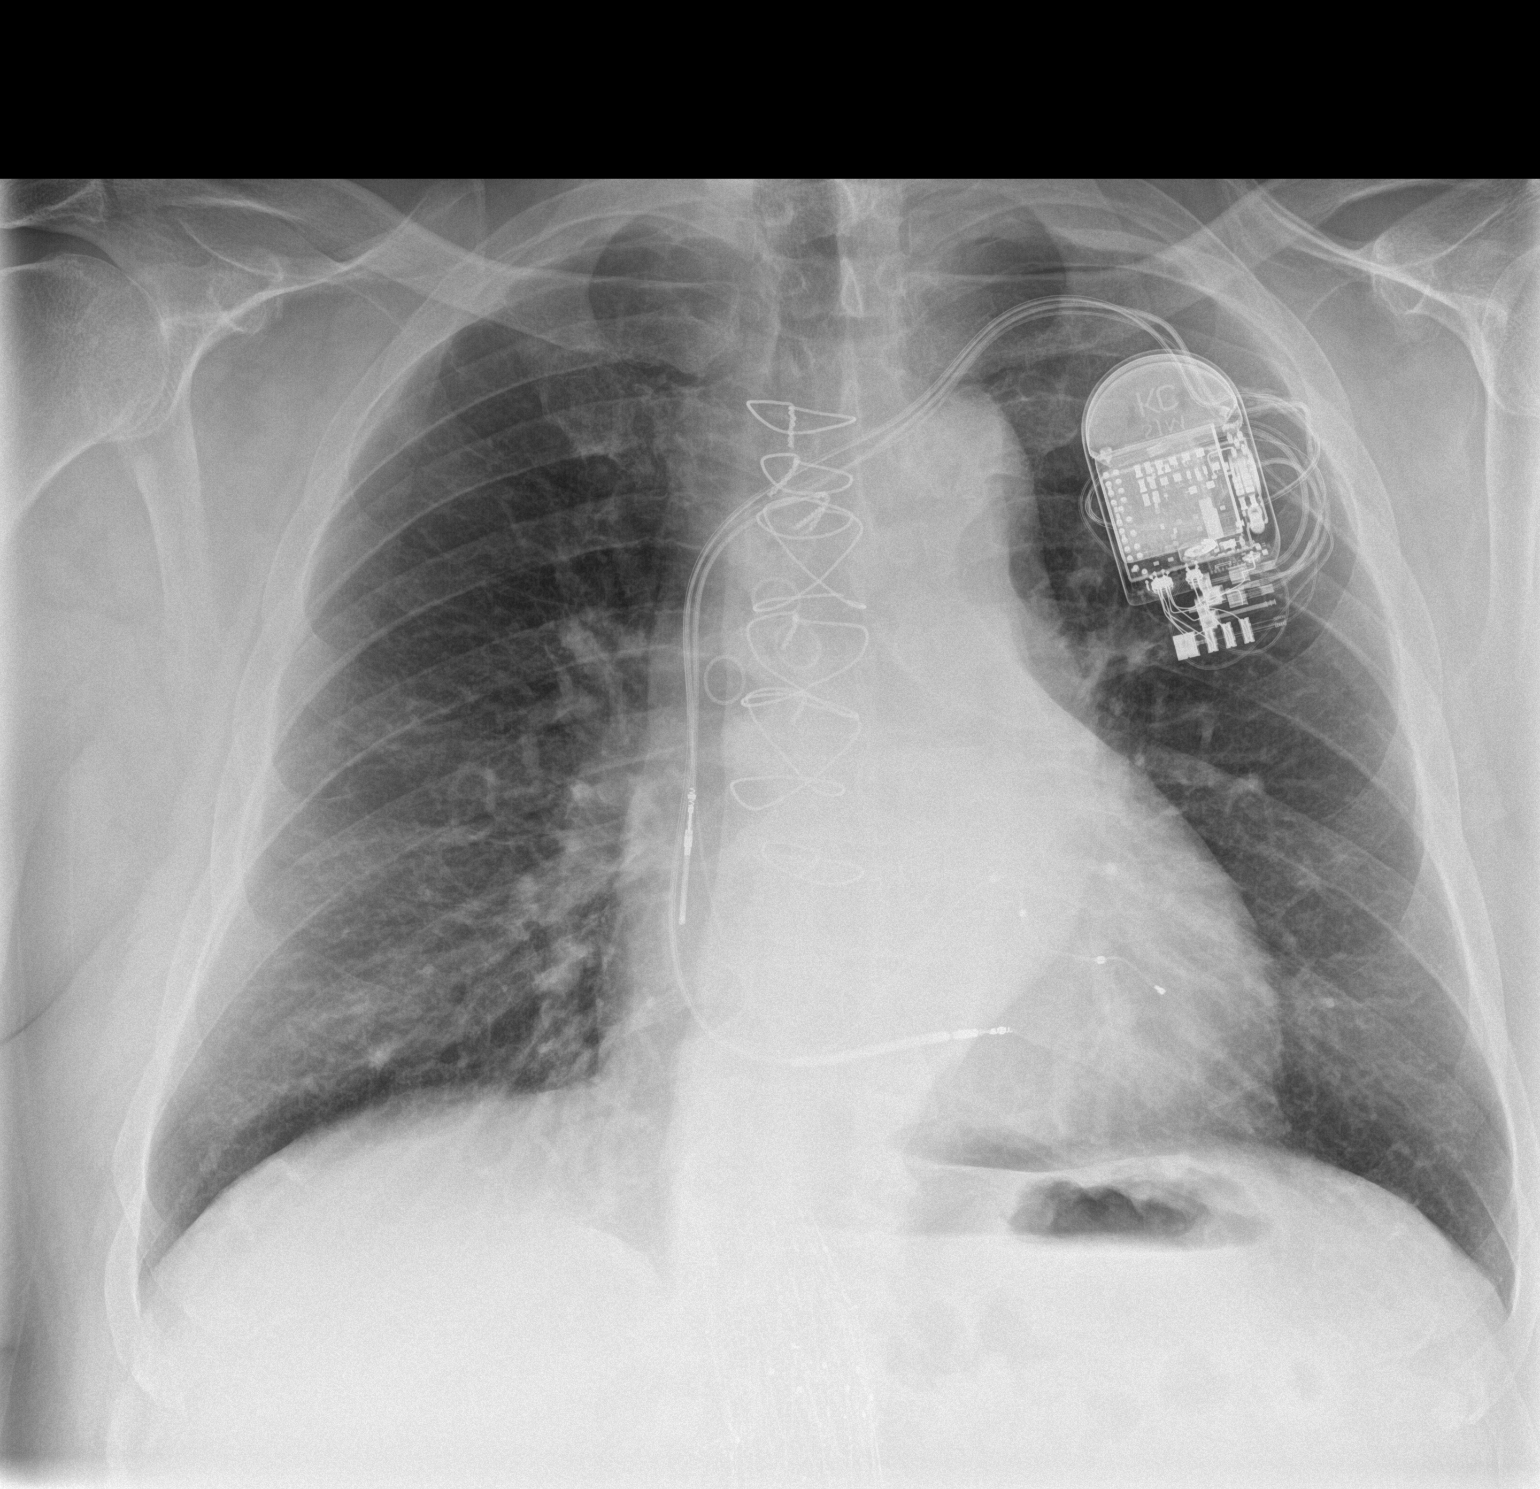

[chest lat]
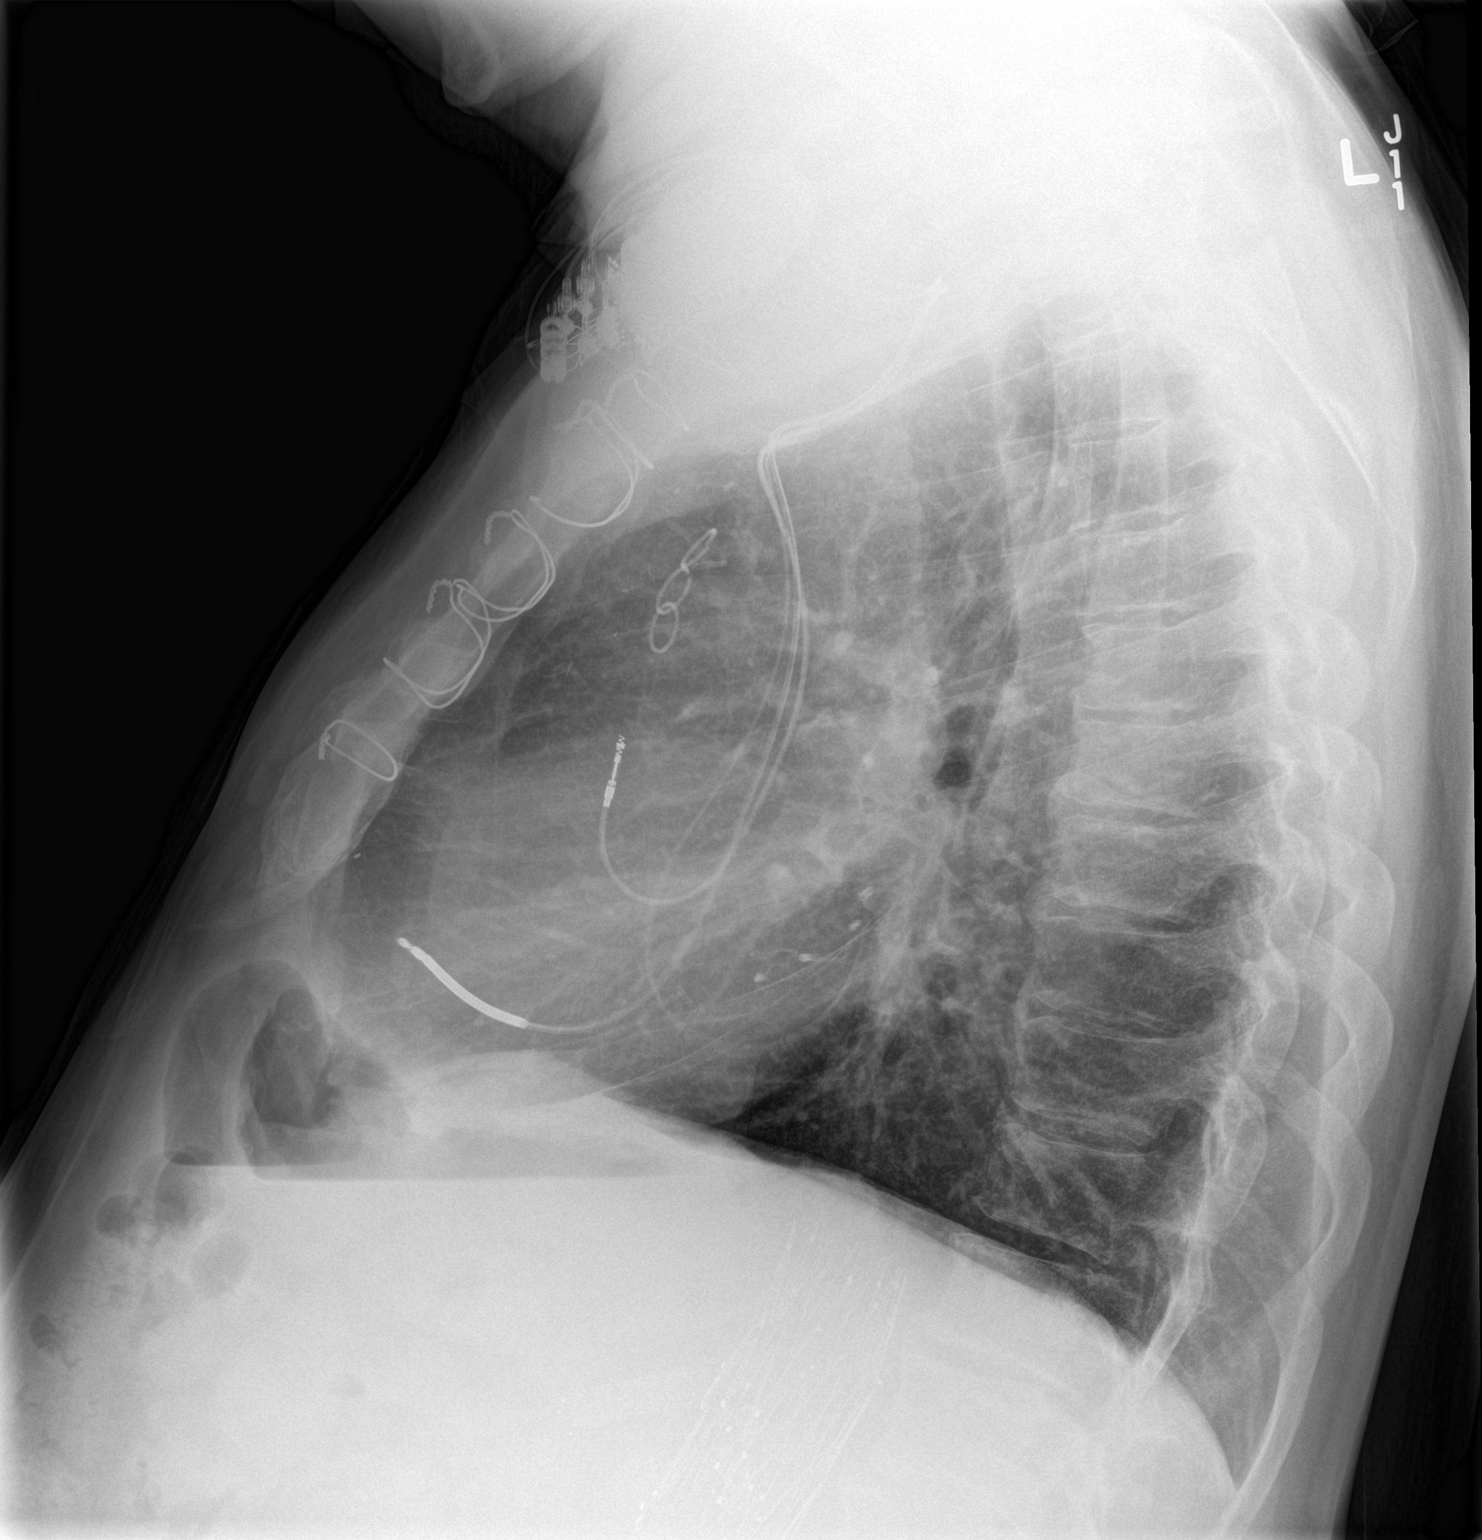

[2 of 2 positions shown; findings below may reference images not displayed]

FINDINGS: Left AICD in place. Evidence of CABG. Moderate enlargement of the
cardiomediastinal silhouette noted. Mild central vascular congestion
without overt edema. No pleural effusion or focal pulmonary opacity.
No acute osseous finding. Aortic stent graft partly visualized over
the upper abdomen.
IMPRESSION: Cardiomegaly without focal acute finding.

## 2016-04-18 DIAGNOSIS — E119 Type 2 diabetes mellitus without complications: Secondary | ICD-10-CM | POA: Diagnosis not present

## 2016-04-18 DIAGNOSIS — E669 Obesity, unspecified: Secondary | ICD-10-CM | POA: Diagnosis not present

## 2016-04-18 DIAGNOSIS — Z6834 Body mass index (BMI) 34.0-34.9, adult: Secondary | ICD-10-CM | POA: Diagnosis not present

## 2016-05-01 NOTE — Progress Notes (Signed)
Cardiology Office Note  Date: 05/02/2016   ID: DEVRIN MONFORTE, DOB [birth year], MRN [id number]  PCP: Doree Albee, MD  Primary Cardiologist: Rozann Lesches, MD   Chief Complaint  Patient presents with  . Coronary Artery Disease    History of Present Illness: Jahmai Finelli Kohlbeck is a medically complex 70 y.o. male last seen in August. He presents for a follow-up visit. He is now following with Dr. Anastasio Champion for primary care, tells that his blood sugars are much better controlled. He is on a low carbohydrate diet and has been trying to lose weight. Also continues to follow with Dr. Lowanda Foster with advancing renal insufficiency.  He states that his blood pressure has been trending up. He reports compliance to his medications. Current regimen includes aspirin, Norvasc, Coreg, Lasix, potassium supplements, Zocor, and as needed nitroglycerin.  Echocardiogram from earlier in the year revealed LVEF 35-40% range. He continues to follow with Dr. Lovena Le with a [employer]. Jude biventricular ICD in place. He has had no device shocks. No palpitations. No syncope.  Past Medical History:  Diagnosis Date  . AAA (abdominal aortic aneurysm) ([place])    Followed by Dr. Donnetta Hutching  . AICD (automatic cardioverter/defibrillator) present   . Angioedema    Secondary to ACE inhibitor  . Arthritis   . Bell palsy   . Carotid artery disease ([place])   . Coronary atherosclerosis of native coronary artery    Multivessel status post CABG  . Essential hypertension, benign   . Gout   . Hernia of abdominal wall   . Hypercholesteremia   . Ischemic cardiomyopathy    LVEF 45-50% 11/2011  . Migraines   . Myocardial infarction   . Nephrolithiasis   . PAD (peripheral artery disease) ([place])    Followed by Dr. Donnetta Hutching  . Sleep apnea   . Type 2 diabetes mellitus ([place])     Past Surgical History:  Procedure Laterality Date  . ABDOMINAL AORTIC ANEURYSM REPAIR  November 19, 2013   [facility] :  Dr. Sammuel Hines  . ABDOMINAL AORTIC  ANEURYSM REPAIR  09-07-2014   [facility]  . BI-VENTRICULAR IMPLANTABLE CARDIOVERTER DEFIBRILLATOR  (CRT-D)  11/05/2014  . BYPASS GRAFT POPLITEAL TO POPLITEAL Left 06/08/2014   Procedure: BYPASS GRAFT FEMORAL ARTERY TO ABOVE KNEE POPLITEAL;  Surgeon: Rosetta Posner, MD;  Location: [place];  Service: Vascular;  Laterality: Left;  . CARDIAC CATHETERIZATION N/A 10/02/2014   Procedure: Left Heart Cath and Cors/Grafts Angiography;  Surgeon: Belva Crome, MD;  Location: [place] [facility];  Service: Cardiovascular;  Laterality: N/A;  . CARDIAC CATHETERIZATION  "several"  . CATARACT EXTRACTION W/PHACO  03/23/2011   Procedure: CATARACT EXTRACTION PHACO AND INTRAOCULAR LENS PLACEMENT (IOC);  Surgeon: Tonny Branch;  [facility]: [facility];  Service: Ophthalmology;  Laterality: Left;  CDE: 15.99  . CATARACT EXTRACTION W/PHACO  04/17/2011   Procedure: CATARACT EXTRACTION PHACO AND INTRAOCULAR LENS PLACEMENT (IOC);  Surgeon: Tonny Branch;  [facility]: [facility];  Service: Ophthalmology;  Laterality: Right;  CDE: 23.45  . CORONARY ARTERY BYPASS GRAFT  2001   LIMA to LAD, SVG to diagonal and ramus, SVG to OM, SVG to AM  . DUPUYTREN CONTRACTURE RELEASE Left 2009  . EP IMPLANTABLE DEVICE N/A 11/05/2014   Procedure: BiV ICD Insertion CRT-D;  Surgeon: Evans Lance, MD;  Location: [place] [facility];  Service: Cardiovascular;  Laterality: N/A;  . EP IMPLANTABLE DEVICE N/A 02/24/2015   Procedure: Lead Revision/Repair;  Surgeon: Evans Lance, MD;  Location: [place] [facility];  Service: Cardiovascular;  Laterality: N/A;  . EXTRACORPOREAL SHOCK WAVE LITHOTRIPSY  X 1  . EYE SURGERY    . FOOT SURGERY Left 1963   "gangrene"  . ICD LEAD REMOVAL  02/24/2015  . LOWER EXTREMITY ANGIOGRAM Bilateral 04/28/2015   Procedure: Lower Extremity Angiogram;  Surgeon: Rosetta Posner, MD;  Location: [place] [facility];  Service: Cardiovascular;  Laterality: Bilateral;  . PERIPHERAL VASCULAR CATHETERIZATION N/A 04/28/2015   Procedure: Abdominal  Aortogram;  Surgeon: Rosetta Posner, MD;  Location: [place] [facility];  Service: Cardiovascular;  Laterality: N/A;    Current Outpatient Prescriptions  Medication Sig Dispense Refill  . acetaminophen (TYLENOL) 500 MG tablet Take 1,000 mg by mouth once as needed for mild pain, moderate pain or headache (pain).     Marland Kitchen allopurinol (ZYLOPRIM) 100 MG tablet Take 100 mg by mouth daily with supper.     Marland Kitchen aspirin EC 81 MG tablet Take 81 mg by mouth daily.    . carvedilol (COREG) 25 MG tablet Take 1 tablet (25 mg total) by mouth 2 (two) times daily. 60 tablet 6  . furosemide (LASIX) 80 MG tablet Take 1 tablet (80 mg total) by mouth 2 (two) times daily. 60 tablet 6  . gabapentin (NEURONTIN) 300 MG capsule Take 300 mg by mouth 2 (two) times daily.    Marland Kitchen glipiZIDE (GLUCOTROL XL) 10 MG 24 hr tablet Take 10 mg by mouth 2 (two) times daily.     . isosorbide mononitrate (IMDUR) 60 MG 24 hr tablet Take 30-120 mg by mouth 2 (two) times daily. 120 mg am, 30 mg pm    . linagliptin (TRADJENTA) 5 MG TABS tablet Take 5 mg by mouth daily.      . nitroGLYCERIN (NITROSTAT) 0.4 MG SL tablet PLACE 0.4 MG  UNDER THE TONGUE EVERY 5 MINUTES UP TO 3 DOSES AS NEEDED FOR CHEST PAIN. 25 tablet 3  . potassium chloride (K-DUR) 10 MEQ tablet Take 2 tablets (20 mEq total) by mouth daily. 60 tablet 6  . simvastatin (ZOCOR) 20 MG tablet Take 1 tablet (20 mg total) by mouth at bedtime. 30 tablet 6  . amLODipine (NORVASC) 5 MG tablet Take 1 tablet (5 mg total) by mouth daily. 90 tablet 3   No current facility-administered medications for this visit.    Allergies:  Ace inhibitors; Codeine; Lisinopril; and Hydromorphone   Social History: The patient  reports that he quit smoking about 8 years ago. His smoking use included Cigarettes. He started smoking about 37 years ago. He has a 25.00 pack-year smoking history. He has never used smokeless tobacco. He reports that he does not drink alcohol or use drugs.   ROS:  Please see the history of  present illness. Otherwise, complete review of systems is positive for intermittent leg edema.  All other systems are reviewed and negative.   Physical Exam: VS:  BP (!) 144/68   Pulse 60   Ht 6\' 1"  (1.854 m)   Wt 248 lb (112.5 kg)   SpO2 95%   BMI 32.72 kg/m , BMI Body mass index is 32.72 kg/m.  Wt Readings from Last 3 Encounters:  05/02/16 248 lb (112.5 kg)  02/24/16 249 lb (112.9 kg)  01/20/16 256 lb 4.8 oz (116.3 kg)    General: Obese male, appears comfortable at rest. HEENT: Conjunctiva and lids normal, oropharynx clear. Neck: Supple, elevated JVP, no carotid bruits, no thyromegaly. Thorax: Well-healed device pocket site on left. Lungs:  Decreased breath sounds, nonlabored breathing at rest. Cardiac: Indistinct PMI, regular rate and rhythm, no S3, no pericardial rub. Abdomen: Protuberant, nontender, bowel sounds present. Extremities: 1+ edema, distal pulses diminished. Skin: Warm and dry. Musculoskeletal: No kyphosis. Neuropsychiatric: Alert and oriented 3, affect appropriate.  ECG: I personally reviewed the tracing from 01/20/2016 which showed a ventricular paced rhythm with atrial sensing.  Recent Labwork: 09/23/2015: BUN 29; Creat 1.92; Potassium 3.8; Sodium 141     Component Value Date/Time   CHOL 118 10/16/2014 0715   TRIG 100 10/16/2014 0715   HDL 33 (L) 10/16/2014 0715   CHOLHDL 3.6 10/16/2014 0715   VLDL 20 10/16/2014 0715   LDLCALC 65 10/16/2014 0715    Other Studies Reviewed Today:  Cardiac catheterization 10/02/2014:  Bypass graft failure with total occlusion of the saphenous vein graft to the acute marginal of the right coronary, total occlusion of the sequential saphenous vein graft to the diagonal and ramus intermedius, and total occlusion of the saphenous graft to the distal circumflex/obtuse marginal.  Patent LIMA to the LAD.  Total occlusion of the native LAD, the mid right coronary, second obtuse marginal, the first diagonal, and the ramus  intermedius.  The distal right coronary, the obtuse marginal, ramus intermedius are collateralized from the native circulation via the LIMA to LAD.  Severe left ventricular systolic dysfunction with an inferobasal aneurysm, and an ejection fraction of 20-25%.  Echocardiogram 06/28/2015: Study Conclusions  - Procedure narrative: Transthoracic echocardiography. Image quality was fair. The study was technically difficult, as a result of poor sound wave transmission. Intravenous contrast (Definity) was administered. - Left ventricle: The cavity size was normal. Systolic function was moderately reduced. The estimated ejection fraction was in the range of 35% to 40%. Diffusely hypokinetic. Doppler parameters are consistent with abnormal left ventricular relaxation (grade 1 diastolic dysfunction). Doppler parameters are consistent with high ventricular filling pressure. Mild to moderate concentric left ventricular hypertrophy. - Regional wall motion abnormality: Severe hypokinesis of the mid inferior myocardium; mild hypokinesis of the mid inferoseptal myocardium. - Ventricular septum: Septal motion showed abnormal function and dyssynergy. These changes are consistent with right ventricular pacing. - Aortic valve: Poorly visualized. Mildly thickened, mildly calcified leaflets. There may be mild aortic valve stenosis. - Mitral valve: Calcified annulus. - Left atrium: The atrium was mildly dilated. - Right ventricle: Pacer wire or catheter noted in right ventricle.  Assessment and Plan:  1. CAD status post CABG with subsequently documented graft disease. He is symptomatically stable in terms of angina control on medical therapy.  2. Ischemic cardiomyopathy with LVEF 35-40%. Status post St. Jude biventricular ICD with follow-up per Dr. Lovena Le. Continues on Coreg, Lasix, ataxia see him supplements. Also on Norvasc, no ARB with ACE inhibitor allergy and progressive  renal disease.  3. Essential hypertension, blood pressure trending up. We will increase Norvasc to 5 mg daily.  4. Hyperlipidemia, continues on Zocor.  Current medicines were reviewed with the patient today.  Disposition: Follow-up in 6 months.   Signed, Satira Sark, MD, [facility] 05/02/2016 3:22 PM    [place] [facility] [place], [place],  [address] Phone: [phone]; Fax: [phone]

## 2016-05-02 ENCOUNTER — Ambulatory Visit (INDEPENDENT_AMBULATORY_CARE_PROVIDER_SITE_OTHER): Payer: Medicare Other | Admitting: Cardiology

## 2016-05-02 ENCOUNTER — Encounter: Payer: Self-pay | Admitting: Vascular Surgery

## 2016-05-02 ENCOUNTER — Encounter: Payer: Self-pay | Admitting: Cardiology

## 2016-05-02 VITALS — BP 144/68 | HR 60 | Ht 73.0 in | Wt 248.0 lb

## 2016-05-02 DIAGNOSIS — I10 Essential (primary) hypertension: Secondary | ICD-10-CM | POA: Diagnosis not present

## 2016-05-02 DIAGNOSIS — I255 Ischemic cardiomyopathy: Secondary | ICD-10-CM | POA: Diagnosis not present

## 2016-05-02 DIAGNOSIS — I25119 Atherosclerotic heart disease of native coronary artery with unspecified angina pectoris: Secondary | ICD-10-CM

## 2016-05-02 DIAGNOSIS — E782 Mixed hyperlipidemia: Secondary | ICD-10-CM | POA: Diagnosis not present

## 2016-05-02 DIAGNOSIS — I209 Angina pectoris, unspecified: Secondary | ICD-10-CM

## 2016-05-02 MED ORDER — AMLODIPINE BESYLATE 5 MG PO TABS: 5.0000 mg | ORAL_TABLET | Freq: Every day | ORAL | 3 refills | Status: DC

## 2016-05-02 NOTE — Patient Instructions (Signed)
Your physician wants you to follow-up in: 4 MONTHS WITH DR. Domenic Polite You will receive a reminder letter in the mail two months in advance. If you don't receive a letter, please call our office to schedule the follow-up appointment.  Your physician has recommended you make the following change in your medication:   INCREASE AMLODIPINE 5 MG DAILY  Thank you for choosing [facility]!!

## 2016-05-04 DIAGNOSIS — T82858A Stenosis of vascular prosthetic devices, implants and grafts, initial encounter: Secondary | ICD-10-CM | POA: Diagnosis not present

## 2016-05-04 DIAGNOSIS — I2510 Atherosclerotic heart disease of native coronary artery without angina pectoris: Secondary | ICD-10-CM | POA: Diagnosis not present

## 2016-05-04 DIAGNOSIS — Z79899 Other long term (current) drug therapy: Secondary | ICD-10-CM | POA: Diagnosis not present

## 2016-05-04 DIAGNOSIS — I255 Ischemic cardiomyopathy: Secondary | ICD-10-CM | POA: Diagnosis not present

## 2016-05-04 DIAGNOSIS — Z87891 Personal history of nicotine dependence: Secondary | ICD-10-CM | POA: Diagnosis not present

## 2016-05-04 DIAGNOSIS — Z951 Presence of aortocoronary bypass graft: Secondary | ICD-10-CM | POA: Diagnosis not present

## 2016-05-04 DIAGNOSIS — E785 Hyperlipidemia, unspecified: Secondary | ICD-10-CM | POA: Diagnosis not present

## 2016-05-04 DIAGNOSIS — I252 Old myocardial infarction: Secondary | ICD-10-CM | POA: Diagnosis not present

## 2016-05-04 DIAGNOSIS — H9319 Tinnitus, unspecified ear: Secondary | ICD-10-CM | POA: Diagnosis not present

## 2016-05-04 DIAGNOSIS — Z7982 Long term (current) use of aspirin: Secondary | ICD-10-CM | POA: Diagnosis not present

## 2016-05-04 DIAGNOSIS — G4733 Obstructive sleep apnea (adult) (pediatric): Secondary | ICD-10-CM | POA: Diagnosis not present

## 2016-05-04 DIAGNOSIS — I739 Peripheral vascular disease, unspecified: Secondary | ICD-10-CM | POA: Diagnosis not present

## 2016-05-04 DIAGNOSIS — E1151 Type 2 diabetes mellitus with diabetic peripheral angiopathy without gangrene: Secondary | ICD-10-CM | POA: Diagnosis not present

## 2016-05-04 DIAGNOSIS — Z794 Long term (current) use of insulin: Secondary | ICD-10-CM | POA: Diagnosis not present

## 2016-05-04 DIAGNOSIS — I4891 Unspecified atrial fibrillation: Secondary | ICD-10-CM | POA: Diagnosis not present

## 2016-05-04 DIAGNOSIS — I714 Abdominal aortic aneurysm, without rupture: Secondary | ICD-10-CM | POA: Diagnosis not present

## 2016-05-04 DIAGNOSIS — I509 Heart failure, unspecified: Secondary | ICD-10-CM | POA: Diagnosis not present

## 2016-05-04 DIAGNOSIS — I110 Hypertensive heart disease with heart failure: Secondary | ICD-10-CM | POA: Diagnosis not present

## 2016-05-04 DIAGNOSIS — I716 Thoracoabdominal aortic aneurysm, without rupture: Secondary | ICD-10-CM | POA: Diagnosis not present

## 2016-05-09 ENCOUNTER — Ambulatory Visit: Payer: Medicare Other | Admitting: Vascular Surgery

## 2016-05-09 ENCOUNTER — Ambulatory Visit (INDEPENDENT_AMBULATORY_CARE_PROVIDER_SITE_OTHER)
Admission: RE | Admit: 2016-05-09 | Discharge: 2016-05-09 | Disposition: A | Discharge status: Home / Self Care | Payer: Medicare Other | Source: Ambulatory Visit | Attending: Vascular Surgery | Admitting: Vascular Surgery

## 2016-05-09 ENCOUNTER — Encounter (HOSPITAL_COMMUNITY): Payer: Medicare Other

## 2016-05-09 ENCOUNTER — Encounter: Payer: Self-pay | Admitting: Vascular Surgery

## 2016-05-09 ENCOUNTER — Ambulatory Visit (INDEPENDENT_AMBULATORY_CARE_PROVIDER_SITE_OTHER): Payer: Medicare Other | Admitting: Vascular Surgery

## 2016-05-09 ENCOUNTER — Ambulatory Visit (HOSPITAL_COMMUNITY)
Admission: RE | Admit: 2016-05-09 | Discharge: 2016-05-09 | Disposition: A | Discharge status: Home / Self Care | Payer: Medicare Other | Source: Ambulatory Visit | Attending: Vascular Surgery | Admitting: Vascular Surgery

## 2016-05-09 VITALS — BP 115/55 | HR 60 | Temp 98.0°F | Resp 16 | Ht 73.0 in | Wt 243.0 lb

## 2016-05-09 DIAGNOSIS — I739 Peripheral vascular disease, unspecified: Secondary | ICD-10-CM | POA: Diagnosis not present

## 2016-05-09 DIAGNOSIS — I255 Ischemic cardiomyopathy: Secondary | ICD-10-CM | POA: Diagnosis not present

## 2016-05-09 NOTE — Addendum Note (Signed)
Addended by: Lianne Cure A on: 05/09/2016 03:28 PM   Modules accepted: Orders

## 2016-05-09 NOTE — Progress Notes (Signed)
Vascular and Vein Specialist of [facility]  Patient name: [name] MRN: [id number] DOB: 18-Sep-1945 Sex: male  REASON FOR VISIT: Here for follow-up of diffuse peripheral vascular disease. He is status post left femoral to above-knee popliteal bypass with non-reversed great saphenous vein January 2016. He has known stenosis arteriogram at the level of the distal anastomosis. He has a fenestrated stent graft for juxtarenal aneurysm placed at [facility]. Felt that he would require an antegrade stick for any type of endovascular intervention to this stenosis. This would be a technically difficult and was felt that observation would be the best option. He continues to complain of mainly bilateral hip discomfort with walking. If he continues to push she can have some calf discomfort as well. He does have known superficial femoral artery occlusion on the right. He has a remote history of left great toe amputation. No new tissue loss. He does have a significant cardiac disease as well.    Past Medical History:  Diagnosis Date  . AAA (abdominal aortic aneurysm) ([place])    Followed by Dr. Donnetta Hutching  . AICD (automatic cardioverter/defibrillator) present   . Angioedema    Secondary to ACE inhibitor  . Arthritis   . Bell palsy   . Carotid artery disease ([place])   . Coronary atherosclerosis of native coronary artery    Multivessel status post CABG  . Essential hypertension, benign   . Gout   . Hernia of abdominal wall   . Hypercholesteremia   . Ischemic cardiomyopathy    LVEF 45-50% 11/2011  . Migraines   . Myocardial infarction   . Nephrolithiasis   . PAD (peripheral artery disease) ([place])    Followed by Dr. Donnetta Hutching  . Sleep apnea   . Type 2 diabetes mellitus (HCC)     Family History  Problem Relation Age of Onset  . Diabetes Mother     Type  I   . Varicose Veins Mother   . Heart disease Father 74    AAA  . AAA (abdominal aortic  aneurysm) Father   . Hyperlipidemia Father   . Hypertension Father     SOCIAL HISTORY: Social History  Substance Use Topics  . Smoking status: Former Smoker    Packs/day: 1.00    Years: 25.00    Types: Cigarettes    Start date: 02/28/1979    Quit date: 05/23/2007  . Smokeless tobacco: Never Used  . Alcohol use No    Allergies  Allergen Reactions  . Ace Inhibitors Anaphylaxis  . Codeine Other (See Comments)     delirium  . Lisinopril Anaphylaxis and Swelling  . Hydromorphone Nausea And Vomiting    Current Outpatient Prescriptions  Medication Sig Dispense Refill  . acetaminophen (TYLENOL) 500 MG tablet Take 1,000 mg by mouth once as needed for mild pain, moderate pain or headache (pain).     Marland Kitchen allopurinol (ZYLOPRIM) 100 MG tablet Take 100 mg by mouth daily with supper.     Marland Kitchen amLODipine (NORVASC) 5 MG tablet Take 1 tablet (5 mg total) by mouth daily. 30 tablet 3  . aspirin EC 81 MG tablet Take 81 mg by mouth daily.    . carvedilol (COREG) 25 MG tablet Take 1 tablet (25 mg total) by mouth 2 (two) times daily. 60 tablet 6  . furosemide (LASIX) 80 MG tablet Take 1 tablet (80 mg total) by mouth 2 (two) times daily. 60 tablet 6  . gabapentin (NEURONTIN) 300 MG capsule Take 300 mg  by mouth 2 (two) times daily.    Marland Kitchen glipiZIDE (GLUCOTROL XL) 10 MG 24 hr tablet Take 10 mg by mouth 2 (two) times daily.     . isosorbide mononitrate (IMDUR) 60 MG 24 hr tablet Take 30-120 mg by mouth 2 (two) times daily. 120 mg am, 30 mg pm    . linagliptin (TRADJENTA) 5 MG TABS tablet Take 5 mg by mouth daily.      . nitroGLYCERIN (NITROSTAT) 0.4 MG SL tablet PLACE 0.4 MG  UNDER THE TONGUE EVERY 5 MINUTES UP TO 3 DOSES AS NEEDED FOR CHEST PAIN. 25 tablet 3  . potassium chloride (K-DUR) 10 MEQ tablet Take 2 tablets (20 mEq total) by mouth daily. 60 tablet 6  . simvastatin (ZOCOR) 20 MG tablet Take 1 tablet (20 mg total) by mouth at bedtime. 30 tablet 6   No current facility-administered medications for this  visit.     REVIEW OF SYSTEMS:  [X]  denotes positive finding, [ ]  denotes negative finding Cardiac  Comments:  Chest pain or chest pressure:    Shortness of breath upon exertion:    Short of breath when lying flat:    Irregular heart rhythm:        Vascular    Pain in calf, thigh, or hip brought on by ambulation: x   Pain in feet at night that wakes you up from your sleep:     Blood clot in your veins:    Leg swelling:  x         PHYSICAL EXAM: Vitals:   05/09/16 0916  BP: (!) 115/55  Pulse: 60  Resp: 16  Temp: 98 F (36.7 C)  TempSrc: Oral  SpO2: 96%  Weight: 243 lb (110.2 kg)  Height: 6\' 1"  (1.854 m)    GENERAL: The patient is a well-nourished male, in no acute distress. The vital signs are documented above. CARDIOVASCULAR: 2+ femoral pulses bilaterally. He does have a 2+ left dorsalis pedis pulse and palpable popliteal pulse. I do not palpate pedal pulses on the right PULMONARY: There is good air exchange  MUSCULOSKELETAL: There are no major deformities or cyanosis. NEUROLOGIC: No focal weakness or paresthesias are detected. SKIN: There are no ulcers or rashes noted. PSYCHIATRIC: The patient has a normal affect.  DATA:  Noninvasive studies today reveal ankle arm index of 0.94 on the left and 0.52 on the right. He does have elevated velocities at the level of the distal anastomosis of his vein graft on the left.  MEDICAL ISSUES: His main complaint continues to be of bilateral hip discomfort. He does report that he had significant spinal issues at a younger age. He has had no workup to rule out primary hip difficulty as the cause for his hip pain. Expanded be unlikely but may be related to decreased flow to his hypogastric arteries from his prior stent graft repair as a cause of this. He has seen Dr. Ples Specter for other knee and ankle issues recently. We will refer him to Dr. Aline Brochure for evaluation of his hip discomfort as well. I will see the patient in 6 months.  Again reviewed symptoms of occluded graft and he'll notify us should this occur    Rosetta Posner, MD [facility] Vascular and Vein Specialists of [facility] Tel [phone] Pager [phone]

## 2016-05-10 DIAGNOSIS — E119 Type 2 diabetes mellitus without complications: Secondary | ICD-10-CM | POA: Diagnosis not present

## 2016-05-10 DIAGNOSIS — Z6834 Body mass index (BMI) 34.0-34.9, adult: Secondary | ICD-10-CM | POA: Diagnosis not present

## 2016-05-23 ENCOUNTER — Encounter (HOSPITAL_COMMUNITY): Payer: Medicare Other

## 2016-05-23 ENCOUNTER — Ambulatory Visit: Payer: Medicare Other | Admitting: Vascular Surgery

## 2016-05-24 DIAGNOSIS — E119 Type 2 diabetes mellitus without complications: Secondary | ICD-10-CM | POA: Diagnosis not present

## 2016-05-24 DIAGNOSIS — Z6834 Body mass index (BMI) 34.0-34.9, adult: Secondary | ICD-10-CM | POA: Diagnosis not present

## 2016-05-25 ENCOUNTER — Ambulatory Visit (INDEPENDENT_AMBULATORY_CARE_PROVIDER_SITE_OTHER): Payer: Medicare Other | Admitting: *Deleted

## 2016-05-25 DIAGNOSIS — I255 Ischemic cardiomyopathy: Secondary | ICD-10-CM | POA: Diagnosis not present

## 2016-05-25 NOTE — Progress Notes (Signed)
Remote ICD transmission.   

## 2016-05-26 ENCOUNTER — Encounter: Payer: Self-pay | Admitting: Cardiology

## 2016-05-26 DIAGNOSIS — E559 Vitamin D deficiency, unspecified: Secondary | ICD-10-CM | POA: Diagnosis not present

## 2016-05-26 DIAGNOSIS — I129 Hypertensive chronic kidney disease with stage 1 through stage 4 chronic kidney disease, or unspecified chronic kidney disease: Secondary | ICD-10-CM | POA: Diagnosis not present

## 2016-05-26 DIAGNOSIS — R809 Proteinuria, unspecified: Secondary | ICD-10-CM | POA: Diagnosis not present

## 2016-05-26 DIAGNOSIS — D509 Iron deficiency anemia, unspecified: Secondary | ICD-10-CM | POA: Diagnosis not present

## 2016-05-26 DIAGNOSIS — Z79899 Other long term (current) drug therapy: Secondary | ICD-10-CM | POA: Diagnosis not present

## 2016-05-26 DIAGNOSIS — N183 Chronic kidney disease, stage 3 (moderate): Secondary | ICD-10-CM | POA: Diagnosis not present

## 2016-05-30 DIAGNOSIS — R809 Proteinuria, unspecified: Secondary | ICD-10-CM | POA: Diagnosis not present

## 2016-05-30 DIAGNOSIS — I10 Essential (primary) hypertension: Secondary | ICD-10-CM | POA: Diagnosis not present

## 2016-05-30 DIAGNOSIS — N183 Chronic kidney disease, stage 3 (moderate): Secondary | ICD-10-CM | POA: Diagnosis not present

## 2016-06-06 DIAGNOSIS — I2510 Atherosclerotic heart disease of native coronary artery without angina pectoris: Secondary | ICD-10-CM | POA: Diagnosis not present

## 2016-06-06 DIAGNOSIS — I501 Left ventricular failure: Secondary | ICD-10-CM | POA: Diagnosis not present

## 2016-06-06 DIAGNOSIS — E785 Hyperlipidemia, unspecified: Secondary | ICD-10-CM | POA: Diagnosis not present

## 2016-06-06 DIAGNOSIS — E559 Vitamin D deficiency, unspecified: Secondary | ICD-10-CM | POA: Diagnosis not present

## 2016-06-06 DIAGNOSIS — I129 Hypertensive chronic kidney disease with stage 1 through stage 4 chronic kidney disease, or unspecified chronic kidney disease: Secondary | ICD-10-CM | POA: Diagnosis not present

## 2016-06-06 DIAGNOSIS — E119 Type 2 diabetes mellitus without complications: Secondary | ICD-10-CM | POA: Diagnosis not present

## 2016-06-06 DIAGNOSIS — E669 Obesity, unspecified: Secondary | ICD-10-CM | POA: Diagnosis not present

## 2016-06-06 DIAGNOSIS — I10 Essential (primary) hypertension: Secondary | ICD-10-CM | POA: Diagnosis not present

## 2016-06-06 DIAGNOSIS — G4730 Sleep apnea, unspecified: Secondary | ICD-10-CM | POA: Diagnosis not present

## 2016-06-12 LAB — CUP PACEART REMOTE DEVICE CHECK
Battery Remaining Longevity: 50 mo
Battery Remaining Percentage: 75 %
Battery Voltage: 2.95 V
Brady Statistic AP VP Percent: 21 %
Brady Statistic AS VP Percent: 74 %
HIGH POWER IMPEDANCE MEASURED VALUE: 70 Ohm
HighPow Impedance: 70 Ohm
Implantable Lead Implant Date: 20161005
Implantable Lead Location: 753860
Lead Channel Impedance Value: 460 Ohm
Lead Channel Impedance Value: 710 Ohm
Lead Channel Pacing Threshold Amplitude: 0.875 V
Lead Channel Pacing Threshold Pulse Width: 0.5 ms
Lead Channel Pacing Threshold Pulse Width: 1 ms
Lead Channel Sensing Intrinsic Amplitude: 6.8 mV
Lead Channel Setting Pacing Amplitude: 1.875
Lead Channel Setting Sensing Sensitivity: 0.5 mV
MDC IDC LEAD IMPLANT DT: 20160616
MDC IDC LEAD IMPLANT DT: 20161005
MDC IDC LEAD LOCATION: 753858
MDC IDC LEAD LOCATION: 753859
MDC IDC MSMT LEADCHNL LV PACING THRESHOLD AMPLITUDE: 1.875 V
MDC IDC MSMT LEADCHNL RA IMPEDANCE VALUE: 530 Ohm
MDC IDC MSMT LEADCHNL RA PACING THRESHOLD PULSEWIDTH: 0.5 ms
MDC IDC MSMT LEADCHNL RA SENSING INTR AMPL: 2.2 mV
MDC IDC MSMT LEADCHNL RV PACING THRESHOLD AMPLITUDE: 0.75 V
MDC IDC PG IMPLANT DT: 20160616
MDC IDC PG SERIAL: 7288352
MDC IDC SESS DTM: 20180104070019
MDC IDC SET LEADCHNL LV PACING AMPLITUDE: 2.875
MDC IDC SET LEADCHNL LV PACING PULSEWIDTH: 1 ms
MDC IDC SET LEADCHNL RV PACING AMPLITUDE: 2 V
MDC IDC SET LEADCHNL RV PACING PULSEWIDTH: 0.5 ms
MDC IDC STAT BRADY AP VS PERCENT: 1 %
MDC IDC STAT BRADY AS VS PERCENT: 2 %
MDC IDC STAT BRADY RA PERCENT PACED: 18 %

## 2016-06-13 DIAGNOSIS — Z6834 Body mass index (BMI) 34.0-34.9, adult: Secondary | ICD-10-CM | POA: Diagnosis not present

## 2016-06-14 ENCOUNTER — Encounter: Payer: Self-pay | Admitting: Cardiology

## 2016-06-22 DIAGNOSIS — I714 Abdominal aortic aneurysm, without rupture: Secondary | ICD-10-CM | POA: Diagnosis not present

## 2016-06-22 DIAGNOSIS — I716 Thoracoabdominal aortic aneurysm, without rupture: Secondary | ICD-10-CM | POA: Diagnosis not present

## 2016-06-22 DIAGNOSIS — I771 Stricture of artery: Secondary | ICD-10-CM | POA: Diagnosis not present

## 2016-06-22 DIAGNOSIS — I739 Peripheral vascular disease, unspecified: Secondary | ICD-10-CM | POA: Diagnosis not present

## 2016-06-22 DIAGNOSIS — I7092 Chronic total occlusion of artery of the extremities: Secondary | ICD-10-CM | POA: Diagnosis not present

## 2016-06-22 DIAGNOSIS — I724 Aneurysm of artery of lower extremity: Secondary | ICD-10-CM | POA: Diagnosis not present

## 2016-06-26 DIAGNOSIS — G4733 Obstructive sleep apnea (adult) (pediatric): Secondary | ICD-10-CM | POA: Diagnosis not present

## 2016-06-26 DIAGNOSIS — I10 Essential (primary) hypertension: Secondary | ICD-10-CM | POA: Diagnosis not present

## 2016-06-26 DIAGNOSIS — I509 Heart failure, unspecified: Secondary | ICD-10-CM | POA: Diagnosis not present

## 2016-06-28 DIAGNOSIS — Z6834 Body mass index (BMI) 34.0-34.9, adult: Secondary | ICD-10-CM | POA: Diagnosis not present

## 2016-06-28 DIAGNOSIS — E119 Type 2 diabetes mellitus without complications: Secondary | ICD-10-CM | POA: Diagnosis not present

## 2016-06-28 DIAGNOSIS — I10 Essential (primary) hypertension: Secondary | ICD-10-CM | POA: Diagnosis not present

## 2016-06-30 ENCOUNTER — Other Ambulatory Visit: Payer: Self-pay | Admitting: Cardiology

## 2016-07-18 DIAGNOSIS — E119 Type 2 diabetes mellitus without complications: Secondary | ICD-10-CM | POA: Diagnosis not present

## 2016-07-18 DIAGNOSIS — Z6834 Body mass index (BMI) 34.0-34.9, adult: Secondary | ICD-10-CM | POA: Diagnosis not present

## 2016-07-27 IMAGING — DX DG CHEST 2V
2 series · 2 of 2 positions shown · non-contrast
Comparison: 02/25/2015.

CLINICAL DATA: Pacemaker insertion.

EXAM:
CHEST  2 VIEW

[chest pa]
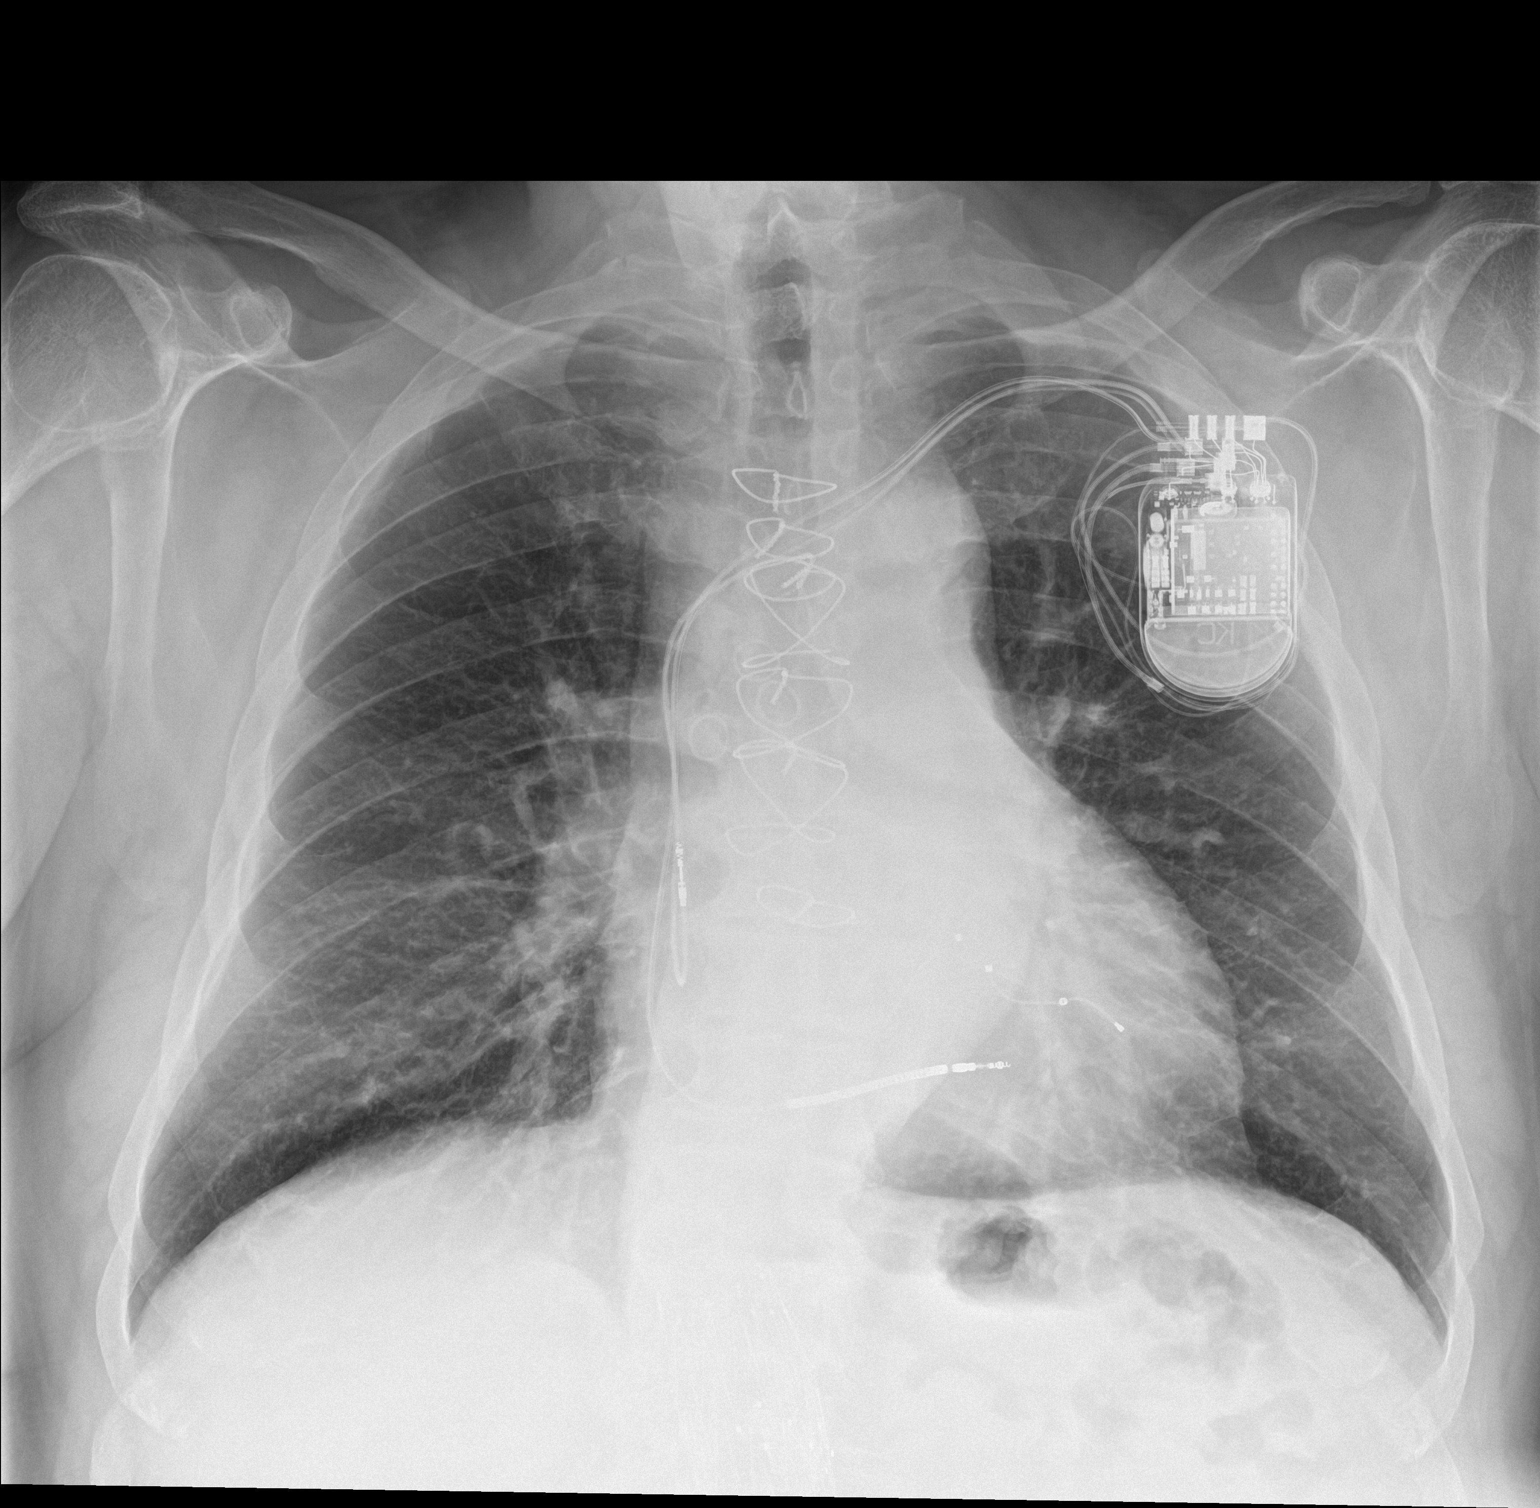

[chest lat]
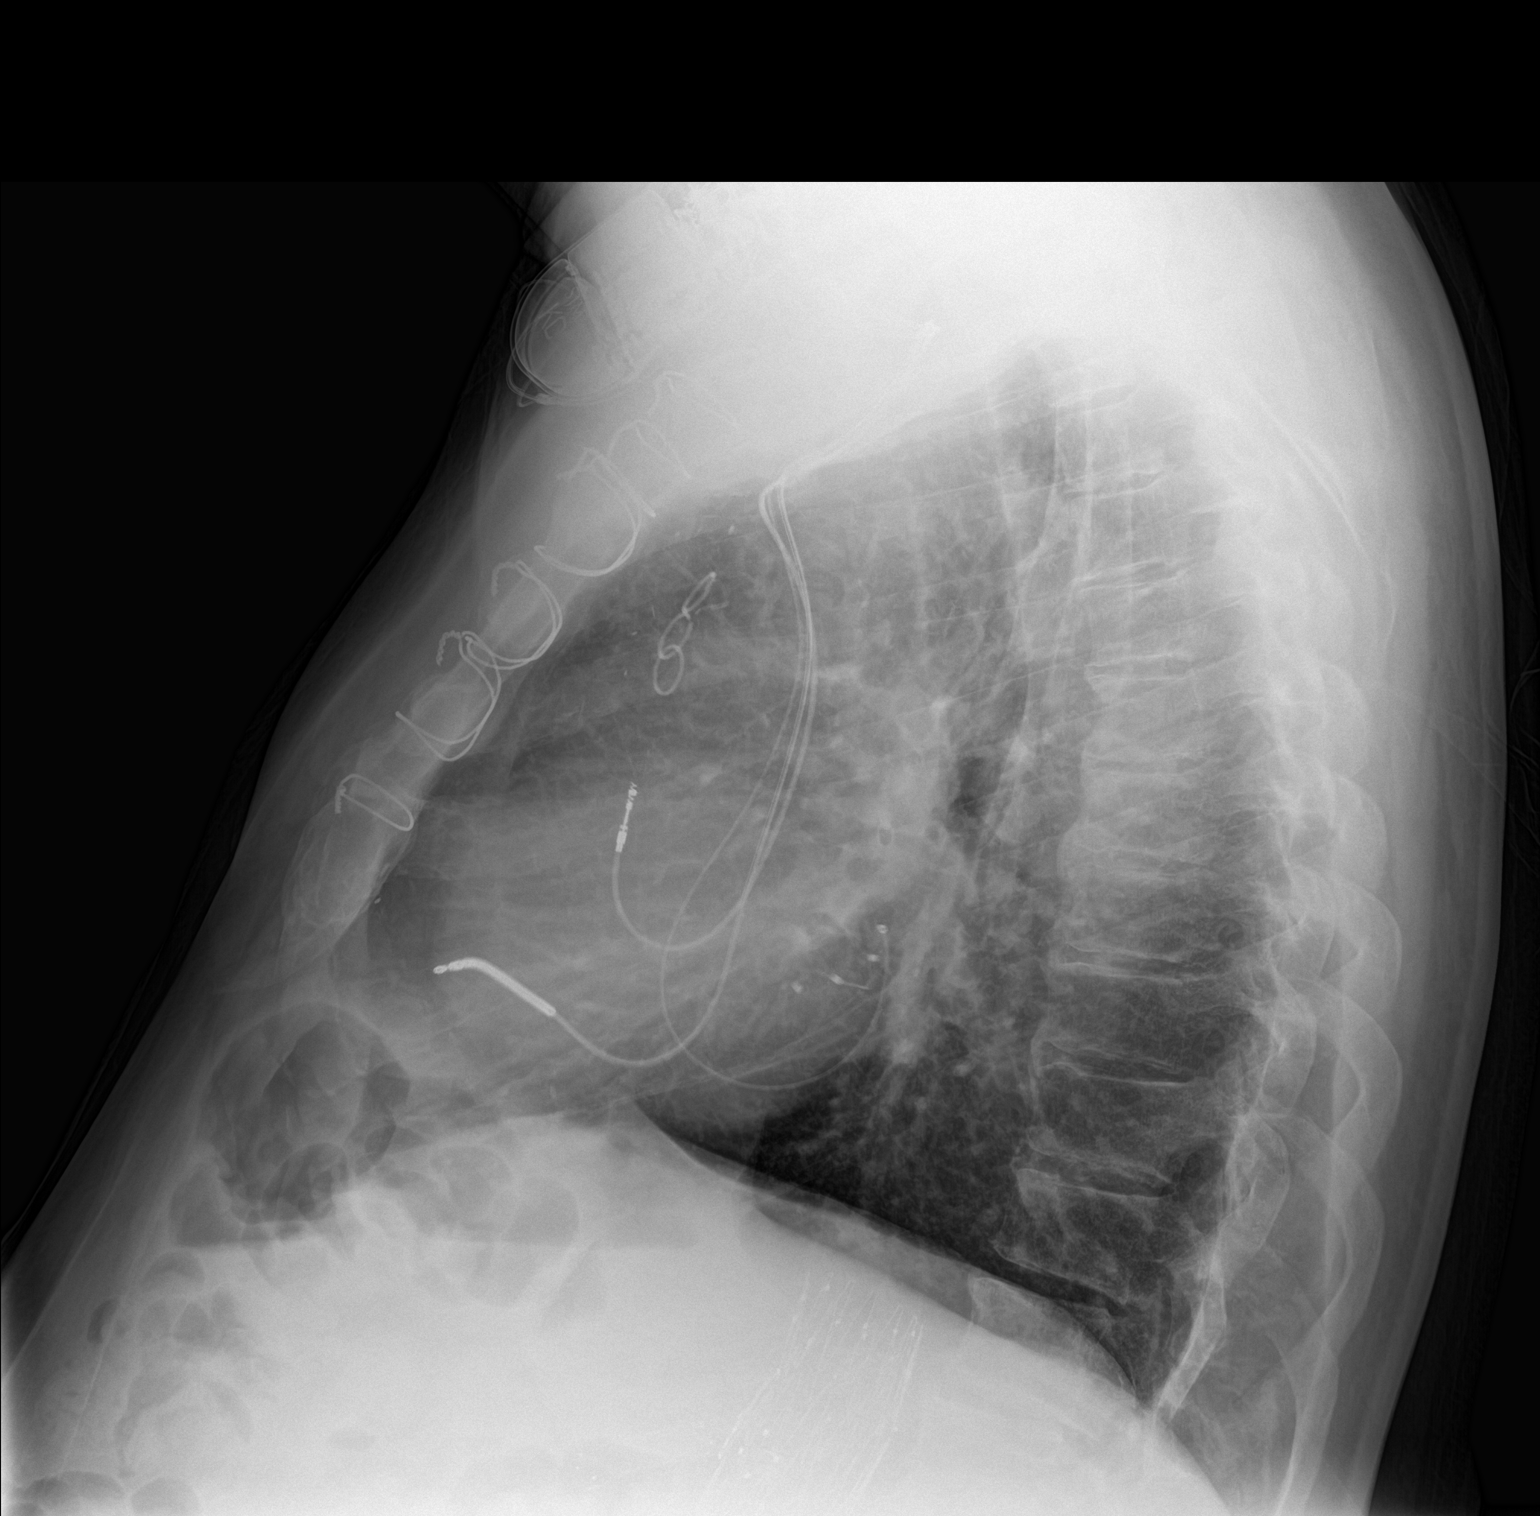

[2 of 2 positions shown; findings below may reference images not displayed]

FINDINGS: Prior median sternotomy CABG. AICD noted in stable position. Stable
cardiomegaly. No evidence of overt congestive heart failure. No
pleural effusion or pneumothorax. Degenerative changes thoracic
spine
IMPRESSION: 1. Prior CABG. AICD noted in stable position. Stable cardiomegaly.
No evidence congestive heart failure.
2. No acute pulmonary disease.  Chest is stable from prior exam .

## 2016-08-02 ENCOUNTER — Other Ambulatory Visit: Payer: Self-pay | Admitting: Cardiology

## 2016-08-08 DIAGNOSIS — E119 Type 2 diabetes mellitus without complications: Secondary | ICD-10-CM | POA: Diagnosis not present

## 2016-08-08 DIAGNOSIS — E669 Obesity, unspecified: Secondary | ICD-10-CM | POA: Diagnosis not present

## 2016-08-08 DIAGNOSIS — Z6834 Body mass index (BMI) 34.0-34.9, adult: Secondary | ICD-10-CM | POA: Diagnosis not present

## 2016-08-24 ENCOUNTER — Ambulatory Visit (INDEPENDENT_AMBULATORY_CARE_PROVIDER_SITE_OTHER): Payer: Medicare Other | Admitting: *Deleted

## 2016-08-24 DIAGNOSIS — I255 Ischemic cardiomyopathy: Secondary | ICD-10-CM | POA: Diagnosis not present

## 2016-08-24 DIAGNOSIS — E669 Obesity, unspecified: Secondary | ICD-10-CM | POA: Diagnosis not present

## 2016-08-24 DIAGNOSIS — I10 Essential (primary) hypertension: Secondary | ICD-10-CM | POA: Diagnosis not present

## 2016-08-24 DIAGNOSIS — G4733 Obstructive sleep apnea (adult) (pediatric): Secondary | ICD-10-CM | POA: Diagnosis not present

## 2016-08-24 DIAGNOSIS — E119 Type 2 diabetes mellitus without complications: Secondary | ICD-10-CM | POA: Diagnosis not present

## 2016-08-24 LAB — CUP PACEART REMOTE DEVICE CHECK
Battery Remaining Percentage: 71 %
Brady Statistic AP VS Percent: 1 %
Brady Statistic AS VP Percent: 71 %
Brady Statistic AS VS Percent: 1.7 %
Date Time Interrogation Session: 20180405060025
HIGH POWER IMPEDANCE MEASURED VALUE: 68 Ohm
HIGH POWER IMPEDANCE MEASURED VALUE: 68 Ohm
Implantable Lead Implant Date: 20160616
Implantable Lead Implant Date: 20161005
Implantable Lead Location: 753858
Implantable Lead Location: 753860
Implantable Pulse Generator Implant Date: 20160616
Lead Channel Impedance Value: 400 Ohm
Lead Channel Impedance Value: 760 Ohm
Lead Channel Pacing Threshold Amplitude: 0.75 V
Lead Channel Pacing Threshold Amplitude: 1.875 V
Lead Channel Pacing Threshold Pulse Width: 0.5 ms
Lead Channel Pacing Threshold Pulse Width: 0.5 ms
Lead Channel Pacing Threshold Pulse Width: 1 ms
Lead Channel Sensing Intrinsic Amplitude: 8.1 mV
Lead Channel Setting Pacing Amplitude: 2.875
Lead Channel Setting Sensing Sensitivity: 0.5 mV
MDC IDC LEAD IMPLANT DT: 20161005
MDC IDC LEAD LOCATION: 753859
MDC IDC MSMT BATTERY REMAINING LONGEVITY: 48 mo
MDC IDC MSMT BATTERY VOLTAGE: 2.95 V
MDC IDC MSMT LEADCHNL RA IMPEDANCE VALUE: 540 Ohm
MDC IDC MSMT LEADCHNL RA PACING THRESHOLD AMPLITUDE: 0.75 V
MDC IDC MSMT LEADCHNL RA SENSING INTR AMPL: 1.4 mV
MDC IDC SET LEADCHNL LV PACING PULSEWIDTH: 1 ms
MDC IDC SET LEADCHNL RA PACING AMPLITUDE: 1.75 V
MDC IDC SET LEADCHNL RV PACING AMPLITUDE: 2 V
MDC IDC SET LEADCHNL RV PACING PULSEWIDTH: 0.5 ms
MDC IDC STAT BRADY AP VP PERCENT: 24 %
MDC IDC STAT BRADY RA PERCENT PACED: 21 %
Pulse Gen Serial Number: 7288352

## 2016-08-24 NOTE — Progress Notes (Signed)
Remote ICD transmission.   

## 2016-08-25 ENCOUNTER — Encounter: Payer: Self-pay | Admitting: Cardiology

## 2016-08-28 ENCOUNTER — Ambulatory Visit (INDEPENDENT_AMBULATORY_CARE_PROVIDER_SITE_OTHER): Payer: Medicare Other

## 2016-08-28 ENCOUNTER — Ambulatory Visit (INDEPENDENT_AMBULATORY_CARE_PROVIDER_SITE_OTHER): Payer: Medicare Other | Admitting: Orthopedic Surgery

## 2016-08-28 ENCOUNTER — Encounter: Payer: Self-pay | Admitting: Orthopedic Surgery

## 2016-08-28 VITALS — BP 143/74 | HR 62 | Ht 72.0 in | Wt 253.0 lb

## 2016-08-28 DIAGNOSIS — M5441 Lumbago with sciatica, right side: Secondary | ICD-10-CM | POA: Diagnosis not present

## 2016-08-28 DIAGNOSIS — I255 Ischemic cardiomyopathy: Secondary | ICD-10-CM

## 2016-08-28 DIAGNOSIS — M5442 Lumbago with sciatica, left side: Secondary | ICD-10-CM | POA: Diagnosis not present

## 2016-08-28 DIAGNOSIS — M48062 Spinal stenosis, lumbar region with neurogenic claudication: Secondary | ICD-10-CM | POA: Diagnosis not present

## 2016-08-28 NOTE — Progress Notes (Signed)
Patient ID: [name], male   DOB: Oct 03, 1945, 71 y.o.   MRN: [id number]  Chief Complaint  Patient presents with  . Back Pain    LOW BACK PAIN, BILATERAL SIDES WITH BILATERAL RADICULOPATHY    HPI [name] is a 71 y.o. male.  Two-year history of weakness back pain bilateral leg pain with a history of AAA right lower extremity claudication with revascularization currently still symptomatic. He also has a classic history of of spinal stenosis with a positive groceries cart sign history difficulty walking relieved by bending forward or stopping. Symptoms for 2-3 years  No treatment  Patient notices weakness and giving way of his legs  Review of Systems Review of Systems (2 MINIMUM)  No fever at this time shortness of breath or chest pain  Past Medical History:  Diagnosis Date  . AAA (abdominal aortic aneurysm) ([place])    Followed by Dr. Donnetta Hutching  . AICD (automatic cardioverter/defibrillator) present   . Angioedema    Secondary to ACE inhibitor  . Arthritis   . Bell palsy   . Carotid artery disease ([place])   . Coronary atherosclerosis of native coronary artery    Multivessel status post CABG  . Essential hypertension, benign   . Gout   . Hernia of abdominal wall   . Hypercholesteremia   . Ischemic cardiomyopathy    LVEF 45-50% 11/2011  . Migraines   . Myocardial infarction   . Nephrolithiasis   . PAD (peripheral artery disease) ([place])    Followed by Dr. Donnetta Hutching  . Sleep apnea   . Type 2 diabetes mellitus ([place])     Past Surgical History:  Procedure Laterality Date  . ABDOMINAL AORTIC ANEURYSM REPAIR  November 19, 2013   [facility] :  Dr. Sammuel Hines  . ABDOMINAL AORTIC ANEURYSM REPAIR  09-07-2014   [facility]  . BI-VENTRICULAR IMPLANTABLE CARDIOVERTER DEFIBRILLATOR  (CRT-D)  11/05/2014  . BYPASS GRAFT POPLITEAL TO POPLITEAL Left 06/08/2014   Procedure: BYPASS GRAFT FEMORAL ARTERY TO ABOVE KNEE POPLITEAL;  Surgeon: Rosetta Posner, MD;  Location: [place];  Service:  Vascular;  Laterality: Left;  . CARDIAC CATHETERIZATION N/A 10/02/2014   Procedure: Left Heart Cath and Cors/Grafts Angiography;  Surgeon: Belva Crome, MD;  Location: [place] CV [facility];  Service: Cardiovascular;  Laterality: N/A;  . CARDIAC CATHETERIZATION  "several"  . CATARACT EXTRACTION W/PHACO  03/23/2011   Procedure: CATARACT EXTRACTION PHACO AND INTRAOCULAR LENS PLACEMENT (IOC);  Surgeon: Tonny Branch;  Location: [facility];  Service: Ophthalmology;  Laterality: Left;  CDE: 15.99  . CATARACT EXTRACTION W/PHACO  04/17/2011   Procedure: CATARACT EXTRACTION PHACO AND INTRAOCULAR LENS PLACEMENT (IOC);  Surgeon: Tonny Branch;  Location: [facility];  Service: Ophthalmology;  Laterality: Right;  CDE: 23.45  . CORONARY ARTERY BYPASS GRAFT  2001   LIMA to LAD, SVG to diagonal and ramus, SVG to OM, SVG to AM  . DUPUYTREN CONTRACTURE RELEASE Left 2009  . EP IMPLANTABLE DEVICE N/A 11/05/2014   Procedure: BiV ICD Insertion CRT-D;  Surgeon: Evans Lance, MD;  Location: [place] [facility];  Service: Cardiovascular;  Laterality: N/A;  . EP IMPLANTABLE DEVICE N/A 02/24/2015   Procedure: Lead Revision/Repair;  Surgeon: Evans Lance, MD;  Location: [place] [facility];  Service: Cardiovascular;  Laterality: N/A;  . EXTRACORPOREAL SHOCK WAVE LITHOTRIPSY  X 1  . EYE SURGERY    . FOOT SURGERY Left 1963   "gangrene"  . ICD LEAD REMOVAL  02/24/2015  .  LOWER EXTREMITY ANGIOGRAM Bilateral 04/28/2015   Procedure: Lower Extremity Angiogram;  Surgeon: Rosetta Posner, MD;  Location: [place] [facility];  Service: Cardiovascular;  Laterality: Bilateral;  . PERIPHERAL VASCULAR CATHETERIZATION N/A 04/28/2015   Procedure: Abdominal Aortogram;  Surgeon: Rosetta Posner, MD;  Location: [place] [facility];  Service: Cardiovascular;  Laterality: N/A;    Social History Social History  Substance Use Topics  . Smoking status: Former Smoker    Packs/day: 1.00    Years: 25.00    Types: Cigarettes    Start date: 02/28/1979    Quit date:  05/23/2007  . Smokeless tobacco: Never Used  . Alcohol use No    Allergies  Allergen Reactions  . Ace Inhibitors Anaphylaxis  . Codeine Other (See Comments)     delirium  . Lisinopril Anaphylaxis and Swelling  . Hydromorphone Nausea And Vomiting    Current Meds  Medication Sig  . acetaminophen (TYLENOL) 500 MG tablet Take 1,000 mg by mouth once as needed for mild pain, moderate pain or headache (pain).   Marland Kitchen allopurinol (ZYLOPRIM) 100 MG tablet Take 100 mg by mouth daily with supper.   Marland Kitchen aspirin EC 81 MG tablet Take 81 mg by mouth daily.  . carvedilol (COREG) 25 MG tablet TAKE (1) TABLET TWICE DAILY.  . furosemide (LASIX) 80 MG tablet TAKE (1) TABLET TWICE DAILY.  Marland Kitchen gabapentin (NEURONTIN) 300 MG capsule Take 300 mg by mouth 2 (two) times daily.  Marland Kitchen glipiZIDE (GLUCOTROL XL) 10 MG 24 hr tablet Take 10 mg by mouth 2 (two) times daily.   . isosorbide mononitrate (IMDUR) 60 MG 24 hr tablet Take 30-120 mg by mouth 2 (two) times daily. 120 mg am, 30 mg pm  . linagliptin (TRADJENTA) 5 MG TABS tablet Take 5 mg by mouth daily.    . nitroGLYCERIN (NITROSTAT) 0.4 MG SL tablet PLACE 0.4 MG  UNDER THE TONGUE EVERY 5 MINUTES UP TO 3 DOSES AS NEEDED FOR CHEST PAIN.  Marland Kitchen potassium chloride (K-DUR) 10 MEQ tablet TAKE 2 TABLETS BY MOUTH ONCE DAILY.  . simvastatin (ZOCOR) 20 MG tablet Take 1 tablet (20 mg total) by mouth at bedtime.      Physical Exam Physical Exam 1.BP (!) 143/74   Pulse 62   Ht 6' (1.829 m)   Wt 253 lb (114.8 kg)   BMI 34.31 kg/m   2. Gen. appearance. The patient is well-developed and well-nourished, grooming and hygiene are normal. There are no gross congenital abnormalities  3. The patient is alert and oriented to person place and time  4. Mood and affect are normal  5. Ambulation Difficult but unsupported Examination reveals the following: 6. On inspection we find minimal tenderness in his lower back no pain tenderness in the groin) left hip 7. With the range of motion of   right and left hip action 110 without pain 8. Stability tests were normal  in each hip  9. Strength tests revealed grade 5 motor strength in both legs  10. Skin multiple scars are noted in the prior vein grafts swelling and edema of the left leg greater than right on the right eye  Reflexes 0+ at both ankles at the ankles bilaterally.  Straight leg raise is negative tight hamstring 11. Sensation remains intact in each leg    MEDICAL DECISION MAKING:    Data Reviewed X-ray shows lumbar spondylosis  Assessment Diagnosis spinal stenosis with neurogenic claudication  Plan CT scan (pacemaker present) lumbar spine imaged spinal stenosis.  This patient is a very high risk patient for surgical intervention but he needs to see a spine specialist is following as he is beyond the scope of our practice  Arther Abbott 08/28/2016, 1:59 PM

## 2016-08-28 NOTE — Patient Instructions (Signed)
CT SCAN @ [facility] 09/01/16 ARRIVE 6:45 PM TO REGISTER IF YOU CANNOT KEEP THIS Mower [phone] TO RESCHEDULE

## 2016-09-01 ENCOUNTER — Ambulatory Visit (HOSPITAL_COMMUNITY)
Admission: RE | Admit: 2016-09-01 | Discharge: 2016-09-01 | Disposition: A | Discharge status: Home / Self Care | Payer: Medicare Other | Source: Ambulatory Visit | Attending: Orthopedic Surgery | Admitting: Orthopedic Surgery

## 2016-09-01 ENCOUNTER — Other Ambulatory Visit: Payer: Self-pay | Admitting: Cardiology

## 2016-09-01 DIAGNOSIS — M48061 Spinal stenosis, lumbar region without neurogenic claudication: Secondary | ICD-10-CM | POA: Insufficient documentation

## 2016-09-01 DIAGNOSIS — M48062 Spinal stenosis, lumbar region with neurogenic claudication: Secondary | ICD-10-CM | POA: Insufficient documentation

## 2016-09-01 DIAGNOSIS — M8938 Hypertrophy of bone, other site: Secondary | ICD-10-CM | POA: Diagnosis not present

## 2016-09-01 DIAGNOSIS — M5127 Other intervertebral disc displacement, lumbosacral region: Secondary | ICD-10-CM | POA: Insufficient documentation

## 2016-09-01 DIAGNOSIS — M5126 Other intervertebral disc displacement, lumbar region: Secondary | ICD-10-CM | POA: Diagnosis not present

## 2016-09-01 DIAGNOSIS — M1288 Other specific arthropathies, not elsewhere classified, other specified site: Secondary | ICD-10-CM | POA: Diagnosis not present

## 2016-09-05 DIAGNOSIS — D649 Anemia, unspecified: Secondary | ICD-10-CM | POA: Diagnosis not present

## 2016-09-05 DIAGNOSIS — E559 Vitamin D deficiency, unspecified: Secondary | ICD-10-CM | POA: Diagnosis not present

## 2016-09-05 DIAGNOSIS — E785 Hyperlipidemia, unspecified: Secondary | ICD-10-CM | POA: Diagnosis not present

## 2016-09-05 DIAGNOSIS — Z1321 Encounter for screening for nutritional disorder: Secondary | ICD-10-CM | POA: Diagnosis not present

## 2016-09-05 DIAGNOSIS — I129 Hypertensive chronic kidney disease with stage 1 through stage 4 chronic kidney disease, or unspecified chronic kidney disease: Secondary | ICD-10-CM | POA: Diagnosis not present

## 2016-09-05 DIAGNOSIS — I10 Essential (primary) hypertension: Secondary | ICD-10-CM | POA: Diagnosis not present

## 2016-09-05 DIAGNOSIS — E119 Type 2 diabetes mellitus without complications: Secondary | ICD-10-CM | POA: Diagnosis not present

## 2016-09-08 ENCOUNTER — Encounter: Payer: Self-pay | Admitting: Cardiology

## 2016-09-08 ENCOUNTER — Ambulatory Visit (INDEPENDENT_AMBULATORY_CARE_PROVIDER_SITE_OTHER): Payer: Medicare Other | Admitting: Orthopedic Surgery

## 2016-09-08 DIAGNOSIS — I255 Ischemic cardiomyopathy: Secondary | ICD-10-CM

## 2016-09-08 DIAGNOSIS — M48061 Spinal stenosis, lumbar region without neurogenic claudication: Secondary | ICD-10-CM

## 2016-09-08 NOTE — Progress Notes (Signed)
Follow-up visit MRI report is available now for review. The chief complaint was lower back and leg pain  The patient has multilevel generalized spinal stenosis and this is borne out on his CT scan.  The report is below it's been reviewed  I reviewed the results with him and referred him to appropriate neurosurgery   CT REPORT  IMPRESSION: Multilevel arthropathy. Vacuum phenomenon at L4-5. There is impression on the exiting nerve root on the right far laterally at L1-2 asymmetric disc protrusion and bony hypertrophy in this area.   There is mild generalized spinal stenosis at L2-3 with slightly greater stenosis at L3-4 and L4-5, multifactorial as summarized above.   Impression on each exiting nerve root and exit foramen due to disc protrusion at L5-S1.   No frank disc extrusion seen on this study.   No fracture or spondylolisthesis.   Patient with aorto bi-iliac stent graft in place.     Electronically Signed   By: Lowella Grip III M.D.   On: 09/01/2016 10:16

## 2016-09-15 ENCOUNTER — Other Ambulatory Visit: Payer: Self-pay | Admitting: Cardiology

## 2016-09-25 DIAGNOSIS — I739 Peripheral vascular disease, unspecified: Secondary | ICD-10-CM | POA: Diagnosis not present

## 2016-09-25 DIAGNOSIS — M549 Dorsalgia, unspecified: Secondary | ICD-10-CM | POA: Diagnosis not present

## 2016-09-25 DIAGNOSIS — Z6833 Body mass index (BMI) 33.0-33.9, adult: Secondary | ICD-10-CM | POA: Diagnosis not present

## 2016-09-25 DIAGNOSIS — I10 Essential (primary) hypertension: Secondary | ICD-10-CM | POA: Diagnosis not present

## 2016-09-26 ENCOUNTER — Other Ambulatory Visit: Payer: Self-pay | Admitting: Neurosurgery

## 2016-09-26 DIAGNOSIS — I739 Peripheral vascular disease, unspecified: Secondary | ICD-10-CM

## 2016-09-29 DIAGNOSIS — E559 Vitamin D deficiency, unspecified: Secondary | ICD-10-CM | POA: Diagnosis not present

## 2016-09-29 DIAGNOSIS — N183 Chronic kidney disease, stage 3 (moderate): Secondary | ICD-10-CM | POA: Diagnosis not present

## 2016-09-29 DIAGNOSIS — D509 Iron deficiency anemia, unspecified: Secondary | ICD-10-CM | POA: Diagnosis not present

## 2016-09-29 DIAGNOSIS — I129 Hypertensive chronic kidney disease with stage 1 through stage 4 chronic kidney disease, or unspecified chronic kidney disease: Secondary | ICD-10-CM | POA: Diagnosis not present

## 2016-09-29 DIAGNOSIS — Z79899 Other long term (current) drug therapy: Secondary | ICD-10-CM | POA: Diagnosis not present

## 2016-09-29 DIAGNOSIS — R809 Proteinuria, unspecified: Secondary | ICD-10-CM | POA: Diagnosis not present

## 2016-10-03 ENCOUNTER — Other Ambulatory Visit: Payer: Self-pay | Admitting: Cardiology

## 2016-10-03 DIAGNOSIS — R809 Proteinuria, unspecified: Secondary | ICD-10-CM | POA: Diagnosis not present

## 2016-10-03 DIAGNOSIS — N183 Chronic kidney disease, stage 3 (moderate): Secondary | ICD-10-CM | POA: Diagnosis not present

## 2016-10-03 DIAGNOSIS — E1129 Type 2 diabetes mellitus with other diabetic kidney complication: Secondary | ICD-10-CM | POA: Diagnosis not present

## 2016-10-03 DIAGNOSIS — I10 Essential (primary) hypertension: Secondary | ICD-10-CM | POA: Diagnosis not present

## 2016-10-05 ENCOUNTER — Ambulatory Visit
Admission: RE | Admit: 2016-10-05 | Discharge: 2016-10-05 | Disposition: A | Discharge status: Home / Self Care | Payer: Medicare Other | Source: Ambulatory Visit | Attending: Neurosurgery | Admitting: Neurosurgery

## 2016-10-05 VITALS — BP 123/54 | HR 59

## 2016-10-05 DIAGNOSIS — M48061 Spinal stenosis, lumbar region without neurogenic claudication: Secondary | ICD-10-CM | POA: Diagnosis not present

## 2016-10-05 DIAGNOSIS — I739 Peripheral vascular disease, unspecified: Secondary | ICD-10-CM

## 2016-10-05 MED ORDER — IOPAMIDOL (ISOVUE-M 200) INJECTION 41%
15.0000 mL | Freq: Once | INTRAMUSCULAR | Status: AC
  Administered 2016-10-05: 15 mL via INTRATHECAL

## 2016-10-05 MED ORDER — DIAZEPAM 5 MG PO TABS
5.0000 mg | ORAL_TABLET | Freq: Once | ORAL | Status: AC
Start: 2016-10-05 — End: 2016-10-05
  Administered 2016-10-05: 5 mg via ORAL

## 2016-10-05 NOTE — Discharge Instructions (Signed)

## 2016-10-11 DIAGNOSIS — Z9841 Cataract extraction status, right eye: Secondary | ICD-10-CM | POA: Diagnosis not present

## 2016-10-11 DIAGNOSIS — H35033 Hypertensive retinopathy, bilateral: Secondary | ICD-10-CM | POA: Diagnosis not present

## 2016-10-11 DIAGNOSIS — Z961 Presence of intraocular lens: Secondary | ICD-10-CM | POA: Diagnosis not present

## 2016-10-11 DIAGNOSIS — H43811 Vitreous degeneration, right eye: Secondary | ICD-10-CM | POA: Diagnosis not present

## 2016-10-19 ENCOUNTER — Other Ambulatory Visit: Payer: Self-pay | Admitting: Cardiology

## 2016-10-23 DIAGNOSIS — R030 Elevated blood-pressure reading, without diagnosis of hypertension: Secondary | ICD-10-CM | POA: Diagnosis not present

## 2016-10-23 DIAGNOSIS — Z6832 Body mass index (BMI) 32.0-32.9, adult: Secondary | ICD-10-CM | POA: Diagnosis not present

## 2016-10-23 DIAGNOSIS — I739 Peripheral vascular disease, unspecified: Secondary | ICD-10-CM | POA: Diagnosis not present

## 2016-10-30 ENCOUNTER — Encounter: Payer: Self-pay | Admitting: Vascular Surgery

## 2016-11-01 DIAGNOSIS — Z8051 Family history of malignant neoplasm of kidney: Secondary | ICD-10-CM | POA: Diagnosis not present

## 2016-11-01 DIAGNOSIS — I25118 Atherosclerotic heart disease of native coronary artery with other forms of angina pectoris: Secondary | ICD-10-CM | POA: Diagnosis not present

## 2016-11-01 DIAGNOSIS — E1100 Type 2 diabetes mellitus with hyperosmolarity without nonketotic hyperglycemic-hyperosmolar coma (NKHHC): Secondary | ICD-10-CM | POA: Diagnosis not present

## 2016-11-01 DIAGNOSIS — M797 Fibromyalgia: Secondary | ICD-10-CM | POA: Diagnosis not present

## 2016-11-01 DIAGNOSIS — Z808 Family history of malignant neoplasm of other organs or systems: Secondary | ICD-10-CM | POA: Diagnosis not present

## 2016-11-01 DIAGNOSIS — F3130 Bipolar disorder, current episode depressed, mild or moderate severity, unspecified: Secondary | ICD-10-CM | POA: Diagnosis not present

## 2016-11-01 DIAGNOSIS — Z800 Family history of malignant neoplasm of digestive organs: Secondary | ICD-10-CM | POA: Diagnosis not present

## 2016-11-01 DIAGNOSIS — Z85038 Personal history of other malignant neoplasm of large intestine: Secondary | ICD-10-CM | POA: Diagnosis not present

## 2016-11-01 DIAGNOSIS — Z79899 Other long term (current) drug therapy: Secondary | ICD-10-CM | POA: Diagnosis not present

## 2016-11-01 DIAGNOSIS — F329 Major depressive disorder, single episode, unspecified: Secondary | ICD-10-CM | POA: Diagnosis not present

## 2016-11-01 DIAGNOSIS — I209 Angina pectoris, unspecified: Secondary | ICD-10-CM | POA: Diagnosis not present

## 2016-11-03 ENCOUNTER — Other Ambulatory Visit: Payer: Self-pay | Admitting: Cardiology

## 2016-11-07 ENCOUNTER — Encounter: Payer: Self-pay | Admitting: Vascular Surgery

## 2016-11-07 ENCOUNTER — Ambulatory Visit (HOSPITAL_COMMUNITY)
Admission: RE | Admit: 2016-11-07 | Discharge: 2016-11-07 | Disposition: A | Discharge status: Home / Self Care | Payer: Medicare Other | Source: Ambulatory Visit | Attending: Vascular Surgery | Admitting: Vascular Surgery

## 2016-11-07 ENCOUNTER — Ambulatory Visit (INDEPENDENT_AMBULATORY_CARE_PROVIDER_SITE_OTHER): Payer: Medicare Other | Admitting: Vascular Surgery

## 2016-11-07 ENCOUNTER — Ambulatory Visit (INDEPENDENT_AMBULATORY_CARE_PROVIDER_SITE_OTHER)
Admission: RE | Admit: 2016-11-07 | Discharge: 2016-11-07 | Disposition: A | Discharge status: Home / Self Care | Payer: Medicare Other | Source: Ambulatory Visit | Attending: Vascular Surgery | Admitting: Vascular Surgery

## 2016-11-07 VITALS — BP 133/62 | HR 60 | Temp 99.0°F | Resp 20 | Ht 72.0 in | Wt 259.0 lb

## 2016-11-07 DIAGNOSIS — I724 Aneurysm of artery of lower extremity: Secondary | ICD-10-CM | POA: Diagnosis not present

## 2016-11-07 DIAGNOSIS — I739 Peripheral vascular disease, unspecified: Secondary | ICD-10-CM

## 2016-11-07 DIAGNOSIS — I255 Ischemic cardiomyopathy: Secondary | ICD-10-CM

## 2016-11-07 LAB — VAS US LOWER EXTREMITY ARTERIAL DUPLEX
RIGHT ANT DIST TIBAL SYS PSV: 22 cm/s
RIGHT POST TIB DIST SYS: 17 cm/s
RSFPPSV: 45 cm/s
Right super femoral dist sys PSV: 0 cm/s
Right super femoral mid sys PSV: 0 cm/s

## 2016-11-07 NOTE — Progress Notes (Signed)
Vascular and Vein Specialist of [facility]  Patient name: [name] MRN: [id number] DOB: 19-Aug-1945 Sex: male  REASON FOR VISIT: Follow-up left femoral-popliteal bypass  HPI: [name] is a 71 y.o. male here today for follow-up. Status post left femoral to above-knee popliteal bypass with non-reversed saphenous vein due to superficial artery aneurysm January 2016. He has had imaging study which is shown some stenosis in the distal anastomosis and underwent arteriography showing stenosis as well. Was felt that his best option would be observation due to his prior aortic fenestrated stent graft repair. His main complaint continues to be of right calf claudication secondary to his known superficial femoral artery occlusive disease. He also has a pain in his hip and buttock with walking. His had orthopedic spine evaluation and his been told that this is not related to degenerative disc disease.  Past Medical History:  Diagnosis Date  . AAA (abdominal aortic aneurysm) ([place])    Followed by Dr. Donnetta Hutching  . AICD (automatic cardioverter/defibrillator) present   . Angioedema    Secondary to ACE inhibitor  . Arthritis   . Bell palsy   . Carotid artery disease ([place])   . Coronary atherosclerosis of native coronary artery    Multivessel status post CABG  . Essential hypertension, benign   . Gout   . Hernia of abdominal wall   . Hypercholesteremia   . Ischemic cardiomyopathy    LVEF 45-50% 11/2011  . Migraines   . Myocardial infarction ([place])   . Nephrolithiasis   . PAD (peripheral artery disease) ([place])    Followed by Dr. Donnetta Hutching  . Sleep apnea   . Type 2 diabetes mellitus (HCC)     Family History  Problem Relation Age of Onset  . Diabetes Mother        Type  I   . Varicose Veins Mother   . Heart disease Father 104       AAA  . AAA (abdominal aortic aneurysm) Father   . Hyperlipidemia Father   . Hypertension Father     SOCIAL  HISTORY: Social History  Substance Use Topics  . Smoking status: Former Smoker    Packs/day: 1.00    Years: 25.00    Types: Cigarettes    Start date: 02/28/1979    Quit date: 05/23/2007  . Smokeless tobacco: Never Used  . Alcohol use No    Allergies  Allergen Reactions  . Ace Inhibitors Swelling    Throat and lips swelled; ended up in ED  . Codeine Other (See Comments)     Delirium, hallucinations  . Hydromorphone Nausea And Vomiting    Current Outpatient Prescriptions  Medication Sig Dispense Refill  . acetaminophen (TYLENOL) 500 MG tablet Take 1,000 mg by mouth once as needed for mild pain, moderate pain or headache (pain).     Marland Kitchen allopurinol (ZYLOPRIM) 100 MG tablet Take 100 mg by mouth daily with supper.     Marland Kitchen amLODipine (NORVASC) 5 MG tablet TAKE 1 TABLET BY MOUTH ONCE DAILY. PATIENT NEEDS TO BE SEEN. 15 tablet 0  . aspirin EC 81 MG tablet Take 81 mg by mouth daily.    . carvedilol (COREG) 25 MG tablet TAKE (1) TABLET TWICE DAILY. 30 tablet 0  . furosemide (LASIX) 80 MG tablet TAKE (1) TABLET TWICE DAILY. 60 tablet 6  . gabapentin (NEURONTIN) 300 MG capsule Take 300 mg by mouth 2 (two) times daily.    Marland Kitchen glipiZIDE (GLUCOTROL XL) 10 MG 24 hr tablet  Take 10 mg by mouth 2 (two) times daily.     . isosorbide mononitrate (IMDUR) 60 MG 24 hr tablet Take 30-120 mg by mouth 2 (two) times daily. 120 mg am, 30 mg pm    . linagliptin (TRADJENTA) 5 MG TABS tablet Take 5 mg by mouth daily.      . nitroGLYCERIN (NITROSTAT) 0.4 MG SL tablet PLACE 0.4 MG  UNDER THE TONGUE EVERY 5 MINUTES UP TO 3 DOSES AS NEEDED FOR CHEST PAIN. 25 tablet 3  . potassium chloride (K-DUR) 10 MEQ tablet TAKE 2 TABLETS BY MOUTH ONCE DAILY. 60 tablet 6  . simvastatin (ZOCOR) 20 MG tablet Take 1 tablet (20 mg total) by mouth at bedtime. 30 tablet 6   No current facility-administered medications for this visit.     REVIEW OF SYSTEMS:  [X]  denotes positive finding, [ ]  denotes negative finding Cardiac  Comments:   Chest pain or chest pressure:    Shortness of breath upon exertion:    Short of breath when lying flat:    Irregular heart rhythm:        Vascular    Pain in calf, thigh, or hip brought on by ambulation: x   Pain in feet at night that wakes you up from your sleep:     Blood clot in your veins:    Leg swelling:           PHYSICAL EXAM: Vitals:   11/07/16 1619  BP: 133/62  Pulse: 60  Resp: 20  Temp: 99 F (37.2 C)  TempSrc: Oral  SpO2: 90%  Weight: 259 lb (117.5 kg)  Height: 6' (1.829 m)    GENERAL: The patient is a well-nourished male, in no acute distress. The vital signs are documented above. CARDIOVASCULAR: Apical left popliteal artery. Absent pedal pulses bilaterally. PULMONARY: There is good air exchange  MUSCULOSKELETAL: There are no major deformities or cyanosis. NEUROLOGIC: No focal weakness or paresthesias are detected. SKIN: There are no ulcers or rashes noted. PSYCHIATRIC: The patient has a normal affect.  DATA:  Noninvasive studies reveal ankle arm index of 0.44 on the right and 0.80 on the left. Graft scan reveals known stenosis at the distal anastomosis  MEDICAL ISSUES: Stable peripheral vascular disease. Had a fenestrated graft with Dr.Farber in [facility]. I do not have recent imaging. His most recent imaging in our system was from 2015 showing that he had patency of his hypogastric arteries bilaterally. He did undergo extension of his stent graft due to some type I distal endoleak. Was told that he did not have coverage of his pelvic vessels. I explained that even if he had hip and buttock claudication based on poor post pelvic flow that there would not be any treatment options for this. He was told that he can't have angioplasty of his distal anastomotic stenosis on the left femoropopliteal bypass with a brachial approach. I explained that this would be possible feel that his risk for this outweigh benefit since he has had patency of his graft  with no problem and no claudication symptoms. I have recommended continued observation. Explained that he could have graft thrombosis and explain symptoms related to this and he will notify us immediately should this occur. Otherwise we will see him again in one year with continued follow-up    Rosetta Posner, MD [facility] Vascular and Vein Specialists of [facility] Tel [phone] Pager [phone]

## 2016-11-14 NOTE — Addendum Note (Signed)
Addended by: Lianne Cure A on: 11/14/2016 04:04 PM   Modules accepted: Orders

## 2016-11-16 ENCOUNTER — Telehealth: Payer: Self-pay | Admitting: Cardiology

## 2016-11-16 NOTE — Telephone Encounter (Signed)
[name] needs a refill on his carvedilol (COREG) 25 MG  His appointment is set with Dr. Domenic Polite on 12/13/16

## 2016-11-21 NOTE — Progress Notes (Signed)
Cardiology Office Note  Date: 11/23/2016   ID: CHILD CAMPOY, DOB 09-20-1945, MRN [id number]  PCP: Doree Albee, MD  Primary Cardiologist: Rozann Lesches, MD   Chief Complaint  Patient presents with  . Coronary Artery Disease  . Cardiomyopathy    History of Present Illness: [name] is a medically complex 71 y.o. male last seen in December 2017. He presents for a follow-up visit. Reports no angina symptoms or nitroglycerin use. He is limited by significant claudication. Trying to cut back carbohydrates in his diet but still struggles to lose weight.  He continues to follow in the device clinic with Dr. Lovena Le, [place] biventricular ICD in place. No reported device shocks.  He continues to follow with Dr. Donnetta Hutching with history of PAD. Recent note from June reviewed.  I personally reviewed his ECG today which shows a ventricular paced rhythm. He has had much fewer PVCs on examination, continues on the medications outlined below.  Last echocardiogram was in February 2017.  Past Medical History:  Diagnosis Date  . AAA (abdominal aortic aneurysm) ([place])    Followed by Dr. Donnetta Hutching  . AICD (automatic cardioverter/defibrillator) present   . Angioedema    Secondary to ACE inhibitor  . Arthritis   . Bell palsy   . Carotid artery disease ([place])   . Coronary atherosclerosis of native coronary artery    Multivessel status post CABG  . Essential hypertension, benign   . Gout   . Hernia of abdominal wall   . Hypercholesteremia   . Ischemic cardiomyopathy    LVEF 45-50% 11/2011  . Migraines   . Myocardial infarction ([place])   . Nephrolithiasis   . PAD (peripheral artery disease) ([place])    Followed by Dr. Donnetta Hutching  . Sleep apnea   . Type 2 diabetes mellitus ([place])     Past Surgical History:  Procedure Laterality Date  . ABDOMINAL AORTIC ANEURYSM REPAIR  November 19, 2013   [facility] :  Dr. Sammuel Hines  . ABDOMINAL AORTIC ANEURYSM REPAIR  09-07-2014   [facility]  .  BI-VENTRICULAR IMPLANTABLE CARDIOVERTER DEFIBRILLATOR  (CRT-D)  11/05/2014  . BYPASS GRAFT POPLITEAL TO POPLITEAL Left 06/08/2014   Procedure: BYPASS GRAFT FEMORAL ARTERY TO ABOVE KNEE POPLITEAL;  Surgeon: Rosetta Posner, MD;  Location: [place];  Service: Vascular;  Laterality: Left;  . CARDIAC CATHETERIZATION N/A 10/02/2014   Procedure: Left Heart Cath and Cors/Grafts Angiography;  Surgeon: Belva Crome, MD;  Location: [place] [facility];  Service: Cardiovascular;  Laterality: N/A;  . CARDIAC CATHETERIZATION  "several"  . CATARACT EXTRACTION W/PHACO  03/23/2011   Procedure: CATARACT EXTRACTION PHACO AND INTRAOCULAR LENS PLACEMENT (IOC);  Surgeon: Tonny Branch;  Location: [facility];  Service: Ophthalmology;  Laterality: Left;  CDE: 15.99  . CATARACT EXTRACTION W/PHACO  04/17/2011   Procedure: CATARACT EXTRACTION PHACO AND INTRAOCULAR LENS PLACEMENT (IOC);  Surgeon: Tonny Branch;  Location: [facility];  Service: Ophthalmology;  Laterality: Right;  CDE: 23.45  . CORONARY ARTERY BYPASS GRAFT  2001   LIMA to LAD, SVG to diagonal and ramus, SVG to OM, SVG to AM  . DUPUYTREN CONTRACTURE RELEASE Left 2009  . EP IMPLANTABLE DEVICE N/A 11/05/2014   Procedure: BiV ICD Insertion CRT-D;  Surgeon: Evans Lance, MD;  Location: [place] [facility];  Service: Cardiovascular;  Laterality: N/A;  . EP IMPLANTABLE DEVICE N/A 02/24/2015   Procedure: Lead Revision/Repair;  Surgeon: Evans Lance, MD;  Location: [place] [facility];  Service: Cardiovascular;  Laterality: N/A;  . EXTRACORPOREAL SHOCK WAVE LITHOTRIPSY  X 1  . EYE SURGERY    . FOOT SURGERY Left 1963   "gangrene"  . ICD LEAD REMOVAL  02/24/2015  . LOWER EXTREMITY ANGIOGRAM Bilateral 04/28/2015   Procedure: Lower Extremity Angiogram;  Surgeon: Rosetta Posner, MD;  Location: [place] [facility];  Service: Cardiovascular;  Laterality: Bilateral;  . PERIPHERAL VASCULAR CATHETERIZATION N/A 04/28/2015   Procedure: Abdominal Aortogram;  Surgeon: Rosetta Posner, MD;  Location: [place] [facility];  Service: Cardiovascular;  Laterality: N/A;    Current Outpatient Prescriptions  Medication Sig Dispense Refill  . acetaminophen (TYLENOL) 500 MG tablet Take 1,000 mg by mouth once as needed for mild pain, moderate pain or headache (pain).     Marland Kitchen allopurinol (ZYLOPRIM) 100 MG tablet Take 100 mg by mouth daily with supper.     Marland Kitchen amLODipine (NORVASC) 5 MG tablet TAKE 1 TABLET BY MOUTH ONCE DAILY. PATIENT NEEDS TO BE SEEN. 15 tablet 0  . aspirin EC 81 MG tablet Take 81 mg by mouth daily.    . carvedilol (COREG) 25 MG tablet TAKE (1) TABLET TWICE DAILY. 60 tablet 3  . furosemide (LASIX) 80 MG tablet TAKE (1) TABLET TWICE DAILY. 60 tablet 6  . gabapentin (NEURONTIN) 300 MG capsule Take 300 mg by mouth 2 (two) times daily.    Marland Kitchen glipiZIDE (GLUCOTROL XL) 10 MG 24 hr tablet Take 10 mg by mouth 2 (two) times daily.     . isosorbide mononitrate (IMDUR) 60 MG 24 hr tablet Take 30-120 mg by mouth 2 (two) times daily. 120 mg am, 30 mg pm    . linagliptin (TRADJENTA) 5 MG TABS tablet Take 5 mg by mouth daily.      . nitroGLYCERIN (NITROSTAT) 0.4 MG SL tablet PLACE 0.4 MG  UNDER THE TONGUE EVERY 5 MINUTES UP TO 3 DOSES AS NEEDED FOR CHEST PAIN. 25 tablet 3  . potassium chloride (K-DUR) 10 MEQ tablet TAKE 2 TABLETS BY MOUTH ONCE DAILY. 60 tablet 6  . simvastatin (ZOCOR) 20 MG tablet Take 1 tablet (20 mg total) by mouth at bedtime. 30 tablet 6   No current facility-administered medications for this visit.    Allergies:  Ace inhibitors; Codeine; and Hydromorphone   Social History: The patient  reports that he quit smoking about 9 years ago. His smoking use included Cigarettes. He started smoking about 37 years ago. He has a 25.00 pack-year smoking history. He has never used smokeless tobacco. He reports that he does not drink alcohol or use drugs.   ROS:  Please see the history of present illness. Otherwise, complete review of systems is positive for limiting claudication.  All other systems  are reviewed and negative.   Physical Exam: VS:  BP 138/62   Pulse 67   Ht 6\' 1"  (1.854 m)   Wt 251 lb 3.2 oz (113.9 kg)   SpO2 97%   BMI 33.14 kg/m , BMI Body mass index is 33.14 kg/m.  Wt Readings from Last 3 Encounters:  11/23/16 251 lb 3.2 oz (113.9 kg)  11/07/16 259 lb (117.5 kg)  08/28/16 253 lb (114.8 kg)    General: Obese male, appears comfortable at rest. HEENT: Conjunctiva and lids normal, oropharynx clear. Neck: Supple, elevated JVP, no carotid bruits, no thyromegaly. Thorax: Well-healed device pocket site on left. Lungs: Decreased breath sounds, nonlabored breathing at rest. Cardiac: Indistinct PMI, regular rate and rhythm, no S3, no pericardial rub. Abdomen:  Protuberant, nontender, bowel sounds present. Extremities: 1+ edema, distal pulses diminished. Skin: Warm and dry. Musculoskeletal: No kyphosis. Neuropsychiatric: Alert and oriented 3, affect appropriate.  ECG: I personally reviewed the tracing from 01/20/2016 which showed a ventricular paced rhythm with atrial sensing.  Recent Labwork:    Component Value Date/Time   CHOL 118 10/16/2014 0715   TRIG 100 10/16/2014 0715   HDL 33 (L) 10/16/2014 0715   CHOLHDL 3.6 10/16/2014 0715   VLDL 20 10/16/2014 0715   LDLCALC 65 10/16/2014 0715  May 2017: Potassium 3.8, BUN 29, creatinine 1.9  Other Studies Reviewed Today:  Cardiac catheterization 10/02/2014:  Bypass graft failure with total occlusion of the saphenous vein graft to the acute marginal of the right coronary, total occlusion of the sequential saphenous vein graft to the diagonal and ramus intermedius, and total occlusion of the saphenous graft to the distal circumflex/obtuse marginal.  Patent LIMA to the LAD.  Total occlusion of the native LAD, the mid right coronary, second obtuse marginal, the first diagonal, and the ramus intermedius.  The distal right coronary, the obtuse marginal, ramus intermedius are collateralized from the native circulation  via the LIMA to LAD.  Severe left ventricular systolic dysfunction with an inferobasal aneurysm, and an ejection fraction of 20-25%.  Echocardiogram 06/28/2015: Study Conclusions  - Procedure narrative: Transthoracic echocardiography. Image quality was fair. The study was technically difficult, as a result of poor sound wave transmission. Intravenous contrast (Definity) was administered. - Left ventricle: The cavity size was normal. Systolic function was moderately reduced. The estimated ejection fraction was in the range of 35% to 40%. Diffusely hypokinetic. Doppler parameters are consistent with abnormal left ventricular relaxation (grade 1 diastolic dysfunction). Doppler parameters are consistent with high ventricular filling pressure. Mild to moderate concentric left ventricular hypertrophy. - Regional wall motion abnormality: Severe hypokinesis of the mid inferior myocardium; mild hypokinesis of the mid inferoseptal myocardium. - Ventricular septum: Septal motion showed abnormal function and dyssynergy. These changes are consistent with right ventricular pacing. - Aortic valve: Poorly visualized. Mildly thickened, mildly calcified leaflets. There may be mild aortic valve stenosis. - Mitral valve: Calcified annulus. - Left atrium: The atrium was mildly dilated. - Right ventricle: Pacer wire or catheter noted in right ventricle.  Assessment and Plan:  1. Ischemic cardiomyopathy, last LVEF 35-40%. Plan is to continue medical therapy, we will follow-up with an echocardiogram in comparison to last year. He is not on ACE inhibitor or ARB due to allergy and progressive renal insufficiency.  2. CAD status post CABG with known graft disease. Fortunately, he is clinically stable without progressive angina on current medical regimen.  3. Essential hypertension, systolic blood pressure in the 130s. No changes made today.  4. Hyperlipidemia on  Zocor.  Current medicines were reviewed with the patient today.   Orders Placed This Encounter  Procedures  . EKG 12-Lead  . ECHOCARDIOGRAM COMPLETE    Disposition: Follow-up in 6 months.  Signed, Satira Sark, MD, [facility] 11/23/2016 2:20 PM    [facility], [place], [place] [address] Phone: [phone]; Fax: [phone]

## 2016-11-23 ENCOUNTER — Encounter: Payer: Self-pay | Admitting: Cardiology

## 2016-11-23 ENCOUNTER — Ambulatory Visit (INDEPENDENT_AMBULATORY_CARE_PROVIDER_SITE_OTHER): Payer: Medicare Other | Admitting: *Deleted

## 2016-11-23 ENCOUNTER — Ambulatory Visit (INDEPENDENT_AMBULATORY_CARE_PROVIDER_SITE_OTHER): Payer: Medicare Other | Admitting: Cardiology

## 2016-11-23 VITALS — BP 138/62 | HR 67 | Ht 73.0 in | Wt 251.2 lb

## 2016-11-23 DIAGNOSIS — I255 Ischemic cardiomyopathy: Secondary | ICD-10-CM | POA: Diagnosis not present

## 2016-11-23 DIAGNOSIS — I10 Essential (primary) hypertension: Secondary | ICD-10-CM | POA: Diagnosis not present

## 2016-11-23 DIAGNOSIS — E782 Mixed hyperlipidemia: Secondary | ICD-10-CM | POA: Diagnosis not present

## 2016-11-23 DIAGNOSIS — I25119 Atherosclerotic heart disease of native coronary artery with unspecified angina pectoris: Secondary | ICD-10-CM

## 2016-11-23 MED ORDER — CARVEDILOL 25 MG PO TABS
ORAL_TABLET | ORAL | 3 refills | Status: DC
Start: 2016-11-23 — End: 2017-03-26

## 2016-11-23 NOTE — Patient Instructions (Signed)

## 2016-11-24 NOTE — Progress Notes (Signed)
Remote ICD transmission.   

## 2016-12-01 ENCOUNTER — Encounter: Payer: Self-pay | Admitting: Cardiology

## 2016-12-06 ENCOUNTER — Ambulatory Visit (INDEPENDENT_AMBULATORY_CARE_PROVIDER_SITE_OTHER): Payer: Medicare Other

## 2016-12-06 ENCOUNTER — Other Ambulatory Visit: Payer: Self-pay

## 2016-12-06 DIAGNOSIS — I255 Ischemic cardiomyopathy: Secondary | ICD-10-CM | POA: Diagnosis not present

## 2016-12-07 ENCOUNTER — Telehealth: Payer: Self-pay

## 2016-12-07 DIAGNOSIS — E785 Hyperlipidemia, unspecified: Secondary | ICD-10-CM | POA: Diagnosis not present

## 2016-12-07 DIAGNOSIS — I10 Essential (primary) hypertension: Secondary | ICD-10-CM | POA: Diagnosis not present

## 2016-12-07 DIAGNOSIS — E119 Type 2 diabetes mellitus without complications: Secondary | ICD-10-CM | POA: Diagnosis not present

## 2016-12-07 DIAGNOSIS — E559 Vitamin D deficiency, unspecified: Secondary | ICD-10-CM | POA: Diagnosis not present

## 2016-12-07 NOTE — Telephone Encounter (Signed)
-----   Message from Acquanetta Chain, LPN sent at 6/96/7893  7:53 AM EDT -----   ----- Message ----- From: Satira Sark, MD Sent: 12/06/2016   4:48 PM To: Doree Albee, MD, Merlene Laughter, LPN  Results reviewed. Study indicates overall improvement in LVEF to range of 50-55% compared with the prior study. Continue medical therapy. A copy of this test should be forwarded to Doree Albee, MD.

## 2016-12-07 NOTE — Telephone Encounter (Signed)
Patient notified. Routed to PCP 

## 2016-12-13 ENCOUNTER — Ambulatory Visit: Payer: Medicare Other | Admitting: Cardiology

## 2016-12-15 ENCOUNTER — Encounter: Payer: Self-pay | Admitting: Cardiology

## 2016-12-15 ENCOUNTER — Emergency Department (HOSPITAL_COMMUNITY): Payer: Medicare Other

## 2016-12-15 ENCOUNTER — Emergency Department (HOSPITAL_COMMUNITY)
Admission: EM | Admit: 2016-12-15 | Discharge: 2016-12-15 | Disposition: A | Discharge status: Home / Self Care | Payer: Medicare Other | Attending: Emergency Medicine | Admitting: Emergency Medicine

## 2016-12-15 ENCOUNTER — Encounter (HOSPITAL_COMMUNITY): Payer: Self-pay | Admitting: *Deleted

## 2016-12-15 DIAGNOSIS — Z7984 Long term (current) use of oral hypoglycemic drugs: Secondary | ICD-10-CM | POA: Insufficient documentation

## 2016-12-15 DIAGNOSIS — I110 Hypertensive heart disease with heart failure: Secondary | ICD-10-CM | POA: Diagnosis not present

## 2016-12-15 DIAGNOSIS — G510 Bell's palsy: Secondary | ICD-10-CM | POA: Insufficient documentation

## 2016-12-15 DIAGNOSIS — I5022 Chronic systolic (congestive) heart failure: Secondary | ICD-10-CM | POA: Insufficient documentation

## 2016-12-15 DIAGNOSIS — Z7982 Long term (current) use of aspirin: Secondary | ICD-10-CM | POA: Diagnosis not present

## 2016-12-15 DIAGNOSIS — Z87891 Personal history of nicotine dependence: Secondary | ICD-10-CM | POA: Diagnosis not present

## 2016-12-15 DIAGNOSIS — Z79899 Other long term (current) drug therapy: Secondary | ICD-10-CM | POA: Insufficient documentation

## 2016-12-15 DIAGNOSIS — H9391 Unspecified disorder of right ear: Secondary | ICD-10-CM | POA: Diagnosis present

## 2016-12-15 DIAGNOSIS — E1140 Type 2 diabetes mellitus with diabetic neuropathy, unspecified: Secondary | ICD-10-CM | POA: Diagnosis not present

## 2016-12-15 DIAGNOSIS — I2510 Atherosclerotic heart disease of native coronary artery without angina pectoris: Secondary | ICD-10-CM | POA: Insufficient documentation

## 2016-12-15 DIAGNOSIS — Z951 Presence of aortocoronary bypass graft: Secondary | ICD-10-CM | POA: Diagnosis not present

## 2016-12-15 DIAGNOSIS — H9191 Unspecified hearing loss, right ear: Secondary | ICD-10-CM | POA: Insufficient documentation

## 2016-12-15 LAB — BASIC METABOLIC PANEL
Anion gap: 9 (ref 5–15)
BUN: 26 mg/dL — AB (ref 6–20)
CALCIUM: 9.2 mg/dL (ref 8.9–10.3)
CO2: 27 mmol/L (ref 22–32)
CREATININE: 1.97 mg/dL — AB (ref 0.61–1.24)
Chloride: 107 mmol/L (ref 101–111)
GFR calc Af Amer: 38 mL/min — ABNORMAL LOW (ref >60)
GFR, EST NON AFRICAN AMERICAN: 33 mL/min — AB (ref >60)
GLUCOSE: 97 mg/dL (ref 65–99)
Potassium: 4.1 mmol/L (ref 3.5–5.1)
Sodium: 143 mmol/L (ref 135–145)

## 2016-12-15 LAB — CBC WITH DIFFERENTIAL/PLATELET
BAND NEUTROPHILS: 0 %
BASOS PCT: 0 %
BLASTS: 0 %
Basophils Absolute: 0 10*3/uL (ref 0.0–0.1)
Eosinophils Absolute: 0.2 10*3/uL (ref 0.0–0.7)
Eosinophils Relative: 2 %
HCT: 41.6 % (ref 39.0–52.0)
Hemoglobin: 13.6 g/dL (ref 13.0–17.0)
LYMPHS PCT: 22 %
Lymphs Abs: 2.2 10*3/uL (ref 0.7–4.0)
MCH: 31.3 pg (ref 26.0–34.0)
MCHC: 32.7 g/dL (ref 30.0–36.0)
MCV: 95.6 fL (ref 78.0–100.0)
MONOS PCT: 14 %
Metamyelocytes Relative: 0 %
Monocytes Absolute: 1.4 10*3/uL — ABNORMAL HIGH (ref 0.1–1.0)
Myelocytes: 0 %
NEUTROS ABS: 6 10*3/uL (ref 1.7–7.7)
NEUTROS PCT: 62 %
NRBC: 0 /100{WBCs}
OTHER: 0 %
PLATELETS: 282 10*3/uL (ref 150–400)
PROMYELOCYTES ABS: 0 %
RBC: 4.35 MIL/uL (ref 4.22–5.81)
RDW: 14 % (ref 11.5–15.5)
WBC: 9.8 10*3/uL (ref 4.0–10.5)

## 2016-12-15 LAB — PROTIME-INR
INR: 1.01
Prothrombin Time: 13.3 seconds (ref 11.4–15.2)

## 2016-12-15 LAB — TROPONIN I: Troponin I: <0.03 ng/mL (ref <0.03)

## 2016-12-15 LAB — APTT: aPTT: 31 seconds (ref 24–36)

## 2016-12-15 NOTE — Discharge Instructions (Signed)
Return here on Sunday to have your repeat CT scan.  If this is negative, the next step will be to see an ENT specialist.  See the referrals above. Dr Redmond Baseman is the on call doctor for [place] today,but if you refer to see a more local doctor, Dr Benjamine Mola is in [place] on Thursdays.

## 2016-12-15 NOTE — ED Notes (Signed)
EKG given to Dr. Cook  

## 2016-12-15 NOTE — ED Triage Notes (Signed)
Pt c/o decreased hearing in bilateral ears that started this morning. Denies pain.

## 2016-12-15 NOTE — ED Provider Notes (Signed)
[facility] DEPT Provider Note   CSN: [id number] Arrival date & time: 12/15/16  1533     History   Chief Complaint Chief Complaint  Patient presents with  . Ear Fullness    HPI [name] is a 71 y.o.  male with a history most significant for DM, CAD, MI, HTN and  peripheral vascular disease significant for AAA and carotid artery disease (followed by Dr Early) and distant history of Bells palsy Who woke with complete loss of hearing in his right ear this am. He denies having any hearing issues in this ear prior to today, does have some reduced hearing in the left at baseline.  He denies recent URI, sinus or nasal congestion or drainage, also no drainage from his ears, denies fevers or chills, no dizziness, headache or focal weakness.  His also had no recent exposures to loud noises, no recent travel or significant changes in elevation.  Denies history of prior hearing problems.   .  The history is provided by the patient.    Past Medical History:  Diagnosis Date  . AAA (abdominal aortic aneurysm) ([place])    Followed by Dr. Donnetta Hutching  . AICD (automatic cardioverter/defibrillator) present   . Angioedema    Secondary to ACE inhibitor  . Arthritis   . Bell palsy   . Carotid artery disease ([place])   . Coronary atherosclerosis of native coronary artery    Multivessel status post CABG  . Essential hypertension, benign   . Gout   . Hernia of abdominal wall   . Hypercholesteremia   . Ischemic cardiomyopathy    LVEF 45-50% 11/2011  . Migraines   . Myocardial infarction ([place])   . Nephrolithiasis   . PAD (peripheral artery disease) ([place])    Followed by Dr. Donnetta Hutching  . Sleep apnea   . Type 2 diabetes mellitus [facility])     Patient Active Problem List   Diagnosis Date Noted  . ICD (implantable cardioverter-defibrillator) malfunction 02/19/2015  . Claudication ([place]) 02/04/2015  . S/P ICD (internal cardiac defibrillator) procedure 11/05/2014  . Cardiomyopathy, ischemic 10/20/2014    . LBBB (left bundle branch block) 10/01/2014  . PVD - h/o AAA stent graft, popliteal aneurysm repair 10/01/2014  . Unstable angina ([place]) 09/30/2014  . Acute kidney injury ([place]) 09/30/2014  . Chronic renal impairment, stage 3 (moderate) 09/30/2014  . Pre-diabetes 09/30/2014  . Chronic systolic congestive heart failure ([place]) 09/30/2014  . Peripheral neuropathy 09/30/2014  . Essential hypertension 09/30/2014  . Gout 09/30/2014  . HLD (hyperlipidemia) 09/30/2014  . Frequent PVCs   . Diabetes type 2, controlled ([place]) 06/22/2014  . Aneurysm of left femoral artery ([place]) 06/08/2014  . Popliteal artery aneurysm ([place]) 09/23/2013  . Preoperative cardiovascular examination 08/29/2013  . Abdominal aneurysm without mention of rupture 03/05/2012  . ICM-EF 35% by echo [facility] April 2016   . Carotid artery disease ([place])   . Hx of CABG 2001, abnormal but low risk Myoview April 2015 11/26/2009  . ATHEROSLERO NATV ART EXTREM W/INTERMIT CLAUDICAT 09/23/2009  . Aneurysm of artery of lower extremity ([place]) 05/25/2009  . DIABETIC PERIPHERAL NEUROPATHY 03/30/2009  . DYSLIPIDEMIA 02/22/2009  . Chronic systolic heart failure ([place]) 02/22/2009    Past Surgical History:  Procedure Laterality Date  . ABDOMINAL AORTIC ANEURYSM REPAIR  November 19, 2013   [facility] :  Dr. Sammuel Hines  . ABDOMINAL AORTIC ANEURYSM REPAIR  09-07-2014   [facility]  . BI-VENTRICULAR IMPLANTABLE CARDIOVERTER DEFIBRILLATOR  (CRT-D)  11/05/2014  .  BYPASS GRAFT POPLITEAL TO POPLITEAL Left 06/08/2014   Procedure: BYPASS GRAFT FEMORAL ARTERY TO ABOVE KNEE POPLITEAL;  Surgeon: Rosetta Posner, MD;  Location: [place];  Service: Vascular;  Laterality: Left;  . CARDIAC CATHETERIZATION N/A 10/02/2014   Procedure: Left Heart Cath and Cors/Grafts Angiography;  Surgeon: Belva Crome, MD;  Location: [place] [facility];  Service: Cardiovascular;  Laterality: N/A;  . CARDIAC CATHETERIZATION  "several"  . CATARACT EXTRACTION W/PHACO  03/23/2011   Procedure:  CATARACT EXTRACTION PHACO AND INTRAOCULAR LENS PLACEMENT (IOC);  Surgeon: Tonny Branch;  Location: [facility];  Service: Ophthalmology;  Laterality: Left;  CDE: 15.99  . CATARACT EXTRACTION W/PHACO  04/17/2011   Procedure: CATARACT EXTRACTION PHACO AND INTRAOCULAR LENS PLACEMENT (IOC);  Surgeon: Tonny Branch;  Location: [facility];  Service: Ophthalmology;  Laterality: Right;  CDE: 23.45  . CORONARY ARTERY BYPASS GRAFT  2001   LIMA to LAD, SVG to diagonal and ramus, SVG to OM, SVG to AM  . DUPUYTREN CONTRACTURE RELEASE Left 2009  . EP IMPLANTABLE DEVICE N/A 11/05/2014   Procedure: BiV ICD Insertion CRT-D;  Surgeon: Evans Lance, MD;  Location: [place] [facility];  Service: Cardiovascular;  Laterality: N/A;  . EP IMPLANTABLE DEVICE N/A 02/24/2015   Procedure: Lead Revision/Repair;  Surgeon: Evans Lance, MD;  Location: [place] [facility];  Service: Cardiovascular;  Laterality: N/A;  . EXTRACORPOREAL SHOCK WAVE LITHOTRIPSY  X 1  . EYE SURGERY    . FOOT SURGERY Left 1963   "gangrene"  . ICD LEAD REMOVAL  02/24/2015  . LOWER EXTREMITY ANGIOGRAM Bilateral 04/28/2015   Procedure: Lower Extremity Angiogram;  Surgeon: Rosetta Posner, MD;  Location: [place] [facility];  Service: Cardiovascular;  Laterality: Bilateral;  . PERIPHERAL VASCULAR CATHETERIZATION N/A 04/28/2015   Procedure: Abdominal Aortogram;  Surgeon: Rosetta Posner, MD;  Location: [place] [facility];  Service: Cardiovascular;  Laterality: N/A;       Home Medications    Prior to Admission medications   Medication Sig Start Date End Date Taking? Authorizing Provider  acetaminophen (TYLENOL) 500 MG tablet Take 1,000 mg by mouth once as needed for mild pain, moderate pain or headache (pain).     [provider]  allopurinol (ZYLOPRIM) 100 MG tablet Take 100 mg by mouth daily with supper.     [provider]  amLODipine (NORVASC) 5 MG tablet TAKE 1 TABLET BY MOUTH ONCE DAILY. PATIENT NEEDS TO BE SEEN. 10/19/16   Satira Sark, MD    aspirin EC 81 MG tablet Take 81 mg by mouth daily.    [provider]  carvedilol (COREG) 25 MG tablet TAKE (1) TABLET TWICE DAILY. 11/23/16   Satira Sark, MD  furosemide (LASIX) 80 MG tablet TAKE (1) TABLET TWICE DAILY. 06/30/16   Satira Sark, MD  gabapentin (NEURONTIN) 300 MG capsule Take 300 mg by mouth 2 (two) times daily.    [provider]  glipiZIDE (GLUCOTROL XL) 10 MG 24 hr tablet Take 10 mg by mouth 2 (two) times daily.  05/08/14   [provider]  isosorbide mononitrate (IMDUR) 60 MG 24 hr tablet Take 30-120 mg by mouth 2 (two) times daily. 120 mg am, 30 mg pm    [provider]  linagliptin (TRADJENTA) 5 MG TABS tablet Take 5 mg by mouth daily.      [provider]  nitroGLYCERIN (NITROSTAT) 0.4 MG SL tablet PLACE 0.4 MG  UNDER THE TONGUE EVERY 5 MINUTES  UP TO 3 DOSES AS NEEDED FOR CHEST PAIN. 03/12/15   Satira Sark, MD  potassium chloride (K-DUR) 10 MEQ tablet TAKE 2 TABLETS BY MOUTH ONCE DAILY. 06/30/16   Satira Sark, MD  simvastatin (ZOCOR) 20 MG tablet Take 1 tablet (20 mg total) by mouth at bedtime. 12/27/15   Satira Sark, MD    Family History Family History  Problem Relation Age of Onset  . Diabetes Mother        Type  I   . Varicose Veins Mother   . Heart disease Father 25       AAA  . AAA (abdominal aortic aneurysm) Father   . Hyperlipidemia Father   . Hypertension Father     Social History Social History  Substance Use Topics  . Smoking status: Former Smoker    Packs/day: 1.00    Years: 25.00    Types: Cigarettes    Start date: 02/28/1979    Quit date: 05/23/2007  . Smokeless tobacco: Never Used  . Alcohol use No     Allergies   Ace inhibitors; Codeine; and Hydromorphone   Review of Systems Review of Systems  Constitutional: Negative for fever.  HENT: Positive for hearing loss. Negative for congestion, ear discharge, ear pain, facial swelling, rhinorrhea, sinus pain, sinus  pressure, sore throat and tinnitus.   Eyes: Negative.   Respiratory: Negative for chest tightness and shortness of breath.   Cardiovascular: Negative for chest pain.  Gastrointestinal: Negative for nausea and vomiting.  Genitourinary: Negative.   Musculoskeletal: Negative for arthralgias and neck pain.  Skin: Negative.  Negative for rash.  Neurological: Negative for dizziness, weakness, light-headedness, numbness and headaches.  Psychiatric/Behavioral: Negative.      Physical Exam Updated Vital Signs BP 138/60   Pulse 68   Temp 98.5 F (36.9 C) (Oral)   Resp 18   Ht 6\' 1"  (1.854 m)   Wt 111.1 kg (245 lb)   SpO2 94%   BMI 32.32 kg/m   Physical Exam  Constitutional: He appears well-developed and well-nourished.  HENT:  Head: Normocephalic and atraumatic.  Right Ear: Tympanic membrane and ear canal normal. No drainage. Decreased hearing is noted.  Left Ear: Tympanic membrane and ear canal normal. No drainage. Decreased hearing is noted.  Patient has 0 hearing ability in the right ear.  He is able to hear the tuning fork with his left ear, air conduction greater than bone conduction.  There is no lateralizing hearing.  Eyes: Conjunctivae are normal.  Neck: Normal range of motion.  Cardiovascular: Normal rate.   Pulmonary/Chest: Effort normal.  Musculoskeletal: Normal range of motion.  Neurological: He is alert. He has normal strength. A cranial nerve deficit is present. No sensory deficit.  Cranial nerves 3-7 and 9-12 intact.  Skin: Skin is warm and dry.  Psychiatric: He has a normal mood and affect.  Nursing note and vitals reviewed.    ED Treatments / Results  Labs (all labs ordered are listed, but only abnormal results are displayed) Labs Reviewed  CBC WITH DIFFERENTIAL/PLATELET - Abnormal; Notable for the following:       Result Value   Monocytes Absolute 1.4 (*)    All other components within normal limits  BASIC METABOLIC PANEL - Abnormal; Notable for the  following:    BUN 26 (*)    Creatinine, Ser 1.97 (*)    GFR calc non Af Amer 33 (*)    GFR calc Af Amer 38 (*)    All  other components within normal limits  PROTIME-INR  APTT  TROPONIN I    EKG  EKG Interpretation None       Radiology Ct Head Wo Contrast  Result Date: 12/15/2016 CLINICAL DATA:  Hearing loss EXAM: CT HEAD WITHOUT CONTRAST TECHNIQUE: Contiguous axial images were obtained from the base of the skull through the vertex without intravenous contrast. COMPARISON:  Report 03/18/2011 FINDINGS: Brain: No acute territorial infarction, hemorrhage or intracranial mass is seen. Old appearing lacunar infarcts within the bilateral basal ganglia and left thalamus. Ventricle size is within normal limits. Mild atrophy. Vascular: No hyperdense vessels. Scattered calcifications at the carotid siphons and the vertebral artery. Skull: No fracture or suspicious bone lesion. Mastoid air cells are clear. Sinuses/Orbits: No acute finding. Other: None IMPRESSION: 1. No definite CT evidence for acute intracranial abnormality. Temporal bone CT and/ or MRI follow-up as clinically indicated. 2. Atrophy and old appearing lacunar infarcts in the basal ganglia and left thalamus. Electronically Signed   By: Donavan Foil M.D.   On: 12/15/2016 17:46    Procedures Procedures (including critical care time)  Medications Ordered in ED Medications - No data to display   Initial Impression / Assessment and Plan / ED Course  I have reviewed the triage vital signs and the nursing notes.  Pertinent labs & imaging results that were available during my care of the patient were reviewed by me and considered in my medical decision making (see chart for details).     Discussed with patient with Dr. Malen Gauze, neuro hospitalist at [facility] who recommends MRI for this patient given his CT scan is unrevealing.  However he has been ICD which is not compatible for MRI, next step then would be a repeat CT scan in 48  hours to assess for ischemic changes.  If this is negative, ENT should be follow-up for evaluation of peripheral source of patient's hearing loss.  Discussed with patient who understands plan and will return on Sunday for repeat CT scan.  Final Clinical Impressions(s) / ED Diagnoses   Final diagnoses:  Acute hearing loss of right ear    New Prescriptions New Prescriptions   No medications on file     Landis Martins 12/15/16 Alexander Bergeron, MD 12/21/16 [phone]

## 2016-12-15 NOTE — ED Triage Notes (Signed)
Reports awakened this am with diminished hearing to both ears

## 2016-12-17 ENCOUNTER — Encounter (HOSPITAL_COMMUNITY): Payer: Self-pay | Admitting: Emergency Medicine

## 2016-12-17 ENCOUNTER — Emergency Department (HOSPITAL_COMMUNITY)
Admission: EM | Admit: 2016-12-17 | Discharge: 2016-12-17 | Disposition: A | Discharge status: Home / Self Care | Payer: Medicare Other | Attending: Emergency Medicine | Admitting: Emergency Medicine

## 2016-12-17 ENCOUNTER — Emergency Department (HOSPITAL_COMMUNITY): Payer: Medicare Other

## 2016-12-17 DIAGNOSIS — I2510 Atherosclerotic heart disease of native coronary artery without angina pectoris: Secondary | ICD-10-CM | POA: Diagnosis not present

## 2016-12-17 DIAGNOSIS — Z951 Presence of aortocoronary bypass graft: Secondary | ICD-10-CM | POA: Insufficient documentation

## 2016-12-17 DIAGNOSIS — Z7982 Long term (current) use of aspirin: Secondary | ICD-10-CM | POA: Insufficient documentation

## 2016-12-17 DIAGNOSIS — E1140 Type 2 diabetes mellitus with diabetic neuropathy, unspecified: Secondary | ICD-10-CM | POA: Diagnosis not present

## 2016-12-17 DIAGNOSIS — Z7984 Long term (current) use of oral hypoglycemic drugs: Secondary | ICD-10-CM | POA: Insufficient documentation

## 2016-12-17 DIAGNOSIS — Z79899 Other long term (current) drug therapy: Secondary | ICD-10-CM | POA: Insufficient documentation

## 2016-12-17 DIAGNOSIS — Z87891 Personal history of nicotine dependence: Secondary | ICD-10-CM | POA: Insufficient documentation

## 2016-12-17 DIAGNOSIS — H9191 Unspecified hearing loss, right ear: Secondary | ICD-10-CM | POA: Insufficient documentation

## 2016-12-17 DIAGNOSIS — G510 Bell's palsy: Secondary | ICD-10-CM | POA: Insufficient documentation

## 2016-12-17 DIAGNOSIS — I110 Hypertensive heart disease with heart failure: Secondary | ICD-10-CM | POA: Insufficient documentation

## 2016-12-17 DIAGNOSIS — I5022 Chronic systolic (congestive) heart failure: Secondary | ICD-10-CM | POA: Diagnosis not present

## 2016-12-17 NOTE — ED Provider Notes (Signed)
[facility] DEPT Provider Note   CSN: [id number] Arrival date & time: 12/17/16  0737     History   Chief Complaint Chief Complaint  Patient presents with  . Hearing Loss    HPI [name] is a 71 y.o. male.  Pt presents to the ED today b/c he was told to return today for a ct scan of his head.  The pt initially presented here on 7/27 after waking up unable to hear from his right ear.  The pt said the right ear was his good ear.  He had an initial CT head on the 27th that was ok.  He has an AICD, so was unable to get a MRI.  He was d/w neurohospitalist who recommended returning today for a repeat CT.  If there was no evidence of ischemia today, he should f/u with ENT.  Pt said he's had no improvement in his hearing.  He denies any other neuro deficits.      Past Medical History:  Diagnosis Date  . AAA (abdominal aortic aneurysm) ([place])    Followed by Dr. Donnetta Hutching  . AICD (automatic cardioverter/defibrillator) present   . Angioedema    Secondary to ACE inhibitor  . Arthritis   . Bell palsy   . Carotid artery disease ([place])   . Coronary atherosclerosis of native coronary artery    Multivessel status post CABG  . Essential hypertension, benign   . Gout   . Hernia of abdominal wall   . Hypercholesteremia   . Ischemic cardiomyopathy    LVEF 45-50% 11/2011  . Migraines   . Myocardial infarction ([place])   . Nephrolithiasis   . PAD (peripheral artery disease) ([place])    Followed by Dr. Donnetta Hutching  . Sleep apnea   . Type 2 diabetes mellitus [facility])     Patient Active Problem List   Diagnosis Date Noted  . ICD (implantable cardioverter-defibrillator) malfunction 02/19/2015  . Claudication ([place]) 02/04/2015  . S/P ICD (internal cardiac defibrillator) procedure 11/05/2014  . Cardiomyopathy, ischemic 10/20/2014  . LBBB (left bundle branch block) 10/01/2014  . PVD - h/o AAA stent graft, popliteal aneurysm repair 10/01/2014  . Unstable angina ([place]) 09/30/2014  . Acute kidney  injury ([place]) 09/30/2014  . Chronic renal impairment, stage 3 (moderate) 09/30/2014  . Pre-diabetes 09/30/2014  . Chronic systolic congestive heart failure ([place]) 09/30/2014  . Peripheral neuropathy 09/30/2014  . Essential hypertension 09/30/2014  . Gout 09/30/2014  . HLD (hyperlipidemia) 09/30/2014  . Frequent PVCs   . Diabetes type 2, controlled ([place]) 06/22/2014  . Aneurysm of left femoral artery ([place]) 06/08/2014  . Popliteal artery aneurysm ([place]) 09/23/2013  . Preoperative cardiovascular examination 08/29/2013  . Abdominal aneurysm without mention of rupture 03/05/2012  . ICM-EF 35% by echo [facility] April 2016   . Carotid artery disease ([place])   . Hx of CABG 2001, abnormal but low risk Myoview April 2015 11/26/2009  . ATHEROSLERO NATV ART EXTREM W/INTERMIT CLAUDICAT 09/23/2009  . Aneurysm of artery of lower extremity ([place]) 05/25/2009  . DIABETIC PERIPHERAL NEUROPATHY 03/30/2009  . DYSLIPIDEMIA 02/22/2009  . Chronic systolic heart failure ([place]) 02/22/2009    Past Surgical History:  Procedure Laterality Date  . ABDOMINAL AORTIC ANEURYSM REPAIR  November 19, 2013   [facility] :  Dr. Sammuel Hines  . ABDOMINAL AORTIC ANEURYSM REPAIR  09-07-2014   [facility]  . BI-VENTRICULAR IMPLANTABLE CARDIOVERTER DEFIBRILLATOR  (CRT-D)  11/05/2014  . BYPASS GRAFT POPLITEAL TO POPLITEAL Left 06/08/2014   Procedure: BYPASS  GRAFT FEMORAL ARTERY TO ABOVE KNEE POPLITEAL;  Surgeon: Rosetta Posner, MD;  Location: [place];  Service: Vascular;  Laterality: Left;  . CARDIAC CATHETERIZATION N/A 10/02/2014   Procedure: Left Heart Cath and Cors/Grafts Angiography;  Surgeon: Belva Crome, MD;  Location: [place] CV LAB;  Service: Cardiovascular;  Laterality: N/A;  . CARDIAC CATHETERIZATION  "several"  . CATARACT EXTRACTION W/PHACO  03/23/2011   Procedure: CATARACT EXTRACTION PHACO AND INTRAOCULAR LENS PLACEMENT (IOC);  Surgeon: Tonny Branch;  Location: [facility];  Service: Ophthalmology;  Laterality: Left;  CDE: 15.99  .  CATARACT EXTRACTION W/PHACO  04/17/2011   Procedure: CATARACT EXTRACTION PHACO AND INTRAOCULAR LENS PLACEMENT (IOC);  Surgeon: Tonny Branch;  Location: [facility];  Service: Ophthalmology;  Laterality: Right;  CDE: 23.45  . CORONARY ARTERY BYPASS GRAFT  2001   LIMA to LAD, SVG to diagonal and ramus, SVG to OM, SVG to AM  . DUPUYTREN CONTRACTURE RELEASE Left 2009  . EP IMPLANTABLE DEVICE N/A 11/05/2014   Procedure: BiV ICD Insertion CRT-D;  Surgeon: Evans Lance, MD;  Location: [place] CV LAB;  Service: Cardiovascular;  Laterality: N/A;  . EP IMPLANTABLE DEVICE N/A 02/24/2015   Procedure: Lead Revision/Repair;  Surgeon: Evans Lance, MD;  Location: [place] CV LAB;  Service: Cardiovascular;  Laterality: N/A;  . EXTRACORPOREAL SHOCK WAVE LITHOTRIPSY  X 1  . EYE SURGERY    . FOOT SURGERY Left 1963   "gangrene"  . ICD LEAD REMOVAL  02/24/2015  . LOWER EXTREMITY ANGIOGRAM Bilateral 04/28/2015   Procedure: Lower Extremity Angiogram;  Surgeon: Rosetta Posner, MD;  Location: [place] CV LAB;  Service: Cardiovascular;  Laterality: Bilateral;  . PERIPHERAL VASCULAR CATHETERIZATION N/A 04/28/2015   Procedure: Abdominal Aortogram;  Surgeon: Rosetta Posner, MD;  Location: [place] CV LAB;  Service: Cardiovascular;  Laterality: N/A;       Home Medications    Prior to Admission medications   Medication Sig Start Date End Date Taking? Authorizing Provider  acetaminophen (TYLENOL) 500 MG tablet Take 1,000 mg by mouth once as needed for mild pain, moderate pain or headache (pain).    Yes [provider]  allopurinol (ZYLOPRIM) 100 MG tablet Take 100 mg by mouth daily with supper.    Yes [provider]  amLODipine (NORVASC) 5 MG tablet TAKE 1 TABLET BY MOUTH ONCE DAILY. PATIENT NEEDS TO BE SEEN. 10/19/16  Yes Satira Sark, MD  aspirin EC 81 MG tablet Take 81 mg by mouth daily.   Yes [provider]  carvedilol (COREG) 25 MG tablet TAKE (1) TABLET TWICE DAILY. 11/23/16   Yes Satira Sark, MD  furosemide (LASIX) 80 MG tablet TAKE (1) TABLET TWICE DAILY. 06/30/16  Yes Satira Sark, MD  gabapentin (NEURONTIN) 300 MG capsule Take 300 mg by mouth 2 (two) times daily.   Yes [provider]  glipiZIDE (GLUCOTROL XL) 10 MG 24 hr tablet Take 10 mg by mouth 2 (two) times daily.  05/08/14  Yes [provider]  isosorbide mononitrate (IMDUR) 60 MG 24 hr tablet Take 30-120 mg by mouth 2 (two) times daily. 120 mg am, 30 mg pm   Yes [provider]  nitroGLYCERIN (NITROSTAT) 0.4 MG SL tablet PLACE 0.4 MG  UNDER THE TONGUE EVERY 5 MINUTES UP TO 3 DOSES AS NEEDED FOR CHEST PAIN. 03/12/15  Yes Satira Sark, MD  potassium chloride (K-DUR) 10 MEQ tablet TAKE 2 TABLETS BY MOUTH ONCE DAILY. 06/30/16  Yes  Satira Sark, MD  simvastatin (ZOCOR) 40 MG tablet Take 40 mg by mouth daily.   Yes [provider]    Family History Family History  Problem Relation Age of Onset  . Diabetes Mother        Type  I   . Varicose Veins Mother   . Heart disease Father 21       AAA  . AAA (abdominal aortic aneurysm) Father   . Hyperlipidemia Father   . Hypertension Father     Social History Social History  Substance Use Topics  . Smoking status: Former Smoker    Packs/day: 1.00    Years: 25.00    Types: Cigarettes    Start date: 02/28/1979    Quit date: 05/23/2007  . Smokeless tobacco: Never Used  . Alcohol use No     Allergies   Ace inhibitors; Codeine; and Hydromorphone   Review of Systems Review of Systems  HENT: Positive for hearing loss.   All other systems reviewed and are negative.    Physical Exam Updated Vital Signs BP (!) 156/59 (BP Location: Left Arm)   Pulse (!) 58   Temp 98.2 F (36.8 C) (Oral)   Resp 16   Ht 6\' 1"  (1.854 m)   Wt 111.1 kg (245 lb)   SpO2 96%   BMI 32.32 kg/m   Physical Exam  Constitutional: He is oriented to person, place, and time. He appears well-developed and well-nourished.    HENT:  Head: Normocephalic and atraumatic.  Right Ear: External ear normal. Decreased hearing is noted.  Left Ear: External ear normal.  Nose: Nose normal.  Mouth/Throat: Oropharynx is clear and moist.  Eyes: Pupils are equal, round, and reactive to light. Conjunctivae and EOM are normal.  Neck: Normal range of motion. Neck supple.  Cardiovascular: Normal rate, regular rhythm, normal heart sounds and intact distal pulses.   Pulmonary/Chest: Effort normal and breath sounds normal.  Abdominal: Soft. Bowel sounds are normal.  Musculoskeletal: Normal range of motion.  Neurological: He is alert and oriented to person, place, and time.  Skin: Skin is warm.  Psychiatric: He has a normal mood and affect. His behavior is normal. Judgment and thought content normal.  Nursing note and vitals reviewed.    ED Treatments / Results  Labs (all labs ordered are listed, but only abnormal results are displayed) Labs Reviewed - No data to display  EKG  EKG Interpretation None       Radiology Ct Head Wo Contrast  Result Date: 12/17/2016 CLINICAL DATA:  Pt states that he was seen here on Friday due to hearing loss. Pt states he had a CT of the head done and nothing was found. He states that he was told if he had stroke that it would not show on CT for two days and to come back to be evaluated. EXAM: CT HEAD WITHOUT CONTRAST TECHNIQUE: Contiguous axial images were obtained from the base of the skull through the vertex without intravenous contrast. COMPARISON:  12/15/2016 FINDINGS: Brain: No evidence of acute infarction, hemorrhage, hydrocephalus, extra-axial collection or mass lesion/mass effect. Stable thalamic, and bilateral smaller basal ganglia lacunar infarcts. Vascular: No hyperdense vessel or unexpected calcification. Skull: Normal. Negative for fracture or focal lesion. Sinuses/Orbits: Small retention cyst or polyp in the right maxillary sinus. Otherwise negative. Other: None. IMPRESSION: 1.  Negative for bleed or other acute intracranial process Electronically Signed   By: Lucrezia Europe M.D.   On: 12/17/2016 09:59   Ct Head  Wo Contrast  Result Date: 12/15/2016 CLINICAL DATA:  Hearing loss EXAM: CT HEAD WITHOUT CONTRAST TECHNIQUE: Contiguous axial images were obtained from the base of the skull through the vertex without intravenous contrast. COMPARISON:  Report 03/18/2011 FINDINGS: Brain: No acute territorial infarction, hemorrhage or intracranial mass is seen. Old appearing lacunar infarcts within the bilateral basal ganglia and left thalamus. Ventricle size is within normal limits. Mild atrophy. Vascular: No hyperdense vessels. Scattered calcifications at the carotid siphons and the vertebral artery. Skull: No fracture or suspicious bone lesion. Mastoid air cells are clear. Sinuses/Orbits: No acute finding. Other: None IMPRESSION: 1. No definite CT evidence for acute intracranial abnormality. Temporal bone CT and/ or MRI follow-up as clinically indicated. 2. Atrophy and old appearing lacunar infarcts in the basal ganglia and left thalamus. Electronically Signed   By: Donavan Foil M.D.   On: 12/15/2016 17:46    Procedures Procedures (including critical care time)  Medications Ordered in ED Medications - No data to display   Initial Impression / Assessment and Plan / ED Course  I have reviewed the triage vital signs and the nursing notes.  Pertinent labs & imaging results that were available during my care of the patient were reviewed by me and considered in my medical decision making (see chart for details).    Repeat CT scan is ok.  Pt is told to f/u with ENT.    Final Clinical Impressions(s) / ED Diagnoses   Final diagnoses:  Hearing loss of right ear, unspecified hearing loss type    New Prescriptions New Prescriptions   No medications on file     Isla Pence, MD 12/17/16 1014

## 2016-12-17 NOTE — ED Triage Notes (Signed)
Pt states that he was seen here on Friday due to hearing loss.  Pt states he had a CT of the head done and nothing was found.  He states that he was told if he had stroke that it would not show on CT for two days and to come back to be evaluated.  Pt is in NAD.  Pt denies pain at this time.

## 2016-12-19 LAB — CUP PACEART REMOTE DEVICE CHECK
Battery Remaining Longevity: 44 mo
Battery Remaining Percentage: 67 %
Battery Voltage: 2.95 V
Brady Statistic RA Percent Paced: 25 %
HIGH POWER IMPEDANCE MEASURED VALUE: 70 Ohm
HighPow Impedance: 70 Ohm
Implantable Lead Implant Date: 20160616
Implantable Lead Implant Date: 20161005
Implantable Lead Location: 753860
Implantable Lead Model: 7122
Implantable Pulse Generator Implant Date: 20160616
Lead Channel Impedance Value: 380 Ohm
Lead Channel Impedance Value: 480 Ohm
Lead Channel Impedance Value: 730 Ohm
Lead Channel Pacing Threshold Amplitude: 0.625 V
Lead Channel Pacing Threshold Amplitude: 2 V
Lead Channel Pacing Threshold Pulse Width: 0.5 ms
Lead Channel Sensing Intrinsic Amplitude: 12 mV
Lead Channel Setting Pacing Amplitude: 3 V
Lead Channel Setting Sensing Sensitivity: 0.5 mV
MDC IDC LEAD IMPLANT DT: 20161005
MDC IDC LEAD LOCATION: 753858
MDC IDC LEAD LOCATION: 753859
MDC IDC MSMT LEADCHNL LV PACING THRESHOLD PULSEWIDTH: 1 ms
MDC IDC MSMT LEADCHNL RA PACING THRESHOLD AMPLITUDE: 0.75 V
MDC IDC MSMT LEADCHNL RA SENSING INTR AMPL: 1.7 mV
MDC IDC MSMT LEADCHNL RV PACING THRESHOLD PULSEWIDTH: 0.5 ms
MDC IDC PG SERIAL: 7288352
MDC IDC SESS DTM: 20180705060023
MDC IDC SET LEADCHNL LV PACING PULSEWIDTH: 1 ms
MDC IDC SET LEADCHNL RA PACING AMPLITUDE: 1.75 V
MDC IDC SET LEADCHNL RV PACING AMPLITUDE: 2 V
MDC IDC SET LEADCHNL RV PACING PULSEWIDTH: 0.5 ms
MDC IDC STAT BRADY AP VP PERCENT: 28 %
MDC IDC STAT BRADY AP VS PERCENT: 1 %
MDC IDC STAT BRADY AS VP PERCENT: 68 %
MDC IDC STAT BRADY AS VS PERCENT: 1.6 %

## 2017-01-23 DIAGNOSIS — N183 Chronic kidney disease, stage 3 (moderate): Secondary | ICD-10-CM | POA: Diagnosis not present

## 2017-01-23 DIAGNOSIS — R809 Proteinuria, unspecified: Secondary | ICD-10-CM | POA: Diagnosis not present

## 2017-01-23 DIAGNOSIS — I10 Essential (primary) hypertension: Secondary | ICD-10-CM | POA: Diagnosis not present

## 2017-01-23 DIAGNOSIS — E1129 Type 2 diabetes mellitus with other diabetic kidney complication: Secondary | ICD-10-CM | POA: Diagnosis not present

## 2017-02-12 ENCOUNTER — Other Ambulatory Visit: Payer: Self-pay | Admitting: Cardiology

## 2017-02-22 ENCOUNTER — Ambulatory Visit (INDEPENDENT_AMBULATORY_CARE_PROVIDER_SITE_OTHER): Payer: Medicare Other | Admitting: *Deleted

## 2017-02-22 DIAGNOSIS — I255 Ischemic cardiomyopathy: Secondary | ICD-10-CM | POA: Diagnosis not present

## 2017-02-22 NOTE — Progress Notes (Signed)
Remote ICD transmission.   

## 2017-02-28 LAB — CUP PACEART REMOTE DEVICE CHECK
Battery Voltage: 2.95 V
Brady Statistic AP VS Percent: 1 %
Brady Statistic AS VP Percent: 65 %
Date Time Interrogation Session: 20181004083454
HighPow Impedance: 72 Ohm
HighPow Impedance: 72 Ohm
Implantable Lead Implant Date: 20161005
Implantable Lead Location: 753859
Implantable Lead Model: 7122
Lead Channel Impedance Value: 400 Ohm
Lead Channel Impedance Value: 480 Ohm
Lead Channel Impedance Value: 760 Ohm
Lead Channel Pacing Threshold Amplitude: 0.75 V
Lead Channel Sensing Intrinsic Amplitude: 1.6 mV
Lead Channel Sensing Intrinsic Amplitude: 12 mV
Lead Channel Setting Pacing Amplitude: 1.75 V
Lead Channel Setting Pacing Amplitude: 2 V
Lead Channel Setting Pacing Pulse Width: 0.5 ms
Lead Channel Setting Pacing Pulse Width: 1 ms
MDC IDC LEAD IMPLANT DT: 20160616
MDC IDC LEAD IMPLANT DT: 20161005
MDC IDC LEAD LOCATION: 753858
MDC IDC LEAD LOCATION: 753860
MDC IDC MSMT BATTERY REMAINING LONGEVITY: 43 mo
MDC IDC MSMT BATTERY REMAINING PERCENTAGE: 64 %
MDC IDC MSMT LEADCHNL LV PACING THRESHOLD AMPLITUDE: 2 V
MDC IDC MSMT LEADCHNL LV PACING THRESHOLD PULSEWIDTH: 1 ms
MDC IDC MSMT LEADCHNL RA PACING THRESHOLD PULSEWIDTH: 0.5 ms
MDC IDC MSMT LEADCHNL RV PACING THRESHOLD AMPLITUDE: 0.75 V
MDC IDC MSMT LEADCHNL RV PACING THRESHOLD PULSEWIDTH: 0.5 ms
MDC IDC PG IMPLANT DT: 20160616
MDC IDC SET LEADCHNL LV PACING AMPLITUDE: 3 V
MDC IDC SET LEADCHNL RV SENSING SENSITIVITY: 0.5 mV
MDC IDC STAT BRADY AP VP PERCENT: 30 %
MDC IDC STAT BRADY AS VS PERCENT: 1.4 %
MDC IDC STAT BRADY RA PERCENT PACED: 27 %
Pulse Gen Serial Number: 7288352

## 2017-03-01 ENCOUNTER — Encounter: Payer: Self-pay | Admitting: Cardiology

## 2017-03-13 DIAGNOSIS — I10 Essential (primary) hypertension: Secondary | ICD-10-CM | POA: Diagnosis not present

## 2017-03-13 DIAGNOSIS — E119 Type 2 diabetes mellitus without complications: Secondary | ICD-10-CM | POA: Diagnosis not present

## 2017-03-13 DIAGNOSIS — E785 Hyperlipidemia, unspecified: Secondary | ICD-10-CM | POA: Diagnosis not present

## 2017-03-14 ENCOUNTER — Other Ambulatory Visit: Payer: Self-pay | Admitting: Cardiology

## 2017-03-16 ENCOUNTER — Encounter: Payer: Self-pay | Admitting: Cardiology

## 2017-03-26 ENCOUNTER — Other Ambulatory Visit: Payer: Self-pay | Admitting: Cardiology

## 2017-05-24 ENCOUNTER — Ambulatory Visit (INDEPENDENT_AMBULATORY_CARE_PROVIDER_SITE_OTHER): Payer: Medicare Other | Admitting: *Deleted

## 2017-05-24 DIAGNOSIS — I255 Ischemic cardiomyopathy: Secondary | ICD-10-CM | POA: Diagnosis not present

## 2017-05-25 NOTE — Progress Notes (Signed)
Remote ICD transmission.   

## 2017-05-28 ENCOUNTER — Encounter: Payer: Self-pay | Admitting: Cardiology

## 2017-06-07 ENCOUNTER — Ambulatory Visit (INDEPENDENT_AMBULATORY_CARE_PROVIDER_SITE_OTHER): Payer: Medicare Other | Admitting: Internal Medicine

## 2017-06-07 ENCOUNTER — Encounter: Payer: Self-pay | Admitting: Internal Medicine

## 2017-06-07 VITALS — BP 146/80 | HR 63 | Ht 73.0 in | Wt 248.0 lb

## 2017-06-07 DIAGNOSIS — I255 Ischemic cardiomyopathy: Secondary | ICD-10-CM

## 2017-06-07 DIAGNOSIS — I5022 Chronic systolic (congestive) heart failure: Secondary | ICD-10-CM | POA: Diagnosis not present

## 2017-06-07 DIAGNOSIS — Z95810 Presence of automatic (implantable) cardiac defibrillator: Secondary | ICD-10-CM | POA: Diagnosis not present

## 2017-06-07 LAB — CUP PACEART INCLINIC DEVICE CHECK
HighPow Impedance: 72 Ohm
Implantable Lead Implant Date: 20160616
Implantable Lead Implant Date: 20161005
Implantable Lead Location: 753858
Implantable Lead Location: 753860
Implantable Lead Model: 7122
Implantable Pulse Generator Implant Date: 20160616
Lead Channel Impedance Value: 540 Ohm
Lead Channel Impedance Value: 790 Ohm
Lead Channel Pacing Threshold Pulse Width: 0.5 ms
Lead Channel Pacing Threshold Pulse Width: 1 ms
Lead Channel Sensing Intrinsic Amplitude: 11.6 mV
Lead Channel Sensing Intrinsic Amplitude: 2.7 mV
Lead Channel Setting Pacing Amplitude: 3 V
Lead Channel Setting Pacing Pulse Width: 0.5 ms
Lead Channel Setting Pacing Pulse Width: 1 ms
MDC IDC LEAD IMPLANT DT: 20161005
MDC IDC LEAD LOCATION: 753859
MDC IDC MSMT BATTERY REMAINING LONGEVITY: 40 mo
MDC IDC MSMT LEADCHNL LV PACING THRESHOLD AMPLITUDE: 2 V
MDC IDC MSMT LEADCHNL RA PACING THRESHOLD AMPLITUDE: 1 V
MDC IDC MSMT LEADCHNL RA PACING THRESHOLD PULSEWIDTH: 0.5 ms
MDC IDC MSMT LEADCHNL RV IMPEDANCE VALUE: 410 Ohm
MDC IDC MSMT LEADCHNL RV PACING THRESHOLD AMPLITUDE: 0.75 V
MDC IDC SESS DTM: 20190117092447
MDC IDC SET LEADCHNL RA PACING AMPLITUDE: 1.875
MDC IDC SET LEADCHNL RV PACING AMPLITUDE: 2 V
MDC IDC SET LEADCHNL RV SENSING SENSITIVITY: 0.5 mV
MDC IDC STAT BRADY RA PERCENT PACED: 29 %
MDC IDC STAT BRADY RV PERCENT PACED: 95 %
Pulse Gen Serial Number: 7288352

## 2017-06-07 NOTE — Progress Notes (Signed)
HPI Mr. Rolfson returns today for ongoing ICD followup. He is a pleasant 72 yo man with chronic systolic heart failure, CAD, s/p CABG, LBBB, who underwent BiV ICD implan. He developed diaphragmatic stimulation and retraction of his LV lead and underwent revision. In the interim, he has lost hearing in his left ear. He is frustrated by his inability to lose weight.  Allergies  Allergen Reactions  . Ace Inhibitors Swelling    Throat and lips swelled; ended up in ED  . Codeine Other (See Comments)     Delirium, hallucinations  . Hydromorphone Nausea And Vomiting     Current Outpatient Medications  Medication Sig Dispense Refill  . acetaminophen (TYLENOL) 500 MG tablet Take 1,000 mg by mouth once as needed for mild pain, moderate pain or headache (pain).     Marland Kitchen allopurinol (ZYLOPRIM) 100 MG tablet Take 100 mg by mouth daily with supper.     Marland Kitchen amLODipine (NORVASC) 5 MG tablet TAKE 1 TABLET BY MOUTH ONCE DAILY. PATIENT NEEDS TO BE SEEN. 15 tablet 0  . aspirin EC 81 MG tablet Take 81 mg by mouth daily.    . carvedilol (COREG) 25 MG tablet TAKE (1) TABLET TWICE DAILY. 60 tablet 2  . furosemide (LASIX) 80 MG tablet TAKE (1) TABLET TWICE DAILY. 60 tablet 2  . gabapentin (NEURONTIN) 300 MG capsule Take 300 mg by mouth 2 (two) times daily.    Marland Kitchen glipiZIDE (GLUCOTROL XL) 10 MG 24 hr tablet Take 10 mg by mouth 2 (two) times daily.     . isosorbide mononitrate (IMDUR) 60 MG 24 hr tablet Take 30-120 mg by mouth 2 (two) times daily. 120 mg am, 30 mg pm    . nitroGLYCERIN (NITROSTAT) 0.4 MG SL tablet PLACE 0.4 MG  UNDER THE TONGUE EVERY 5 MINUTES UP TO 3 DOSES AS NEEDED FOR CHEST PAIN. 25 tablet 3  . potassium chloride (K-DUR) 10 MEQ tablet TAKE 2 TABLETS BY MOUTH ONCE DAILY. 60 tablet 2  . simvastatin (ZOCOR) 40 MG tablet Take 40 mg by mouth daily.     No current facility-administered medications for this visit.      Past Medical History:  Diagnosis Date  . AAA (abdominal aortic aneurysm)  ([place])    Followed by Dr. Donnetta Hutching  . AICD (automatic cardioverter/defibrillator) present   . Angioedema    Secondary to ACE inhibitor  . Arthritis   . Bell palsy   . Carotid artery disease ([place])   . Coronary atherosclerosis of native coronary artery    Multivessel status post CABG  . Essential hypertension, benign   . Gout   . Hernia of abdominal wall   . Hypercholesteremia   . Ischemic cardiomyopathy    LVEF 45-50% 11/2011  . Migraines   . Myocardial infarction ([place])   . Nephrolithiasis   . PAD (peripheral artery disease) ([place])    Followed by Dr. Donnetta Hutching  . Sleep apnea   . Type 2 diabetes mellitus ([facility])     ROS:   All systems reviewed and negative except as noted in the HPI.   Past Surgical History:  Procedure Laterality Date  . ABDOMINAL AORTIC ANEURYSM REPAIR  November 19, 2013   [facility] :  Dr. Sammuel Hines  . ABDOMINAL AORTIC ANEURYSM REPAIR  09-07-2014   [facility]  . BI-VENTRICULAR IMPLANTABLE CARDIOVERTER DEFIBRILLATOR  (CRT-D)  11/05/2014  . BYPASS GRAFT POPLITEAL TO POPLITEAL Left 06/08/2014   Procedure: BYPASS GRAFT FEMORAL ARTERY TO ABOVE  KNEE POPLITEAL;  Surgeon: Rosetta Posner, MD;  Location: [place];  Service: Vascular;  Laterality: Left;  . CARDIAC CATHETERIZATION N/A 10/02/2014   Procedure: Left Heart Cath and Cors/Grafts Angiography;  Surgeon: Belva Crome, MD;  Location: [place] [facility] LAB;  Service: Cardiovascular;  Laterality: N/A;  . CARDIAC CATHETERIZATION  "several"  . CATARACT EXTRACTION W/PHACO  03/23/2011   Procedure: CATARACT EXTRACTION PHACO AND INTRAOCULAR LENS PLACEMENT (IOC);  Surgeon: Tonny Branch;  Location: [facility];  Service: Ophthalmology;  Laterality: Left;  CDE: 15.99  . CATARACT EXTRACTION W/PHACO  04/17/2011   Procedure: CATARACT EXTRACTION PHACO AND INTRAOCULAR LENS PLACEMENT (IOC);  Surgeon: Tonny Branch;  [facility]: [facility];  Service: Ophthalmology;  Laterality: Right;  CDE: 23.45  . CORONARY ARTERY BYPASS GRAFT  2001   LIMA to LAD, SVG to diagonal  and ramus, SVG to OM, SVG to AM  . DUPUYTREN CONTRACTURE RELEASE Left 2009  . EP IMPLANTABLE DEVICE N/A 11/05/2014   Procedure: BiV ICD Insertion CRT-D;  Surgeon: Evans Lance, MD;  Location: [place] CV LAB;  Service: Cardiovascular;  Laterality: N/A;  . EP IMPLANTABLE DEVICE N/A 02/24/2015   Procedure: Lead Revision/Repair;  Surgeon: Evans Lance, MD;  Location: [place] [facility];  Service: Cardiovascular;  Laterality: N/A;  . EXTRACORPOREAL SHOCK WAVE LITHOTRIPSY  X 1  . EYE SURGERY    . FOOT SURGERY Left 1963   "gangrene"  . ICD LEAD REMOVAL  02/24/2015  . LOWER EXTREMITY ANGIOGRAM Bilateral 04/28/2015   Procedure: Lower Extremity Angiogram;  Surgeon: Rosetta Posner, MD;  Location: [place] CV LAB;  Service: Cardiovascular;  Laterality: Bilateral;  . PERIPHERAL VASCULAR CATHETERIZATION N/A 04/28/2015   Procedure: Abdominal Aortogram;  Surgeon: Rosetta Posner, MD;  Location: [place] CV LAB;  Service: Cardiovascular;  Laterality: N/A;     Family History  Problem Relation Age of Onset  . Diabetes Mother        Type  I   . Varicose Veins Mother   . Heart disease Father 86       AAA  . AAA (abdominal aortic aneurysm) Father   . Hyperlipidemia Father   . Hypertension Father      Social History   Socioeconomic History  . Marital status: Divorced    Spouse name: Not on file  . Number of children: Not on file  . Years of education: Not on file  . Highest education level: Not on file  Social Needs  . Financial resource strain: Not on file  . Food insecurity - worry: Not on file  . Food insecurity - inability: Not on file  . Transportation needs - medical: Not on file  . Transportation needs - non-medical: Not on file  Occupational History  . Not on file  Tobacco Use  . Smoking status: Former Smoker    Packs/day: 1.00    Years: 25.00    Pack years: 25.00    Types: Cigarettes    Start date: 02/28/1979    Last attempt to quit: 05/23/2007    Years since quitting: 10.0   . Smokeless tobacco: Never Used  Substance and Sexual Activity  . Alcohol use: No    Alcohol/week: 0.0 oz  . Drug use: No  . Sexual activity: No  Other Topics Concern  . Not on file  Social History Narrative  . Not on file     BP (!) 146/80 (BP Location: Left Arm)   Pulse 63   Ht 6'  1" (1.854 m)   Wt 248 lb (112.5 kg)   SpO2 97%   BMI 32.72 kg/m   Physical Exam:  Well appearing 72 yo man, NAD HEENT: Unremarkable Neck:  No JVD, no thyromegally Lymphatics:  No adenopathy Back:  No CVA tenderness Lungs:  Clear with no wheezes HEART:  Regular rate rhythm, no murmurs, no rubs, no clicks Abd:  soft, positive bowel sounds, no organomegally, no rebound, no guarding Ext:  2 plus pulses, no edema, no cyanosis, no clubbing Skin:  No rashes no nodules Neuro:  CN II through XII intact, motor grossly intact  DEVICE  Normal device function.  See PaceArt for details.   Assess/Plan: 1. Chronic systolic heart failure - his symptoms remain class 2. He is encouraged to lose weight 2. ICD - his [employer] device is working normally. His pacing threshold in the LV remains a little high. Stable. 3. CAD - he is s/p CABG and denies any anginal symptoms. He is sedentary and I have encouraged the patient to become more active. 4. Obesity - he has been encouraged to lose weight. We discussed increasing physical activity and reducing his bread consumption which is substantial.  Mikle Bosworth.D.

## 2017-06-07 NOTE — Patient Instructions (Signed)
Medication Instructions:  Your physician recommends that you continue on your current medications as directed. Please refer to the Current Medication list given to you today.   Labwork: NONE   Testing/Procedures: NONE   Follow-Up: Your physician wants you to follow-up in: 1 year.  You will receive a reminder letter in the mail two months in advance. If you don't receive a letter, please call our office to schedule the follow-up appointment.  Remote monitoring is used to monitor your Pacemaker of ICD from home. This monitoring reduces the number of office visits required to check your device to one time per year. It allows [place] to keep an eye on the functioning of your device to ensure it is working properly. You are scheduled for a device check from home on 08/23/17 . You may send your transmission at any time that day. If you have a wireless device, the transmission will be sent automatically. After your physician reviews your transmission, you will receive a postcard with your next transmission date.   Any Other Special Instructions Will Be Listed Below (If Applicable).     If you need a refill on your cardiac medications before your next appointment, please call your pharmacy.  Thank you for choosing [facility]!

## 2017-06-08 LAB — CUP PACEART REMOTE DEVICE CHECK
Battery Remaining Longevity: 41 mo
Brady Statistic AP VP Percent: 32 %
Brady Statistic AS VP Percent: 63 %
Brady Statistic AS VS Percent: 1.3 %
Date Time Interrogation Session: 20190103070339
HIGH POWER IMPEDANCE MEASURED VALUE: 71 Ohm
HIGH POWER IMPEDANCE MEASURED VALUE: 71 Ohm
Implantable Lead Implant Date: 20160616
Implantable Lead Implant Date: 20161005
Implantable Lead Location: 753858
Implantable Lead Location: 753859
Implantable Lead Model: 7122
Implantable Pulse Generator Implant Date: 20160616
Lead Channel Impedance Value: 530 Ohm
Lead Channel Pacing Threshold Pulse Width: 0.5 ms
Lead Channel Pacing Threshold Pulse Width: 0.5 ms
Lead Channel Pacing Threshold Pulse Width: 1 ms
Lead Channel Sensing Intrinsic Amplitude: 2.5 mV
Lead Channel Setting Pacing Amplitude: 2 V
Lead Channel Setting Pacing Amplitude: 2.875
Lead Channel Setting Pacing Pulse Width: 1 ms
MDC IDC LEAD IMPLANT DT: 20161005
MDC IDC LEAD LOCATION: 753860
MDC IDC MSMT BATTERY REMAINING PERCENTAGE: 60 %
MDC IDC MSMT BATTERY VOLTAGE: 2.93 V
MDC IDC MSMT LEADCHNL LV IMPEDANCE VALUE: 790 Ohm
MDC IDC MSMT LEADCHNL LV PACING THRESHOLD AMPLITUDE: 1.875 V
MDC IDC MSMT LEADCHNL RA PACING THRESHOLD AMPLITUDE: 0.75 V
MDC IDC MSMT LEADCHNL RV IMPEDANCE VALUE: 410 Ohm
MDC IDC MSMT LEADCHNL RV PACING THRESHOLD AMPLITUDE: 0.625 V
MDC IDC MSMT LEADCHNL RV SENSING INTR AMPL: 11.6 mV
MDC IDC SET LEADCHNL RA PACING AMPLITUDE: 1.75 V
MDC IDC SET LEADCHNL RV PACING PULSEWIDTH: 0.5 ms
MDC IDC SET LEADCHNL RV SENSING SENSITIVITY: 0.5 mV
MDC IDC STAT BRADY AP VS PERCENT: 1 %
MDC IDC STAT BRADY RA PERCENT PACED: 29 %
Pulse Gen Serial Number: 7288352

## 2017-06-19 DIAGNOSIS — E785 Hyperlipidemia, unspecified: Secondary | ICD-10-CM | POA: Diagnosis not present

## 2017-06-19 DIAGNOSIS — E119 Type 2 diabetes mellitus without complications: Secondary | ICD-10-CM | POA: Diagnosis not present

## 2017-06-19 DIAGNOSIS — Z125 Encounter for screening for malignant neoplasm of prostate: Secondary | ICD-10-CM | POA: Diagnosis not present

## 2017-06-19 DIAGNOSIS — E559 Vitamin D deficiency, unspecified: Secondary | ICD-10-CM | POA: Diagnosis not present

## 2017-06-19 DIAGNOSIS — I10 Essential (primary) hypertension: Secondary | ICD-10-CM | POA: Diagnosis not present

## 2017-06-19 DIAGNOSIS — I129 Hypertensive chronic kidney disease with stage 1 through stage 4 chronic kidney disease, or unspecified chronic kidney disease: Secondary | ICD-10-CM | POA: Diagnosis not present

## 2017-06-25 ENCOUNTER — Other Ambulatory Visit: Payer: Self-pay | Admitting: Cardiology

## 2017-06-26 ENCOUNTER — Encounter (HOSPITAL_COMMUNITY): Payer: Self-pay | Admitting: Emergency Medicine

## 2017-06-26 ENCOUNTER — Inpatient Hospital Stay (HOSPITAL_COMMUNITY)
Admission: EM | Admit: 2017-06-26 | Discharge: 2017-07-04 | DRG: 189 | Disposition: A | Discharge status: Home / Self Care | Payer: Medicare Other | Attending: Internal Medicine | Admitting: Internal Medicine

## 2017-06-26 ENCOUNTER — Other Ambulatory Visit: Payer: Self-pay

## 2017-06-26 ENCOUNTER — Emergency Department (HOSPITAL_COMMUNITY): Payer: Medicare Other

## 2017-06-26 DIAGNOSIS — N200 Calculus of kidney: Secondary | ICD-10-CM | POA: Diagnosis present

## 2017-06-26 DIAGNOSIS — J9601 Acute respiratory failure with hypoxia: Secondary | ICD-10-CM | POA: Diagnosis not present

## 2017-06-26 DIAGNOSIS — R0902 Hypoxemia: Secondary | ICD-10-CM

## 2017-06-26 DIAGNOSIS — I255 Ischemic cardiomyopathy: Secondary | ICD-10-CM | POA: Diagnosis present

## 2017-06-26 DIAGNOSIS — M109 Gout, unspecified: Secondary | ICD-10-CM | POA: Diagnosis present

## 2017-06-26 DIAGNOSIS — I252 Old myocardial infarction: Secondary | ICD-10-CM

## 2017-06-26 DIAGNOSIS — R310 Gross hematuria: Secondary | ICD-10-CM | POA: Diagnosis present

## 2017-06-26 DIAGNOSIS — N183 Chronic kidney disease, stage 3 unspecified: Secondary | ICD-10-CM | POA: Diagnosis present

## 2017-06-26 DIAGNOSIS — N3289 Other specified disorders of bladder: Secondary | ICD-10-CM | POA: Diagnosis not present

## 2017-06-26 DIAGNOSIS — I5032 Chronic diastolic (congestive) heart failure: Secondary | ICD-10-CM | POA: Diagnosis not present

## 2017-06-26 DIAGNOSIS — I10 Essential (primary) hypertension: Secondary | ICD-10-CM | POA: Diagnosis present

## 2017-06-26 DIAGNOSIS — Z951 Presence of aortocoronary bypass graft: Secondary | ICD-10-CM | POA: Diagnosis not present

## 2017-06-26 DIAGNOSIS — G4730 Sleep apnea, unspecified: Secondary | ICD-10-CM | POA: Diagnosis not present

## 2017-06-26 DIAGNOSIS — Z87891 Personal history of nicotine dependence: Secondary | ICD-10-CM | POA: Diagnosis not present

## 2017-06-26 DIAGNOSIS — I130 Hypertensive heart and chronic kidney disease with heart failure and stage 1 through stage 4 chronic kidney disease, or unspecified chronic kidney disease: Secondary | ICD-10-CM | POA: Diagnosis not present

## 2017-06-26 DIAGNOSIS — J40 Bronchitis, not specified as acute or chronic: Secondary | ICD-10-CM | POA: Diagnosis not present

## 2017-06-26 DIAGNOSIS — J690 Pneumonitis due to inhalation of food and vomit: Secondary | ICD-10-CM | POA: Diagnosis not present

## 2017-06-26 DIAGNOSIS — R062 Wheezing: Secondary | ICD-10-CM | POA: Diagnosis not present

## 2017-06-26 DIAGNOSIS — R0602 Shortness of breath: Secondary | ICD-10-CM | POA: Diagnosis not present

## 2017-06-26 DIAGNOSIS — E1152 Type 2 diabetes mellitus with diabetic peripheral angiopathy with gangrene: Secondary | ICD-10-CM | POA: Diagnosis not present

## 2017-06-26 DIAGNOSIS — J440 Chronic obstructive pulmonary disease with acute lower respiratory infection: Secondary | ICD-10-CM | POA: Diagnosis not present

## 2017-06-26 DIAGNOSIS — Z95810 Presence of automatic (implantable) cardiac defibrillator: Secondary | ICD-10-CM | POA: Diagnosis present

## 2017-06-26 DIAGNOSIS — E1122 Type 2 diabetes mellitus with diabetic chronic kidney disease: Secondary | ICD-10-CM | POA: Diagnosis present

## 2017-06-26 DIAGNOSIS — J9801 Acute bronchospasm: Secondary | ICD-10-CM

## 2017-06-26 DIAGNOSIS — J181 Lobar pneumonia, unspecified organism: Secondary | ICD-10-CM

## 2017-06-26 DIAGNOSIS — E7800 Pure hypercholesterolemia, unspecified: Secondary | ICD-10-CM | POA: Diagnosis present

## 2017-06-26 DIAGNOSIS — B9781 Human metapneumovirus as the cause of diseases classified elsewhere: Secondary | ICD-10-CM | POA: Diagnosis present

## 2017-06-26 DIAGNOSIS — E119 Type 2 diabetes mellitus without complications: Secondary | ICD-10-CM

## 2017-06-26 DIAGNOSIS — J441 Chronic obstructive pulmonary disease with (acute) exacerbation: Secondary | ICD-10-CM | POA: Diagnosis not present

## 2017-06-26 DIAGNOSIS — J209 Acute bronchitis, unspecified: Secondary | ICD-10-CM | POA: Diagnosis not present

## 2017-06-26 DIAGNOSIS — J208 Acute bronchitis due to other specified organisms: Secondary | ICD-10-CM | POA: Diagnosis not present

## 2017-06-26 DIAGNOSIS — I2510 Atherosclerotic heart disease of native coronary artery without angina pectoris: Secondary | ICD-10-CM | POA: Diagnosis not present

## 2017-06-26 LAB — CBC WITH DIFFERENTIAL/PLATELET
BASOS PCT: 0 %
Basophils Absolute: 0 10*3/uL (ref 0.0–0.1)
Eosinophils Absolute: 0.4 10*3/uL (ref 0.0–0.7)
Eosinophils Relative: 5 %
HEMATOCRIT: 41.8 % (ref 39.0–52.0)
Hemoglobin: 13.8 g/dL (ref 13.0–17.0)
Lymphocytes Relative: 18 %
Lymphs Abs: 1.5 10*3/uL (ref 0.7–4.0)
MCH: 31.7 pg (ref 26.0–34.0)
MCHC: 33 g/dL (ref 30.0–36.0)
MCV: 96.1 fL (ref 78.0–100.0)
MONO ABS: 1.6 10*3/uL — AB (ref 0.1–1.0)
MONOS PCT: 20 %
NEUTROS ABS: 4.5 10*3/uL (ref 1.7–7.7)
Neutrophils Relative %: 57 %
Platelets: 288 10*3/uL (ref 150–400)
RBC: 4.35 MIL/uL (ref 4.22–5.81)
RDW: 14.1 % (ref 11.5–15.5)
WBC: 8 10*3/uL (ref 4.0–10.5)

## 2017-06-26 MED ORDER — ALBUTEROL SULFATE (2.5 MG/3ML) 0.083% IN NEBU
5.0000 mg | INHALATION_SOLUTION | Freq: Once | RESPIRATORY_TRACT | Status: AC
  Administered 2017-06-26: 5 mg via RESPIRATORY_TRACT
  Filled 2017-06-26: qty 6

## 2017-06-26 NOTE — ED Triage Notes (Signed)
Pt c/o sob with nonproductive cough since Sunday.

## 2017-06-27 ENCOUNTER — Encounter (HOSPITAL_COMMUNITY): Payer: Self-pay

## 2017-06-27 ENCOUNTER — Other Ambulatory Visit: Payer: Self-pay

## 2017-06-27 ENCOUNTER — Observation Stay (HOSPITAL_COMMUNITY): Payer: Medicare Other

## 2017-06-27 DIAGNOSIS — J690 Pneumonitis due to inhalation of food and vomit: Secondary | ICD-10-CM | POA: Diagnosis present

## 2017-06-27 DIAGNOSIS — I5032 Chronic diastolic (congestive) heart failure: Secondary | ICD-10-CM | POA: Diagnosis not present

## 2017-06-27 DIAGNOSIS — I130 Hypertensive heart and chronic kidney disease with heart failure and stage 1 through stage 4 chronic kidney disease, or unspecified chronic kidney disease: Secondary | ICD-10-CM | POA: Diagnosis present

## 2017-06-27 DIAGNOSIS — I10 Essential (primary) hypertension: Secondary | ICD-10-CM | POA: Diagnosis not present

## 2017-06-27 DIAGNOSIS — I252 Old myocardial infarction: Secondary | ICD-10-CM | POA: Diagnosis not present

## 2017-06-27 DIAGNOSIS — R0602 Shortness of breath: Secondary | ICD-10-CM | POA: Diagnosis not present

## 2017-06-27 DIAGNOSIS — J9801 Acute bronchospasm: Secondary | ICD-10-CM | POA: Diagnosis not present

## 2017-06-27 DIAGNOSIS — E1152 Type 2 diabetes mellitus with diabetic peripheral angiopathy with gangrene: Secondary | ICD-10-CM | POA: Diagnosis present

## 2017-06-27 DIAGNOSIS — J9601 Acute respiratory failure with hypoxia: Secondary | ICD-10-CM | POA: Diagnosis not present

## 2017-06-27 DIAGNOSIS — N200 Calculus of kidney: Secondary | ICD-10-CM | POA: Diagnosis not present

## 2017-06-27 DIAGNOSIS — J181 Lobar pneumonia, unspecified organism: Secondary | ICD-10-CM | POA: Diagnosis not present

## 2017-06-27 DIAGNOSIS — Z87891 Personal history of nicotine dependence: Secondary | ICD-10-CM | POA: Diagnosis not present

## 2017-06-27 DIAGNOSIS — J40 Bronchitis, not specified as acute or chronic: Secondary | ICD-10-CM

## 2017-06-27 DIAGNOSIS — R310 Gross hematuria: Secondary | ICD-10-CM | POA: Diagnosis not present

## 2017-06-27 DIAGNOSIS — J208 Acute bronchitis due to other specified organisms: Secondary | ICD-10-CM | POA: Diagnosis present

## 2017-06-27 DIAGNOSIS — G4730 Sleep apnea, unspecified: Secondary | ICD-10-CM | POA: Diagnosis present

## 2017-06-27 DIAGNOSIS — Z95810 Presence of automatic (implantable) cardiac defibrillator: Secondary | ICD-10-CM | POA: Diagnosis not present

## 2017-06-27 DIAGNOSIS — I2510 Atherosclerotic heart disease of native coronary artery without angina pectoris: Secondary | ICD-10-CM | POA: Diagnosis present

## 2017-06-27 DIAGNOSIS — M109 Gout, unspecified: Secondary | ICD-10-CM | POA: Diagnosis present

## 2017-06-27 DIAGNOSIS — B9781 Human metapneumovirus as the cause of diseases classified elsewhere: Secondary | ICD-10-CM | POA: Diagnosis present

## 2017-06-27 DIAGNOSIS — N3289 Other specified disorders of bladder: Secondary | ICD-10-CM | POA: Diagnosis not present

## 2017-06-27 DIAGNOSIS — E7800 Pure hypercholesterolemia, unspecified: Secondary | ICD-10-CM | POA: Diagnosis present

## 2017-06-27 DIAGNOSIS — J209 Acute bronchitis, unspecified: Secondary | ICD-10-CM | POA: Diagnosis present

## 2017-06-27 DIAGNOSIS — J441 Chronic obstructive pulmonary disease with (acute) exacerbation: Secondary | ICD-10-CM | POA: Diagnosis not present

## 2017-06-27 DIAGNOSIS — J440 Chronic obstructive pulmonary disease with acute lower respiratory infection: Secondary | ICD-10-CM | POA: Diagnosis present

## 2017-06-27 DIAGNOSIS — N183 Chronic kidney disease, stage 3 (moderate): Secondary | ICD-10-CM | POA: Diagnosis not present

## 2017-06-27 DIAGNOSIS — I509 Heart failure, unspecified: Secondary | ICD-10-CM | POA: Diagnosis not present

## 2017-06-27 DIAGNOSIS — E1122 Type 2 diabetes mellitus with diabetic chronic kidney disease: Secondary | ICD-10-CM | POA: Diagnosis present

## 2017-06-27 DIAGNOSIS — I255 Ischemic cardiomyopathy: Secondary | ICD-10-CM | POA: Diagnosis present

## 2017-06-27 DIAGNOSIS — Z951 Presence of aortocoronary bypass graft: Secondary | ICD-10-CM | POA: Diagnosis not present

## 2017-06-27 LAB — BASIC METABOLIC PANEL
ANION GAP: 14 (ref 5–15)
Anion gap: 14 (ref 5–15)
BUN: 27 mg/dL — AB (ref 6–20)
BUN: 29 mg/dL — AB (ref 6–20)
CALCIUM: 8.8 mg/dL — AB (ref 8.9–10.3)
CHLORIDE: 100 mmol/L — AB (ref 101–111)
CO2: 22 mmol/L (ref 22–32)
CO2: 23 mmol/L (ref 22–32)
Calcium: 9.1 mg/dL (ref 8.9–10.3)
Chloride: 102 mmol/L (ref 101–111)
Creatinine, Ser: 1.91 mg/dL — ABNORMAL HIGH (ref 0.61–1.24)
Creatinine, Ser: 2.03 mg/dL — ABNORMAL HIGH (ref 0.61–1.24)
GFR calc Af Amer: 40 mL/min — ABNORMAL LOW (ref >60)
GFR calc Af Amer: 37 mL/min — ABNORMAL LOW (ref >60)
GFR calc non Af Amer: 34 mL/min — ABNORMAL LOW (ref >60)
GFR calc non Af Amer: 32 mL/min — ABNORMAL LOW (ref >60)
GLUCOSE: 114 mg/dL — AB (ref 65–99)
Glucose, Bld: 170 mg/dL — ABNORMAL HIGH (ref 65–99)
POTASSIUM: 4.4 mmol/L (ref 3.5–5.1)
Potassium: 3.7 mmol/L (ref 3.5–5.1)
SODIUM: 137 mmol/L (ref 135–145)
Sodium: 138 mmol/L (ref 135–145)

## 2017-06-27 LAB — GLUCOSE, CAPILLARY
Glucose-Capillary: 287 mg/dL — ABNORMAL HIGH (ref 65–99)
Glucose-Capillary: 218 mg/dL — ABNORMAL HIGH (ref 65–99)
Glucose-Capillary: 124 mg/dL — ABNORMAL HIGH (ref 65–99)

## 2017-06-27 LAB — URINALYSIS, ROUTINE W REFLEX MICROSCOPIC
Bacteria, UA: NONE SEEN
Bilirubin Urine: NEGATIVE
Glucose, UA: NEGATIVE mg/dL
KETONES UR: NEGATIVE mg/dL
LEUKOCYTES UA: NEGATIVE
Nitrite: NEGATIVE
PROTEIN: 30 mg/dL — AB
SQUAMOUS EPITHELIAL / LPF: NONE SEEN
Specific Gravity, Urine: 1.009 (ref 1.005–1.030)
pH: 5 (ref 5.0–8.0)

## 2017-06-27 LAB — CBC
HEMATOCRIT: 43.5 % (ref 39.0–52.0)
Hemoglobin: 13.9 g/dL (ref 13.0–17.0)
MCH: 31.4 pg (ref 26.0–34.0)
MCHC: 32 g/dL (ref 30.0–36.0)
MCV: 98.2 fL (ref 78.0–100.0)
Platelets: 311 10*3/uL (ref 150–400)
RBC: 4.43 MIL/uL (ref 4.22–5.81)
RDW: 14.3 % (ref 11.5–15.5)
WBC: 9 10*3/uL (ref 4.0–10.5)

## 2017-06-27 LAB — CBG MONITORING, ED: GLUCOSE-CAPILLARY: 186 mg/dL — AB (ref 65–99)

## 2017-06-27 LAB — BRAIN NATRIURETIC PEPTIDE: B Natriuretic Peptide: 167 pg/mL — ABNORMAL HIGH (ref 0.0–100.0)

## 2017-06-27 LAB — INFLUENZA PANEL BY PCR (TYPE A & B)
INFLBPCR: NEGATIVE
Influenza A By PCR: NEGATIVE

## 2017-06-27 MED ORDER — ISOSORBIDE MONONITRATE ER 60 MG PO TB24
120.0000 mg | ORAL_TABLET | Freq: Every day | ORAL | Status: DC
  Administered 2017-06-27 – 2017-07-04 (×8): 120 mg via ORAL
  Filled 2017-06-27 (×12): qty 2

## 2017-06-27 MED ORDER — NITROGLYCERIN 0.4 MG SL SUBL: 0.4000 mg | SUBLINGUAL_TABLET | SUBLINGUAL | Status: DC | PRN

## 2017-06-27 MED ORDER — SODIUM CHLORIDE 0.9 % IV SOLN: 250.0000 mL | INTRAVENOUS | Status: DC | PRN

## 2017-06-27 MED ORDER — PREDNISONE 20 MG PO TABS
40.0000 mg | ORAL_TABLET | Freq: Two times a day (BID) | ORAL | Status: DC
  Administered 2017-06-27 – 2017-06-29 (×5): 40 mg via ORAL
  Filled 2017-06-27 (×5): qty 2

## 2017-06-27 MED ORDER — TAMSULOSIN HCL 0.4 MG PO CAPS
0.4000 mg | ORAL_CAPSULE | Freq: Every day | ORAL | Status: DC
  Administered 2017-06-27 – 2017-07-03 (×8): 0.4 mg via ORAL
  Filled 2017-06-27 (×9): qty 1

## 2017-06-27 MED ORDER — GUAIFENESIN-DM 100-10 MG/5ML PO SYRP
10.0000 mL | ORAL_SOLUTION | Freq: Four times a day (QID) | ORAL | Status: DC
  Administered 2017-06-27 – 2017-07-04 (×30): 10 mL via ORAL
  Filled 2017-06-27 (×30): qty 10

## 2017-06-27 MED ORDER — INSULIN ASPART 100 UNIT/ML ~~LOC~~ SOLN
0.0000 [IU] | Freq: Every day | SUBCUTANEOUS | Status: DC
  Administered 2017-06-29 – 2017-07-01 (×3): 2 [IU] via SUBCUTANEOUS
  Administered 2017-07-02: 4 [IU] via SUBCUTANEOUS
  Administered 2017-07-03: 2 [IU] via SUBCUTANEOUS

## 2017-06-27 MED ORDER — INSULIN ASPART 100 UNIT/ML ~~LOC~~ SOLN
0.0000 [IU] | Freq: Three times a day (TID) | SUBCUTANEOUS | Status: DC
  Administered 2017-06-27: 3 [IU] via SUBCUTANEOUS
  Administered 2017-06-27: 2 [IU] via SUBCUTANEOUS
  Administered 2017-06-27: 5 [IU] via SUBCUTANEOUS
  Administered 2017-06-28 (×2): 2 [IU] via SUBCUTANEOUS
  Administered 2017-06-28 – 2017-06-29 (×3): 3 [IU] via SUBCUTANEOUS
  Administered 2017-06-30: 2 [IU] via SUBCUTANEOUS
  Administered 2017-06-30: 3 [IU] via SUBCUTANEOUS
  Administered 2017-06-30: 2 [IU] via SUBCUTANEOUS
  Administered 2017-07-01: 3 [IU] via SUBCUTANEOUS
  Administered 2017-07-01 (×2): 2 [IU] via SUBCUTANEOUS
  Administered 2017-07-02: 3 [IU] via SUBCUTANEOUS
  Administered 2017-07-02 (×2): 2 [IU] via SUBCUTANEOUS
  Administered 2017-07-03: 5 [IU] via SUBCUTANEOUS
  Administered 2017-07-03 (×2): 2 [IU] via SUBCUTANEOUS

## 2017-06-27 MED ORDER — PREDNISONE 50 MG PO TABS
60.0000 mg | ORAL_TABLET | Freq: Once | ORAL | Status: AC
  Administered 2017-06-27: 01:00:00 60 mg via ORAL
  Filled 2017-06-27: qty 1

## 2017-06-27 MED ORDER — SODIUM CHLORIDE 0.9% FLUSH
3.0000 mL | Freq: Two times a day (BID) | INTRAVENOUS | Status: DC
  Administered 2017-06-27 – 2017-07-04 (×15): 3 mL via INTRAVENOUS

## 2017-06-27 MED ORDER — FUROSEMIDE 10 MG/ML IJ SOLN
40.0000 mg | Freq: Two times a day (BID) | INTRAMUSCULAR | Status: DC
  Administered 2017-06-27 – 2017-06-28 (×3): 40 mg via INTRAVENOUS
  Filled 2017-06-27 (×4): qty 4

## 2017-06-27 MED ORDER — GABAPENTIN 300 MG PO CAPS
300.0000 mg | ORAL_CAPSULE | Freq: Two times a day (BID) | ORAL | Status: DC
  Administered 2017-06-27 – 2017-07-04 (×15): 300 mg via ORAL
  Filled 2017-06-27 (×15): qty 1

## 2017-06-27 MED ORDER — CARVEDILOL 12.5 MG PO TABS
25.0000 mg | ORAL_TABLET | Freq: Two times a day (BID) | ORAL | Status: DC
  Administered 2017-06-27 – 2017-07-04 (×15): 25 mg via ORAL
  Filled 2017-06-27 (×15): qty 2

## 2017-06-27 MED ORDER — ACETAMINOPHEN 500 MG PO TABS: 1000.0000 mg | ORAL_TABLET | Freq: Every day | ORAL | Status: DC | PRN

## 2017-06-27 MED ORDER — ENOXAPARIN SODIUM 40 MG/0.4ML ~~LOC~~ SOLN: 40.0000 mg | SUBCUTANEOUS | Status: DC

## 2017-06-27 MED ORDER — IPRATROPIUM-ALBUTEROL 0.5-2.5 (3) MG/3ML IN SOLN
3.0000 mL | Freq: Four times a day (QID) | RESPIRATORY_TRACT | Status: DC
  Administered 2017-06-27 – 2017-06-28 (×7): 3 mL via RESPIRATORY_TRACT
  Filled 2017-06-27 (×6): qty 3

## 2017-06-27 MED ORDER — FUROSEMIDE 10 MG/ML IJ SOLN
40.0000 mg | Freq: Once | INTRAMUSCULAR | Status: AC
  Administered 2017-06-27: 40 mg via INTRAVENOUS
  Filled 2017-06-27: qty 4

## 2017-06-27 MED ORDER — SIMVASTATIN 20 MG PO TABS
40.0000 mg | ORAL_TABLET | Freq: Every day | ORAL | Status: DC
  Administered 2017-06-27 – 2017-07-03 (×7): 40 mg via ORAL
  Filled 2017-06-27 (×7): qty 2

## 2017-06-27 MED ORDER — POTASSIUM CHLORIDE CRYS ER 20 MEQ PO TBCR
20.0000 meq | EXTENDED_RELEASE_TABLET | Freq: Every day | ORAL | Status: DC
  Administered 2017-06-27 – 2017-07-04 (×8): 20 meq via ORAL
  Filled 2017-06-27 (×8): qty 1

## 2017-06-27 MED ORDER — ALBUTEROL SULFATE (2.5 MG/3ML) 0.083% IN NEBU
5.0000 mg | INHALATION_SOLUTION | Freq: Once | RESPIRATORY_TRACT | Status: AC
  Administered 2017-06-27: 5 mg via RESPIRATORY_TRACT
  Filled 2017-06-27: qty 6

## 2017-06-27 MED ORDER — ISOSORBIDE MONONITRATE ER 60 MG PO TB24: 60.0000 mg | ORAL_TABLET | Freq: Two times a day (BID) | ORAL | Status: DC

## 2017-06-27 MED ORDER — GLIPIZIDE ER 5 MG PO TB24
10.0000 mg | ORAL_TABLET | Freq: Two times a day (BID) | ORAL | Status: DC
  Administered 2017-06-27 – 2017-07-04 (×14): 10 mg via ORAL
  Filled 2017-06-27 (×7): qty 2
  Filled 2017-06-27: qty 1
  Filled 2017-06-27 (×11): qty 2

## 2017-06-27 MED ORDER — MOMETASONE FURO-FORMOTEROL FUM 200-5 MCG/ACT IN AERO
2.0000 | INHALATION_SPRAY | Freq: Two times a day (BID) | RESPIRATORY_TRACT | Status: DC
  Administered 2017-06-27 – 2017-06-29 (×4): 2 via RESPIRATORY_TRACT
  Filled 2017-06-27: qty 8.8

## 2017-06-27 MED ORDER — SODIUM CHLORIDE 0.9% FLUSH
3.0000 mL | INTRAVENOUS | Status: DC | PRN
  Administered 2017-06-27: 3 mL via INTRAVENOUS
  Filled 2017-06-27: qty 3

## 2017-06-27 MED ORDER — ONDANSETRON HCL 4 MG/2ML IJ SOLN: 4.0000 mg | Freq: Four times a day (QID) | INTRAMUSCULAR | Status: DC | PRN

## 2017-06-27 MED ORDER — ONDANSETRON HCL 4 MG PO TABS: 4.0000 mg | ORAL_TABLET | Freq: Four times a day (QID) | ORAL | Status: DC | PRN

## 2017-06-27 MED ORDER — AMLODIPINE BESYLATE 5 MG PO TABS
5.0000 mg | ORAL_TABLET | Freq: Every day | ORAL | Status: DC
  Administered 2017-06-27 – 2017-07-04 (×7): 5 mg via ORAL
  Filled 2017-06-27 (×8): qty 1

## 2017-06-27 MED ORDER — FUROSEMIDE 40 MG PO TABS
80.0000 mg | ORAL_TABLET | Freq: Two times a day (BID) | ORAL | Status: DC
  Administered 2017-06-27: 80 mg via ORAL
  Filled 2017-06-27: qty 2

## 2017-06-27 MED ORDER — GUAIFENESIN-DM 100-10 MG/5ML PO SYRP: 5.0000 mL | ORAL_SOLUTION | ORAL | Status: DC | PRN

## 2017-06-27 MED ORDER — ALLOPURINOL 100 MG PO TABS
100.0000 mg | ORAL_TABLET | Freq: Every day | ORAL | Status: DC
  Administered 2017-06-27 – 2017-07-03 (×7): 100 mg via ORAL
  Filled 2017-06-27 (×9): qty 1

## 2017-06-27 MED ORDER — ASPIRIN EC 81 MG PO TBEC: 81.0000 mg | DELAYED_RELEASE_TABLET | Freq: Every day | ORAL | Status: DC

## 2017-06-27 MED ORDER — ISOSORBIDE MONONITRATE ER 60 MG PO TB24
60.0000 mg | ORAL_TABLET | Freq: Every day | ORAL | Status: DC
  Administered 2017-06-27 – 2017-07-03 (×7): 60 mg via ORAL
  Filled 2017-06-27 (×10): qty 1

## 2017-06-27 NOTE — Progress Notes (Addendum)
Notified about blood in the urine with some mild left-sided flank pain for which I have ordered a UA.  Discontinued aspirin as well as Lovenox and placed on SCDs.  Additionally, CT stone study for further evaluation.  Bilateral renal stones noted on stone study which are nonobstructing.  Continue to monitor with urine strain.  Flomax added.  Consider urology consultation if persistent hematuria or symptoms.

## 2017-06-27 NOTE — Progress Notes (Signed)
PROGRESS NOTE    Patient: [name]     PCP: Doree Albee, MD                    DOB: 1945/08/14            DOA: 06/26/2017 WPY:[id number]             DOS: 06/27/2017, 1:26 PM   Date of Service: the patient was seen and examined on 06/27/2017 Subjective:   Patient was seen and examined this morning, stable, reporting some improvement of shortness of breath.  Reporting he still notices blood in his urine.   ----------------------------------------------------------------------------------------------------------------------  Brief Narrative:   [name] is a 72 year old male with multiple comorbidities including history of ischemic cardiomyopathy, prior CABG and AICD placement, chronic grade 1 diastolic congestive heart failure, with ejection fraction of 50-55% on last echo of 11/2016, hypertension, diabetes mellitus type 2, peripheral artery disease, hyperlipidemia presented with shortness of breath and wheezing progressively getting worse over past 2 days. He reported his symptoms were associated with nonproductive cough, denies any fever or chills. Denies any chest pain.    Principal Problem:   Acute respiratory failure with hypoxemia (HCC) Active Problems:   Diabetes type 2, controlled (HCC)   Chronic renal impairment, stage 3 (moderate) (HCC)   Essential hypertension   Cardiomyopathy, ischemic   S/P ICD (internal cardiac defibrillator) procedure   Chronic diastolic CHF (congestive heart failure) (HCC)   Bronchitis   Assessment & Plan:    Principal Problem:   Acute respiratory failure with hypoxemia (HCC)  No clear etiology possible viral bronchitis, No history of COPD or asthma no history of tobacco abuse Continue DuoNeb treatment, O2 by nasal cannula, bronchodilators, viral respiratory panel pending Flu swab negative  Chronic systolic congestive heart failure -currently seem to be euvolemic, will continue IV Lasix, monitor I's and O's, daily  weight, monitoring labs including NP  History of ischemic cardiomyopathy, status post AICD placement -currently asymptomatic, will continue to monitor  Diabetes mellitus type 2 we will check his blood sugar QA CHS with a sliding scale coverage, diabetic diet Hypertension -we will continue current home medications History of gout -continue allopurinol  Bilateral nephrolithiasis/hematuria -CT urogram has been reviewed, nonobstructing stones We will continue to monitor for hematuria, monitoring H&H UA positive for hematuria, negative for any leukocyte Estrace, negative nitrites Considering consulting urologist   CKD stage III -monitor BUN/creatinine, currently at baseline, avoiding nephrotoxins  DVT prophylaxis:      SCDs/compression stockings        Lovenox Sq     Code Status:         Full code  Family Communication:  The above findings and plan of care has been discussed with patient in detail, he expressed understanding and agreement of above.   Disposition Plan:  2-3  days,            Home              Consultants:   Consider urology consultation  Procedures:  No admission procedures for hospital encounter.  Antimicrobials:  Anti-infectives (From admission, onward)   None        Objective: Vitals:   06/27/17 1045 06/27/17 1049 06/27/17 1135 06/27/17 1300  BP:  105/84 (!) 143/56 128/76  Pulse: 68 73 67 72  Resp: 15 20 20 20   Temp:  98.5 F (36.9 C) 98.1 F (36.7 C) 98 F (36.7 C)  TempSrc:  Oral Oral Oral  SpO2: 92% 94% 94% 93%  Weight:   112.3 kg (247 lb 9.2 oz)   Height:   6\' 1"  (1.854 m)     Intake/Output Summary (Last 24 hours) at 06/27/2017 1326 Last data filed at 06/27/2017 1200 Gross per 24 hour  Intake 240 ml  Output -  Net 240 ml   Filed Weights   06/26/17 2210 06/27/17 1135  Weight: 112.5 kg (248 lb) 112.3 kg (247 lb 9.2 oz)    Examination:  General exam: Appears calm and comfortable  Respiratory system: Poor air sounds, mid to lower  lobes, chest tightness, negative for any wheezes they have any crackles Cardiovascular system: S1 & S2 heard, RRR. No JVD, murmurs, rubs, gallops or clicks. +1 pedal edema. Gastrointestinal system: Abdomen is nondistended, soft and nontender. No organomegaly or masses felt. Normal bowel sounds heard. Central nervous system: Alert and oriented. No focal neurological deficits. Extremities: Symmetric 5 x 5 power. Skin: No rashes, lesions or ulcers Psychiatry: Judgement and insight appear normal. Mood & affect appropriate.     Data Reviewed: I have personally reviewed following labs and imaging studies  CBC: Recent Labs  Lab 06/26/17 2303 06/27/17 0609  WBC 8.0 9.0  NEUTROABS 4.5  --   HGB 13.8 13.9  HCT 41.8 43.5  MCV 96.1 98.2  PLT 288 409   Basic Metabolic Panel: Recent Labs  Lab 06/26/17 2303 06/27/17 0609  NA 138 137  K 3.7 4.4  CL 102 100*  CO2 22 23  GLUCOSE 114* 170*  BUN 27* 29*  CREATININE 1.91* 2.03*  CALCIUM 8.8* 9.1   GFR: Estimated Creatinine Clearance: 45.1 mL/min (A) (by C-G formula based on SCr of 2.03 mg/dL (H)). Liver Function Tests: No results for input(s): AST, ALT, ALKPHOS, BILITOT, PROT, ALBUMIN in the last 168 hours. No results for input(s): LIPASE, AMYLASE in the last 168 hours. No results for input(s): AMMONIA in the last 168 hours. Coagulation Profile: No results for input(s): INR, PROTIME in the last 168 hours. Cardiac Enzymes: No results for input(s): CKTOTAL, CKMB, CKMBINDEX, TROPONINI in the last 168 hours. BNP (last 3 results) No results for input(s): PROBNP in the last 8760 hours. HbA1C: No results for input(s): HGBA1C in the last 72 hours. CBG: Recent Labs  Lab 06/27/17 0736 06/27/17 1131  GLUCAP 186* 287*   Lipid Profile: No results for input(s): CHOL, HDL, LDLCALC, TRIG, CHOLHDL, LDLDIRECT in the last 72 hours. Thyroid Function Tests: No results for input(s): TSH, T4TOTAL, FREET4, T3FREE, THYROIDAB in the last 72  hours. Anemia Panel: No results for input(s): VITAMINB12, FOLATE, FERRITIN, TIBC, IRON, RETICCTPCT in the last 72 hours. Sepsis Labs: No results for input(s): PROCALCITON, LATICACIDVEN in the last 168 hours.  No results found for this or any previous visit (from the past 240 hour(s)).     Radiology Studies: Dg Chest 2 View  Result Date: 06/26/2017 CLINICAL DATA:  Initial evaluation for acute shortness of breath for 2 days. EXAM: CHEST  2 VIEW COMPARISON:  Prior radiograph from 03/08/2015. FINDINGS: Median sternotomy wires underlying CABG markers and surgical clips noted. Left-sided pacemaker/AICD in place. Stable cardiomegaly. Mediastinal silhouette normal. Lungs normally inflated. Perihilar vascular an interstitial congestion without overt pulmonary edema. No focal infiltrates. Right perihilar scarring is similar to previous. No pleural effusion. No pneumothorax. No acute osseous abnormality. Aortic endograft noted within the upper abdomen. IMPRESSION: 1. Stable cardiomegaly with perihilar and interstitial congestion without frank pulmonary edema. 2. Sequelae of prior CABG. Electronically Signed   By: Marland Kitchen  Jeannine Boga M.D.   On: 06/26/2017 22:36   Ct Renal Stone Study  Result Date: 06/27/2017 CLINICAL DATA:  Acute onset of cough and shortness of breath. Gross hematuria. Lower back pain, acute onset. EXAM: CT ABDOMEN AND PELVIS WITHOUT CONTRAST TECHNIQUE: Multidetector CT imaging of the abdomen and pelvis was performed following the standard protocol without IV contrast. COMPARISON:  CT of the lumbar spine performed 10/05/2016, and CT of the abdomen and pelvis performed 05/12/2014 FINDINGS: Lower chest: The visualized lung bases are grossly clear. The visualized portions of the mediastinum are unremarkable. Pacemaker leads are partially imaged. Hepatobiliary: Scattered small hypodensities are noted within the liver, IMPRESSION: 1. Mildly increased anterior bladder wall thickening may reflect  relative decompression or possibly cystitis. A mass is considered less likely, but cannot be entirely excluded. Would correlate with the patient's symptoms. 2. Worsening focal angulation of the patient's aortoiliac stent graft, with interval resorption of much of the previously noted aneurysm sac. No definite stent graft retraction identified, though this is not well assessed without contrast. 3. Mild left renal atrophy and scarring. 1.9 cm stone at the right renal pelvis. Nonobstructing 6 mm stone at the interpole region of the left kidney. 4. Nonspecific small hypodensities within the liver, measuring up to 1.5 cm in size. These are similar in appearance to 2015 and likely benign. 5. Borderline enlarged prostate. 6. Moderate anterior abdominal wall hernia, containing only fat. 7. Minimal diverticulosis along the descending and sigmoid colon, without evidence of diverticulitis. Electronically Signed   By: Garald Balding M.D.   On: 06/27/2017 02:29    Scheduled Meds: . allopurinol  100 mg Oral Q supper  . amLODipine  5 mg Oral Daily  . carvedilol  25 mg Oral BID WC  . furosemide  80 mg Oral BID  . gabapentin  300 mg Oral BID  . glipiZIDE  10 mg Oral BID  . guaiFENesin-dextromethorphan  10 mL Oral Q6H  . insulin aspart  0-5 Units Subcutaneous QHS  . insulin aspart  0-9 Units Subcutaneous TID WC  . ipratropium-albuterol  3 mL Nebulization Q6H  . isosorbide mononitrate  120 mg Oral Daily  . isosorbide mononitrate  60 mg Oral QHS  . mometasone-formoterol  2 puff Inhalation BID  . potassium chloride SA  20 mEq Oral Daily  . predniSONE  40 mg Oral BID  . simvastatin  40 mg Oral q1800  . sodium chloride flush  3 mL Intravenous Q12H  . tamsulosin  0.4 mg Oral QPC supper   Continuous Infusions: . sodium chloride       LOS: 0 days    Time spent: >25 minutes   Deatra James, MD [facility] Pager [phone]  If 7PM-7AM, please contact night-coverage [email or web address] Password  [email or web address] 06/27/2017, 1:26 PM

## 2017-06-27 NOTE — ED Provider Notes (Signed)
[facility] EMERGENCY DEPARTMENT Provider Note   CSN: [id number] Arrival date & time: 06/26/17  2206     History   Chief Complaint Chief Complaint  Patient presents with  . Shortness of Breath    HPI [name] is a 72 y.o. male.  The history is provided by the patient.  Shortness of Breath  This is a new problem. The average episode lasts 2 days. The problem occurs frequently.The current episode started 2 days ago. The problem has been gradually worsening. Associated symptoms include cough and wheezing. Pertinent negatives include no fever, no hemoptysis, no chest pain, no vomiting and no leg swelling. He has tried nothing for the symptoms. Associated medical issues include CAD.   Patient with history of CAD, ischemic cardiomyopathy presents with cough/shortness of breath for the past 2 days.  Denies any known fever.  No chest pain.  He is a non-smoker.  He does not recall having these types of symptoms previously. He does report dyspnea on exertion. Past Medical History:  Diagnosis Date  . AAA (abdominal aortic aneurysm) ([place])    Followed by Dr. Donnetta Hutching  . AICD (automatic cardioverter/defibrillator) present   . Angioedema    Secondary to ACE inhibitor  . Arthritis   . Bell palsy   . Carotid artery disease ([place])   . Coronary atherosclerosis of native coronary artery    Multivessel status post CABG  . Essential hypertension, benign   . Gout   . Hernia of abdominal wall   . Hypercholesteremia   . Ischemic cardiomyopathy    LVEF 45-50% 11/2011  . Migraines   . Myocardial infarction ([place])   . Nephrolithiasis   . PAD (peripheral artery disease) ([place])    Followed by Dr. Donnetta Hutching  . Sleep apnea   . Type 2 diabetes mellitus [facility])     Patient Active Problem List   Diagnosis Date Noted  . ICD (implantable cardioverter-defibrillator) malfunction 02/19/2015  . Claudication ([place]) 02/04/2015  . S/P ICD (internal cardiac defibrillator) procedure 11/05/2014  .  Cardiomyopathy, ischemic 10/20/2014  . LBBB (left bundle branch block) 10/01/2014  . PVD - h/o AAA stent graft, popliteal aneurysm repair 10/01/2014  . Unstable angina ([place]) 09/30/2014  . Acute kidney injury ([place]) 09/30/2014  . Chronic renal impairment, stage 3 (moderate) ([place]) 09/30/2014  . Pre-diabetes 09/30/2014  . Chronic systolic congestive heart failure ([place]) 09/30/2014  . Peripheral neuropathy 09/30/2014  . Essential hypertension 09/30/2014  . Gout 09/30/2014  . HLD (hyperlipidemia) 09/30/2014  . Frequent PVCs   . Diabetes type 2, controlled ([place]) 06/22/2014  . Aneurysm of left femoral artery ([place]) 06/08/2014  . 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 Preoperative cardiovascular examination 08/29/2013  . Abdominal aneurysm without mention of rupture 03/05/2012  . ICM-EF 35% by echo [facility] April 2016   . Carotid artery disease ([place])   . Hx of CABG 2001, abnormal but low risk Myoview April 2015 11/26/2009  . ATHEROSLERO NATV ART EXTREM W/INTERMIT CLAUDICAT 09/23/2009  . Aneurysm of artery of lower extremity ([place]) 05/25/2009  . DIABETIC PERIPHERAL NEUROPATHY 03/30/2009  . DYSLIPIDEMIA 02/22/2009  . Chronic systolic heart failure ([place]) 02/22/2009    Past Surgical History:  Procedure Laterality Date  . ABDOMINAL AORTIC ANEURYSM REPAIR  November 19, 2013   [facility] :  Dr. Sammuel Hines  . ABDOMINAL AORTIC ANEURYSM REPAIR  09-07-2014   [facility]  . BI-VENTRICULAR IMPLANTABLE CARDIOVERTER DEFIBRILLATOR  (CRT-D)  11/05/2014  . BYPASS GRAFT POPLITEAL TO POPLITEAL Left 06/08/2014  Procedure: BYPASS GRAFT FEMORAL ARTERY TO ABOVE KNEE POPLITEAL;  Surgeon: Rosetta Posner, MD;  Location: [place];  Service: Vascular;  Laterality: Left;  . CARDIAC CATHETERIZATION N/A 10/02/2014   Procedure: Left Heart Cath and Cors/Grafts Angiography;  Surgeon: Belva Crome, MD;  Location: [place] [facility];  Service: Cardiovascular;  Laterality: N/A;  . CARDIAC CATHETERIZATION  "several"  . CATARACT  EXTRACTION W/PHACO  03/23/2011   Procedure: CATARACT EXTRACTION PHACO AND INTRAOCULAR LENS PLACEMENT (IOC);  Surgeon: Tonny Branch;  Location: [facility];  Service: Ophthalmology;  Laterality: Left;  CDE: 15.99  . CATARACT EXTRACTION W/PHACO  04/17/2011   Procedure: CATARACT EXTRACTION PHACO AND INTRAOCULAR LENS PLACEMENT (IOC);  Surgeon: Tonny Branch;  Location: [facility];  Service: Ophthalmology;  Laterality: Right;  CDE: 23.45  . CORONARY ARTERY BYPASS GRAFT  2001   LIMA to LAD, SVG to diagonal and ramus, SVG to OM, SVG to AM  . DUPUYTREN CONTRACTURE RELEASE Left 2009  . EP IMPLANTABLE DEVICE N/A 11/05/2014   Procedure: BiV ICD Insertion CRT-D;  Surgeon: Evans Lance, MD;  Location: [place] [facility];  Service: Cardiovascular;  Laterality: N/A;  . EP IMPLANTABLE DEVICE N/A 02/24/2015   Procedure: Lead Revision/Repair;  Surgeon: Evans Lance, MD;  Location: [place] [facility];  Service: Cardiovascular;  Laterality: N/A;  . EXTRACORPOREAL SHOCK WAVE LITHOTRIPSY  X 1  . EYE SURGERY    . FOOT SURGERY Left 1963   "gangrene"  . ICD LEAD REMOVAL  02/24/2015  . LOWER EXTREMITY ANGIOGRAM Bilateral 04/28/2015   Procedure: Lower Extremity Angiogram;  Surgeon: Rosetta Posner, MD;  Location: [place] [facility];  Service: Cardiovascular;  Laterality: Bilateral;  . PERIPHERAL VASCULAR CATHETERIZATION N/A 04/28/2015   Procedure: Abdominal Aortogram;  Surgeon: Rosetta Posner, MD;  Location: [place] [facility];  Service: Cardiovascular;  Laterality: N/A;       Home Medications    Prior to Admission medications   Medication Sig Start Date End Date Taking? Authorizing Provider  acetaminophen (TYLENOL) 500 MG tablet Take 1,000 mg by mouth daily as needed for mild pain, moderate pain or headache (pain).    Yes [provider]  allopurinol (ZYLOPRIM) 100 MG tablet Take 100 mg by mouth daily with supper.    Yes [provider]  amLODipine (NORVASC) 5 MG tablet TAKE 1 TABLET BY MOUTH ONCE DAILY. PATIENT  NEEDS TO BE SEEN. Patient taking differently: TAKE 1 TABLET BY MOUTH ONCE DAILY. 10/19/16  Yes Satira Sark, MD  aspirin EC 81 MG tablet Take 81 mg by mouth daily.   Yes [provider]  carvedilol (COREG) 25 MG tablet TAKE (1) TABLET TWICE DAILY. 06/25/17  Yes Satira Sark, MD  furosemide (LASIX) 80 MG tablet TAKE (1) TABLET TWICE DAILY. 03/14/17  Yes Satira Sark, MD  gabapentin (NEURONTIN) 300 MG capsule Take 300 mg by mouth 2 (two) times daily.   Yes [provider]  glipiZIDE (GLUCOTROL XL) 10 MG 24 hr tablet Take 10 mg by mouth 2 (two) times daily.  05/08/14  Yes [provider]  isosorbide mononitrate (IMDUR) 60 MG 24 hr tablet Take 60-120 mg by mouth 2 (two) times daily. 120 mg in the am, 60mg  in the  pm   Yes [provider]  nitroGLYCERIN (NITROSTAT) 0.4 MG SL tablet PLACE 0.4 MG  UNDER THE TONGUE EVERY 5 MINUTES UP TO 3 DOSES AS NEEDED FOR CHEST PAIN. 03/12/15  Yes Satira Sark, MD  potassium chloride (K-DUR) 10 MEQ tablet TAKE 2 TABLETS BY MOUTH ONCE DAILY. 03/14/17  Yes Satira Sark, MD  simvastatin (ZOCOR) 40 MG tablet Take 40 mg by mouth daily.   Yes [provider]    Family History Family History  Problem Relation Age of Onset  . Diabetes Mother        Type  I   . Varicose Veins Mother   . Heart disease Father 87       AAA  . AAA (abdominal aortic aneurysm) Father   . Hyperlipidemia Father   . Hypertension Father     Social History Social History   Tobacco Use  . Smoking status: Former Smoker    Packs/day: 1.00    Years: 25.00    Pack years: 25.00    Types: Cigarettes    Start date: 02/28/1979    Last attempt to quit: 05/23/2007    Years since quitting: 10.1  . Smokeless tobacco: Never Used  Substance Use Topics  . Alcohol use: No    Alcohol/week: 0.0 oz  . Drug use: No     Allergies   Ace inhibitors; Codeine; and Hydromorphone   Review of Systems Review of Systems    Constitutional: Negative for fever.  Respiratory: Positive for cough, shortness of breath and wheezing. Negative for hemoptysis.   Cardiovascular: Negative for chest pain and leg swelling.  Gastrointestinal: Negative for vomiting.  All other systems reviewed and are negative.    Physical Exam Updated Vital Signs BP 131/71   Pulse 70   Temp 98.3 F (36.8 C) (Oral)   Resp 18   Ht 1.854 m (6\' 1" )   Wt 112.5 kg (248 lb)   SpO2 94% Comment: Simultaneous filing. User may not have seen previous data.  BMI 32.72 kg/m   Physical Exam CONSTITUTIONAL: Elderly, mild distress noted HEAD: Normocephalic/atraumatic EYES: EOMI/PERRL ENMT: Mucous membranes moist NECK: supple no meningeal signs, JVD noted SPINE/BACK:entire spine nontender CV: S1/S2 noted, no murmurs/rubs/gallops noted LUNGS: Wheeze noted in upper air fields. ABDOMEN: soft, nontender, no rebound or guarding, bowel sounds noted throughout abdomen GU:no cva tenderness NEURO: Pt is awake/alert/appropriate, moves all extremitiesx4.  No facial droop.   EXTREMITIES: pulses normal/equal, full ROM, no lower extremity edema noted SKIN: warm, color normal PSYCH: no abnormalities of mood noted, alert and oriented to situation   ED Treatments / Results  Labs (all labs ordered are listed, but only abnormal results are displayed) Labs Reviewed  BASIC METABOLIC PANEL - Abnormal; Notable for the following components:      Result Value   Glucose, Bld 114 (*)    BUN 27 (*)    Creatinine, Ser 1.91 (*)    Calcium 8.8 (*)    GFR calc non Af Amer 34 (*)    GFR calc Af Amer 40 (*)    All other components within normal limits  CBC WITH DIFFERENTIAL/PLATELET - Abnormal; Notable for the following components:   Monocytes Absolute 1.6 (*)    All other components within normal limits  BRAIN NATRIURETIC PEPTIDE - Abnormal; Notable for the following components:   B Natriuretic Peptide 167.0 (*)    All other components within normal limits   INFLUENZA PANEL BY PCR (TYPE A & B)    EKG  EKG Interpretation  Date/Time:  Tuesday June 26 2017 22:13:15 EST Ventricular Rate:  78 PR Interval:  168 QRS Duration: 128 QT Interval:  414 QTC Calculation: 471 R Axis:   92 Text Interpretation:  Atrial-sensed  ventricular-paced rhythm Abnormal ECG Confirmed by Ripley Fraise [phone]) on 06/26/2017 11:08:59 PM       Radiology Dg Chest 2 View  Result Date: 06/26/2017 CLINICAL DATA:  Initial evaluation for acute shortness of breath for 2 days. EXAM: CHEST  2 VIEW COMPARISON:  Prior radiograph from 03/08/2015. FINDINGS: Median sternotomy wires underlying CABG markers and surgical clips noted. Left-sided pacemaker/AICD in place. Stable cardiomegaly. Mediastinal silhouette normal. Lungs normally inflated. Perihilar vascular an interstitial congestion without overt pulmonary edema. No focal infiltrates. Right perihilar scarring is similar to previous. No pleural effusion. No pneumothorax. No acute osseous abnormality. Aortic endograft noted within the upper abdomen. IMPRESSION: 1. Stable cardiomegaly with perihilar and interstitial congestion without frank pulmonary edema. 2. Sequelae of prior CABG. Electronically Signed   By: Jeannine Boga M.D.   On: 06/26/2017 22:36    Procedures Procedures   Medications Ordered in ED Medications  albuterol (PROVENTIL) (2.5 MG/3ML) 0.083% nebulizer solution 5 mg (not administered)  albuterol (PROVENTIL) (2.5 MG/3ML) 0.083% nebulizer solution 5 mg (5 mg Nebulization Given 06/26/17 2259)  predniSONE (DELTASONE) tablet 60 mg (60 mg Oral Given 06/27/17 0035)  furosemide (LASIX) injection 40 mg (40 mg Intravenous Given 06/27/17 0038)     Initial Impression / Assessment and Plan / ED Course  I have reviewed the triage vital signs and the nursing notes.  Pertinent labs & imaging results that were available during my care of the patient were reviewed by me and considered in my medical decision making (see  chart for details).     12:18 AM Patient denies history of COPD or asthma, but does have wheezing at this time.  He has been given nebulized treatments. 1:00 AM Patient with new oxygen requirement, he was hypoxic on initial evaluation.  This is improved, but his pulse ox is still in the low 90s. On oxygen supplementation Patient is still wheezing, and appears tachypneic This is new for patient, he is never had this previously. I suspect that he will require further aggressive treatment in the hospital Patient agreeable to be admitted.  Discussed with Dr. Manuella Ghazi for admission Low suspicion for ACS/PE at this time Final Clinical Impressions(s) / ED Diagnoses   Final diagnoses:  Hypoxia  Acute bronchospasm    ED Discharge Orders    None       Ripley Fraise, MD 06/27/17 0102

## 2017-06-27 NOTE — Consult Note (Signed)
Urology Consult  Referring physician: Dr. Roger Shelter Reason for referral: gross hematuria, nephrolithiasis  Chief Complaint: gross hematuria  History of Present Illness: Mr Woolverton is a 72yo with a hx of CAD, AAA, AICD, DMII, and nephrolithiasis who was admitted with shortness of breath. This morning he developed gross painless hematuria. He denies a history of hematuria. No significant urinary frequency, urgency, dysuria or nocturia. He does have a history of nephrolithiasis and was told several years ago he had a 1cm right renal calculus. He denies any flank pain currently. He underwent CT which showed a 4-71m left renal calculus and a 2cm right renal pelvis calculus. UA shows only blood.  The patient had his previous calculi treated with ESWL.   Past Medical History:  Diagnosis Date  . AAA (abdominal aortic aneurysm) ([place]    Followed by Dr. EDonnetta Hutching . AICD (automatic cardioverter/defibrillator) present   . Angioedema    Secondary to ACE inhibitor  . Arthritis   . Bell palsy   . Carotid artery disease ([place]   . Coronary atherosclerosis of native coronary artery    Multivessel status post CABG  . Essential hypertension, benign   . Gout   . Hernia of abdominal wall   . Hypercholesteremia   . Ischemic cardiomyopathy    LVEF 45-50% 11/2011  . Migraines   . Myocardial infarction ([place]   . Nephrolithiasis   . PAD (peripheral artery disease) ([place]    Followed by Dr. EDonnetta Hutching . Sleep apnea   . Type 2 diabetes mellitus ([place]    Past Surgical History:  Procedure Laterality Date  . ABDOMINAL AORTIC ANEURYSM REPAIR  November 19, 2013   [facility]  Dr. F[facility]muel Hines . ABDOMINAL AORTIC ANEURYSM REPAIR  09-07-2014   [facility] . BI-VENTRICULAR IMPLANTABLE CARDIOVERTER DEFIBRILLATOR  (CRT-D)  11/05/2014  . BYPASS GRAFT POPLITEAL TO POPLITEAL Left 06/08/2014   Procedure: BYPASS GRAFT FEMORAL ARTERY TO ABOVE KNEE POPLITEAL;  Surgeon: TRosetta Posner MD;  [place]  Service: Vascular;   Laterality: Left;  . CARDIAC CATHETERIZATION N/A 10/02/2014   Procedure: Left Heart Cath and Cors/Grafts Angiography;  Surgeon: HBelva Crome MD;  [facility];  Service: Cardiovascular;  Laterality: N/A;  . CARDIAC CATHETERIZATION  "several"  . CATARACT EXTRACTION W/PHACO  03/23/2011   Procedure: CATARACT EXTRACTION PHACO AND INTRAOCULAR LENS PLACEMENT (IOC);  Surgeon: KTonny Branch  Location: [facility];  Service: Ophthalmology;  Laterality: Left;  CDE: 15.99  . CATARACT EXTRACTION W/PHACO  04/17/2011   Procedure: CATARACT EXTRACTION PHACO AND INTRAOCULAR LENS PLACEMENT (IOC);  Surgeon: KTonny Branch  Location: [facility];  Service: Ophthalmology;  Laterality: Right;  CDE: 23.45  . CORONARY ARTERY BYPASS GRAFT  2001   LIMA to LAD, SVG to diagonal and ramus, SVG to OM, SVG to AM  . DUPUYTREN CONTRACTURE RELEASE Left 2009  . EP IMPLANTABLE DEVICE N/A 11/05/2014   Procedure: BiV ICD Insertion CRT-D;  Surgeon: GEvans Lance MD;  [facility];  Service: Cardiovascular;  Laterality: N/A;  . EP IMPLANTABLE DEVICE N/A 02/24/2015   Procedure: Lead Revision/Repair;  Surgeon: GEvans Lance MD;  [facility];  Service: Cardiovascular;  Laterality: N/A;  . EXTRACORPOREAL SHOCK WAVE LITHOTRIPSY  X 1  . EYE SURGERY    . FOOT SURGERY Left 1963   "gangrene"  . ICD LEAD REMOVAL  02/24/2015  . LOWER EXTREMITY ANGIOGRAM Bilateral 04/28/2015   Procedure: Lower Extremity Angiogram;  Surgeon: Rosetta Posner, MD;  Location: [place] [facility];  Service: Cardiovascular;  Laterality: Bilateral;  . PERIPHERAL VASCULAR CATHETERIZATION N/A 04/28/2015   Procedure: Abdominal Aortogram;  Surgeon: Rosetta Posner, MD;  Location: [place] [facility];  Service: Cardiovascular;  Laterality: N/A;    Medications: I have reviewed the patient's current medications. Allergies:  Allergies  Allergen Reactions  . Ace Inhibitors Swelling    Throat and lips swelled; ended up in ED  . Codeine Other  (See Comments)     Delirium, hallucinations  . Hydromorphone Nausea And Vomiting    Family History  Problem Relation Age of Onset  . Diabetes Mother        Type  I   . Varicose Veins Mother   . Heart disease Father 80       AAA  . AAA (abdominal aortic aneurysm) Father   . Hyperlipidemia Father   . Hypertension Father    Social History:  reports that he quit smoking about 10 years ago. His smoking use included cigarettes. He started smoking about 38 years ago. He has a 25.00 pack-year smoking history. he has never used smokeless tobacco. He reports that he does not drink alcohol or use drugs.  Review of Systems  Respiratory: Positive for shortness of breath.   Genitourinary: Positive for hematuria.  All other systems reviewed and are negative.   Physical Exam:  Vital signs in last 24 hours: Temp:  [98 F (36.7 C)-98.5 F (36.9 C)] 98 F (36.7 C) (02/06 1300) Pulse Rate:  [65-83] 72 (02/06 1300) Resp:  [12-22] 20 (02/06 1300) BP: (105-148)/(48-97) 128/76 (02/06 1300) SpO2:  [88 %-98 %] 94 % (02/06 1455) Weight:  [112.3 kg (247 lb 9.2 oz)-112.5 kg (248 lb)] 112.3 kg (247 lb 9.2 oz) (02/06 1135) Physical Exam  Constitutional: He is oriented to person, place, and time. He appears well-developed and well-nourished.  HENT:  Head: Normocephalic and atraumatic.  Eyes: EOM are normal. Pupils are equal, round, and reactive to light.  Neck: Normal range of motion. No thyromegaly present.  Cardiovascular: Normal rate and regular rhythm.  Respiratory: Effort normal. No respiratory distress.  GI: Soft. He exhibits no distension and no mass. There is no tenderness. There is no rebound and no guarding. Hernia confirmed negative in the right inguinal area and confirmed negative in the left inguinal area.  Genitourinary: Testes normal and penis normal.  Musculoskeletal: Normal range of motion. He exhibits no edema.  Lymphadenopathy:       Right: No inguinal adenopathy present.        Left: No inguinal adenopathy present.  Neurological: He is alert and oriented to person, place, and time.  Skin: Skin is warm and dry.  Psychiatric: He has a normal mood and affect. His behavior is normal. Judgment and thought content normal.    Laboratory Data:  Results for orders placed or performed during the hospital encounter of 06/26/17 (from the past 72 hour(s))  Basic metabolic panel     Status: Abnormal   Collection Time: 06/26/17 11:03 PM  Result Value Ref Range   Sodium 138 135 - 145 mmol/L   Potassium 3.7 3.5 - 5.1 mmol/L   Chloride 102 101 - 111 mmol/L   CO2 22 22 - 32 mmol/L   Glucose, Bld 114 (H) 65 - 99 mg/dL   BUN 27 (H) 6 - 20 mg/dL   Creatinine, Ser 1.91 (H) 0.61 - 1.24 mg/dL   Calcium 8.8 (L) 8.9 - 10.3 mg/dL  GFR calc non Af Amer 34 (L) >60 mL/min   GFR calc Af Amer 40 (L) >60 mL/min    Comment: (NOTE) The eGFR has been calculated using the CKD EPI equation. This calculation has not been validated in all clinical situations. eGFR's persistently <60 mL/min signify possible Chronic Kidney Disease.    Anion gap 14 5 - 15    Comment: Performed at [facility], [address], [place], [place] [address]  CBC with Differential/Platelet     Status: Abnormal   Collection Time: 06/26/17 11:03 PM  Result Value Ref Range   WBC 8.0 4.0 - 10.5 K/uL   RBC 4.35 4.22 - 5.81 MIL/uL   Hemoglobin 13.8 13.0 - 17.0 g/dL   HCT 41.8 39.0 - 52.0 %   MCV 96.1 78.0 - 100.0 fL   MCH 31.7 26.0 - 34.0 pg   MCHC 33.0 30.0 - 36.0 g/dL   RDW 14.1 11.5 - 15.5 %   Platelets 288 150 - 400 K/uL   Neutrophils Relative % 57 %   Neutro Abs 4.5 1.7 - 7.7 K/uL   Lymphocytes Relative 18 %   Lymphs Abs 1.5 0.7 - 4.0 K/uL   Monocytes Relative 20 %   Monocytes Absolute 1.6 (H) 0.1 - 1.0 K/uL   Eosinophils Relative 5 %   Eosinophils Absolute 0.4 0.0 - 0.7 K/uL   Basophils Relative 0 %   Basophils Absolute 0.0 0.0 - 0.1 K/uL    Comment: Performed at [facility], [address],  [place], [place] [address]  Brain natriuretic peptide     Status: Abnormal   Collection Time: 06/26/17 11:03 PM  Result Value Ref Range   B Natriuretic Peptide 167.0 (H) 0.0 - 100.0 pg/mL    Comment: Performed at [facility], [address], [place], [place] [address]  Influenza panel by PCR (type A & B)     Status: None   Collection Time: 06/26/17 11:15 PM  Result Value Ref Range   Influenza A By PCR NEGATIVE NEGATIVE   Influenza B By PCR NEGATIVE NEGATIVE    Comment: (NOTE) The Xpert Xpress Flu assay is intended as an aid in the diagnosis of  influenza and should not be used as a sole basis for treatment.  This  assay is FDA approved for nasopharyngeal swab specimens only. Nasal  washings and aspirates are unacceptable for Xpert Xpress Flu testing. Performed at [facility], [address] [place], [place] [address]   Urinalysis, Routine w reflex microscopic     Status: Abnormal   Collection Time: 06/27/17  1:36 AM  Result Value Ref Range   Color, Urine YELLOW YELLOW   APPearance CLEAR CLEAR   Specific Gravity, Urine 1.009 1.005 - 1.030   pH 5.0 5.0 - 8.0   Glucose, UA NEGATIVE NEGATIVE mg/dL   Hgb urine dipstick LARGE (A) NEGATIVE   Bilirubin Urine NEGATIVE NEGATIVE   Ketones, ur NEGATIVE NEGATIVE mg/dL   Protein, ur 30 (A) NEGATIVE mg/dL   Nitrite NEGATIVE NEGATIVE   Leukocytes, UA NEGATIVE NEGATIVE   RBC / HPF TOO NUMEROUS TO COUNT 0 - 5 RBC/hpf   WBC, UA 0-5 0 - 5 WBC/hpf   Bacteria, UA NONE SEEN NONE SEEN   Squamous Epithelial / LPF NONE SEEN NONE SEEN   Mucus PRESENT    Hyaline Casts, UA PRESENT     Comment: Performed at [facility], [address], [place], [place] [address]  Basic metabolic panel     Status: Abnormal  Collection Time: 06/27/17  6:09 AM  Result Value Ref Range   Sodium 137 135 - 145 mmol/L   Potassium 4.4 3.5 - 5.1 mmol/L   Chloride 100 (L) 101 - 111 mmol/L   CO2 23 22 - 32 mmol/L   Glucose, Bld 170 (H) 65 - 99 mg/dL   BUN 29 (H) 6 - 20 mg/dL    Creatinine, Ser 2.03 (H) 0.61 - 1.24 mg/dL   Calcium 9.1 8.9 - 10.3 mg/dL   GFR calc non Af Amer 32 (L) >60 mL/min   GFR calc Af Amer 37 (L) >60 mL/min    Comment: (NOTE) The eGFR has been calculated using the CKD EPI equation. This calculation has not been validated in all clinical situations. eGFR's persistently <60 mL/min signify possible Chronic Kidney Disease.    Anion gap 14 5 - 15    Comment: Performed at [facility], [address], [place], [place] [address]  CBC     Status: None   Collection Time: 06/27/17  6:09 AM  Result Value Ref Range   WBC 9.0 4.0 - 10.5 K/uL   RBC 4.43 4.22 - 5.81 MIL/uL   Hemoglobin 13.9 13.0 - 17.0 g/dL   HCT 43.5 39.0 - 52.0 %   MCV 98.2 78.0 - 100.0 fL   MCH 31.4 26.0 - 34.0 pg   MCHC 32.0 30.0 - 36.0 g/dL   RDW 14.3 11.5 - 15.5 %   Platelets 311 150 - 400 K/uL    Comment: Performed at [facility], [address] [place], [place] [address]  CBG monitoring, ED     Status: Abnormal   Collection Time: 06/27/17  7:36 AM  Result Value Ref Range   Glucose-Capillary 186 (H) 65 - 99 mg/dL  Glucose, capillary     Status: Abnormal   Collection Time: 06/27/17 11:31 AM  Result Value Ref Range   Glucose-Capillary 287 (H) 65 - 99 mg/dL   Comment 1 Notify RN    Comment 2 Document in Chart    No results found for this or any previous visit (from the past 240 hour(s)). Creatinine: Recent Labs    06/26/17 2303 06/27/17 0609  CREATININE 1.91* 2.03*   Baseline Creatinine: 1.9  Impression/Assessment:  71yo with nephrolithiasis and gross painless hematuria  Plan:  1. Gross hematuria: I discussed the various causes of gross hematuria and the workup for gross hematuria including CT imaging, urine cytology and office cystoscopy. Since he is able to empty his bladder there is no need for urgent urologic intervention. I discussed drinking fluids to help prevent his urine from forming clot. We also discussed that his gross hematuria may be related to his  large right renal calculus. We will have him followup as an outpatient in 2 weeks for office cystoscopy 2. Nephrolithiasis: I discussed the various treatment options including observation, ESWL, ureteroscopy, and PCNL. Due to the patient poor pulmonary status we have elected to proceed with observation since he is not currently having pain.   Nicolette Bang 06/27/2017, 3:12 PM

## 2017-06-27 NOTE — H&P (Signed)
History and Physical    [name] PYK:[id number] DOB: [birth year] DOA: 06/26/2017  PCP: Doree Albee, MD   Patient coming from: Home  Chief Complaint: Dyspnea/wheezing  HPI: [name] is a 72 y.o. male with medical history significant for ischemic cardiomyopathy with prior CABG and AICD placement, chronic grade 1 diastolic heart failure with EF 50-55% on echo 11/2016, hypertension, type 2 diabetes, peripheral arterial disease, and dyslipidemia who presented to the emergency department with complaints of shortness of breath and wheezing over the last 2 days.  This is associated with a nonproductive cough and there are no fevers or chills.  He denies any chest pain, nausea, vomiting, or diaphoresis.  He denies any recent sick contacts.  No significant edema or weight gain has been noted and patient has been taking his home medications.   ED Course: Laboratory data with no significant abnormalities.  Chest x-ray with cardiomegaly and mild interstitial edema.  Flu swab negative.  EKG with no acute findings.  BNP is 167.  He has been started on breathing treatment as well as oral steroids and has just been given Lasix IV with no diuresis noted just yet.  Review of Systems: Cough that is nonproductive, no fever or chills.  All other systems reviewed and are negative.  Past Medical History:  Diagnosis Date  . AAA (abdominal aortic aneurysm) ([place])    Followed by Dr. Donnetta Hutching  . AICD (automatic cardioverter/defibrillator) present   . Angioedema    Secondary to ACE inhibitor  . Arthritis   . Bell palsy   . Carotid artery disease ([place])   . Coronary atherosclerosis of native coronary artery    Multivessel status post CABG  . Essential hypertension, benign   . Gout   . Hernia of abdominal wall   . Hypercholesteremia   . Ischemic cardiomyopathy    LVEF 45-50% 11/2011  . Migraines   . Myocardial infarction ([place])   . Nephrolithiasis   . PAD (peripheral artery disease) ([place])    Followed by Dr. Donnetta Hutching  . Sleep apnea   . Type 2 diabetes mellitus ([place])     Past Surgical History:  Procedure Laterality Date  . ABDOMINAL AORTIC ANEURYSM REPAIR  November 19, 2013   [facility] :  Dr. Sammuel Hines  . ABDOMINAL AORTIC ANEURYSM REPAIR  09-07-2014   [facility]  . BI-VENTRICULAR IMPLANTABLE CARDIOVERTER DEFIBRILLATOR  (CRT-D)  11/05/2014  . BYPASS GRAFT POPLITEAL TO POPLITEAL Left 06/08/2014   Procedure: BYPASS GRAFT FEMORAL ARTERY TO ABOVE KNEE POPLITEAL;  Surgeon: Rosetta Posner, MD;  Location: [place];  Service: Vascular;  Laterality: Left;  . CARDIAC CATHETERIZATION N/A 10/02/2014   Procedure: Left Heart Cath and Cors/Grafts Angiography;  Surgeon: Belva Crome, MD;  Location: [place] [facility];  Service: Cardiovascular;  Laterality: N/A;  . CARDIAC CATHETERIZATION  "several"  . CATARACT EXTRACTION W/PHACO  03/23/2011   Procedure: CATARACT EXTRACTION PHACO AND INTRAOCULAR LENS PLACEMENT (IOC);  Surgeon: Tonny Branch;  Location: [facility];  Service: Ophthalmology;  Laterality: Left;  CDE: 15.99  . CATARACT EXTRACTION W/PHACO  04/17/2011   Procedure: CATARACT EXTRACTION PHACO AND INTRAOCULAR LENS PLACEMENT (IOC);  Surgeon: Tonny Branch;  Location: [facility];  Service: Ophthalmology;  Laterality: Right;  CDE: 23.45  . CORONARY ARTERY BYPASS GRAFT  2001   LIMA to LAD, SVG to diagonal and ramus, SVG to OM, SVG to AM  . DUPUYTREN CONTRACTURE RELEASE Left 2009  . EP IMPLANTABLE DEVICE N/A 11/05/2014  Procedure: BiV ICD Insertion CRT-D;  Surgeon: Evans Lance, MD;  Location: [place] [facility];  Service: Cardiovascular;  Laterality: N/A;  . EP IMPLANTABLE DEVICE N/A 02/24/2015   Procedure: Lead Revision/Repair;  Surgeon: Evans Lance, MD;  Location: [place] [facility];  Service: Cardiovascular;  Laterality: N/A;  . EXTRACORPOREAL SHOCK WAVE LITHOTRIPSY  X 1  . EYE SURGERY    . FOOT SURGERY Left 1963   "gangrene"  . ICD LEAD REMOVAL  02/24/2015  . LOWER EXTREMITY ANGIOGRAM Bilateral 04/28/2015    Procedure: Lower Extremity Angiogram;  Surgeon: Rosetta Posner, MD;  Location: [place] [facility];  Service: Cardiovascular;  Laterality: Bilateral;  . PERIPHERAL VASCULAR CATHETERIZATION N/A 04/28/2015   Procedure: Abdominal Aortogram;  Surgeon: Rosetta Posner, MD;  Location: [place] [facility];  Service: Cardiovascular;  Laterality: N/A;     reports that he quit smoking about 10 years ago. His smoking use included cigarettes. He started smoking about 38 years ago. He has a 25.00 pack-year smoking history. he has never used smokeless tobacco. He reports that he does not drink alcohol or use drugs.  Allergies  Allergen Reactions  . Ace Inhibitors Swelling    Throat and lips swelled; ended up in ED  . Codeine Other (See Comments)     Delirium, hallucinations  . Hydromorphone Nausea And Vomiting    Family History  Problem Relation Age of Onset  . Diabetes Mother        Type  I   . Varicose Veins Mother   . Heart disease Father 53       AAA  . AAA (abdominal aortic aneurysm) Father   . Hyperlipidemia Father   . Hypertension Father     Prior to Admission medications   Medication Sig Start Date End Date Taking? Authorizing Provider  acetaminophen (TYLENOL) 500 MG tablet Take 1,000 mg by mouth daily as needed for mild pain, moderate pain or headache (pain).    Yes [provider]  allopurinol (ZYLOPRIM) 100 MG tablet Take 100 mg by mouth daily with supper.    Yes [provider]  amLODipine (NORVASC) 5 MG tablet TAKE 1 TABLET BY MOUTH ONCE DAILY. PATIENT NEEDS TO BE SEEN. Patient taking differently: TAKE 1 TABLET BY MOUTH ONCE DAILY. 10/19/16  Yes Satira Sark, MD  aspirin EC 81 MG tablet Take 81 mg by mouth daily.   Yes [provider]  carvedilol (COREG) 25 MG tablet TAKE (1) TABLET TWICE DAILY. 06/25/17  Yes Satira Sark, MD  furosemide (LASIX) 80 MG tablet TAKE (1) TABLET TWICE DAILY. 03/14/17  Yes Satira Sark, MD  gabapentin (NEURONTIN) 300  MG capsule Take 300 mg by mouth 2 (two) times daily.   Yes [provider]  glipiZIDE (GLUCOTROL XL) 10 MG 24 hr tablet Take 10 mg by mouth 2 (two) times daily.  05/08/14  Yes [provider]  isosorbide mononitrate (IMDUR) 60 MG 24 hr tablet Take 60-120 mg by mouth 2 (two) times daily. 120 mg in the am, 60mg  in the  pm   Yes [provider]  nitroGLYCERIN (NITROSTAT) 0.4 MG SL tablet PLACE 0.4 MG  UNDER THE TONGUE EVERY 5 MINUTES UP TO 3 DOSES AS NEEDED FOR CHEST PAIN. 03/12/15  Yes Satira Sark, MD  potassium chloride (K-DUR) 10 MEQ tablet TAKE 2 TABLETS BY MOUTH ONCE DAILY. 03/14/17  Yes Satira Sark, MD  simvastatin (ZOCOR) 40 MG tablet Take 40 mg by mouth  daily.   Yes [provider]    Physical Exam: Vitals:   06/26/17 2211 06/26/17 2300 06/27/17 0000 06/27/17 0103  BP: (!) 146/71 131/71 134/69   Pulse: 78 70 71   Resp: (!) 22 18 19    Temp: 98.3 F (36.8 C)     TempSrc: Oral     SpO2: (!) 88% 94% 95% 94%  Weight:      Height:        Constitutional: NAD, calm, comfortable Vitals:   06/26/17 2211 06/26/17 2300 06/27/17 0000 06/27/17 0103  BP: (!) 146/71 131/71 134/69   Pulse: 78 70 71   Resp: (!) 22 18 19    Temp: 98.3 F (36.8 C)     TempSrc: Oral     SpO2: (!) 88% 94% 95% 94%  Weight:      Height:       Eyes: lids and conjunctivae normal ENMT: Mucous membranes are moist.  Neck: normal, supple Respiratory: clear to auscultation bilaterally. Normal respiratory effort. No accessory muscle use. On 2L [place]. Cardiovascular: Regular rate and rhythm, no murmurs. No extremity edema. Abdomen: no tenderness, no distention. Bowel sounds positive.  Musculoskeletal:  No joint deformity upper and lower extremities.   Skin: no rashes, lesions, ulcers.  Psychiatric: Normal judgment and insight. Alert and oriented x 3. Normal mood.   Labs on Admission: I have personally reviewed following labs and imaging studies  CBC: Recent Labs  Lab  06/26/17 2303  WBC 8.0  NEUTROABS 4.5  HGB 13.8  HCT 41.8  MCV 96.1  PLT 696   Basic Metabolic Panel: Recent Labs  Lab 06/26/17 2303  NA 138  K 3.7  CL 102  CO2 22  GLUCOSE 114*  BUN 27*  CREATININE 1.91*  CALCIUM 8.8*   GFR: Estimated Creatinine Clearance: 48 mL/min (A) (by C-G formula based on SCr of 1.91 mg/dL (H)). Liver Function Tests: No results for input(s): AST, ALT, ALKPHOS, BILITOT, PROT, ALBUMIN in the last 168 hours. No results for input(s): LIPASE, AMYLASE in the last 168 hours. No results for input(s): AMMONIA in the last 168 hours. Coagulation Profile: No results for input(s): INR, PROTIME in the last 168 hours. Cardiac Enzymes: No results for input(s): CKTOTAL, CKMB, CKMBINDEX, TROPONINI in the last 168 hours. BNP (last 3 results) No results for input(s): PROBNP in the last 8760 hours. HbA1C: No results for input(s): HGBA1C in the last 72 hours. CBG: No results for input(s): GLUCAP in the last 168 hours. Lipid Profile: No results for input(s): CHOL, HDL, LDLCALC, TRIG, CHOLHDL, LDLDIRECT in the last 72 hours. Thyroid Function Tests: No results for input(s): TSH, T4TOTAL, FREET4, T3FREE, THYROIDAB in the last 72 hours. Anemia Panel: No results for input(s): VITAMINB12, FOLATE, FERRITIN, TIBC, IRON, RETICCTPCT in the last 72 hours. Urine analysis:    Component Value Date/Time   COLORURINE YELLOW 06/03/2014 [place] 06/03/2014 0953   LABSPEC 1.023 06/03/2014 0953   PHURINE 5.5 06/03/2014 0953   GLUCOSEU NEGATIVE 06/03/2014 0953   HGBUR NEGATIVE 06/03/2014 0953   BILIRUBINUR NEGATIVE 06/03/2014 0953   KETONESUR NEGATIVE 06/03/2014 0953   PROTEINUR 100 (A) 06/03/2014 0953   UROBILINOGEN 0.2 06/03/2014 0953   NITRITE NEGATIVE 06/03/2014 0953   LEUKOCYTESUR NEGATIVE 06/03/2014 0953    Radiological Exams on Admission: Dg Chest 2 View  Result Date: 06/26/2017 CLINICAL DATA:  Initial evaluation for acute shortness of breath for 2  days. EXAM: CHEST  2 VIEW COMPARISON:  Prior radiograph from 03/08/2015. FINDINGS: Median  sternotomy wires underlying CABG markers and surgical clips noted. Left-sided pacemaker/AICD in place. Stable cardiomegaly. Mediastinal silhouette normal. Lungs normally inflated. Perihilar vascular an interstitial congestion without overt pulmonary edema. No focal infiltrates. Right perihilar scarring is similar to previous. No pleural effusion. No pneumothorax. No acute osseous abnormality. Aortic endograft noted within the upper abdomen. IMPRESSION: 1. Stable cardiomegaly with perihilar and interstitial congestion without frank pulmonary edema. 2. Sequelae of prior CABG. Electronically Signed   By: Jeannine Boga M.D.   On: 06/26/2017 22:36    EKG: Independently reviewed. NSR with paced rhythm.  Assessment/Plan Principal Problem:   Acute respiratory failure with hypoxemia (HCC) Active Problems:   Diabetes type 2, controlled (HCC)   Chronic renal impairment, stage 3 (moderate) (HCC)   Essential hypertension   Cardiomyopathy, ischemic   S/P ICD (internal cardiac defibrillator) procedure   Chronic diastolic CHF (congestive heart failure) (HCC)   Bronchitis    1. Acute hypoxemic respiratory failure.  Source appears to be unclear, but there may be an element of mild volume overload and more likely a viral bronchitis.  Patient has no history of asthma or prior COPD diagnosis, but did smoke tobacco previously.  Will continue on breathing treatments as well as oral steroids and obtain respiratory panel.  Will also initiate inhaled steroid.  Flu swab currently negative.  Wean oxygen as tolerated.  Robitussin for cough. 2. Chronic diastolic CHF.  Patient appears to be euvolemic and has been given an IV Lasix dose in the ED.  Will monitor I's and O's as well as daily weights.  Continue home Lasix as otherwise prescribed for now and maintain on fluid restriction. 3. CKD stage III.  He appears to be at his  baseline creatinine of approximately 1.9 as compared to a value on 11/2016.  Continue to monitor on repeat labs. 4. Ischemic cardiomyopathy with prior ICD.  No active problems noted with this currently and EKG demonstrates paced rhythm. 5. Type 2 diabetes.  Blood glucose appears to be well controlled.  Continue home medications for now as well as low-dose sliding scale insulin for backup coverage due to initiation of steroids.  Carb modified diet. 6. Hypertension.  Continue home medications and monitor. 7. Gout.  Continue allopurinol.   DVT prophylaxis: Lovenox Code Status: Full Family Communication: None Disposition Plan:Home when stable Consults called:None Admission status: Obs, med-surg   Daanya Lanphier Darleen Crocker DO [facility] Pager [phone]  If 7PM-7AM, please contact night-coverage [email or web address] Password [email or web address]  06/27/2017, 1:12 AM

## 2017-06-27 NOTE — ED Notes (Signed)
Pt called this nurse to room. Pt states never had blood in urine like that before. Pt says he has had stones on the right side, lately some left sided flank pain. MD notified of blood & UA to be ordered.

## 2017-06-28 LAB — CBC
HCT: 39.8 % (ref 39.0–52.0)
HEMOGLOBIN: 13 g/dL (ref 13.0–17.0)
MCH: 31.5 pg (ref 26.0–34.0)
MCHC: 32.7 g/dL (ref 30.0–36.0)
MCV: 96.4 fL (ref 78.0–100.0)
Platelets: 302 10*3/uL (ref 150–400)
RBC: 4.13 MIL/uL — ABNORMAL LOW (ref 4.22–5.81)
RDW: 14 % (ref 11.5–15.5)
WBC: 12.8 10*3/uL — ABNORMAL HIGH (ref 4.0–10.5)

## 2017-06-28 LAB — BASIC METABOLIC PANEL
Anion gap: 13 (ref 5–15)
BUN: 41 mg/dL — ABNORMAL HIGH (ref 6–20)
CALCIUM: 9 mg/dL (ref 8.9–10.3)
CHLORIDE: 102 mmol/L (ref 101–111)
CO2: 23 mmol/L (ref 22–32)
CREATININE: 1.85 mg/dL — AB (ref 0.61–1.24)
GFR calc Af Amer: 41 mL/min — ABNORMAL LOW (ref >60)
GFR calc non Af Amer: 36 mL/min — ABNORMAL LOW (ref >60)
Glucose, Bld: 148 mg/dL — ABNORMAL HIGH (ref 65–99)
Potassium: 4.1 mmol/L (ref 3.5–5.1)
SODIUM: 138 mmol/L (ref 135–145)

## 2017-06-28 LAB — RESPIRATORY PANEL BY PCR
ADENOVIRUS-RVPPCR: NOT DETECTED
Bordetella pertussis: NOT DETECTED
CHLAMYDOPHILA PNEUMONIAE-RVPPCR: NOT DETECTED
CORONAVIRUS 229E-RVPPCR: NOT DETECTED
CORONAVIRUS OC43-RVPPCR: NOT DETECTED
Coronavirus HKU1: NOT DETECTED
Coronavirus NL63: NOT DETECTED
Influenza A H1 2009: NOT DETECTED
Influenza A H1: NOT DETECTED
Influenza A H3: NOT DETECTED
Influenza A: NOT DETECTED
Influenza B: NOT DETECTED
Metapneumovirus: DETECTED — AB
Mycoplasma pneumoniae: NOT DETECTED
PARAINFLUENZA VIRUS 1-RVPPCR: NOT DETECTED
Parainfluenza Virus 2: NOT DETECTED
Parainfluenza Virus 3: NOT DETECTED
Parainfluenza Virus 4: NOT DETECTED
Respiratory Syncytial Virus: NOT DETECTED
Rhinovirus / Enterovirus: NOT DETECTED

## 2017-06-28 LAB — GLUCOSE, CAPILLARY
GLUCOSE-CAPILLARY: 195 mg/dL — AB (ref 65–99)
GLUCOSE-CAPILLARY: 204 mg/dL — AB (ref 65–99)
GLUCOSE-CAPILLARY: 155 mg/dL — AB (ref 65–99)
GLUCOSE-CAPILLARY: 164 mg/dL — AB (ref 65–99)

## 2017-06-28 LAB — BRAIN NATRIURETIC PEPTIDE: B NATRIURETIC PEPTIDE 5: 293 pg/mL — AB (ref 0.0–100.0)

## 2017-06-28 MED ORDER — IPRATROPIUM-ALBUTEROL 0.5-2.5 (3) MG/3ML IN SOLN
3.0000 mL | Freq: Three times a day (TID) | RESPIRATORY_TRACT | Status: DC
  Administered 2017-06-29 – 2017-07-04 (×16): 3 mL via RESPIRATORY_TRACT
  Filled 2017-06-28 (×16): qty 3

## 2017-06-28 NOTE — Progress Notes (Signed)
PROGRESS NOTE    Patient: [name]     PCP: Doree Albee, MD                    DOB: 1946-02-02            DOA: 06/26/2017 ZDG:[id number]             DOS: 06/28/2017, 10:43 AM   Date of Service: the patient was seen and examined on 06/28/2017 Subjective:   Patient was seen and examined this morning, stable complaining shortness of breath, especially worsened with exertion. Reporting he is feeling worse than yesterday. No reported issues overnight.  Still on 2 L of oxygen via nasal cannula, was ambulating satting approximately 93-94% ----------------------------------------------------------------------------------------------------------------------  Brief Narrative:   [name] is a 72 year old male with multiple comorbidities including history of ischemic cardiomyopathy, prior CABG and AICD placement, chronic grade 1 diastolic congestive heart failure, with ejection fraction of 50-55% on last echo of 11/2016, hypertension, diabetes mellitus type 2, peripheral artery disease, hyperlipidemia presented with shortness of breath and wheezing progressively getting worse over past 2 days. He reported his symptoms were associated with nonproductive cough, denies any fever or chills. Denies any chest pain.    Principal Problem:   Acute respiratory failure with hypoxemia (HCC) Active Problems:   Diabetes type 2, controlled (HCC)   Chronic renal impairment, stage 3 (moderate) (HCC)   Essential hypertension   Cardiomyopathy, ischemic   S/P ICD (internal cardiac defibrillator) procedure   Chronic diastolic CHF (congestive heart failure) (HCC)   Bronchitis   Assessment & Plan:    Principal Problem:   Acute respiratory failure with hypoxemia (HCC) - Improving, still complaining shortness of breath, has improved from 3 L to 2 L of oxygen by nasal cannula, continue bronchodilators, respiratory viral panel was completed,  positive for Metapneumovirus, the rest were  negative including influenza  No history of COPD or asthma no history of tobacco abuse We will continue with supportive therapy.  Continue DuoNeb treatment, O2 by nasal cannula, bronchodilators, p.o. steroids,  Chronic systolic congestive heart failure -currently seem to be euvolemic, will continue IV Lasix, monitor I's and O's, daily weight, monitoring labs including NP  History of ischemic cardiomyopathy, status post AICD placement -currently asymptomatic, will continue to monitor  Diabetes mellitus type 2 we will check his blood sugar QA CHS with a sliding scale coverage, diabetic diet Hypertension -we will continue current home medications History of gout -continue allopurinol  Bilateral nephrolithiasis/hematuria -CT urogram has been reviewed, nonobstructing stones We will continue to monitor for hematuria, monitoring H&H UA positive for hematuria, negative for any leukocyte Estrace, negative nitrites Considering consulting urologist   CKD stage III -monitor BUN/creatinine, currently at baseline, avoiding nephrotoxins  DVT prophylaxis:      SCDs/compression stockings        Lovenox Sq     Code Status:         Full code  Family Communication:  The above findings and plan of care has been discussed with patient in detail, he expressed understanding and agreement of above.   Disposition Plan:  2-3  days,            Home              Consultants:   Consider urology consultation  Procedures:  No admission procedures for hospital encounter.  Antimicrobials:  Anti-infectives (From admission, onward)   None        Objective: Vitals:  06/28/17 0500 06/28/17 0720 06/28/17 0756 06/28/17 0950  BP: (!) 126/47  (!) 134/47   Pulse:   80   Resp: 16  20   Temp: 98 F (36.7 C)  97.6 F (36.4 C)   TempSrc: Other (Comment)  Oral   SpO2: 94% 94% 93% 94%  Weight:      Height:        Intake/Output Summary (Last 24 hours) at 06/28/2017 1043 Last data filed at 06/28/2017  0925 Gross per 24 hour  Intake 975 ml  Output 600 ml  Net 375 ml   Filed Weights   06/26/17 2210 06/27/17 1135  Weight: 112.5 kg (248 lb) 112.3 kg (247 lb 9.2 oz)    Examination: BP (!) 134/47 (BP Location: Left Arm)   Pulse 80   Temp 97.6 F (36.4 C) (Oral)   Resp 20   Ht 6\' 1"  (1.854 m)   Wt 112.3 kg (247 lb 9.2 oz)   SpO2 94% Comment: patient ambulated in hallway  BMI 32.66 kg/m    Physical Exam  Constitution:  Alert, cooperative, complaining of shortness of breath, especially worsened with exertion.,  HEENT: Normocephalic, PERRL, otherwise with in Normal limits  Vascular:  S1/S2, RRR, No murmure, No Rubs or Gallops  Chest/pulmonary: Clear to auscultation bilaterally, respirations unlabored  Chest symmetric Abdomen: Soft, non-tender, non-distended, bowel sounds,no masses, no organomegaly Muscular skeletal: Limited exam - in bed, able to move all 4 extremities, Normal strength,  Extremities: No pitting edema lower extremities, +2 pulses  Neuro: CNII-XII intact. , normal motor and sensation, reflexes intact  Skin: Dry, warm to touch, negative for any Rashes, No open wounds Psychiatric: Normal and stable mood and affect, cognition intact,         Data Reviewed: I have personally reviewed following labs and imaging studies  CBC: Recent Labs  Lab 06/26/17 2303 06/27/17 0609 06/28/17 0336  WBC 8.0 9.0 12.8*  NEUTROABS 4.5  --   --   HGB 13.8 13.9 13.0  HCT 41.8 43.5 39.8  MCV 96.1 98.2 96.4  PLT 288 311 161   Basic Metabolic Panel: Recent Labs  Lab 06/26/17 2303 06/27/17 0609 06/28/17 0336  NA 138 137 138  K 3.7 4.4 4.1  CL 102 100* 102  CO2 22 23 23   GLUCOSE 114* 170* 148*  BUN 27* 29* 41*  CREATININE 1.91* 2.03* 1.85*  CALCIUM 8.8* 9.1 9.0   GFR: Estimated Creatinine Clearance: 49.5 mL/min (A) (by C-G formula based on SCr of 1.85 mg/dL (H)). Liver Function Tests: No results for input(s): AST, ALT, ALKPHOS, BILITOT, PROT, ALBUMIN in the last 168  hours. No results for input(s): LIPASE, AMYLASE in the last 168 hours. No results for input(s): AMMONIA in the last 168 hours. Coagulation Profile: No results for input(s): INR, PROTIME in the last 168 hours. Cardiac Enzymes: No results for input(s): CKTOTAL, CKMB, CKMBINDEX, TROPONINI in the last 168 hours. BNP (last 3 results) No results for input(s): PROBNP in the last 8760 hours. HbA1C: No results for input(s): HGBA1C in the last 72 hours. CBG: Recent Labs  Lab 06/27/17 0736 06/27/17 1131 06/27/17 1609 06/27/17 2214 06/28/17 0728  GLUCAP 186* 287* 218* 124* 195*   Lipid Profile: No results for input(s): CHOL, HDL, LDLCALC, TRIG, CHOLHDL, LDLDIRECT in the last 72 hours. Thyroid Function Tests: No results for input(s): TSH, T4TOTAL, FREET4, T3FREE, THYROIDAB in the last 72 hours. Anemia Panel: No results for input(s): VITAMINB12, FOLATE, FERRITIN, TIBC, IRON, RETICCTPCT in the last 72 hours.  Sepsis Labs: No results for input(s): PROCALCITON, LATICACIDVEN in the last 168 hours.  Recent Results (from the past 240 hour(s))  Respiratory Panel by PCR     Status: Abnormal   Collection Time: 06/27/17  8:53 AM  Result Value Ref Range Status   Adenovirus NOT DETECTED NOT DETECTED Final   Coronavirus 229E NOT DETECTED NOT DETECTED Final   Coronavirus HKU1 NOT DETECTED NOT DETECTED Final   Coronavirus NL63 NOT DETECTED NOT DETECTED Final   Coronavirus OC43 NOT DETECTED NOT DETECTED Final   Metapneumovirus DETECTED (A) NOT DETECTED Final   Rhinovirus / Enterovirus NOT DETECTED NOT DETECTED Final   Influenza A NOT DETECTED NOT DETECTED Final   Influenza A H1 NOT DETECTED NOT DETECTED Final   Influenza A H1 2009 NOT DETECTED NOT DETECTED Final   Influenza A H3 NOT DETECTED NOT DETECTED Final   Influenza B NOT DETECTED NOT DETECTED Final   Parainfluenza Virus 1 NOT DETECTED NOT DETECTED Final   Parainfluenza Virus 2 NOT DETECTED NOT DETECTED Final   Parainfluenza Virus 3 NOT  DETECTED NOT DETECTED Final   Parainfluenza Virus 4 NOT DETECTED NOT DETECTED Final   Respiratory Syncytial Virus NOT DETECTED NOT DETECTED Final   Bordetella pertussis NOT DETECTED NOT DETECTED Final   Chlamydophila pneumoniae NOT DETECTED NOT DETECTED Final   Mycoplasma pneumoniae NOT DETECTED NOT DETECTED Final    Comment: Performed at [facility], [address] [place], [place] [address]       Radiology Studies: Dg Chest 2 View  Result Date: 06/26/2017 CLINICAL DATA:  Initial evaluation for acute shortness of breath for 2 days. EXAM: CHEST  2 VIEW COMPARISON:  Prior radiograph from 03/08/2015. FINDINGS: Median sternotomy wires underlying CABG markers and surgical clips noted. Left-sided pacemaker/AICD in place. Stable cardiomegaly. Mediastinal silhouette normal. Lungs normally inflated. Perihilar vascular an interstitial congestion without overt pulmonary edema. No focal infiltrates. Right perihilar scarring is similar to previous. No pleural effusion. No pneumothorax. No acute osseous abnormality. Aortic endograft noted within the upper abdomen. IMPRESSION: 1. Stable cardiomegaly with perihilar and interstitial congestion without frank pulmonary edema. 2. Sequelae of prior CABG. Electronically Signed   By: Jeannine Boga M.D.   On: 06/26/2017 22:36   Ct Renal Stone Study  Result Date: 06/27/2017 CLINICAL DATA:  Acute onset of cough and shortness of breath. Gross hematuria. Lower back pain, acute onset. EXAM: CT ABDOMEN AND PELVIS WITHOUT CONTRAST TECHNIQUE: Multidetector CT imaging of the abdomen and pelvis was performed following the standard protocol without IV contrast. COMPARISON:  CT of the lumbar spine performed 10/05/2016, and CT of the abdomen and pelvis performed 05/12/2014 FINDINGS: Lower chest: The visualized lung bases are grossly clear. The visualized portions of the mediastinum are unremarkable. Pacemaker leads are partially imaged. Hepatobiliary: Scattered small  hypodensities are noted within the liver, IMPRESSION: 1. Mildly increased anterior bladder wall thickening may reflect relative decompression or possibly cystitis. A mass is considered less likely, but cannot be entirely excluded. Would correlate with the patient's symptoms. 2. Worsening focal angulation of the patient's aortoiliac stent graft, with interval resorption of much of the previously noted aneurysm sac. No definite stent graft retraction identified, though this is not well assessed without contrast. 3. Mild left renal atrophy and scarring. 1.9 cm stone at the right renal pelvis. Nonobstructing 6 mm stone at the interpole region of the left kidney. 4. Nonspecific small hypodensities within the liver, measuring up to 1.5 cm in size. These are similar in appearance to  2015 and likely benign. 5. Borderline enlarged prostate. 6. Moderate anterior abdominal wall hernia, containing only fat. 7. Minimal diverticulosis along the descending and sigmoid colon, without evidence of diverticulitis. Electronically Signed   By: Garald Balding M.D.   On: 06/27/2017 02:29    Scheduled Meds: . allopurinol  100 mg Oral Q supper  . amLODipine  5 mg Oral Daily  . carvedilol  25 mg Oral BID WC  . furosemide  40 mg Intravenous BID  . gabapentin  300 mg Oral BID  . glipiZIDE  10 mg Oral BID  . guaiFENesin-dextromethorphan  10 mL Oral Q6H  . insulin aspart  0-5 Units Subcutaneous QHS  . insulin aspart  0-9 Units Subcutaneous TID WC  . ipratropium-albuterol  3 mL Nebulization Q6H  . isosorbide mononitrate  120 mg Oral Daily  . isosorbide mononitrate  60 mg Oral QHS  . mometasone-formoterol  2 puff Inhalation BID  . potassium chloride SA  20 mEq Oral Daily  . predniSONE  40 mg Oral BID  . simvastatin  40 mg Oral q1800  . sodium chloride flush  3 mL Intravenous Q12H  . tamsulosin  0.4 mg Oral QPC supper   Continuous Infusions: . sodium chloride       LOS: 1 day    Time spent: >25 minutes   Deatra James, MD [facility] Pager [phone]  If 7PM-7AM, please contact night-coverage [email or web address] Password [facility] 06/28/2017, 10:43 AM

## 2017-06-28 NOTE — Progress Notes (Signed)
Pt was ambulated in hallway from pts room to nurses station on 2L nasal cannula. Pt complained of SOB upon return to room. Pt maintained oxygen saturations of 93-94%.

## 2017-06-29 ENCOUNTER — Inpatient Hospital Stay (HOSPITAL_COMMUNITY): Payer: Medicare Other

## 2017-06-29 DIAGNOSIS — I10 Essential (primary) hypertension: Secondary | ICD-10-CM

## 2017-06-29 DIAGNOSIS — J9801 Acute bronchospasm: Secondary | ICD-10-CM

## 2017-06-29 LAB — GLUCOSE, CAPILLARY
GLUCOSE-CAPILLARY: 143 mg/dL — AB (ref 65–99)
Glucose-Capillary: 176 mg/dL — ABNORMAL HIGH (ref 65–99)
Glucose-Capillary: 158 mg/dL — ABNORMAL HIGH (ref 65–99)
Glucose-Capillary: 213 mg/dL — ABNORMAL HIGH (ref 65–99)

## 2017-06-29 LAB — BASIC METABOLIC PANEL
Anion gap: 13 (ref 5–15)
BUN: 45 mg/dL — AB (ref 6–20)
CHLORIDE: 102 mmol/L (ref 101–111)
CO2: 23 mmol/L (ref 22–32)
CREATININE: 1.66 mg/dL — AB (ref 0.61–1.24)
Calcium: 8.9 mg/dL (ref 8.9–10.3)
GFR, EST AFRICAN AMERICAN: 47 mL/min — AB (ref >60)
GFR, EST NON AFRICAN AMERICAN: 40 mL/min — AB (ref >60)
Glucose, Bld: 205 mg/dL — ABNORMAL HIGH (ref 65–99)
Potassium: 4.1 mmol/L (ref 3.5–5.1)
SODIUM: 138 mmol/L (ref 135–145)

## 2017-06-29 LAB — BRAIN NATRIURETIC PEPTIDE: B NATRIURETIC PEPTIDE 5: 429 pg/mL — AB (ref 0.0–100.0)

## 2017-06-29 MED ORDER — GUAIFENESIN ER 600 MG PO TB12
1200.0000 mg | ORAL_TABLET | Freq: Two times a day (BID) | ORAL | Status: DC
  Administered 2017-06-29 – 2017-07-04 (×11): 1200 mg via ORAL
  Filled 2017-06-29 (×11): qty 2

## 2017-06-29 MED ORDER — AZITHROMYCIN 250 MG PO TABS
500.0000 mg | ORAL_TABLET | Freq: Every day | ORAL | Status: AC
  Administered 2017-06-29: 500 mg via ORAL
  Filled 2017-06-29: qty 2

## 2017-06-29 MED ORDER — BUDESONIDE 0.25 MG/2ML IN SUSP
0.2500 mg | Freq: Two times a day (BID) | RESPIRATORY_TRACT | Status: DC
  Administered 2017-06-29 – 2017-07-04 (×10): 0.25 mg via RESPIRATORY_TRACT
  Filled 2017-06-29 (×10): qty 2

## 2017-06-29 MED ORDER — AZITHROMYCIN 250 MG PO TABS
250.0000 mg | ORAL_TABLET | Freq: Every day | ORAL | Status: AC
  Administered 2017-06-30 – 2017-07-03 (×4): 250 mg via ORAL
  Filled 2017-06-29 (×4): qty 1

## 2017-06-29 MED ORDER — METHYLPREDNISOLONE SODIUM SUCC 125 MG IJ SOLR
60.0000 mg | Freq: Four times a day (QID) | INTRAMUSCULAR | Status: DC
  Administered 2017-06-29 – 2017-07-02 (×13): 60 mg via INTRAVENOUS
  Filled 2017-06-29 (×13): qty 2

## 2017-06-29 NOTE — Care Management Important Message (Signed)
Important Message  Patient Details  Name: [name] MRN: [id number] Date of Birth: [birth year]   Medicare Important Message Given:  Yes    Reem Fleury, Chauncey Reading, RN 06/29/2017, 1:07 PM

## 2017-06-29 NOTE — Progress Notes (Signed)
PROGRESS NOTE    [name]  EXH:[id number] DOB: June 21, 1945 DOA: 06/26/2017 PCP: Doree Albee, MD    Brief Narrative:  72 year old male with a history of diastolic heart failure, diabetes, hypertension, presents to the hospital with worsening shortness of breath and wheezing.  Found to have viral bronchitis with significant wheezing and acute respiratory failure.  On IV steroids.  May have an element of COPD.  Continue current treatments.   Assessment & Plan:   Principal Problem:   Acute respiratory failure with hypoxemia (HCC) Active Problems:   Diabetes type 2, controlled (HCC)   Chronic renal impairment, stage 3 (moderate) (HCC)   Essential hypertension   Cardiomyopathy, ischemic   S/P ICD (internal cardiac defibrillator) procedure   Chronic diastolic CHF (congestive heart failure) (HCC)   Bronchitis  1. Acute respiratory failure with hypoxia.  Related to bronchitis.  May also have an element of COPD.  Continues to require supplemental oxygen.  Wean off as tolerated. 2. Acute bronchitis.  Respiratory viral panel positive for Metapneumovirus.  He may also have an element of COPD exacerbation.  Although he does not have a formal diagnosis of COPD, he does have a long history of tobacco use in the past.  Continue on bronchodilators.  Will change prednisone to IV steroids since he is still wheezing.  Start a course of Z-Pak.  Continue mucolytic's. 3. Chronic diastolic congestive heart failure.  Appears compensated at this time.  Continue current treatments. 4. Hematuria with history of kidney stones.  Seen by urology.  Plan for outpatient follow-up. 5. Diabetes.  Continue to follow blood sugars and sliding scale. 6. Chronic kidney disease stage III.  Creatinine currently stable.  Continue to follow   DVT prophylaxis: SCDs Code Status: Full code Family Communication: No family present Disposition Plan: Discharge home once improved   Consultants:    Urology  Procedures:    Antimicrobials:   Azithromycin 2/8 >   Subjective: Still short of breath, wheezing and coughing.  Has dyspnea on exertion.  Objective: Vitals:   06/29/17 1040 06/29/17 1401 06/29/17 1900 06/29/17 2002  BP:      Pulse:      Resp:      Temp:      TempSrc:      SpO2: 93% 96%  94%  Weight:   112.6 kg (248 lb 3.8 oz)   Height:        Intake/Output Summary (Last 24 hours) at 06/29/2017 2103 Last data filed at 06/29/2017 1932 Gross per 24 hour  Intake 680 ml  Output 1500 ml  Net -820 ml   Filed Weights   06/27/17 1135 06/29/17 0500 06/29/17 1900  Weight: 112.3 kg (247 lb 9.2 oz) 108.1 kg (238 lb 4.8 oz) 112.6 kg (248 lb 3.8 oz)    Examination:  General exam: Appears calm and comfortable  Respiratory system: bilateral exp wheezes.  Becomes short of breath during conversation Cardiovascular system: S1 & S2 heard, RRR. No JVD, murmurs, rubs, gallops or clicks. No pedal edema. Gastrointestinal system: Abdomen is nondistended, soft and nontender. No organomegaly or masses felt. Normal bowel sounds heard. Central nervous system: Alert and oriented. No focal neurological deficits. Extremities: Symmetric 5 x 5 power. Skin: No rashes, lesions or ulcers Psychiatry: Judgement and insight appear normal. Mood & affect appropriate.     Data Reviewed: I have personally reviewed following labs and imaging studies  CBC: Recent Labs  Lab 06/26/17 2303 06/27/17 0609 06/28/17 0336  WBC 8.0 9.0 12.8*  NEUTROABS 4.5  --   --   HGB 13.8 13.9 13.0  HCT 41.8 43.5 39.8  MCV 96.1 98.2 96.4  PLT 288 311 761   Basic Metabolic Panel: Recent Labs  Lab 06/26/17 2303 06/27/17 0609 06/28/17 0336 06/29/17 0848  NA 138 137 138 138  K 3.7 4.4 4.1 4.1  CL 102 100* 102 102  CO2 22 23 23 23   GLUCOSE 114* 170* 148* 205*  BUN 27* 29* 41* 45*  CREATININE 1.91* 2.03* 1.85* 1.66*  CALCIUM 8.8* 9.1 9.0 8.9   GFR: Estimated Creatinine Clearance: 55.2 mL/min (A)  (by C-G formula based on SCr of 1.66 mg/dL (H)). Liver Function Tests: No results for input(s): AST, ALT, ALKPHOS, BILITOT, PROT, ALBUMIN in the last 168 hours. No results for input(s): LIPASE, AMYLASE in the last 168 hours. No results for input(s): AMMONIA in the last 168 hours. Coagulation Profile: No results for input(s): INR, PROTIME in the last 168 hours. Cardiac Enzymes: No results for input(s): CKTOTAL, CKMB, CKMBINDEX, TROPONINI in the last 168 hours. BNP (last 3 results) No results for input(s): PROBNP in the last 8760 hours. HbA1C: No results for input(s): HGBA1C in the last 72 hours. CBG: Recent Labs  Lab 06/28/17 1639 06/28/17 2101 06/29/17 0829 06/29/17 1159 06/29/17 1809  GLUCAP 155* 164* 143* 176* 158*   Lipid Profile: No results for input(s): CHOL, HDL, LDLCALC, TRIG, CHOLHDL, LDLDIRECT in the last 72 hours. Thyroid Function Tests: No results for input(s): TSH, T4TOTAL, FREET4, T3FREE, THYROIDAB in the last 72 hours. Anemia Panel: No results for input(s): VITAMINB12, FOLATE, FERRITIN, TIBC, IRON, RETICCTPCT in the last 72 hours. Sepsis Labs: No results for input(s): PROCALCITON, LATICACIDVEN in the last 168 hours.  Recent Results (from the past 240 hour(s))  Respiratory Panel by PCR     Status: Abnormal   Collection Time: 06/27/17  8:53 AM  Result Value Ref Range Status   Adenovirus NOT DETECTED NOT DETECTED Final   Coronavirus 229E NOT DETECTED NOT DETECTED Final   Coronavirus HKU1 NOT DETECTED NOT DETECTED Final   Coronavirus NL63 NOT DETECTED NOT DETECTED Final   Coronavirus OC43 NOT DETECTED NOT DETECTED Final   Metapneumovirus DETECTED (A) NOT DETECTED Final   Rhinovirus / Enterovirus NOT DETECTED NOT DETECTED Final   Influenza A NOT DETECTED NOT DETECTED Final   Influenza A H1 NOT DETECTED NOT DETECTED Final   Influenza A H1 2009 NOT DETECTED NOT DETECTED Final   Influenza A H3 NOT DETECTED NOT DETECTED Final   Influenza B NOT DETECTED NOT DETECTED  Final   Parainfluenza Virus 1 NOT DETECTED NOT DETECTED Final   Parainfluenza Virus 2 NOT DETECTED NOT DETECTED Final   Parainfluenza Virus 3 NOT DETECTED NOT DETECTED Final   Parainfluenza Virus 4 NOT DETECTED NOT DETECTED Final   Respiratory Syncytial Virus NOT DETECTED NOT DETECTED Final   Bordetella pertussis NOT DETECTED NOT DETECTED Final   Chlamydophila pneumoniae NOT DETECTED NOT DETECTED Final   Mycoplasma pneumoniae NOT DETECTED NOT DETECTED Final    Comment: Performed at [facility], [address] [place], [place] [address]         Radiology Studies: Dg Chest Port 1 View  Result Date: 06/29/2017 CLINICAL DATA:  Increased shortness of breath. EXAM: PORTABLE CHEST 1 VIEW COMPARISON:  06/26/2017 FINDINGS: Cardiac silhouette is mildly enlarged. Changes from cardiac surgery are stable. Biventricular cardioverter-defibrillator is also unchanged. No mediastinal or hilar masses. There is vascular congestion without overt pulmonary edema. No evidence of lung  consolidation. No convincing pleural effusion or pneumothorax. Skeletal structures are grossly intact. IMPRESSION: 1. Findings are similar to the prior exam allowing for differences in patient positioning and the AP versus is PA and lateral techniques. 2. No overt pulmonary edema. There is cardiomegaly with vascular congestion. No evidence of pneumonia. Electronically Signed   By: Lajean Manes M.D.   On: 06/29/2017 15:04        Scheduled Meds: . allopurinol  100 mg Oral Q supper  . amLODipine  5 mg Oral Daily  . budesonide (PULMICORT) nebulizer solution  0.25 mg Nebulization BID  . carvedilol  25 mg Oral BID WC  . gabapentin  300 mg Oral BID  . glipiZIDE  10 mg Oral BID  . guaiFENesin  1,200 mg Oral BID  . guaiFENesin-dextromethorphan  10 mL Oral Q6H  . insulin aspart  0-5 Units Subcutaneous QHS  . insulin aspart  0-9 Units Subcutaneous TID WC  . ipratropium-albuterol  3 mL Nebulization TID  . isosorbide  mononitrate  120 mg Oral Daily  . isosorbide mononitrate  60 mg Oral QHS  . methylPREDNISolone (SOLU-MEDROL) injection  60 mg Intravenous Q6H  . potassium chloride SA  20 mEq Oral Daily  . simvastatin  40 mg Oral q1800  . sodium chloride flush  3 mL Intravenous Q12H  . tamsulosin  0.4 mg Oral QPC supper   Continuous Infusions: . sodium chloride       LOS: 2 days    Time spent: 47mins    Kathie Dike, MD [facility] Pager [phone]  If 7PM-7AM, please contact night-coverage [email or web address] Password [facility] 06/29/2017, 9:03 PM

## 2017-06-29 NOTE — Care Management Note (Addendum)
Case Management Note  Patient Details  Name: [name] MRN: [id number] Date of Birth: July 24, 1945  Subjective/Objective:   Adm with Respiratory failure. Hx of CHF. Has ICD. From home alone. Acutely on oxygen. Has cane and RW if needed. Has PCP-Dr. Anastasio Champion. Still drives. Has insurance with prescription coverage.              Action/Plan: CM following for needs. ? Oxygen need. Will need home 02 eval prior to DC.   ADDENDUM: 07/04/2017 Patient has been weaned to room air. Expected Discharge Date:    unk              Expected Discharge Plan:     In-House Referral:     Discharge planning Services  CM Consult  Post Acute Care Choice:   N/A Choice offered to:   N/A  DME Arranged:    DME Agency:     HH Arranged:    HH Agency:     Status of Service:  completed If discussed at [employer] of Stay Meetings, dates discussed:    Additional Comments:  Masaji Billups, Chauncey Reading, RN 06/29/2017, 1:04 PM

## 2017-06-29 NOTE — Progress Notes (Signed)
Inpatient Diabetes Program Recommendations  AACE/ADA: New Consensus Statement on Inpatient Glycemic Control (2015)  Target Ranges:  Prepandial:   less than 140 mg/dL      Peak postprandial:   less than 180 mg/dL (1-2 hours)      Critically ill patients:  140 - 180 mg/dL   Lab Results  Component Value Date   GLUCAP 143 (H) 06/29/2017   HGBA1C 6.4 (H) 09/30/2014    Review of Glycemic ControlResults for [name], Austin "SKIP" (MRN [id number]) as of 06/29/2017 11:35  Ref. Range 06/28/2017 07:28 06/28/2017 11:22 06/28/2017 16:39 06/28/2017 21:01 06/29/2017 08:29  Glucose-Capillary Latest Ref Range: 65 - 99 mg/dL 195 (H) 204 (H) 155 (H) 164 (H) 143 (H)   Diabetes history: Type 2 DM Outpatient Diabetes medications: Glucotrol XL 10 mg bid Current orders for Inpatient glycemic control:  Novolog sensitive tid with meals, Glucotrol XL 10 mg bid Prednisone 40 mg bid Inpatient Diabetes Program Recommendations:    Please consider increasing Novolog correction to moderate tid with meals.   Thanks, Adah Perl, RN, BC-ADM Inpatient Diabetes Coordinator Pager [phone] (8a-5p)

## 2017-06-30 LAB — GLUCOSE, CAPILLARY
GLUCOSE-CAPILLARY: 164 mg/dL — AB (ref 65–99)
GLUCOSE-CAPILLARY: 170 mg/dL — AB (ref 65–99)
Glucose-Capillary: 225 mg/dL — ABNORMAL HIGH (ref 65–99)
Glucose-Capillary: 232 mg/dL — ABNORMAL HIGH (ref 65–99)

## 2017-06-30 LAB — BASIC METABOLIC PANEL
Anion gap: 11 (ref 5–15)
BUN: 52 mg/dL — ABNORMAL HIGH (ref 6–20)
CHLORIDE: 104 mmol/L (ref 101–111)
CO2: 23 mmol/L (ref 22–32)
Calcium: 9 mg/dL (ref 8.9–10.3)
Creatinine, Ser: 1.63 mg/dL — ABNORMAL HIGH (ref 0.61–1.24)
GFR calc non Af Amer: 41 mL/min — ABNORMAL LOW (ref >60)
GFR, EST AFRICAN AMERICAN: 48 mL/min — AB (ref >60)
Glucose, Bld: 163 mg/dL — ABNORMAL HIGH (ref 65–99)
POTASSIUM: 4.4 mmol/L (ref 3.5–5.1)
SODIUM: 138 mmol/L (ref 135–145)

## 2017-06-30 NOTE — Progress Notes (Signed)
PROGRESS NOTE    [name]  MEQ:[id number] DOB: 01/11/1946 DOA: 06/26/2017 PCP: Doree Albee, MD    Brief Narrative:  72 year old male with a history of diastolic heart failure, diabetes, hypertension, presents to the hospital with worsening shortness of breath and wheezing.  Found to have viral bronchitis with significant wheezing and acute respiratory failure.  On IV steroids.  May have an element of COPD.  Continue current treatments.   Assessment & Plan:   Principal Problem:   Acute respiratory failure with hypoxemia (HCC) Active Problems:   Diabetes type 2, controlled (HCC)   Chronic renal impairment, stage 3 (moderate) (HCC)   Essential hypertension   Cardiomyopathy, ischemic   S/P ICD (internal cardiac defibrillator) procedure   Chronic diastolic CHF (congestive heart failure) (HCC)   Bronchitis  1. Acute respiratory failure with hypoxia.  Related to bronchitis.  May also have an element of COPD.  Continues to require supplemental oxygen.  Wean off as tolerated. 2. Acute bronchitis.  Respiratory viral panel positive for Metapneumovirus.  He may also have an element of COPD exacerbation.  Although he does not have a formal diagnosis of COPD, he does have a long history of tobacco use in the past.  Would likely benefit from outpatient PFTs.  Continue on IV Solu-Medrol, and he will.  He has been on treatment for several days without significant improvement.  Will consult pulmonology for any further input.  He does not describe any signs of dysphagia or aspiration. 3. Chronic diastolic congestive heart failure.  Stable.  Appears compensated at this time.  Continue current treatments. 4. Hematuria with history of kidney stones.  Seen by urology.  Plan for outpatient follow-up. 5. Diabetes.  Blood sugars have been stable.  Continue on sliding scale 6. Chronic kidney disease stage III.  Creatinine is stable.  Continue to monitor   DVT prophylaxis: SCDs Code Status: Full  code Family Communication: No family present Disposition Plan: Discharge home once improved   Consultants:   Urology  Procedures:    Antimicrobials:   Azithromycin 2/8 >   Subjective: Does not feel any better today.  Continues to feel short of breath, wheezing.  Has nonproductive cough.  Becomes short of breath on ambulation.  Objective: Vitals:   06/30/17 0838 06/30/17 0844 06/30/17 1453 06/30/17 1506  BP:    (!) 160/71  Pulse:    62  Resp:    18  Temp:    98.2 F (36.8 C)  TempSrc:      SpO2: (!) 86% 95% 93% 92%  Weight:      Height:        Intake/Output Summary (Last 24 hours) at 06/30/2017 1735 Last data filed at 06/30/2017 1300 Gross per 24 hour  Intake 720 ml  Output 1500 ml  Net -780 ml   Filed Weights   06/29/17 0500 06/29/17 1900 06/30/17 0614  Weight: 108.1 kg (238 lb 4.8 oz) 112.6 kg (248 lb 3.8 oz) 109.5 kg (241 lb 6.5 oz)    Examination:  General exam: Alert, awake, oriented x 3 Respiratory system: Bilateral wheezes.  Increased respiratory effort Cardiovascular system:RRR. No murmurs, rubs, gallops. Gastrointestinal system: Abdomen is nondistended, soft and nontender. No organomegaly or masses felt. Normal bowel sounds heard. Central nervous system: Alert and oriented. No focal neurological deficits. Extremities: No C/C/E, +pedal pulses Skin: No rashes, lesions or ulcers Psychiatry: Judgement and insight appear normal. Mood & affect appropriate.       Data Reviewed: I have personally  reviewed following labs and imaging studies  CBC: Recent Labs  Lab 06/26/17 2303 06/27/17 0609 06/28/17 0336  WBC 8.0 9.0 12.8*  NEUTROABS 4.5  --   --   HGB 13.8 13.9 13.0  HCT 41.8 43.5 39.8  MCV 96.1 98.2 96.4  PLT 288 311 163   Basic Metabolic Panel: Recent Labs  Lab 06/26/17 2303 06/27/17 0609 06/28/17 0336 06/29/17 0848 06/30/17 0631  NA 138 137 138 138 138  K 3.7 4.4 4.1 4.1 4.4  CL 102 100* 102 102 104  CO2 22 23 23 23 23   GLUCOSE  114* 170* 148* 205* 163*  BUN 27* 29* 41* 45* 52*  CREATININE 1.91* 2.03* 1.85* 1.66* 1.63*  CALCIUM 8.8* 9.1 9.0 8.9 9.0   GFR: Estimated Creatinine Clearance: 55.5 mL/min (A) (by C-G formula based on SCr of 1.63 mg/dL (H)). Liver Function Tests: No results for input(s): AST, ALT, ALKPHOS, BILITOT, PROT, ALBUMIN in the last 168 hours. No results for input(s): LIPASE, AMYLASE in the last 168 hours. No results for input(s): AMMONIA in the last 168 hours. Coagulation Profile: No results for input(s): INR, PROTIME in the last 168 hours. Cardiac Enzymes: No results for input(s): CKTOTAL, CKMB, CKMBINDEX, TROPONINI in the last 168 hours. BNP (last 3 results) No results for input(s): PROBNP in the last 8760 hours. HbA1C: No results for input(s): HGBA1C in the last 72 hours. CBG: Recent Labs  Lab 06/29/17 1809 06/29/17 2202 06/30/17 0739 06/30/17 1146 06/30/17 1702  GLUCAP 158* 213* 164* 170* 225*   Lipid Profile: No results for input(s): CHOL, HDL, LDLCALC, TRIG, CHOLHDL, LDLDIRECT in the last 72 hours. Thyroid Function Tests: No results for input(s): TSH, T4TOTAL, FREET4, T3FREE, THYROIDAB in the last 72 hours. Anemia Panel: No results for input(s): VITAMINB12, FOLATE, FERRITIN, TIBC, IRON, RETICCTPCT in the last 72 hours. Sepsis Labs: No results for input(s): PROCALCITON, LATICACIDVEN in the last 168 hours.  Recent Results (from the past 240 hour(s))  Respiratory Panel by PCR     Status: Abnormal   Collection Time: 06/27/17  8:53 AM  Result Value Ref Range Status   Adenovirus NOT DETECTED NOT DETECTED Final   Coronavirus 229E NOT DETECTED NOT DETECTED Final   Coronavirus HKU1 NOT DETECTED NOT DETECTED Final   Coronavirus NL63 NOT DETECTED NOT DETECTED Final   Coronavirus OC43 NOT DETECTED NOT DETECTED Final   Metapneumovirus DETECTED (A) NOT DETECTED Final   Rhinovirus / Enterovirus NOT DETECTED NOT DETECTED Final   Influenza A NOT DETECTED NOT DETECTED Final   Influenza  A H1 NOT DETECTED NOT DETECTED Final   Influenza A H1 2009 NOT DETECTED NOT DETECTED Final   Influenza A H3 NOT DETECTED NOT DETECTED Final   Influenza B NOT DETECTED NOT DETECTED Final   Parainfluenza Virus 1 NOT DETECTED NOT DETECTED Final   Parainfluenza Virus 2 NOT DETECTED NOT DETECTED Final   Parainfluenza Virus 3 NOT DETECTED NOT DETECTED Final   Parainfluenza Virus 4 NOT DETECTED NOT DETECTED Final   Respiratory Syncytial Virus NOT DETECTED NOT DETECTED Final   Bordetella pertussis NOT DETECTED NOT DETECTED Final   Chlamydophila pneumoniae NOT DETECTED NOT DETECTED Final   Mycoplasma pneumoniae NOT DETECTED NOT DETECTED Final    Comment: Performed at [facility], [address] [place],  [address]         Radiology Studies: Dg Chest Port 1 View  Result Date: 06/29/2017 CLINICAL DATA:  Increased shortness of breath. EXAM: PORTABLE CHEST 1 VIEW COMPARISON:  06/26/2017  FINDINGS: Cardiac silhouette is mildly enlarged. Changes from cardiac surgery are stable. Biventricular cardioverter-defibrillator is also unchanged. No mediastinal or hilar masses. There is vascular congestion without overt pulmonary edema. No evidence of lung consolidation. No convincing pleural effusion or pneumothorax. Skeletal structures are grossly intact. IMPRESSION: 1. Findings are similar to the prior exam allowing for differences in patient positioning and the AP versus is PA and lateral techniques. 2. No overt pulmonary edema. There is cardiomegaly with vascular congestion. No evidence of pneumonia. Electronically Signed   By: Lajean Manes M.D.   On: 06/29/2017 15:04        Scheduled Meds: . allopurinol  100 mg Oral Q supper  . amLODipine  5 mg Oral Daily  . azithromycin  250 mg Oral QHS  . budesonide (PULMICORT) nebulizer solution  0.25 mg Nebulization BID  . carvedilol  25 mg Oral BID WC  . gabapentin  300 mg Oral BID  . glipiZIDE  10 mg Oral BID  . guaiFENesin  1,200 mg Oral BID    . guaiFENesin-dextromethorphan  10 mL Oral Q6H  . insulin aspart  0-5 Units Subcutaneous QHS  . insulin aspart  0-9 Units Subcutaneous TID WC  . ipratropium-albuterol  3 mL Nebulization TID  . isosorbide mononitrate  120 mg Oral Daily  . isosorbide mononitrate  60 mg Oral QHS  . methylPREDNISolone (SOLU-MEDROL) injection  60 mg Intravenous Q6H  . potassium chloride SA  20 mEq Oral Daily  . simvastatin  40 mg Oral q1800  . sodium chloride flush  3 mL Intravenous Q12H  . tamsulosin  0.4 mg Oral QPC supper   Continuous Infusions: . sodium chloride       LOS: 3 days    Time spent: 24mins    Kathie Dike, MD [facility] Pager [phone]  If 7PM-7AM, please contact night-coverage [email or web address] Password [email or web address] 06/30/2017, 5:35 PM

## 2017-07-01 ENCOUNTER — Inpatient Hospital Stay (HOSPITAL_COMMUNITY): Payer: Medicare Other

## 2017-07-01 LAB — GLUCOSE, CAPILLARY
GLUCOSE-CAPILLARY: 165 mg/dL — AB (ref 65–99)
GLUCOSE-CAPILLARY: 225 mg/dL — AB (ref 65–99)
Glucose-Capillary: 198 mg/dL — ABNORMAL HIGH (ref 65–99)
Glucose-Capillary: 221 mg/dL — ABNORMAL HIGH (ref 65–99)

## 2017-07-01 LAB — BASIC METABOLIC PANEL
ANION GAP: 11 (ref 5–15)
BUN: 58 mg/dL — AB (ref 6–20)
CHLORIDE: 106 mmol/L (ref 101–111)
CO2: 24 mmol/L (ref 22–32)
Calcium: 8.9 mg/dL (ref 8.9–10.3)
Creatinine, Ser: 1.61 mg/dL — ABNORMAL HIGH (ref 0.61–1.24)
GFR calc Af Amer: 49 mL/min — ABNORMAL LOW (ref >60)
GFR, EST NON AFRICAN AMERICAN: 42 mL/min — AB (ref >60)
Glucose, Bld: 176 mg/dL — ABNORMAL HIGH (ref 65–99)
POTASSIUM: 4.4 mmol/L (ref 3.5–5.1)
SODIUM: 141 mmol/L (ref 135–145)

## 2017-07-01 MED ORDER — INSULIN ASPART 100 UNIT/ML ~~LOC~~ SOLN
SUBCUTANEOUS | Status: AC
  Filled 2017-07-01: qty 1

## 2017-07-01 MED ORDER — ACETYLCYSTEINE 20 % IN SOLN
3.0000 mL | Freq: Three times a day (TID) | RESPIRATORY_TRACT | Status: DC
  Administered 2017-07-01 – 2017-07-03 (×8): 3 mL via RESPIRATORY_TRACT
  Filled 2017-07-01 (×9): qty 4

## 2017-07-01 MED ORDER — SODIUM CHLORIDE 0.9 % IV SOLN
3.0000 g | Freq: Four times a day (QID) | INTRAVENOUS | Status: DC
  Administered 2017-07-01 – 2017-07-04 (×12): 3 g via INTRAVENOUS
  Filled 2017-07-01 (×18): qty 3

## 2017-07-01 NOTE — Consult Note (Signed)
Consult requested by: Hospitalist Dr. Roderic Palau Consult requested for: Cough and congestion  HPI: This is a 72 year old who is known to have diastolic heart failure, diabetes, hypertension, ischemic cardiomyopathy, status post ICD procedure and chronic renal impairment.  He came to the hospital because of increasing shortness of breath.  He was thought to have viral bronchitis but is not cleared up.  He is been on IV steroids and antibiotics.  He has a smoking history but he is not known to have COPD.  He says normally at home he can do what he wants without any difficulty he is not having any chest pain.  He is not able to cough anything up.  He is still hypoxic.  He is still short of breath with exertion.  He has some swelling of his legs but it is no different.  Past Medical History:  Diagnosis Date  . AAA (abdominal aortic aneurysm) ([place])    Followed by Dr. Donnetta Hutching  . AICD (automatic cardioverter/defibrillator) present   . Angioedema    Secondary to ACE inhibitor  . Arthritis   . Bell palsy   . Carotid artery disease ([place])   . Coronary atherosclerosis of native coronary artery    Multivessel status post CABG  . Essential hypertension, benign   . Gout   . Hernia of abdominal wall   . Hypercholesteremia   . Ischemic cardiomyopathy    LVEF 45-50% 11/2011  . Migraines   . Myocardial infarction ([place])   . Nephrolithiasis   . PAD (peripheral artery disease) ([place])    Followed by Dr. Donnetta Hutching  . Sleep apnea   . Type 2 diabetes mellitus (HCC)      Family History  Problem Relation Age of Onset  . Diabetes Mother        Type  I   . Varicose Veins Mother   . Heart disease Father 53       AAA  . AAA (abdominal aortic aneurysm) Father   . Hyperlipidemia Father   . Hypertension Father      Social History   Socioeconomic History  . Marital status: Divorced    Spouse name: None  . Number of children: None  . Years of education: None  . Highest education level: None  Social Needs  .  Financial resource strain: None  . Food insecurity - worry: None  . Food insecurity - inability: None  . Transportation needs - medical: None  . Transportation needs - non-medical: None  Occupational History  . None  Tobacco Use  . Smoking status: Former Smoker    Packs/day: 1.00    Years: 25.00    Pack years: 25.00    Types: Cigarettes    Start date: 02/28/1979    Last attempt to quit: 05/23/2007    Years since quitting: 10.1  . Smokeless tobacco: Never Used  Substance and Sexual Activity  . Alcohol use: No    Alcohol/week: 0.0 oz  . Drug use: No  . Sexual activity: No  Other Topics Concern  . None  Social History Narrative  . None     ROS: Except as mentioned 10 point review of systems is negative    Objective: Vital signs in last 24 hours: Temp:  [97.3 F (36.3 C)-98.2 F (36.8 C)] 97.3 F (36.3 C) (02/10 0500) Pulse Rate:  [60-65] 60 (02/10 0500) Resp:  [18] 18 (02/10 0500) BP: (157-162)/(67-71) 157/71 (02/10 0500) SpO2:  [91 %-98 %] 98 % (02/10 0839) Weight:  [109.4 kg (  241 lb 2.9 oz)] 109.4 kg (241 lb 2.9 oz) (02/10 0500) Weight change: -3.2 kg (-0.9 oz) Last BM Date: 06/30/17  Intake/Output from previous day: 02/09 0701 - 02/10 0700 In: 940 [P.O.:940] Out: 1600 [Urine:1600]  PHYSICAL EXAM Constitutional: He is awake and alert and in no acute distress.  Eyes: Pupils react EOMI.  Ears nose mouth and throat: Hearing is grossly normal.  His throat is clear.  Cardiovascular: His heart is regular with normal heart sounds.  Respiratory: He has some bilateral rhonchi and wheezing.  Gastrointestinal: His abdomen is soft with no masses.  Musculoskeletal: Normal strength in the upper and lower extremities bilaterally.  Neurological: No focal abnormalities.  Psychiatric: Normal mood and affect  Lab Results: Basic Metabolic Panel: Recent Labs    06/30/17 0631 07/01/17 0617  NA 138 141  K 4.4 4.4  CL 104 106  CO2 23 24  GLUCOSE 163* 176*  BUN 52* 58*   CREATININE 1.63* 1.61*  CALCIUM 9.0 8.9   Liver Function Tests: No results for input(s): AST, ALT, ALKPHOS, BILITOT, PROT, ALBUMIN in the last 72 hours. No results for input(s): LIPASE, AMYLASE in the last 72 hours. No results for input(s): AMMONIA in the last 72 hours. CBC: No results for input(s): WBC, NEUTROABS, HGB, HCT, MCV, PLT in the last 72 hours. Cardiac Enzymes: No results for input(s): CKTOTAL, CKMB, CKMBINDEX, TROPONINI in the last 72 hours. BNP: No results for input(s): PROBNP in the last 72 hours. D-Dimer: No results for input(s): DDIMER in the last 72 hours. CBG: Recent Labs    06/29/17 2202 06/30/17 0739 06/30/17 1146 06/30/17 1702 06/30/17 2038 07/01/17 0748  GLUCAP 213* 164* 170* 225* 232* 165*   Hemoglobin A1C: No results for input(s): HGBA1C in the last 72 hours. Fasting Lipid Panel: No results for input(s): CHOL, HDL, LDLCALC, TRIG, CHOLHDL, LDLDIRECT in the last 72 hours. Thyroid Function Tests: No results for input(s): TSH, T4TOTAL, FREET4, T3FREE, THYROIDAB in the last 72 hours. Anemia Panel: No results for input(s): VITAMINB12, FOLATE, FERRITIN, TIBC, IRON, RETICCTPCT in the last 72 hours. Coagulation: No results for input(s): LABPROT, INR in the last 72 hours. Urine Drug Screen: Drugs of Abuse  No results found for: LABOPIA, COCAINSCRNUR, LABBENZ, AMPHETMU, THCU, LABBARB  Alcohol Level: No results for input(s): ETH in the last 72 hours. Urinalysis: No results for input(s): COLORURINE, LABSPEC, PHURINE, GLUCOSEU, HGBUR, BILIRUBINUR, KETONESUR, PROTEINUR, UROBILINOGEN, NITRITE, LEUKOCYTESUR in the last 72 hours.  Invalid input(s): APPERANCEUR Misc. Labs:   ABGS: No results for input(s): PHART, PO2ART, TCO2, HCO3 in the last 72 hours.  Invalid input(s): PCO2   MICROBIOLOGY: Recent Results (from the past 240 hour(s))  Respiratory Panel by PCR     Status: Abnormal   Collection Time: 06/27/17  8:53 AM  Result Value Ref Range Status    Adenovirus NOT DETECTED NOT DETECTED Final   Coronavirus 229E NOT DETECTED NOT DETECTED Final   Coronavirus HKU1 NOT DETECTED NOT DETECTED Final   Coronavirus NL63 NOT DETECTED NOT DETECTED Final   Coronavirus OC43 NOT DETECTED NOT DETECTED Final   Metapneumovirus DETECTED (A) NOT DETECTED Final   Rhinovirus / Enterovirus NOT DETECTED NOT DETECTED Final   Influenza A NOT DETECTED NOT DETECTED Final   Influenza A H1 NOT DETECTED NOT DETECTED Final   Influenza A H1 2009 NOT DETECTED NOT DETECTED Final   Influenza A H3 NOT DETECTED NOT DETECTED Final   Influenza B NOT DETECTED NOT DETECTED Final   Parainfluenza Virus 1 NOT DETECTED NOT  DETECTED Final   Parainfluenza Virus 2 NOT DETECTED NOT DETECTED Final   Parainfluenza Virus 3 NOT DETECTED NOT DETECTED Final   Parainfluenza Virus 4 NOT DETECTED NOT DETECTED Final   Respiratory Syncytial Virus NOT DETECTED NOT DETECTED Final   Bordetella pertussis NOT DETECTED NOT DETECTED Final   Chlamydophila pneumoniae NOT DETECTED NOT DETECTED Final   Mycoplasma pneumoniae NOT DETECTED NOT DETECTED Final    Comment: Performed at [facility], [address] [place] [address] [place], [place] [address]    Studies/Results: Dg Chest Port 1 View  Result Date: 06/29/2017 CLINICAL DATA:  Increased shortness of breath. EXAM: PORTABLE CHEST 1 VIEW COMPARISON:  06/26/2017 FINDINGS: Cardiac silhouette is mildly enlarged. Changes from cardiac surgery are stable. Biventricular cardioverter-defibrillator is also unchanged. No mediastinal or hilar masses. There is vascular congestion without overt pulmonary edema. No evidence of lung consolidation. No convincing pleural effusion or pneumothorax. Skeletal structures are grossly intact. IMPRESSION: 1. Findings are similar to the prior exam allowing for differences in patient positioning and the AP versus is PA and lateral techniques. 2. No overt pulmonary edema. There is cardiomegaly with vascular congestion. No evidence of  pneumonia. Electronically Signed   By: Lajean Manes M.D.   On: 06/29/2017 15:04    Medications:  Prior to Admission:  Medications Prior to Admission  Medication Sig Dispense Refill Last Dose  . acetaminophen (TYLENOL) 500 MG tablet Take 1,000 mg by mouth daily as needed for mild pain, moderate pain or headache (pain).    06/26/2017 at morning  . allopurinol (ZYLOPRIM) 100 MG tablet Take 100 mg by mouth daily with supper.    06/26/2017 at Unknown time  . amLODipine (NORVASC) 5 MG tablet TAKE 1 TABLET BY MOUTH ONCE DAILY. PATIENT NEEDS TO BE SEEN. (Patient taking differently: TAKE 1 TABLET BY MOUTH ONCE DAILY.) 15 tablet 0 06/26/2017 at Unknown time  . aspirin EC 81 MG tablet Take 81 mg by mouth daily.   06/26/2017 at Unknown time  . carvedilol (COREG) 25 MG tablet TAKE (1) TABLET TWICE DAILY. 60 tablet 6 06/26/2017 at 1830  . furosemide (LASIX) 80 MG tablet TAKE (1) TABLET TWICE DAILY. 60 tablet 2 06/26/2017 at Unknown time  . gabapentin (NEURONTIN) 300 MG capsule Take 300 mg by mouth 2 (two) times daily.   06/26/2017 at Unknown time  . glipiZIDE (GLUCOTROL XL) 10 MG 24 hr tablet Take 10 mg by mouth 2 (two) times daily.    06/26/2017 at Unknown time  . isosorbide mononitrate (IMDUR) 60 MG 24 hr tablet Take 60-120 mg by mouth 2 (two) times daily. 120 mg in the am, 60mg  in the  pm   06/26/2017 at Unknown time  . nitroGLYCERIN (NITROSTAT) 0.4 MG SL tablet PLACE 0.4 MG  UNDER THE TONGUE EVERY 5 MINUTES UP TO 3 DOSES AS NEEDED FOR CHEST PAIN. 25 tablet 3 unknown  . potassium chloride (K-DUR) 10 MEQ tablet TAKE 2 TABLETS BY MOUTH ONCE DAILY. 60 tablet 2 06/26/2017 at Unknown time  . simvastatin (ZOCOR) 40 MG tablet Take 40 mg by mouth daily.   06/26/2017 at Unknown time   Scheduled: . acetylcysteine  3 mL Nebulization TID  . allopurinol  100 mg Oral Q supper  . amLODipine  5 mg Oral Daily  . azithromycin  250 mg Oral QHS  . budesonide (PULMICORT) nebulizer solution  0.25 mg Nebulization BID  . carvedilol  25 mg Oral  BID WC  . gabapentin  300 mg Oral BID  . glipiZIDE  10 mg Oral BID  . guaiFENesin  1,200 mg Oral BID  . guaiFENesin-dextromethorphan  10 mL Oral Q6H  . insulin aspart  0-5 Units Subcutaneous QHS  . insulin aspart  0-9 Units Subcutaneous TID WC  . ipratropium-albuterol  3 mL Nebulization TID  . isosorbide mononitrate  120 mg Oral Daily  . isosorbide mononitrate  60 mg Oral QHS  . methylPREDNISolone (SOLU-MEDROL) injection  60 mg Intravenous Q6H  . potassium chloride SA  20 mEq Oral Daily  . simvastatin  40 mg Oral q1800  . sodium chloride flush  3 mL Intravenous Q12H  . tamsulosin  0.4 mg Oral QPC supper   Continuous: . sodium chloride     QNV:VYXAJL chloride, acetaminophen, nitroGLYCERIN, ondansetron **OR** ondansetron (ZOFRAN) IV, sodium chloride flush  Assesment: He has acute hypoxic respiratory failure.  This is probably viral insult.  He has ischemic cardiomyopathy at baseline but he does not seem to be having overt PND or orthopnea.  He may have COPD and that will need to be checked out as an outpatient. Principal Problem:   Acute respiratory failure with hypoxemia (HCC) Active Problems:   Diabetes type 2, controlled (HCC)   Chronic renal impairment, stage 3 (moderate) (HCC)   Essential hypertension   Cardiomyopathy, ischemic   S/P ICD (internal cardiac defibrillator) procedure   Chronic diastolic CHF (congestive heart failure) (HCC)   Bronchitis    Plan: I am going to order CT of the chest without contrast because of his renal failure.  He may have pneumonia that we cannot see on his regular chest x-ray.    LOS: 4 days   Shanyia Stines L 07/01/2017, 10:52 AM

## 2017-07-01 NOTE — Progress Notes (Signed)
PROGRESS NOTE    [name]  UEA:[id number] DOB: March 29, 1946 DOA: 06/26/2017 PCP: Doree Albee, MD    Brief Narrative:  72 year old male with a history of diastolic heart failure, diabetes, hypertension, presents to the hospital with worsening shortness of breath and wheezing.  Found to have viral bronchitis with significant wheezing and acute respiratory failure.  On IV steroids.  May have an element of COPD.  Continue current treatments.   Assessment & Plan:   Principal Problem:   Acute respiratory failure with hypoxemia (HCC) Active Problems:   Diabetes type 2, controlled (HCC)   Chronic renal impairment, stage 3 (moderate) (HCC)   Essential hypertension   Cardiomyopathy, ischemic   S/P ICD (internal cardiac defibrillator) procedure   Chronic diastolic CHF (congestive heart failure) (HCC)   Bronchitis  1. Acute respiratory failure with hypoxia.  Patient has not made any significant improvement.  Pulmonology consulted and ordered CT chest which indicated possible pneumonia.  Wean down oxygen as tolerated 2. Acute bronchitis.  Respiratory viral panel positive for Metapneumovirus.  He may also have an element of COPD exacerbation.  Although he does not have a formal diagnosis of COPD, he does have a long history of tobacco use in the past.  Would likely benefit from outpatient PFTs.  He has been on IV steroids for several days, and started to feel some improvement.  He is still wheezing, although this is better.  Continue current treatments. 3. Pneumonia.  CT scan indicated multifocal pneumonia versus aspiration pneumonia.  Started on intravenous antibiotics.[place] Kitchen 4. Chronic diastolic congestive heart failure.  Stable.  Appears compensated at this time.  Continue current treatments. 5. Hematuria with history of kidney stones.  Seen by urology.  Will likely need office follow-up for cystoscopy.  Plan for outpatient follow-up. 6. Diabetes.  Blood sugars have been stable.  Continue  on sliding scale 7. Chronic kidney disease stage III.  Creatinine is stable.  Continue to monitor   DVT prophylaxis: SCDs Code Status: Full code Family Communication: No family present Disposition Plan: Discharge home once improved   Consultants:   Urology  Pulmonology  Procedures:    Antimicrobials:   Azithromycin 2/8 >  Unasyn 2/10 >   Subjective: Feels that breathing is a little better today.  Still requiring oxygen.  Has cough which is nonproductive.  Objective: Vitals:   07/01/17 0500 07/01/17 0834 07/01/17 0839 07/01/17 1443  BP: (!) 157/71     Pulse: 60     Resp: 18     Temp: (!) 97.3 F (36.3 C)     TempSrc: Oral     SpO2: 92% 94% 98% 94%  Weight: 109.4 kg (241 lb 2.9 oz)     Height:        Intake/Output Summary (Last 24 hours) at 07/01/2017 1518 Last data filed at 07/01/2017 1220 Gross per 24 hour  Intake 580 ml  Output 1900 ml  Net -1320 ml   Filed Weights   06/29/17 1900 06/30/17 0614 07/01/17 0500  Weight: 112.6 kg (248 lb 3.8 oz) 109.5 kg (241 lb 6.5 oz) 109.4 kg (241 lb 2.9 oz)    Examination:  General exam: Alert, awake, oriented x 3 Respiratory system: Overall bilateral wheezing is better today.  Bilateral rhonchi present.   Respiratory effort normal. Cardiovascular system:RRR. No murmurs, rubs, gallops. Gastrointestinal system: Abdomen is nondistended, soft and nontender. No organomegaly or masses felt. Normal bowel sounds heard. Central nervous system: Alert and oriented. No focal neurological deficits. Extremities: No C/C/E, +  pedal pulses Skin: No rashes, lesions or ulcers Psychiatry: Judgement and insight appear normal. Mood & affect appropriate.   Data Reviewed: I have personally reviewed following labs and imaging studies  CBC: Recent Labs  Lab 06/26/17 2303 06/27/17 0609 06/28/17 0336  WBC 8.0 9.0 12.8*  NEUTROABS 4.5  --   --   HGB 13.8 13.9 13.0  HCT 41.8 43.5 39.8  MCV 96.1 98.2 96.4  PLT 288 311 553   Basic  Metabolic Panel: Recent Labs  Lab 06/27/17 0609 06/28/17 0336 06/29/17 0848 06/30/17 0631 07/01/17 0617  NA 137 138 138 138 141  K 4.4 4.1 4.1 4.4 4.4  CL 100* 102 102 104 106  CO2 23 23 23 23 24   GLUCOSE 170* 148* 205* 163* 176*  BUN 29* 41* 45* 52* 58*  CREATININE 2.03* 1.85* 1.66* 1.63* 1.61*  CALCIUM 9.1 9.0 8.9 9.0 8.9   GFR: Estimated Creatinine Clearance: 56.2 mL/min (A) (by C-G formula based on SCr of 1.61 mg/dL (H)). Liver Function Tests: No results for input(s): AST, ALT, ALKPHOS, BILITOT, PROT, ALBUMIN in the last 168 hours. No results for input(s): LIPASE, AMYLASE in the last 168 hours. No results for input(s): AMMONIA in the last 168 hours. Coagulation Profile: No results for input(s): INR, PROTIME in the last 168 hours. Cardiac Enzymes: No results for input(s): CKTOTAL, CKMB, CKMBINDEX, TROPONINI in the last 168 hours. BNP (last 3 results) No results for input(s): PROBNP in the last 8760 hours. HbA1C: No results for input(s): HGBA1C in the last 72 hours. CBG: Recent Labs  Lab 06/30/17 1146 06/30/17 1702 06/30/17 2038 07/01/17 0748 07/01/17 1142  GLUCAP 170* 225* 232* 165* 198*   Lipid Profile: No results for input(s): CHOL, HDL, LDLCALC, TRIG, CHOLHDL, LDLDIRECT in the last 72 hours. Thyroid Function Tests: No results for input(s): TSH, T4TOTAL, FREET4, T3FREE, THYROIDAB in the last 72 hours. Anemia Panel: No results for input(s): VITAMINB12, FOLATE, FERRITIN, TIBC, IRON, RETICCTPCT in the last 72 hours. Sepsis Labs: No results for input(s): PROCALCITON, LATICACIDVEN in the last 168 hours.  Recent Results (from the past 240 hour(s))  Respiratory Panel by PCR     Status: Abnormal   Collection Time: 06/27/17  8:53 AM  Result Value Ref Range Status   Adenovirus NOT DETECTED NOT DETECTED Final   Coronavirus 229E NOT DETECTED NOT DETECTED Final   Coronavirus HKU1 NOT DETECTED NOT DETECTED Final   Coronavirus NL63 NOT DETECTED NOT DETECTED Final    Coronavirus OC43 NOT DETECTED NOT DETECTED Final   Metapneumovirus DETECTED (A) NOT DETECTED Final   Rhinovirus / Enterovirus NOT DETECTED NOT DETECTED Final   Influenza A NOT DETECTED NOT DETECTED Final   Influenza A H1 NOT DETECTED NOT DETECTED Final   Influenza A H1 2009 NOT DETECTED NOT DETECTED Final   Influenza A H3 NOT DETECTED NOT DETECTED Final   Influenza B NOT DETECTED NOT DETECTED Final   Parainfluenza Virus 1 NOT DETECTED NOT DETECTED Final   Parainfluenza Virus 2 NOT DETECTED NOT DETECTED Final   Parainfluenza Virus 3 NOT DETECTED NOT DETECTED Final   Parainfluenza Virus 4 NOT DETECTED NOT DETECTED Final   Respiratory Syncytial Virus NOT DETECTED NOT DETECTED Final   Bordetella pertussis NOT DETECTED NOT DETECTED Final   Chlamydophila pneumoniae NOT DETECTED NOT DETECTED Final   Mycoplasma pneumoniae NOT DETECTED NOT DETECTED Final    Comment: Performed at [facility], [address], [place], [place] [address]         Radiology  Studies: Ct Chest Wo Contrast  Result Date: 07/01/2017 CLINICAL DATA:  Short of breath for 7 days.  Heart failure, EXAM: CT CHEST WITHOUT CONTRAST TECHNIQUE: Multidetector CT imaging of the chest was performed following the standard protocol without IV contrast. COMPARISON:  Radiograph 06/29/2017 FINDINGS: Cardiovascular: Post CABG anatomy Mediastinum/Nodes: No axillary or supraclavicular adenopathy. Mild paratracheal adenopathy with nodes measuring up to 11 mm. No pericardial effusion. Esophagus normal. Lungs/Pleura: In the LEFT lower lobe, there is a branching segmental nodular airspace pattern. Similar pattern in medial RIGHT lower lobe but less severe. Upper Abdomen: Abdominal aortic stent graft noted. Normal adrenal glands Musculoskeletal: No aggressive osseous lesion IMPRESSION: 1. Bibasilar mild airspace disease (greater on the LEFT) consistent with aspiration pneumonia or mild multifocal bronchopneumonia. 2. Post CABG. Electronically  Signed   By: Suzy Bouchard M.D.   On: 07/01/2017 14:22        Scheduled Meds: . acetylcysteine  3 mL Nebulization TID  . allopurinol  100 mg Oral Q supper  . amLODipine  5 mg Oral Daily  . azithromycin  250 mg Oral QHS  . budesonide (PULMICORT) nebulizer solution  0.25 mg Nebulization BID  . carvedilol  25 mg Oral BID WC  . gabapentin  300 mg Oral BID  . glipiZIDE  10 mg Oral BID  . guaiFENesin  1,200 mg Oral BID  . guaiFENesin-dextromethorphan  10 mL Oral Q6H  . insulin aspart  0-5 Units Subcutaneous QHS  . insulin aspart  0-9 Units Subcutaneous TID WC  . ipratropium-albuterol  3 mL Nebulization TID  . isosorbide mononitrate  120 mg Oral Daily  . isosorbide mononitrate  60 mg Oral QHS  . methylPREDNISolone (SOLU-MEDROL) injection  60 mg Intravenous Q6H  . potassium chloride SA  20 mEq Oral Daily  . simvastatin  40 mg Oral q1800  . sodium chloride flush  3 mL Intravenous Q12H  . tamsulosin  0.4 mg Oral QPC supper   Continuous Infusions: . sodium chloride    . ampicillin-sulbactam (UNASYN) IV       LOS: 4 days    Time spent:25 mins    Kathie Dike, MD [facility] Pager [phone]  If 7PM-7AM, please contact night-coverage [email or web address] Password [facility] 07/01/2017, 3:18 PM

## 2017-07-02 LAB — BASIC METABOLIC PANEL
ANION GAP: 14 (ref 5–15)
BUN: 62 mg/dL — ABNORMAL HIGH (ref 6–20)
CALCIUM: 8.9 mg/dL (ref 8.9–10.3)
CO2: 23 mmol/L (ref 22–32)
Chloride: 104 mmol/L (ref 101–111)
Creatinine, Ser: 1.87 mg/dL — ABNORMAL HIGH (ref 0.61–1.24)
GFR calc Af Amer: 41 mL/min — ABNORMAL LOW (ref >60)
GFR calc non Af Amer: 35 mL/min — ABNORMAL LOW (ref >60)
Glucose, Bld: 182 mg/dL — ABNORMAL HIGH (ref 65–99)
POTASSIUM: 4.7 mmol/L (ref 3.5–5.1)
Sodium: 141 mmol/L (ref 135–145)

## 2017-07-02 LAB — GLUCOSE, CAPILLARY
Glucose-Capillary: 199 mg/dL — ABNORMAL HIGH (ref 65–99)
Glucose-Capillary: 220 mg/dL — ABNORMAL HIGH (ref 65–99)
Glucose-Capillary: 195 mg/dL — ABNORMAL HIGH (ref 65–99)
Glucose-Capillary: 314 mg/dL — ABNORMAL HIGH (ref 65–99)

## 2017-07-02 MED ORDER — METHYLPREDNISOLONE SODIUM SUCC 125 MG IJ SOLR
60.0000 mg | Freq: Two times a day (BID) | INTRAMUSCULAR | Status: DC
  Administered 2017-07-03: 60 mg via INTRAVENOUS
  Filled 2017-07-02 (×2): qty 2

## 2017-07-02 NOTE — Progress Notes (Signed)
Subjective: He says he feels better.  He is still coughing and wheezing.  Chest CT yesterday consistent with pneumonia  Objective: Vital signs in last 24 hours: Temp:  [97.6 F (36.4 C)-98.5 F (36.9 C)] 97.6 F (36.4 C) (02/11 0453) Pulse Rate:  [59-60] 60 (02/11 0453) Resp:  [18-20] 18 (02/11 0453) BP: (156-161)/(73-77) 160/77 (02/11 0453) SpO2:  [92 %-98 %] 95 % (02/11 0453) Weight:  [110.4 kg (243 lb 6.2 oz)] 110.4 kg (243 lb 6.2 oz) (02/11 0453) Weight change: 1 kg (2 lb 3.3 oz) Last BM Date: 06/30/17  Intake/Output from previous day: 02/10 0701 - 02/11 0700 In: 1500 [P.O.:1200; IV Piggyback:300] Out: 1375 [Urine:1375]  PHYSICAL EXAM General appearance: alert, cooperative and no distress Resp: rhonchi bilaterally Cardio: regular rate and rhythm, S1, S2 normal, no murmur, click, rub or gallop GI: soft, non-tender; bowel sounds normal; no masses,  no organomegaly Extremities: extremities normal, atraumatic, no cyanosis or edema Skin warm and dry  Lab Results:  Results for orders placed or performed during the hospital encounter of 06/26/17 (from the past 48 hour(s))  Glucose, capillary     Status: Abnormal   Collection Time: 06/30/17 11:46 AM  Result Value Ref Range   Glucose-Capillary 170 (H) 65 - 99 mg/dL  Glucose, capillary     Status: Abnormal   Collection Time: 06/30/17  5:02 PM  Result Value Ref Range   Glucose-Capillary 225 (H) 65 - 99 mg/dL  Glucose, capillary     Status: Abnormal   Collection Time: 06/30/17  8:38 PM  Result Value Ref Range   Glucose-Capillary 232 (H) 65 - 99 mg/dL   Comment 1 Notify RN    Comment 2 Document in Chart   Basic metabolic panel     Status: Abnormal   Collection Time: 07/01/17  6:17 AM  Result Value Ref Range   Sodium 141 135 - 145 mmol/L   Potassium 4.4 3.5 - 5.1 mmol/L   Chloride 106 101 - 111 mmol/L   CO2 24 22 - 32 mmol/L   Glucose, Bld 176 (H) 65 - 99 mg/dL   BUN 58 (H) 6 - 20 mg/dL   Creatinine, Ser 1.61 (H) 0.61 -  1.24 mg/dL   Calcium 8.9 8.9 - 10.3 mg/dL   GFR calc non Af Amer 42 (L) >60 mL/min   GFR calc Af Amer 49 (L) >60 mL/min    Comment: (NOTE) The eGFR has been calculated using the CKD EPI equation. This calculation has not been validated in all clinical situations. eGFR's persistently <60 mL/min signify possible Chronic Kidney Disease.    Anion gap 11 5 - 15    Comment: Performed at [facility], [address], [place], [place] [address]  Glucose, capillary     Status: Abnormal   Collection Time: 07/01/17  7:48 AM  Result Value Ref Range   Glucose-Capillary 165 (H) 65 - 99 mg/dL  Glucose, capillary     Status: Abnormal   Collection Time: 07/01/17 11:42 AM  Result Value Ref Range   Glucose-Capillary 198 (H) 65 - 99 mg/dL   Comment 1 Notify RN    Comment 2 Document in Chart   Glucose, capillary     Status: Abnormal   Collection Time: 07/01/17  4:33 PM  Result Value Ref Range   Glucose-Capillary 225 (H) 65 - 99 mg/dL   Comment 1 Notify RN    Comment 2 Document in Chart   Glucose, capillary     Status: Abnormal   Collection  Time: 07/01/17  8:55 PM  Result Value Ref Range   Glucose-Capillary 221 (H) 65 - 99 mg/dL   Comment 1 Notify RN    Comment 2 Document in Chart   Basic metabolic panel     Status: Abnormal   Collection Time: 07/02/17  5:24 AM  Result Value Ref Range   Sodium 141 135 - 145 mmol/L   Potassium 4.7 3.5 - 5.1 mmol/L   Chloride 104 101 - 111 mmol/L   CO2 23 22 - 32 mmol/L   Glucose, Bld 182 (H) 65 - 99 mg/dL   BUN 62 (H) 6 - 20 mg/dL   Creatinine, Ser 1.87 (H) 0.61 - 1.24 mg/dL   Calcium 8.9 8.9 - 10.3 mg/dL   GFR calc non Af Amer 35 (L) >60 mL/min   GFR calc Af Amer 41 (L) >60 mL/min    Comment: (NOTE) The eGFR has been calculated using the CKD EPI equation. This calculation has not been validated in all clinical situations. eGFR's persistently <60 mL/min signify possible Chronic Kidney Disease.    Anion gap 14 5 - 15    Comment: Performed at [facility], [address], [place], [place] [address]  Glucose, capillary     Status: Abnormal   Collection Time: 07/02/17  7:56 AM  Result Value Ref Range   Glucose-Capillary 199 (H) 65 - 99 mg/dL    ABGS No results for input(s): PHART, PO2ART, TCO2, HCO3 in the last 72 hours.  Invalid input(s): PCO2 CULTURES Recent Results (from the past 240 hour(s))  Respiratory Panel by PCR     Status: Abnormal   Collection Time: 06/27/17  8:53 AM  Result Value Ref Range Status   Adenovirus NOT DETECTED NOT DETECTED Final   Coronavirus 229E NOT DETECTED NOT DETECTED Final   Coronavirus HKU1 NOT DETECTED NOT DETECTED Final   Coronavirus NL63 NOT DETECTED NOT DETECTED Final   Coronavirus OC43 NOT DETECTED NOT DETECTED Final   Metapneumovirus DETECTED (A) NOT DETECTED Final   Rhinovirus / Enterovirus NOT DETECTED NOT DETECTED Final   Influenza A NOT DETECTED NOT DETECTED Final   Influenza A H1 NOT DETECTED NOT DETECTED Final   Influenza A H1 2009 NOT DETECTED NOT DETECTED Final   Influenza A H3 NOT DETECTED NOT DETECTED Final   Influenza B NOT DETECTED NOT DETECTED Final   Parainfluenza Virus 1 NOT DETECTED NOT DETECTED Final   Parainfluenza Virus 2 NOT DETECTED NOT DETECTED Final   Parainfluenza Virus 3 NOT DETECTED NOT DETECTED Final   Parainfluenza Virus 4 NOT DETECTED NOT DETECTED Final   Respiratory Syncytial Virus NOT DETECTED NOT DETECTED Final   Bordetella pertussis NOT DETECTED NOT DETECTED Final   Chlamydophila pneumoniae NOT DETECTED NOT DETECTED Final   Mycoplasma pneumoniae NOT DETECTED NOT DETECTED Final    Comment: Performed at [facility], [place]. [address], [place],  [address]   Studies/Results: Ct Chest Wo Contrast  Result Date: 07/01/2017 CLINICAL DATA:  Short of breath for 7 days.  Heart failure, EXAM: CT CHEST WITHOUT CONTRAST TECHNIQUE: Multidetector CT imaging of the chest was performed following the standard protocol without IV contrast. COMPARISON:  Radiograph  06/29/2017 FINDINGS: Cardiovascular: Post CABG anatomy Mediastinum/Nodes: No axillary or supraclavicular adenopathy. Mild paratracheal adenopathy with nodes measuring up to 11 mm. No pericardial effusion. Esophagus normal. Lungs/Pleura: In the LEFT lower lobe, there is a branching segmental nodular airspace pattern. Similar pattern in medial RIGHT lower lobe but less severe. Upper Abdomen: Abdominal aortic stent  graft noted. Normal adrenal glands Musculoskeletal: No aggressive osseous lesion IMPRESSION: 1. Bibasilar mild airspace disease (greater on the LEFT) consistent with aspiration pneumonia or mild multifocal bronchopneumonia. 2. Post CABG. Electronically Signed   By: Suzy Bouchard M.D.   On: 07/01/2017 14:22    Medications:  Prior to Admission:  Medications Prior to Admission  Medication Sig Dispense Refill Last Dose  . acetaminophen (TYLENOL) 500 MG tablet Take 1,000 mg by mouth daily as needed for mild pain, moderate pain or headache (pain).    06/26/2017 at morning  . allopurinol (ZYLOPRIM) 100 MG tablet Take 100 mg by mouth daily with supper.    06/26/2017 at Unknown time  . amLODipine (NORVASC) 5 MG tablet TAKE 1 TABLET BY MOUTH ONCE DAILY. PATIENT NEEDS TO BE SEEN. (Patient taking differently: TAKE 1 TABLET BY MOUTH ONCE DAILY.) 15 tablet 0 06/26/2017 at Unknown time  . aspirin EC 81 MG tablet Take 81 mg by mouth daily.   06/26/2017 at Unknown time  . carvedilol (COREG) 25 MG tablet TAKE (1) TABLET TWICE DAILY. 60 tablet 6 06/26/2017 at 1830  . furosemide (LASIX) 80 MG tablet TAKE (1) TABLET TWICE DAILY. 60 tablet 2 06/26/2017 at Unknown time  . gabapentin (NEURONTIN) 300 MG capsule Take 300 mg by mouth 2 (two) times daily.   06/26/2017 at Unknown time  . glipiZIDE (GLUCOTROL XL) 10 MG 24 hr tablet Take 10 mg by mouth 2 (two) times daily.    06/26/2017 at Unknown time  . isosorbide mononitrate (IMDUR) 60 MG 24 hr tablet Take 60-120 mg by mouth 2 (two) times daily. 120 mg in the am, 53m in the  pm    06/26/2017 at Unknown time  . nitroGLYCERIN (NITROSTAT) 0.4 MG SL tablet PLACE 0.4 MG  UNDER THE TONGUE EVERY 5 MINUTES UP TO 3 DOSES AS NEEDED FOR CHEST PAIN. 25 tablet 3 unknown  . potassium chloride (K-DUR) 10 MEQ tablet TAKE 2 TABLETS BY MOUTH ONCE DAILY. 60 tablet 2 06/26/2017 at Unknown time  . simvastatin (ZOCOR) 40 MG tablet Take 40 mg by mouth daily.   06/26/2017 at Unknown time   Scheduled: . acetylcysteine  3 mL Nebulization TID  . allopurinol  100 mg Oral Q supper  . amLODipine  5 mg Oral Daily  . azithromycin  250 mg Oral QHS  . budesonide (PULMICORT) nebulizer solution  0.25 mg Nebulization BID  . carvedilol  25 mg Oral BID WC  . gabapentin  300 mg Oral BID  . glipiZIDE  10 mg Oral BID  . guaiFENesin  1,200 mg Oral BID  . guaiFENesin-dextromethorphan  10 mL Oral Q6H  . insulin aspart  0-5 Units Subcutaneous QHS  . insulin aspart  0-9 Units Subcutaneous TID WC  . ipratropium-albuterol  3 mL Nebulization TID  . isosorbide mononitrate  120 mg Oral Daily  . isosorbide mononitrate  60 mg Oral QHS  . methylPREDNISolone (SOLU-MEDROL) injection  60 mg Intravenous Q6H  . potassium chloride SA  20 mEq Oral Daily  . simvastatin  40 mg Oral q1800  . sodium chloride flush  3 mL Intravenous Q12H  . tamsulosin  0.4 mg Oral QPC supper   Continuous: . sodium chloride    . ampicillin-sulbactam (UNASYN) IV Stopped (07/02/17 0411)   PEGB:TDVVOHchloride, acetaminophen, nitroGLYCERIN, ondansetron **OR** ondansetron (ZOFRAN) IV, sodium chloride flush  Assesment: He has acute hypoxic respiratory failure and has been slow to respond.  He was found to have a viral bronchitis but CT is consistent  with pneumonia and he is currently on Unasyn which I think is appropriate.  He may have some element of COPD at baseline and that can be investigated as an outpatient  He has ischemic cardiomyopathy but I do not think he has overt heart failure now Principal Problem:   Acute respiratory failure with  hypoxemia ([place]) Active Problems:   Diabetes type 2, controlled ([place])   Chronic renal impairment, stage 3 (moderate) (HCC)   Essential hypertension   Cardiomyopathy, ischemic   S/P ICD (internal cardiac defibrillator) procedure   Chronic diastolic CHF (congestive heart failure) ([place])   Bronchitis    Plan: Continue current treatments.    LOS: 5 days   HAWKINS,EDWARD L 07/02/2017, 8:25 AM

## 2017-07-02 NOTE — Progress Notes (Signed)
Inpatient Diabetes Program Recommendations  AACE/ADA: New Consensus Statement on Inpatient Glycemic Control (2015)  Target Ranges:  Prepandial:   less than 140 mg/dL      Peak postprandial:   less than 180 mg/dL (1-2 hours)      Critically ill patients:  140 - 180 mg/dL   Lab Results  Component Value Date   GLUCAP 220 (H) 07/02/2017   HGBA1C 6.4 (H) 09/30/2014    Review of Glycemic Control Results for [name], Austin "SKIP" (MRN [id number]) as of 07/02/2017 14:36  Ref. Range 06/30/2017 17:02 06/30/2017 20:38 07/01/2017 07:48 07/01/2017 11:42 07/01/2017 16:33 07/01/2017 20:55 07/02/2017 07:56 07/02/2017 12:33  Glucose-Capillary Latest Ref Range: 65 - 99 mg/dL 225 (H) 232 (H) 165 (H) 198 (H) 225 (H) 221 (H) 199 (H) 220 (H)    Diabetes history: DM2 Outpatient Diabetes medications: Glucotrol XL 10 mg bid Current orders for Inpatient glycemic control:  Solumedrol 60 mg IV q 6 hours, Novolog sensitive tid with meals and HS, Glipizide 10 mg bid  Inpatient Diabetes Program Recommendations:    Please consider adding Novolog 3 units tid with meals (hold if patient eats less than 50%) while on steroids.    Thanks,  Adah Perl, RN, BC-ADM Inpatient Diabetes Coordinator Pager [phone] (8a-5p)

## 2017-07-02 NOTE — Progress Notes (Signed)
PROGRESS NOTE    [name]  GGY:[id number] DOB: [birth year] DOA: 06/26/2017 PCP: Doree Albee, MD    Brief Narrative:  72 year old male with a history of diastolic heart failure, diabetes, hypertension, presents to the hospital with worsening shortness of breath and wheezing.  Found to have viral bronchitis with significant wheezing and acute respiratory failure.  On IV steroids.  May have an element of COPD.  Continue current treatments.   Assessment & Plan:   Principal Problem:   Acute respiratory failure with hypoxemia (HCC) Active Problems:   Diabetes type 2, controlled (HCC)   Chronic renal impairment, stage 3 (moderate) (HCC)   Essential hypertension   Cardiomyopathy, ischemic   S/P ICD (internal cardiac defibrillator) procedure   Chronic diastolic CHF (congestive heart failure) (HCC)   Bronchitis  1. Acute respiratory failure with hypoxia.  Patient has not made any significant improvement.  Pulmonology consulted and ordered CT chest which indicated possible pneumonia.  Wean down oxygen as tolerated.  Still requiring 2 L. 2. Acute bronchitis.  Respiratory viral panel positive for Metapneumovirus.  He may also have an element of COPD exacerbation.  Although he does not have a formal diagnosis of COPD, he does have a long history of tobacco use in the past.  Would likely benefit from outpatient PFTs.  He is currently on intravenous steroids, but overall wheezing has improved.  We will decrease steroid dose from every 6 hours to every 12 hours.  Continue current treatments 3. Pneumonia.  CT scan indicated multifocal pneumonia versus aspiration pneumonia.  Currently on intravenous antibiotics.  Continue current treatments.. 4. Chronic diastolic congestive heart failure.  Stable.  Appears compensated at this time.  Continue current treatments. 5. Hematuria with history of kidney stones.  Seen by urology.  Will likely need office follow-up for cystoscopy.  Plan for outpatient  follow-up. 6. Diabetes.  Blood sugars are currently stable.  Continue on sliding scale chronic kidney disease stage III.  Mild bump in creatinine of 1.8.  Continue to monitor   DVT prophylaxis: SCDs Code Status: Full code Family Communication: No family present Disposition Plan: Discharge home once improved   Consultants:   Urology  Pulmonology  Procedures:    Antimicrobials:   Azithromycin 2/8 >  Unasyn 2/10 >   Subjective: Continues to cough, but is now producing sputum.  Wheezing is better.  Feels that overall shortness of breath is improving  Objective: Vitals:   07/02/17 1452 07/02/17 1500 07/02/17 1800 07/02/17 1900  BP:  (!) 158/66    Pulse:  60 66   Resp:  18    Temp:  (!) 97.3 F (36.3 C)    TempSrc:  Oral    SpO2: 92% 96%    Weight:    110.8 kg (244 lb 4.3 oz)  Height:        Intake/Output Summary (Last 24 hours) at 07/02/2017 1905 Last data filed at 07/02/2017 1824 Gross per 24 hour  Intake 1240 ml  Output 3 ml  Net 1237 ml   Filed Weights   07/01/17 0500 07/02/17 0453 07/02/17 1900  Weight: 109.4 kg (241 lb 2.9 oz) 110.4 kg (243 lb 6.2 oz) 110.8 kg (244 lb 4.3 oz)    Examination:  General exam: Alert, awake, oriented x 3 Respiratory system: Wheezing has resolved.  He does have bilateral rhonchi. Respiratory effort normal. Cardiovascular system:RRR. No murmurs, rubs, gallops. Gastrointestinal system: Abdomen is nondistended, soft and nontender. No organomegaly or masses felt. Normal bowel sounds heard. Central  nervous system: Alert and oriented. No focal neurological deficits. Extremities: No C/C/E, +pedal pulses Skin: No rashes, lesions or ulcers Psychiatry: Judgement and insight appear normal. Mood & affect appropriate.   Data Reviewed: I have personally reviewed following labs and imaging studies  CBC: Recent Labs  Lab 06/26/17 2303 06/27/17 0609 06/28/17 0336  WBC 8.0 9.0 12.8*  NEUTROABS 4.5  --   --   HGB 13.8 13.9 13.0  HCT  41.8 43.5 39.8  MCV 96.1 98.2 96.4  PLT 288 311 809   Basic Metabolic Panel: Recent Labs  Lab 06/28/17 0336 06/29/17 0848 06/30/17 0631 07/01/17 0617 07/02/17 0524  NA 138 138 138 141 141  K 4.1 4.1 4.4 4.4 4.7  CL 102 102 104 106 104  CO2 23 23 23 24 23   GLUCOSE 148* 205* 163* 176* 182*  BUN 41* 45* 52* 58* 62*  CREATININE 1.85* 1.66* 1.63* 1.61* 1.87*  CALCIUM 9.0 8.9 9.0 8.9 8.9   GFR: Estimated Creatinine Clearance: 48.7 mL/min (A) (by C-G formula based on SCr of 1.87 mg/dL (H)). Liver Function Tests: No results for input(s): AST, ALT, ALKPHOS, BILITOT, PROT, ALBUMIN in the last 168 hours. No results for input(s): LIPASE, AMYLASE in the last 168 hours. No results for input(s): AMMONIA in the last 168 hours. Coagulation Profile: No results for input(s): INR, PROTIME in the last 168 hours. Cardiac Enzymes: No results for input(s): CKTOTAL, CKMB, CKMBINDEX, TROPONINI in the last 168 hours. BNP (last 3 results) No results for input(s): PROBNP in the last 8760 hours. HbA1C: No results for input(s): HGBA1C in the last 72 hours. CBG: Recent Labs  Lab 07/01/17 1633 07/01/17 2055 07/02/17 0756 07/02/17 1233 07/02/17 1729  GLUCAP 225* 221* 199* 220* 195*   Lipid Profile: No results for input(s): CHOL, HDL, LDLCALC, TRIG, CHOLHDL, LDLDIRECT in the last 72 hours. Thyroid Function Tests: No results for input(s): TSH, T4TOTAL, FREET4, T3FREE, THYROIDAB in the last 72 hours. Anemia Panel: No results for input(s): VITAMINB12, FOLATE, FERRITIN, TIBC, IRON, RETICCTPCT in the last 72 hours. Sepsis Labs: No results for input(s): PROCALCITON, LATICACIDVEN in the last 168 hours.  Recent Results (from the past 240 hour(s))  Respiratory Panel by PCR     Status: Abnormal   Collection Time: 06/27/17  8:53 AM  Result Value Ref Range Status   Adenovirus NOT DETECTED NOT DETECTED Final   Coronavirus 229E NOT DETECTED NOT DETECTED Final   Coronavirus HKU1 NOT DETECTED NOT DETECTED  Final   Coronavirus NL63 NOT DETECTED NOT DETECTED Final   Coronavirus OC43 NOT DETECTED NOT DETECTED Final   Metapneumovirus DETECTED (A) NOT DETECTED Final   Rhinovirus / Enterovirus NOT DETECTED NOT DETECTED Final   Influenza A NOT DETECTED NOT DETECTED Final   Influenza A H1 NOT DETECTED NOT DETECTED Final   Influenza A H1 2009 NOT DETECTED NOT DETECTED Final   Influenza A H3 NOT DETECTED NOT DETECTED Final   Influenza B NOT DETECTED NOT DETECTED Final   Parainfluenza Virus 1 NOT DETECTED NOT DETECTED Final   Parainfluenza Virus 2 NOT DETECTED NOT DETECTED Final   Parainfluenza Virus 3 NOT DETECTED NOT DETECTED Final   Parainfluenza Virus 4 NOT DETECTED NOT DETECTED Final   Respiratory Syncytial Virus NOT DETECTED NOT DETECTED Final   Bordetella pertussis NOT DETECTED NOT DETECTED Final   Chlamydophila pneumoniae NOT DETECTED NOT DETECTED Final   Mycoplasma pneumoniae NOT DETECTED NOT DETECTED Final    Comment: Performed at [facility], [address]  [place], [place] [address]         Radiology Studies: Ct Chest Wo Contrast  Result Date: 07/01/2017 CLINICAL DATA:  Short of breath for 7 days.  Heart failure, EXAM: CT CHEST WITHOUT CONTRAST TECHNIQUE: Multidetector CT imaging of the chest was performed following the standard protocol without IV contrast. COMPARISON:  Radiograph 06/29/2017 FINDINGS: Cardiovascular: Post CABG anatomy Mediastinum/Nodes: No axillary or supraclavicular adenopathy. Mild paratracheal adenopathy with nodes measuring up to 11 mm. No pericardial effusion. Esophagus normal. Lungs/Pleura: In the LEFT lower lobe, there is a branching segmental nodular airspace pattern. Similar pattern in medial RIGHT lower lobe but less severe. Upper Abdomen: Abdominal aortic stent graft noted. Normal adrenal glands Musculoskeletal: No aggressive osseous lesion IMPRESSION: 1. Bibasilar mild airspace disease (greater on the LEFT) consistent with aspiration pneumonia or mild  multifocal bronchopneumonia. 2. Post CABG. Electronically Signed   By: Suzy Bouchard M.D.   On: 07/01/2017 14:22        Scheduled Meds: . acetylcysteine  3 mL Nebulization TID  . allopurinol  100 mg Oral Q supper  . amLODipine  5 mg Oral Daily  . azithromycin  250 mg Oral QHS  . budesonide (PULMICORT) nebulizer solution  0.25 mg Nebulization BID  . carvedilol  25 mg Oral BID WC  . gabapentin  300 mg Oral BID  . glipiZIDE  10 mg Oral BID  . guaiFENesin  1,200 mg Oral BID  . guaiFENesin-dextromethorphan  10 mL Oral Q6H  . insulin aspart  0-5 Units Subcutaneous QHS  . insulin aspart  0-9 Units Subcutaneous TID WC  . ipratropium-albuterol  3 mL Nebulization TID  . isosorbide mononitrate  120 mg Oral Daily  . isosorbide mononitrate  60 mg Oral QHS  . methylPREDNISolone (SOLU-MEDROL) injection  60 mg Intravenous Q6H  . potassium chloride SA  20 mEq Oral Daily  . simvastatin  40 mg Oral q1800  . sodium chloride flush  3 mL Intravenous Q12H  . tamsulosin  0.4 mg Oral QPC supper   Continuous Infusions: . sodium chloride    . ampicillin-sulbactam (UNASYN) IV Stopped (07/02/17 1602)     LOS: 5 days    Time spent: 25 minutes    Kathie Dike, MD [facility] Pager [phone]  If 7PM-7AM, please contact night-coverage [email or web address] Password [facility] 07/02/2017, 7:05 PM

## 2017-07-03 DIAGNOSIS — N200 Calculus of kidney: Secondary | ICD-10-CM

## 2017-07-03 DIAGNOSIS — N3289 Other specified disorders of bladder: Secondary | ICD-10-CM

## 2017-07-03 DIAGNOSIS — R310 Gross hematuria: Secondary | ICD-10-CM | POA: Diagnosis not present

## 2017-07-03 LAB — GLUCOSE, CAPILLARY
GLUCOSE-CAPILLARY: 293 mg/dL — AB (ref 65–99)
GLUCOSE-CAPILLARY: 222 mg/dL — AB (ref 65–99)
Glucose-Capillary: 188 mg/dL — ABNORMAL HIGH (ref 65–99)
Glucose-Capillary: 189 mg/dL — ABNORMAL HIGH (ref 65–99)

## 2017-07-03 MED ORDER — METHYLPREDNISOLONE SODIUM SUCC 125 MG IJ SOLR
60.0000 mg | Freq: Every day | INTRAMUSCULAR | Status: DC
  Administered 2017-07-04: 60 mg via INTRAVENOUS
  Filled 2017-07-03: qty 2

## 2017-07-03 MED ORDER — ORAL CARE MOUTH RINSE
15.0000 mL | Freq: Two times a day (BID) | OROMUCOSAL | Status: DC
  Administered 2017-07-03 (×2): 15 mL via OROMUCOSAL

## 2017-07-03 NOTE — Progress Notes (Signed)
PROGRESS NOTE    [name]  GDJ:[id number] DOB: [birth year] DOA: 06/26/2017 PCP: Doree Albee, MD    Brief Narrative:  72 year old male with a history of diastolic heart failure, diabetes, hypertension, presents to the hospital with worsening shortness of breath and wheezing.  Found to have viral bronchitis with significant wheezing and acute respiratory failure.  He was started on IV steroids, but was very slow to improve.  Was seen by pulmonology and underwent CT chest indicated underlying pneumonia.  Started on IV antibiotics.  He has been slowly improving.  Anticipate discharge in the next 24 hours   Assessment & Plan:   Principal Problem:   Acute respiratory failure with hypoxemia (HCC) Active Problems:   Diabetes type 2, controlled (HCC)   Chronic renal impairment, stage 3 (moderate) (HCC)   Essential hypertension   Cardiomyopathy, ischemic   S/P ICD (internal cardiac defibrillator) procedure   Chronic diastolic CHF (congestive heart failure) (HCC)   Bronchitis  1. Acute respiratory failure with hypoxia.  Patient has not made any significant improvement.  Pulmonology consulted and ordered CT chest which indicated possible pneumonia.  Wean down oxygen as tolerated.  Still on 2 L of oxygen this morning.  Will discontinue today and monitor for any recurrent hypoxia 2. Acute bronchitis.  Respiratory viral panel positive for Metapneumovirus.  He may also have an element of COPD exacerbation.  Although he does not have a formal diagnosis of COPD, he does have a long history of tobacco use in the past.  Would likely benefit from outpatient PFTs.  He is currently on intravenous steroids, but overall wheezing has improved.  We will continue to wean down steroids further to once daily.  Can likely transition to prednisone taper in a.m. 3. Pneumonia.  CT scan indicated multifocal pneumonia versus aspiration pneumonia.  He is currently on IV antibiotics.  If he continues to improve,  can anticipate transitioning to oral antibiotics in a.m. 4. Chronic diastolic congestive heart failure.  Stable.  Appears compensated at this time.  Continue current treatments. 5. Hematuria with history of kidney stones.  Seen by urology.  Plans are for outpatient follow-up for cystoscopy.. 6. Diabetes.  Blood sugars are currently stable.  Continue on sliding scale  7. chronic kidney disease stage III.  Creatinine is currently stable.  Continue to monitor   DVT prophylaxis: SCDs Code Status: Full code Family Communication: No family present Disposition Plan: Discharge home once improved, hopefully in the next 24 hours   Consultants:   Urology  Pulmonology  Procedures:    Antimicrobials:   Azithromycin 2/8 >  Unasyn 2/10 >   Subjective: Feels that breathing is slowly improving.  Continues to have productive cough.  No wheezing.  Objective: Vitals:   07/03/17 0454 07/03/17 0700 07/03/17 1534 07/03/17 1546  BP: (!) 158/77  (!) 152/64   Pulse: 60  61   Resp: 18  18   Temp: 97.8 F (36.6 C)  (!) 97.5 F (36.4 C)   TempSrc: Oral  Oral   SpO2: 94% 94% 91% 92%  Weight: 111.5 kg (245 lb 13 oz)     Height:        Intake/Output Summary (Last 24 hours) at 07/03/2017 1720 Last data filed at 07/03/2017 1620 Gross per 24 hour  Intake 1489 ml  Output 1203 ml  Net 286 ml   Filed Weights   07/02/17 0453 07/02/17 1900 07/03/17 0454  Weight: 110.4 kg (243 lb 6.2 oz) 110.8 kg (244 lb 4.3  oz) 111.5 kg (245 lb 13 oz)    Examination:  General exam: Alert, awake, oriented x 3 Respiratory system: bilateral rhonchi, no wheezing. Respiratory effort normal. Cardiovascular system:RRR. No murmurs, rubs, gallops. Gastrointestinal system: Abdomen is nondistended, soft and nontender. No organomegaly or masses felt. Normal bowel sounds heard. Central nervous system: Alert and oriented. No focal neurological deficits. Extremities: No C/C/E, +pedal pulses Skin: No rashes, lesions or  ulcers Psychiatry: Judgement and insight appear normal. Mood & affect appropriate.    Data Reviewed: I have personally reviewed following labs and imaging studies  CBC: Recent Labs  Lab 06/26/17 2303 06/27/17 0609 06/28/17 0336  WBC 8.0 9.0 12.8*  NEUTROABS 4.5  --   --   HGB 13.8 13.9 13.0  HCT 41.8 43.5 39.8  MCV 96.1 98.2 96.4  PLT 288 311 253   Basic Metabolic Panel: Recent Labs  Lab 06/28/17 0336 06/29/17 0848 06/30/17 0631 07/01/17 0617 07/02/17 0524  NA 138 138 138 141 141  K 4.1 4.1 4.4 4.4 4.7  CL 102 102 104 106 104  CO2 23 23 23 24 23   GLUCOSE 148* 205* 163* 176* 182*  BUN 41* 45* 52* 58* 62*  CREATININE 1.85* 1.66* 1.63* 1.61* 1.87*  CALCIUM 9.0 8.9 9.0 8.9 8.9   GFR: Estimated Creatinine Clearance: 48.8 mL/min (A) (by C-G formula based on SCr of 1.87 mg/dL (H)). Liver Function Tests: No results for input(s): AST, ALT, ALKPHOS, BILITOT, PROT, ALBUMIN in the last 168 hours. No results for input(s): LIPASE, AMYLASE in the last 168 hours. No results for input(s): AMMONIA in the last 168 hours. Coagulation Profile: No results for input(s): INR, PROTIME in the last 168 hours. Cardiac Enzymes: No results for input(s): CKTOTAL, CKMB, CKMBINDEX, TROPONINI in the last 168 hours. BNP (last 3 results) No results for input(s): PROBNP in the last 8760 hours. HbA1C: No results for input(s): HGBA1C in the last 72 hours. CBG: Recent Labs  Lab 07/02/17 1729 07/02/17 2116 07/03/17 0748 07/03/17 1121 07/03/17 1704  GLUCAP 195* 314* 188* 189* 293*   Lipid Profile: No results for input(s): CHOL, HDL, LDLCALC, TRIG, CHOLHDL, LDLDIRECT in the last 72 hours. Thyroid Function Tests: No results for input(s): TSH, T4TOTAL, FREET4, T3FREE, THYROIDAB in the last 72 hours. Anemia Panel: No results for input(s): VITAMINB12, FOLATE, FERRITIN, TIBC, IRON, RETICCTPCT in the last 72 hours. Sepsis Labs: No results for input(s): PROCALCITON, LATICACIDVEN in the last 168  hours.  Recent Results (from the past 240 hour(s))  Respiratory Panel by PCR     Status: Abnormal   Collection Time: 06/27/17  8:53 AM  Result Value Ref Range Status   Adenovirus NOT DETECTED NOT DETECTED Final   Coronavirus 229E NOT DETECTED NOT DETECTED Final   Coronavirus HKU1 NOT DETECTED NOT DETECTED Final   Coronavirus NL63 NOT DETECTED NOT DETECTED Final   Coronavirus OC43 NOT DETECTED NOT DETECTED Final   Metapneumovirus DETECTED (A) NOT DETECTED Final   Rhinovirus / Enterovirus NOT DETECTED NOT DETECTED Final   Influenza A NOT DETECTED NOT DETECTED Final   Influenza A H1 NOT DETECTED NOT DETECTED Final   Influenza A H1 2009 NOT DETECTED NOT DETECTED Final   Influenza A H3 NOT DETECTED NOT DETECTED Final   Influenza B NOT DETECTED NOT DETECTED Final   Parainfluenza Virus 1 NOT DETECTED NOT DETECTED Final   Parainfluenza Virus 2 NOT DETECTED NOT DETECTED Final   Parainfluenza Virus 3 NOT DETECTED NOT DETECTED Final   Parainfluenza Virus 4 NOT DETECTED NOT  DETECTED Final   Respiratory Syncytial Virus NOT DETECTED NOT DETECTED Final   Bordetella pertussis NOT DETECTED NOT DETECTED Final   Chlamydophila pneumoniae NOT DETECTED NOT DETECTED Final   Mycoplasma pneumoniae NOT DETECTED NOT DETECTED Final    Comment: Performed at [facility], [place] [address], [place], [place] [address]         Radiology Studies: No results found.      Scheduled Meds: . acetylcysteine  3 mL Nebulization TID  . allopurinol  100 mg Oral Q supper  . amLODipine  5 mg Oral Daily  . azithromycin  250 mg Oral QHS  . budesonide (PULMICORT) nebulizer solution  0.25 mg Nebulization BID  . carvedilol  25 mg Oral BID WC  . gabapentin  300 mg Oral BID  . glipiZIDE  10 mg Oral BID  . guaiFENesin  1,200 mg Oral BID  . guaiFENesin-dextromethorphan  10 mL Oral Q6H  . insulin aspart  0-5 Units Subcutaneous QHS  . insulin aspart  0-9 Units Subcutaneous TID WC  . ipratropium-albuterol  3 mL  Nebulization TID  . isosorbide mononitrate  120 mg Oral Daily  . isosorbide mononitrate  60 mg Oral QHS  . mouth rinse  15 mL Mouth Rinse BID  . methylPREDNISolone (SOLU-MEDROL) injection  60 mg Intravenous Q12H  . potassium chloride SA  20 mEq Oral Daily  . simvastatin  40 mg Oral q1800  . sodium chloride flush  3 mL Intravenous Q12H  . tamsulosin  0.4 mg Oral QPC supper   Continuous Infusions: . sodium chloride    . ampicillin-sulbactam (UNASYN) IV Stopped (07/03/17 1620)     LOS: 6 days    Time spent: 25 mins    Kathie Dike, MD [facility] Pager [phone]  If 7PM-7AM, please contact night-coverage [email or web address] Password [email or web address] 07/03/2017, 5:20 PM

## 2017-07-03 NOTE — Care Management Note (Signed)
Case Management Note  Patient Details  Name: [name] MRN: [id number] Date of Birth: 01-13-1946  If discussed at Long Length of Stay Meetings, dates discussed:  07/03/2017  Additional Comments:  Anureet Bruington, Chauncey Reading, RN 07/03/2017, 12:06 PM

## 2017-07-03 NOTE — Progress Notes (Signed)
Inpatient Diabetes Program Recommendations  AACE/ADA: New Consensus Statement on Inpatient Glycemic Control (2015)  Target Ranges:  Prepandial:   less than 140 mg/dL      Peak postprandial:   less than 180 mg/dL (1-2 hours)      Critically ill patients:  140 - 180 mg/dL   Lab Results  Component Value Date   GLUCAP 188 (H) 07/03/2017   HGBA1C 6.4 (H) 09/30/2014    Review of Glycemic Control  Results for [name], [name] (MRN [id number]) as of 07/03/2017 10:40  Ref. Range 07/02/2017 07:56 07/02/2017 12:33 07/02/2017 17:29 07/02/2017 21:16 07/03/2017 07:48  Glucose-Capillary Latest Ref Range: 65 - 99 mg/dL 199 (H) 220 (H) 195 (H) 314 (H) 188 (H)    Diabetes history: DM2  Outpatient Diabetes medications: Glucotrol XL 10 mg bid  Current orders for Inpatient glycemic control: Novolog sensitive tid with meals,  Novolog 0-5 units qhs,  Glipizide 10 mg bid,  Solumedrol 60 mg IV q 12 hours  Inpatient Diabetes Program Recommendations:  Please consider adding Novolog 2 units tid with meals (hold if patient eats less than 50%) while on steroids.    Austin Fitz, RN, BA, MHA, CDE Diabetes Coordinator Inpatient Diabetes Program  [phone] (Team Pager) [phone] ([place]) 07/03/2017 10:45 AM

## 2017-07-03 NOTE — Progress Notes (Signed)
Subjective: He says he feels better.  He is still coughing.  He is still short of breath with exertion but that is improving.  Objective: Vital signs in last 24 hours: Temp:  [97.3 F (36.3 C)-97.8 F (36.6 C)] 97.8 F (36.6 C) (02/12 0454) Pulse Rate:  [60-69] 60 (02/12 0454) Resp:  [18-19] 18 (02/12 0454) BP: (158-160)/(66-77) 158/77 (02/12 0454) SpO2:  [92 %-96 %] 94 % (02/12 0700) Weight:  [110.8 kg (244 lb 4.3 oz)-111.5 kg (245 lb 13 oz)] 111.5 kg (245 lb 13 oz) (02/12 0454) Weight change: 0.4 kg (14.1 oz) Last BM Date: 07/02/17  Intake/Output from previous day: 02/11 0701 - 02/12 0700 In: 8119 [P.O.:840; I.V.:6; IV Piggyback:400] Out: 1203 [Urine:1203]  PHYSICAL EXAM General appearance: alert, cooperative and no distress Resp: rhonchi bilaterally Cardio: regular rate and rhythm, S1, S2 normal, no murmur, click, rub or gallop GI: soft, non-tender; bowel sounds normal; no masses,  no organomegaly Extremities: extremities normal, atraumatic, no cyanosis or edema He generally looks better  Lab Results:  Results for orders placed or performed during the hospital encounter of 06/26/17 (from the past 48 hour(s))  Glucose, capillary     Status: Abnormal   Collection Time: 07/01/17 11:42 AM  Result Value Ref Range   Glucose-Capillary 198 (H) 65 - 99 mg/dL   Comment 1 Notify RN    Comment 2 Document in Chart   Glucose, capillary     Status: Abnormal   Collection Time: 07/01/17  4:33 PM  Result Value Ref Range   Glucose-Capillary 225 (H) 65 - 99 mg/dL   Comment 1 Notify RN    Comment 2 Document in Chart   Glucose, capillary     Status: Abnormal   Collection Time: 07/01/17  8:55 PM  Result Value Ref Range   Glucose-Capillary 221 (H) 65 - 99 mg/dL   Comment 1 Notify RN    Comment 2 Document in Chart   Basic metabolic panel     Status: Abnormal   Collection Time: 07/02/17  5:24 AM  Result Value Ref Range   Sodium 141 135 - 145 mmol/L   Potassium 4.7 3.5 - 5.1 mmol/L    Chloride 104 101 - 111 mmol/L   CO2 23 22 - 32 mmol/L   Glucose, Bld 182 (H) 65 - 99 mg/dL   BUN 62 (H) 6 - 20 mg/dL   Creatinine, Ser 1.87 (H) 0.61 - 1.24 mg/dL   Calcium 8.9 8.9 - 10.3 mg/dL   GFR calc non Af Amer 35 (L) >60 mL/min   GFR calc Af Amer 41 (L) >60 mL/min    Comment: (NOTE) The eGFR has been calculated using the CKD EPI equation. This calculation has not been validated in all clinical situations. eGFR's persistently <60 mL/min signify possible Chronic Kidney Disease.    Anion gap 14 5 - 15    Comment: Performed at [facility], [address], [place], [place] [address]  Glucose, capillary     Status: Abnormal   Collection Time: 07/02/17  7:56 AM  Result Value Ref Range   Glucose-Capillary 199 (H) 65 - 99 mg/dL  Glucose, capillary     Status: Abnormal   Collection Time: 07/02/17 12:33 PM  Result Value Ref Range   Glucose-Capillary 220 (H) 65 - 99 mg/dL   Comment 1 Notify RN    Comment 2 Document in Chart   Glucose, capillary     Status: Abnormal   Collection Time: 07/02/17  5:29 PM  Result Value  Ref Range   Glucose-Capillary 195 (H) 65 - 99 mg/dL   Comment 1 Notify RN    Comment 2 Document in Chart   Glucose, capillary     Status: Abnormal   Collection Time: 07/02/17  9:16 PM  Result Value Ref Range   Glucose-Capillary 314 (H) 65 - 99 mg/dL   Comment 1 Notify RN    Comment 2 Document in Chart   Glucose, capillary     Status: Abnormal   Collection Time: 07/03/17  7:48 AM  Result Value Ref Range   Glucose-Capillary 188 (H) 65 - 99 mg/dL   Comment 1 Notify RN    Comment 2 Document in Chart     ABGS No results for input(s): PHART, PO2ART, TCO2, HCO3 in the last 72 hours.  Invalid input(s): PCO2 CULTURES Recent Results (from the past 240 hour(s))  Respiratory Panel by PCR     Status: Abnormal   Collection Time: 06/27/17  8:53 AM  Result Value Ref Range Status   Adenovirus NOT DETECTED NOT DETECTED Final   Coronavirus 229E NOT DETECTED NOT DETECTED  Final   Coronavirus HKU1 NOT DETECTED NOT DETECTED Final   Coronavirus NL63 NOT DETECTED NOT DETECTED Final   Coronavirus OC43 NOT DETECTED NOT DETECTED Final   Metapneumovirus DETECTED (A) NOT DETECTED Final   Rhinovirus / Enterovirus NOT DETECTED NOT DETECTED Final   Influenza A NOT DETECTED NOT DETECTED Final   Influenza A H1 NOT DETECTED NOT DETECTED Final   Influenza A H1 2009 NOT DETECTED NOT DETECTED Final   Influenza A H3 NOT DETECTED NOT DETECTED Final   Influenza B NOT DETECTED NOT DETECTED Final   Parainfluenza Virus 1 NOT DETECTED NOT DETECTED Final   Parainfluenza Virus 2 NOT DETECTED NOT DETECTED Final   Parainfluenza Virus 3 NOT DETECTED NOT DETECTED Final   Parainfluenza Virus 4 NOT DETECTED NOT DETECTED Final   Respiratory Syncytial Virus NOT DETECTED NOT DETECTED Final   Bordetella pertussis NOT DETECTED NOT DETECTED Final   Chlamydophila pneumoniae NOT DETECTED NOT DETECTED Final   Mycoplasma pneumoniae NOT DETECTED NOT DETECTED Final    Comment: Performed at [facility], [place]. [address], [place], [place] [address]   Studies/Results: Ct Chest Wo Contrast  Result Date: 07/01/2017 CLINICAL DATA:  Short of breath for 7 days.  Heart failure, EXAM: CT CHEST WITHOUT CONTRAST TECHNIQUE: Multidetector CT imaging of the chest was performed following the standard protocol without IV contrast. COMPARISON:  Radiograph 06/29/2017 FINDINGS: Cardiovascular: Post CABG anatomy Mediastinum/Nodes: No axillary or supraclavicular adenopathy. Mild paratracheal adenopathy with nodes measuring up to 11 mm. No pericardial effusion. Esophagus normal. Lungs/Pleura: In the LEFT lower lobe, there is a branching segmental nodular airspace pattern. Similar pattern in medial RIGHT lower lobe but less severe. Upper Abdomen: Abdominal aortic stent graft noted. Normal adrenal glands Musculoskeletal: No aggressive osseous lesion IMPRESSION: 1. Bibasilar mild airspace disease (greater on the LEFT)  consistent with aspiration pneumonia or mild multifocal bronchopneumonia. 2. Post CABG. Electronically Signed   By: Suzy Bouchard M.D.   On: 07/01/2017 14:22    Medications:  Prior to Admission:  Medications Prior to Admission  Medication Sig Dispense Refill Last Dose  . acetaminophen (TYLENOL) 500 MG tablet Take 1,000 mg by mouth daily as needed for mild pain, moderate pain or headache (pain).    06/26/2017 at morning  . allopurinol (ZYLOPRIM) 100 MG tablet Take 100 mg by mouth daily with supper.    06/26/2017 at Unknown time  .  amLODipine (NORVASC) 5 MG tablet TAKE 1 TABLET BY MOUTH ONCE DAILY. PATIENT NEEDS TO BE SEEN. (Patient taking differently: TAKE 1 TABLET BY MOUTH ONCE DAILY.) 15 tablet 0 06/26/2017 at Unknown time  . aspirin EC 81 MG tablet Take 81 mg by mouth daily.   06/26/2017 at Unknown time  . carvedilol (COREG) 25 MG tablet TAKE (1) TABLET TWICE DAILY. 60 tablet 6 06/26/2017 at 1830  . furosemide (LASIX) 80 MG tablet TAKE (1) TABLET TWICE DAILY. 60 tablet 2 06/26/2017 at Unknown time  . gabapentin (NEURONTIN) 300 MG capsule Take 300 mg by mouth 2 (two) times daily.   06/26/2017 at Unknown time  . glipiZIDE (GLUCOTROL XL) 10 MG 24 hr tablet Take 10 mg by mouth 2 (two) times daily.    06/26/2017 at Unknown time  . isosorbide mononitrate (IMDUR) 60 MG 24 hr tablet Take 60-120 mg by mouth 2 (two) times daily. 120 mg in the am, 66m in the  pm   06/26/2017 at Unknown time  . nitroGLYCERIN (NITROSTAT) 0.4 MG SL tablet PLACE 0.4 MG  UNDER THE TONGUE EVERY 5 MINUTES UP TO 3 DOSES AS NEEDED FOR CHEST PAIN. 25 tablet 3 unknown  . potassium chloride (K-DUR) 10 MEQ tablet TAKE 2 TABLETS BY MOUTH ONCE DAILY. 60 tablet 2 06/26/2017 at Unknown time  . simvastatin (ZOCOR) 40 MG tablet Take 40 mg by mouth daily.   06/26/2017 at Unknown time   Scheduled: . acetylcysteine  3 mL Nebulization TID  . allopurinol  100 mg Oral Q supper  . amLODipine  5 mg Oral Daily  . azithromycin  250 mg Oral QHS  . budesonide  (PULMICORT) nebulizer solution  0.25 mg Nebulization BID  . carvedilol  25 mg Oral BID WC  . gabapentin  300 mg Oral BID  . glipiZIDE  10 mg Oral BID  . guaiFENesin  1,200 mg Oral BID  . guaiFENesin-dextromethorphan  10 mL Oral Q6H  . insulin aspart  0-5 Units Subcutaneous QHS  . insulin aspart  0-9 Units Subcutaneous TID WC  . ipratropium-albuterol  3 mL Nebulization TID  . isosorbide mononitrate  120 mg Oral Daily  . isosorbide mononitrate  60 mg Oral QHS  . mouth rinse  15 mL Mouth Rinse BID  . methylPREDNISolone (SOLU-MEDROL) injection  60 mg Intravenous Q12H  . potassium chloride SA  20 mEq Oral Daily  . simvastatin  40 mg Oral q1800  . sodium chloride flush  3 mL Intravenous Q12H  . tamsulosin  0.4 mg Oral QPC supper   Continuous: . sodium chloride    . ampicillin-sulbactam (UNASYN) IV Stopped (07/03/17 0454)   PCVU:DTHYHOchloride, acetaminophen, nitroGLYCERIN, ondansetron **OR** ondansetron (ZOFRAN) IV, sodium chloride flush  Assesment: He has acute hypoxic respiratory failure probably as a result of some element of COPD from his smoking history as well as bilateral pneumonia.  He is improving. Principal Problem:   Acute respiratory failure with hypoxemia (HCC) Active Problems:   Diabetes type 2, controlled (HCC)   Chronic renal impairment, stage 3 (moderate) (HCC)   Essential hypertension   Cardiomyopathy, ischemic   S/P ICD (internal cardiac defibrillator) procedure   Chronic diastolic CHF (congestive heart failure) ([facility]   Bronchitis    Plan: Continue current treatments    LOS: 6 days   Evangelynn Lochridge L 07/03/2017, 9:52 AM

## 2017-07-03 NOTE — Progress Notes (Signed)
Subjective: Patient reports no significant hematuria.  He is not having flank pain.  Objective: Vital signs in last 24 hours: Temp:  [97.3 F (36.3 C)-97.8 F (36.6 C)] 97.8 F (36.6 C) (02/12 0454) Pulse Rate:  [60-69] 60 (02/12 0454) Resp:  [18-19] 18 (02/12 0454) BP: (158-160)/(66-77) 158/77 (02/12 0454) SpO2:  [92 %-96 %] 94 % (02/12 0700) Weight:  [244 lb 4.3 oz (110.8 kg)-245 lb 13 oz (111.5 kg)] 245 lb 13 oz (111.5 kg) (02/12 0454)  Intake/Output from previous day: 02/11 0701 - 02/12 0700 In: 3825 [P.O.:840; I.V.:6; IV Piggyback:400] Out: 1203 [KNLZJ:6734] Intake/Output this shift: Total I/O In: 483 [P.O.:480; I.V.:3] Out: -   Physical Exam:  Constitutional: Vital signs reviewed. WD WN in NAD   Eyes: PERRL, No scleral icterus.   Pulmonary/Chest: Normal effort Extremities: No cyanosis or edema   Lab Results: No results for input(s): HGB, HCT in the last 72 hours. BMET Recent Labs    07/01/17 0617 07/02/17 0524  NA 141 141  K 4.4 4.7  CL 106 104  CO2 24 23  GLUCOSE 176* 182*  BUN 58* 62*  CREATININE 1.61* 1.87*  CALCIUM 8.9 8.9   No results for input(s): LABPT, INR in the last 72 hours. No results for input(s): LABURIN in the last 72 hours. Results for orders placed or performed during the hospital encounter of 06/26/17  Respiratory Panel by PCR     Status: Abnormal   Collection Time: 06/27/17  8:53 AM  Result Value Ref Range Status   Adenovirus NOT DETECTED NOT DETECTED Final   Coronavirus 229E NOT DETECTED NOT DETECTED Final   Coronavirus HKU1 NOT DETECTED NOT DETECTED Final   Coronavirus NL63 NOT DETECTED NOT DETECTED Final   Coronavirus OC43 NOT DETECTED NOT DETECTED Final   Metapneumovirus DETECTED (A) NOT DETECTED Final   Rhinovirus / Enterovirus NOT DETECTED NOT DETECTED Final   Influenza A NOT DETECTED NOT DETECTED Final   Influenza A H1 NOT DETECTED NOT DETECTED Final   Influenza A H1 2009 NOT DETECTED NOT DETECTED Final   Influenza A H3  NOT DETECTED NOT DETECTED Final   Influenza B NOT DETECTED NOT DETECTED Final   Parainfluenza Virus 1 NOT DETECTED NOT DETECTED Final   Parainfluenza Virus 2 NOT DETECTED NOT DETECTED Final   Parainfluenza Virus 3 NOT DETECTED NOT DETECTED Final   Parainfluenza Virus 4 NOT DETECTED NOT DETECTED Final   Respiratory Syncytial Virus NOT DETECTED NOT DETECTED Final   Bordetella pertussis NOT DETECTED NOT DETECTED Final   Chlamydophila pneumoniae NOT DETECTED NOT DETECTED Final   Mycoplasma pneumoniae NOT DETECTED NOT DETECTED Final    Comment: Performed at [facility], [place]. [address] [place], [place] [address]    Studies/Results: Ct Chest Wo Contrast  Result Date: 07/01/2017 CLINICAL DATA:  Short of breath for 7 days.  Heart failure, EXAM: CT CHEST WITHOUT CONTRAST TECHNIQUE: Multidetector CT imaging of the chest was performed following the standard protocol without IV contrast. COMPARISON:  Radiograph 06/29/2017 FINDINGS: Cardiovascular: Post CABG anatomy Mediastinum/Nodes: No axillary or supraclavicular adenopathy. Mild paratracheal adenopathy with nodes measuring up to 11 mm. No pericardial effusion. Esophagus normal. Lungs/Pleura: In the LEFT lower lobe, there is a branching segmental nodular airspace pattern. Similar pattern in medial RIGHT lower lobe but less severe. Upper Abdomen: Abdominal aortic stent graft noted. Normal adrenal glands Musculoskeletal: No aggressive osseous lesion IMPRESSION: 1. Bibasilar mild airspace disease (greater on the LEFT) consistent with aspiration pneumonia or mild multifocal bronchopneumonia. 2.  Post CABG. Electronically Signed   By: Suzy Bouchard M.D.   On: 07/01/2017 14:22   I reviewed CT images with the patient.  The size of the stone is actually stable over the past couple of years.  It is significantly large, however. Assessment/Plan:   1.  Large right renal pelvic stone, currently asymptomatic  2.  Hematuria, mild bladder abnormality, large  right renal stone as possible sources    We will make sure he is seen in our office in the next couple of weeks for cystoscopy and to review management of his stone   LOS: 6 days   [name] 07/03/2017, 12:08 PM

## 2017-07-04 DIAGNOSIS — J181 Lobar pneumonia, unspecified organism: Secondary | ICD-10-CM

## 2017-07-04 DIAGNOSIS — N183 Chronic kidney disease, stage 3 (moderate): Secondary | ICD-10-CM

## 2017-07-04 DIAGNOSIS — J9601 Acute respiratory failure with hypoxia: Principal | ICD-10-CM

## 2017-07-04 DIAGNOSIS — I5032 Chronic diastolic (congestive) heart failure: Secondary | ICD-10-CM

## 2017-07-04 DIAGNOSIS — J441 Chronic obstructive pulmonary disease with (acute) exacerbation: Secondary | ICD-10-CM

## 2017-07-04 LAB — BASIC METABOLIC PANEL
Anion gap: 9 (ref 5–15)
BUN: 63 mg/dL — ABNORMAL HIGH (ref 6–20)
CHLORIDE: 108 mmol/L (ref 101–111)
CO2: 24 mmol/L (ref 22–32)
Calcium: 8.3 mg/dL — ABNORMAL LOW (ref 8.9–10.3)
Creatinine, Ser: 1.62 mg/dL — ABNORMAL HIGH (ref 0.61–1.24)
GFR, EST AFRICAN AMERICAN: 48 mL/min — AB (ref >60)
GFR, EST NON AFRICAN AMERICAN: 42 mL/min — AB (ref >60)
Glucose, Bld: 117 mg/dL — ABNORMAL HIGH (ref 65–99)
POTASSIUM: 4.5 mmol/L (ref 3.5–5.1)
SODIUM: 141 mmol/L (ref 135–145)

## 2017-07-04 LAB — GLUCOSE, CAPILLARY
GLUCOSE-CAPILLARY: 102 mg/dL — AB (ref 65–99)
GLUCOSE-CAPILLARY: 169 mg/dL — AB (ref 65–99)

## 2017-07-04 MED ORDER — TAMSULOSIN HCL 0.4 MG PO CAPS: 0.4000 mg | ORAL_CAPSULE | Freq: Every day | ORAL | 0 refills | Status: DC

## 2017-07-04 MED ORDER — PREDNISONE 20 MG PO TABS
60.0000 mg | ORAL_TABLET | Freq: Every day | ORAL | Status: DC
Start: 2017-07-05 — End: 2017-07-04

## 2017-07-04 MED ORDER — AMOXICILLIN-POT CLAVULANATE 875-125 MG PO TABS: 1.0000 | ORAL_TABLET | Freq: Two times a day (BID) | ORAL | 0 refills | Status: DC

## 2017-07-04 MED ORDER — AMOXICILLIN-POT CLAVULANATE 875-125 MG PO TABS
1.0000 | ORAL_TABLET | Freq: Two times a day (BID) | ORAL | Status: DC
  Administered 2017-07-04: 1 via ORAL
  Filled 2017-07-04: qty 1

## 2017-07-04 MED ORDER — PREDNISONE 10 MG PO TABS: 60.0000 mg | ORAL_TABLET | Freq: Every day | ORAL | 0 refills | Status: DC

## 2017-07-04 MED ORDER — IPRATROPIUM-ALBUTEROL 0.5-2.5 (3) MG/3ML IN SOLN: 3.0000 mL | Freq: Two times a day (BID) | RESPIRATORY_TRACT | Status: DC

## 2017-07-04 NOTE — Care Management Important Message (Signed)
Important Message  Patient Details  Name: [name] MRN: [id number] Date of Birth: 24-Feb-1946   Medicare Important Message Given:  Yes    Haru Shaff, Chauncey [place], RN 07/04/2017, 10:52 AM

## 2017-07-04 NOTE — Progress Notes (Signed)
Subjective: He says he feels better and hopes to go home later today.  He is still coughing but not bringing anything up.  He has no other new complaints.  Objective: Vital signs in last 24 hours: Temp:  [97.5 F (36.4 C)-98.3 F (36.8 C)] 98.3 F (36.8 C) (02/13 0423) Pulse Rate:  [60-61] 60 (02/13 0423) Resp:  [17-18] 17 (02/13 0423) BP: (152-166)/(64-73) 152/65 (02/13 0423) SpO2:  [91 %-93 %] 93 % (02/13 0748) Weight:  [113 kg (249 lb 1.9 oz)] 113 kg (249 lb 1.9 oz) (02/13 0423) Weight change: 2.2 kg (4 lb 13.6 oz) Last BM Date: 07/03/17  Intake/Output from previous day: 02/12 0701 - 02/13 0700 In: 1166 [P.O.:960; I.V.:6; IV Piggyback:200] Out: 2 [Urine:2]  PHYSICAL EXAM General appearance: alert, cooperative and no distress Resp: rhonchi bilaterally Cardio: regular rate and rhythm, S1, S2 normal, no murmur, click, rub or gallop GI: soft, non-tender; bowel sounds normal; no masses,  no organomegaly Extremities: extremities normal, atraumatic, no cyanosis or edema Skin warm and dry  Lab Results:  Results for orders placed or performed during the hospital encounter of 06/26/17 (from the past 48 hour(s))  Glucose, capillary     Status: Abnormal   Collection Time: 07/02/17 12:33 PM  Result Value Ref Range   Glucose-Capillary 220 (H) 65 - 99 mg/dL   Comment 1 Notify RN    Comment 2 Document in Chart   Glucose, capillary     Status: Abnormal   Collection Time: 07/02/17  5:29 PM  Result Value Ref Range   Glucose-Capillary 195 (H) 65 - 99 mg/dL   Comment 1 Notify RN    Comment 2 Document in Chart   Glucose, capillary     Status: Abnormal   Collection Time: 07/02/17  9:16 PM  Result Value Ref Range   Glucose-Capillary 314 (H) 65 - 99 mg/dL   Comment 1 Notify RN    Comment 2 Document in Chart   Glucose, capillary     Status: Abnormal   Collection Time: 07/03/17  7:48 AM  Result Value Ref Range   Glucose-Capillary 188 (H) 65 - 99 mg/dL   Comment 1 Notify RN    Comment  2 Document in Chart   Glucose, capillary     Status: Abnormal   Collection Time: 07/03/17 11:21 AM  Result Value Ref Range   Glucose-Capillary 189 (H) 65 - 99 mg/dL   Comment 1 Notify RN    Comment 2 Document in Chart   Glucose, capillary     Status: Abnormal   Collection Time: 07/03/17  5:04 PM  Result Value Ref Range   Glucose-Capillary 293 (H) 65 - 99 mg/dL   Comment 1 Notify RN    Comment 2 Document in Chart   Glucose, capillary     Status: Abnormal   Collection Time: 07/03/17  8:05 PM  Result Value Ref Range   Glucose-Capillary 222 (H) 65 - 99 mg/dL   Comment 1 Notify RN    Comment 2 Document in Chart   Basic metabolic panel     Status: Abnormal   Collection Time: 07/04/17  3:57 AM  Result Value Ref Range   Sodium 141 135 - 145 mmol/L   Potassium 4.5 3.5 - 5.1 mmol/L   Chloride 108 101 - 111 mmol/L   CO2 24 22 - 32 mmol/L   Glucose, Bld 117 (H) 65 - 99 mg/dL   BUN 63 (H) 6 - 20 mg/dL   Creatinine, Ser 1.62 (H) 0.61 -  1.24 mg/dL   Calcium 8.3 (L) 8.9 - 10.3 mg/dL   GFR calc non Af Amer 42 (L) >60 mL/min   GFR calc Af Amer 48 (L) >60 mL/min    Comment: (NOTE) The eGFR has been calculated using the CKD EPI equation. This calculation has not been validated in all clinical situations. eGFR's persistently <60 mL/min signify possible Chronic Kidney Disease.    Anion gap 9 5 - 15    Comment: Performed at [facility], [address] [place], [place] [address]  Glucose, capillary     Status: Abnormal   Collection Time: 07/04/17  7:50 AM  Result Value Ref Range   Glucose-Capillary 102 (H) 65 - 99 mg/dL   Comment 1 Notify RN    Comment 2 Document in Chart     ABGS No results for input(s): PHART, PO2ART, TCO2, HCO3 in the last 72 hours.  Invalid input(s): PCO2 CULTURES Recent Results (from the past 240 hour(s))  Respiratory Panel by PCR     Status: Abnormal   Collection Time: 06/27/17  8:53 AM  Result Value Ref Range Status   Adenovirus NOT DETECTED NOT DETECTED  Final   Coronavirus 229E NOT DETECTED NOT DETECTED Final   Coronavirus HKU1 NOT DETECTED NOT DETECTED Final   Coronavirus NL63 NOT DETECTED NOT DETECTED Final   Coronavirus OC43 NOT DETECTED NOT DETECTED Final   Metapneumovirus DETECTED (A) NOT DETECTED Final   Rhinovirus / Enterovirus NOT DETECTED NOT DETECTED Final   Influenza A NOT DETECTED NOT DETECTED Final   Influenza A H1 NOT DETECTED NOT DETECTED Final   Influenza A H1 2009 NOT DETECTED NOT DETECTED Final   Influenza A H3 NOT DETECTED NOT DETECTED Final   Influenza B NOT DETECTED NOT DETECTED Final   Parainfluenza Virus 1 NOT DETECTED NOT DETECTED Final   Parainfluenza Virus 2 NOT DETECTED NOT DETECTED Final   Parainfluenza Virus 3 NOT DETECTED NOT DETECTED Final   Parainfluenza Virus 4 NOT DETECTED NOT DETECTED Final   Respiratory Syncytial Virus NOT DETECTED NOT DETECTED Final   Bordetella pertussis NOT DETECTED NOT DETECTED Final   Chlamydophila pneumoniae NOT DETECTED NOT DETECTED Final   Mycoplasma pneumoniae NOT DETECTED NOT DETECTED Final    Comment: Performed at [facility], [place]. [address], [place], [name] [address]   Studies/Results: No results found.  Medications:  Prior to Admission:  Medications Prior to Admission  Medication Sig Dispense Refill Last Dose  . acetaminophen (TYLENOL) 500 MG tablet Take 1,000 mg by mouth daily as needed for mild pain, moderate pain or headache (pain).    06/26/2017 at morning  . allopurinol (ZYLOPRIM) 100 MG tablet Take 100 mg by mouth daily with supper.    06/26/2017 at Unknown time  . amLODipine (NORVASC) 5 MG tablet TAKE 1 TABLET BY MOUTH ONCE DAILY. PATIENT NEEDS TO BE SEEN. (Patient taking differently: TAKE 1 TABLET BY MOUTH ONCE DAILY.) 15 tablet 0 06/26/2017 at Unknown time  . aspirin EC 81 MG tablet Take 81 mg by mouth daily.   06/26/2017 at Unknown time  . carvedilol (COREG) 25 MG tablet TAKE (1) TABLET TWICE DAILY. 60 tablet 6 06/26/2017 at 1830  . furosemide (LASIX) 80 MG  tablet TAKE (1) TABLET TWICE DAILY. 60 tablet 2 06/26/2017 at Unknown time  . gabapentin (NEURONTIN) 300 MG capsule Take 300 mg by mouth 2 (two) times daily.   06/26/2017 at Unknown time  . glipiZIDE (GLUCOTROL XL) 10 MG 24 hr tablet Take 10 mg by  mouth 2 (two) times daily.    06/26/2017 at Unknown time  . isosorbide mononitrate (IMDUR) 60 MG 24 hr tablet Take 60-120 mg by mouth 2 (two) times daily. 120 mg in the am, 75m in the  pm   06/26/2017 at Unknown time  . nitroGLYCERIN (NITROSTAT) 0.4 MG SL tablet PLACE 0.4 MG  UNDER THE TONGUE EVERY 5 MINUTES UP TO 3 DOSES AS NEEDED FOR CHEST PAIN. 25 tablet 3 unknown  . potassium chloride (K-DUR) 10 MEQ tablet TAKE 2 TABLETS BY MOUTH ONCE DAILY. 60 tablet 2 06/26/2017 at Unknown time  . simvastatin (ZOCOR) 40 MG tablet Take 40 mg by mouth daily.   06/26/2017 at Unknown time   Scheduled: . acetylcysteine  3 mL Nebulization TID  . allopurinol  100 mg Oral Q supper  . amLODipine  5 mg Oral Daily  . budesonide (PULMICORT) nebulizer solution  0.25 mg Nebulization BID  . carvedilol  25 mg Oral BID WC  . gabapentin  300 mg Oral BID  . glipiZIDE  10 mg Oral BID  . guaiFENesin  1,200 mg Oral BID  . guaiFENesin-dextromethorphan  10 mL Oral Q6H  . insulin aspart  0-5 Units Subcutaneous QHS  . insulin aspart  0-9 Units Subcutaneous TID WC  . ipratropium-albuterol  3 mL Nebulization TID  . isosorbide mononitrate  120 mg Oral Daily  . isosorbide mononitrate  60 mg Oral QHS  . mouth rinse  15 mL Mouth Rinse BID  . methylPREDNISolone (SOLU-MEDROL) injection  60 mg Intravenous Daily  . potassium chloride SA  20 mEq Oral Daily  . simvastatin  40 mg Oral q1800  . sodium chloride flush  3 mL Intravenous Q12H  . tamsulosin  0.4 mg Oral QPC supper   Continuous: . sodium chloride    . ampicillin-sulbactam (UNASYN) IV 3 g (07/04/17 0827)   PUMP:NTIRWEchloride, acetaminophen, nitroGLYCERIN, ondansetron **OR** ondansetron (ZOFRAN) IV, sodium chloride flush  Assesment: He  was admitted with acute hypoxic respiratory failure.  He was thought to have viral bronchitis but CT of the chest showed pneumonia possibly from aspiration.  He is improving.  I think he has some element of COPD considering his smoking history and that will need to be worked up as an outpatient with pulmonary function testing  He has cardiomyopathy which is stable Principal Problem:   Acute respiratory failure with hypoxemia ([facility] Active Problems:   Diabetes type 2, controlled ([facility]   Chronic renal impairment, stage 3 (moderate) ([facility]   Essential hypertension   Cardiomyopathy, ischemic   S/P ICD (internal cardiac defibrillator) procedure   Chronic diastolic CHF (congestive heart failure) ([facility]   Bronchitis    Plan: He is okay for discharge from a strictly pulmonary point of view.  If he is able to be discharged he will need to have follow-up CT of the chest to document clearing of the infiltrates because infiltrates were not apparent on plain chest film.  I will plan to sign off.  Thanks for allowing me to see him with you    LOS: 7 days   Rionna Feltes L 07/04/2017, 8:32 AM

## 2017-07-04 NOTE — Progress Notes (Signed)
Pt IV removed, WNL. D/C instructions given to pt. Verbalized understanding. Pt drove self to hospital and will drive self home.

## 2017-07-04 NOTE — Discharge Summary (Signed)
Physician Discharge Summary  [name] Jerrell AGT:[id number] DOB: [birth year] DOA: 06/26/2017  PCP: Doree Albee, MD  Admit date: 06/26/2017 Discharge date: 07/04/2017  Admitted From: Home Disposition:  Home   Recommendations for Outpatient Follow-up:  1. Follow up with PCP in 1-2 weeks 2. Please obtain BMP/CBC in one week    Discharge Condition: Stable CODE STATUS: FULL Diet recommendation: Heart Healthy / Carb Modified   Brief/Interim Summary: 72 year old male with a history of diastolic heart failure, diabetes, hypertension, presents to the hospital with worsening shortness of breath and wheezing.  Found to have viral bronchitis with significant wheezing and acute respiratory failure.  He was started on IV steroids, but was very slow to improve.  Was seen by pulmonology and underwent CT chest indicated underlying aspiration pneumonia.  Started on IV antibiotics.  He has been slowly improving.     Discharge Diagnoses:  1. Acute respiratory failure with hypoxia.  Patient has not made any significant improvement.  Pulmonology consulted and ordered CT chest which indicated possible pneumonia.  Wean down oxygen as tolerated.  Still on 2 L of oxygen this morning.  He was weaned to RA.  Ambulatory pulse ox on day of d/c did not show any oxygen desaturation <88% 2. Acute bronchitis/COPD Exacerbation  Respiratory viral panel positive for Metapneumovirus..  Although he does not have a formal diagnosis of COPD, he does have a long history of tobacco use in the past.  Would likely benefit from outpatient PFTs.  He is currently on intravenous steroids, but overall wheezing has improved.  We will continue to wean down steroids further to once daily. d/c home with prednisone taper 3. Pneumonia.  CT scan indicated multifocal pneumonia versus aspiration pneumonia.  He is currently on IV Unasyn. Discharge home with amox/clav x 3 more days to finish one week tx.  4. Chronic diastolic congestive  heart failure.  Stable.  Appears compensated at this time.  Continue current treatments--discharge home with home dose of lasix 80 mg bid 5. Hematuria with history of kidney stones.  Seen by urology.  Plans are for outpatient follow-up for cystoscopy--started on Flomax 6. Diabetes.  Blood sugars are currently stable.  Continue on sliding scale. Restart glipizide after d/c 7. chronic kidney disease stage III.  Creatinine is currently stable.  baseline creatinine 1.5-1.8.  Stable      Discharge Instructions  Discharge Instructions    Diet - low sodium heart healthy   Complete by:  As directed    Increase activity slowly   Complete by:  As directed      Allergies as of 07/04/2017      Reactions   Ace Inhibitors Swelling   Throat and lips swelled; ended up in ED   Codeine Other (See Comments)    Delirium, hallucinations   Hydromorphone Nausea And Vomiting      Medication List    TAKE these medications   acetaminophen 500 MG tablet Commonly known as:  TYLENOL Take 1,000 mg by mouth daily as needed for mild pain, moderate pain or headache (pain).   allopurinol 100 MG tablet Commonly known as:  ZYLOPRIM Take 100 mg by mouth daily with supper.   amLODipine 5 MG tablet Commonly known as:  NORVASC TAKE 1 TABLET BY MOUTH ONCE DAILY. PATIENT NEEDS TO BE SEEN. What changed:  See the new instructions.   amoxicillin-clavulanate 875-125 MG tablet Commonly known as:  AUGMENTIN Take 1 tablet by mouth every 12 (twelve) hours.   aspirin EC 81 MG  tablet Take 81 mg by mouth daily.   carvedilol 25 MG tablet Commonly known as:  COREG TAKE (1) TABLET TWICE DAILY.   furosemide 80 MG tablet Commonly known as:  LASIX TAKE (1) TABLET TWICE DAILY.   gabapentin 300 MG capsule Commonly known as:  NEURONTIN Take 300 mg by mouth 2 (two) times daily.   glipiZIDE 10 MG 24 hr tablet Commonly known as:  GLUCOTROL XL Take 10 mg by mouth 2 (two) times daily.   isosorbide mononitrate 60 MG  24 hr tablet Commonly known as:  IMDUR Take 60-120 mg by mouth 2 (two) times daily. 120 mg in the am, 60mg  in the  pm   nitroGLYCERIN 0.4 MG SL tablet Commonly known as:  NITROSTAT PLACE 0.4 MG  UNDER THE TONGUE EVERY 5 MINUTES UP TO 3 DOSES AS NEEDED FOR CHEST PAIN.   potassium chloride 10 MEQ tablet Commonly known as:  K-DUR TAKE 2 TABLETS BY MOUTH ONCE DAILY.   predniSONE 10 MG tablet Commonly known as:  DELTASONE Take 6 tablets (60 mg total) by mouth daily with breakfast. And decrease by one tablet daily Start taking on:  07/05/2017   simvastatin 40 MG tablet Commonly known as:  ZOCOR Take 40 mg by mouth daily.   tamsulosin 0.4 MG Caps capsule Commonly known as:  FLOMAX Take 1 capsule (0.4 mg total) by mouth daily after supper.       Allergies  Allergen Reactions  . Ace Inhibitors Swelling    Throat and lips swelled; ended up in ED  . Codeine Other (See Comments)     Delirium, hallucinations  . Hydromorphone Nausea And Vomiting    Consultations:  pulmonary   Procedures/Studies: Dg Chest 2 View  Result Date: 06/26/2017 CLINICAL DATA:  Initial evaluation for acute shortness of breath for 2 days. EXAM: CHEST  2 VIEW COMPARISON:  Prior radiograph from 03/08/2015. FINDINGS: Median sternotomy wires underlying CABG markers and surgical clips noted. Left-sided pacemaker/AICD in place. Stable cardiomegaly. Mediastinal silhouette normal. Lungs normally inflated. Perihilar vascular an interstitial congestion without overt pulmonary edema. No focal infiltrates. Right perihilar scarring is similar to previous. No pleural effusion. No pneumothorax. No acute osseous abnormality. Aortic endograft noted within the upper abdomen. IMPRESSION: 1. Stable cardiomegaly with perihilar and interstitial congestion without frank pulmonary edema. 2. Sequelae of prior CABG. Electronically Signed   By: Jeannine Boga M.D.   On: 06/26/2017 22:36   Ct Chest Wo Contrast  Result Date:  07/01/2017 CLINICAL DATA:  Short of breath for 7 days.  Heart failure, EXAM: CT CHEST WITHOUT CONTRAST TECHNIQUE: Multidetector CT imaging of the chest was performed following the standard protocol without IV contrast. COMPARISON:  Radiograph 06/29/2017 FINDINGS: Cardiovascular: Post CABG anatomy Mediastinum/Nodes: No axillary or supraclavicular adenopathy. Mild paratracheal adenopathy with nodes measuring up to 11 mm. No pericardial effusion. Esophagus normal. Lungs/Pleura: In the LEFT lower lobe, there is a branching segmental nodular airspace pattern. Similar pattern in medial RIGHT lower lobe but less severe. Upper Abdomen: Abdominal aortic stent graft noted. Normal adrenal glands Musculoskeletal: No aggressive osseous lesion IMPRESSION: 1. Bibasilar mild airspace disease (greater on the LEFT) consistent with aspiration pneumonia or mild multifocal bronchopneumonia. 2. Post CABG. Electronically Signed   By: Suzy Bouchard M.D.   On: 07/01/2017 14:22   Dg Chest Port 1 View  Result Date: 06/29/2017 CLINICAL DATA:  Increased shortness of breath. EXAM: PORTABLE CHEST 1 VIEW COMPARISON:  06/26/2017 FINDINGS: Cardiac silhouette is mildly enlarged. Changes from cardiac surgery are stable.  Biventricular cardioverter-defibrillator is also unchanged. No mediastinal or hilar masses. There is vascular congestion without overt pulmonary edema. No evidence of lung consolidation. No convincing pleural effusion or pneumothorax. Skeletal structures are grossly intact. IMPRESSION: 1. Findings are similar to the prior exam allowing for differences in patient positioning and the AP versus is PA and lateral techniques. 2. No overt pulmonary edema. There is cardiomegaly with vascular congestion. No evidence of pneumonia. Electronically Signed   By: Lajean Manes M.D.   On: 06/29/2017 15:04   Ct Renal Stone Study  Result Date: 06/27/2017 CLINICAL DATA:  Acute onset of cough and shortness of breath. Gross hematuria. Lower back  pain, acute onset. EXAM: CT ABDOMEN AND PELVIS WITHOUT CONTRAST TECHNIQUE: Multidetector CT imaging of the abdomen and pelvis was performed following the standard protocol without IV contrast. COMPARISON:  CT of the lumbar spine performed 10/05/2016, and CT of the abdomen and pelvis performed 05/12/2014 FINDINGS: Lower chest: The visualized lung bases are grossly clear. The visualized portions of the mediastinum are unremarkable. Pacemaker leads are partially imaged. Hepatobiliary: Scattered small hypodensities are noted within the liver, measuring up to 1.5 cm in size. The gallbladder is decompressed and unremarkable in appearance. The common bile duct remains normal in caliber. Pancreas: The pancreas is within normal limits. Spleen: The spleen is diminutive and grossly unremarkable in appearance. Adrenals/Urinary Tract: The adrenal glands are unremarkable. Mild left renal atrophy and scarring is noted. Nonspecific perinephric stranding is noted bilaterally. There is no evidence of hydronephrosis. A large nonobstructing 1.9 cm stone is noted at the right renal pelvis. A nonobstructing 6 mm stone is noted at the interpole region of the left kidney. A left renal cyst is noted. Stomach/Bowel: The stomach is unremarkable in appearance. The small bowel is within normal limits. The appendix is normal in caliber, without evidence of appendicitis. Minimal diverticulosis is noted along the descending and sigmoid colon, without evidence of diverticulitis. Vascular/Lymphatic: There is worsening focal angulation of the patient's aortoiliac stent graft, with interval resorption of much of the previously noted aneurysm sac. No definite stent graft retraction is identified, though this is not well assessed without contrast. Superior mesenteric artery and bilateral renal artery stents are also noted. No retroperitoneal or pelvic sidewall lymphadenopathy is seen. Reproductive: The bladder is mildly distended. Anterior bladder wall  thickening may reflect relative decompression or possibly cystitis. A mass is considered less likely, but cannot be entirely excluded. The prostate is borderline enlarged, measuring 4.9 cm in transverse dimension. Other: A moderate anterior abdominal wall hernia is noted, containing only fat. Musculoskeletal: No acute osseous abnormalities are identified. Facet disease noted at the lower lumbar spine. The visualized musculature is unremarkable in appearance. IMPRESSION: 1. Mildly increased anterior bladder wall thickening may reflect relative decompression or possibly cystitis. A mass is considered less likely, but cannot be entirely excluded. Would correlate with the patient's symptoms. 2. Worsening focal angulation of the patient's aortoiliac stent graft, with interval resorption of much of the previously noted aneurysm sac. No definite stent graft retraction identified, though this is not well assessed without contrast. 3. Mild left renal atrophy and scarring. 1.9 cm stone at the right renal pelvis. Nonobstructing 6 mm stone at the interpole region of the left kidney. 4. Nonspecific small hypodensities within the liver, measuring up to 1.5 cm in size. These are similar in appearance to 2015 and likely benign. 5. Borderline enlarged prostate. 6. Moderate anterior abdominal wall hernia, containing only fat. 7. Minimal diverticulosis along the descending and sigmoid  colon, without evidence of diverticulitis. Electronically Signed   By: Garald Balding M.D.   On: 06/27/2017 02:29        Discharge Exam: Vitals:   07/04/17 0423 07/04/17 0748  BP: (!) 152/65   Pulse: 60   Resp: 17   Temp: 98.3 F (36.8 C)   SpO2: 92% 93%   Vitals:   07/03/17 1932 07/03/17 2002 07/04/17 0423 07/04/17 0748  BP:  (!) 166/73 (!) 152/65   Pulse:  60 60   Resp:  18 17   Temp:  98.2 F (36.8 C) 98.3 F (36.8 C)   TempSrc:  Oral Oral   SpO2: 92% 93% 92% 93%  Weight:   113 kg (249 lb 1.9 oz)   Height:         General: Pt is alert, awake, not in acute distress Cardiovascular: RRR, S1/S2 +, no rubs, no gallops Respiratory: CTA bilaterally, no wheezing, no rhonchi Abdominal: Soft, NT, ND, bowel sounds + Extremities: no edema, no cyanosis   The results of significant diagnostics from this hospitalization (including imaging, microbiology, ancillary and laboratory) are listed below for reference.    Significant Diagnostic Studies: Dg Chest 2 View  Result Date: 06/26/2017 CLINICAL DATA:  Initial evaluation for acute shortness of breath for 2 days. EXAM: CHEST  2 VIEW COMPARISON:  Prior radiograph from 03/08/2015. FINDINGS: Median sternotomy wires underlying CABG markers and surgical clips noted. Left-sided pacemaker/AICD in place. Stable cardiomegaly. Mediastinal silhouette normal. Lungs normally inflated. Perihilar vascular an interstitial congestion without overt pulmonary edema. No focal infiltrates. Right perihilar scarring is similar to previous. No pleural effusion. No pneumothorax. No acute osseous abnormality. Aortic endograft noted within the upper abdomen. IMPRESSION: 1. Stable cardiomegaly with perihilar and interstitial congestion without frank pulmonary edema. 2. Sequelae of prior CABG. Electronically Signed   By: Jeannine Boga M.D.   On: 06/26/2017 22:36   Ct Chest Wo Contrast  Result Date: 07/01/2017 CLINICAL DATA:  Short of breath for 7 days.  Heart failure, EXAM: CT CHEST WITHOUT CONTRAST TECHNIQUE: Multidetector CT imaging of the chest was performed following the standard protocol without IV contrast. COMPARISON:  Radiograph 06/29/2017 FINDINGS: Cardiovascular: Post CABG anatomy Mediastinum/Nodes: No axillary or supraclavicular adenopathy. Mild paratracheal adenopathy with nodes measuring up to 11 mm. No pericardial effusion. Esophagus normal. Lungs/Pleura: In the LEFT lower lobe, there is a branching segmental nodular airspace pattern. Similar pattern in medial RIGHT lower lobe but  less severe. Upper Abdomen: Abdominal aortic stent graft noted. Normal adrenal glands Musculoskeletal: No aggressive osseous lesion IMPRESSION: 1. Bibasilar mild airspace disease (greater on the LEFT) consistent with aspiration pneumonia or mild multifocal bronchopneumonia. 2. Post CABG. Electronically Signed   By: Suzy Bouchard M.D.   On: 07/01/2017 14:22   Dg Chest Port 1 View  Result Date: 06/29/2017 CLINICAL DATA:  Increased shortness of breath. EXAM: PORTABLE CHEST 1 VIEW COMPARISON:  06/26/2017 FINDINGS: Cardiac silhouette is mildly enlarged. Changes from cardiac surgery are stable. Biventricular cardioverter-defibrillator is also unchanged. No mediastinal or hilar masses. There is vascular congestion without overt pulmonary edema. No evidence of lung consolidation. No convincing pleural effusion or pneumothorax. Skeletal structures are grossly intact. IMPRESSION: 1. Findings are similar to the prior exam allowing for differences in patient positioning and the AP versus is PA and lateral techniques. 2. No overt pulmonary edema. There is cardiomegaly with vascular congestion. No evidence of pneumonia. Electronically Signed   By: Lajean Manes M.D.   On: 06/29/2017 15:04   Ct Renal Stone  Study  Result Date: 06/27/2017 CLINICAL DATA:  Acute onset of cough and shortness of breath. Gross hematuria. Lower back pain, acute onset. EXAM: CT ABDOMEN AND PELVIS WITHOUT CONTRAST TECHNIQUE: Multidetector CT imaging of the abdomen and pelvis was performed following the standard protocol without IV contrast. COMPARISON:  CT of the lumbar spine performed 10/05/2016, and CT of the abdomen and pelvis performed 05/12/2014 FINDINGS: Lower chest: The visualized lung bases are grossly clear. The visualized portions of the mediastinum are unremarkable. Pacemaker leads are partially imaged. Hepatobiliary: Scattered small hypodensities are noted within the liver, measuring up to 1.5 cm in size. The gallbladder is decompressed  and unremarkable in appearance. The common bile duct remains normal in caliber. Pancreas: The pancreas is within normal limits. Spleen: The spleen is diminutive and grossly unremarkable in appearance. Adrenals/Urinary Tract: The adrenal glands are unremarkable. Mild left renal atrophy and scarring is noted. Nonspecific perinephric stranding is noted bilaterally. There is no evidence of hydronephrosis. A large nonobstructing 1.9 cm stone is noted at the right renal pelvis. A nonobstructing 6 mm stone is noted at the interpole region of the left kidney. A left renal cyst is noted. Stomach/Bowel: The stomach is unremarkable in appearance. The small bowel is within normal limits. The appendix is normal in caliber, without evidence of appendicitis. Minimal diverticulosis is noted along the descending and sigmoid colon, without evidence of diverticulitis. Vascular/Lymphatic: There is worsening focal angulation of the patient's aortoiliac stent graft, with interval resorption of much of the previously noted aneurysm sac. No definite stent graft retraction is identified, though this is not well assessed without contrast. Superior mesenteric artery and bilateral renal artery stents are also noted. No retroperitoneal or pelvic sidewall lymphadenopathy is seen. Reproductive: The bladder is mildly distended. Anterior bladder wall thickening may reflect relative decompression or possibly cystitis. A mass is considered less likely, but cannot be entirely excluded. The prostate is borderline enlarged, measuring 4.9 cm in transverse dimension. Other: A moderate anterior abdominal wall hernia is noted, containing only fat. Musculoskeletal: No acute osseous abnormalities are identified. Facet disease noted at the lower lumbar spine. The visualized musculature is unremarkable in appearance. IMPRESSION: 1. Mildly increased anterior bladder wall thickening may reflect relative decompression or possibly cystitis. A mass is considered  less likely, but cannot be entirely excluded. Would correlate with the patient's symptoms. 2. Worsening focal angulation of the patient's aortoiliac stent graft, with interval resorption of much of the previously noted aneurysm sac. No definite stent graft retraction identified, though this is not well assessed without contrast. 3. Mild left renal atrophy and scarring. 1.9 cm stone at the right renal pelvis. Nonobstructing 6 mm stone at the interpole region of the left kidney. 4. Nonspecific small hypodensities within the liver, measuring up to 1.5 cm in size. These are similar in appearance to 2015 and likely benign. 5. Borderline enlarged prostate. 6. Moderate anterior abdominal wall hernia, containing only fat. 7. Minimal diverticulosis along the descending and sigmoid colon, without evidence of diverticulitis. Electronically Signed   By: Garald Balding M.D.   On: 06/27/2017 02:29     Microbiology: Recent Results (from the past 240 hour(s))  Respiratory Panel by PCR     Status: Abnormal   Collection Time: 06/27/17  8:53 AM  Result Value Ref Range Status   Adenovirus NOT DETECTED NOT DETECTED Final   Coronavirus 229E NOT DETECTED NOT DETECTED Final   Coronavirus HKU1 NOT DETECTED NOT DETECTED Final   Coronavirus NL63 NOT DETECTED NOT DETECTED Final  Coronavirus OC43 NOT DETECTED NOT DETECTED Final   Metapneumovirus DETECTED (A) NOT DETECTED Final   Rhinovirus / Enterovirus NOT DETECTED NOT DETECTED Final   Influenza A NOT DETECTED NOT DETECTED Final   Influenza A H1 NOT DETECTED NOT DETECTED Final   Influenza A H1 2009 NOT DETECTED NOT DETECTED Final   Influenza A H3 NOT DETECTED NOT DETECTED Final   Influenza B NOT DETECTED NOT DETECTED Final   Parainfluenza Virus 1 NOT DETECTED NOT DETECTED Final   Parainfluenza Virus 2 NOT DETECTED NOT DETECTED Final   Parainfluenza Virus 3 NOT DETECTED NOT DETECTED Final   Parainfluenza Virus 4 NOT DETECTED NOT DETECTED Final   Respiratory Syncytial  Virus NOT DETECTED NOT DETECTED Final   Bordetella pertussis NOT DETECTED NOT DETECTED Final   Chlamydophila pneumoniae NOT DETECTED NOT DETECTED Final   Mycoplasma pneumoniae NOT DETECTED NOT DETECTED Final    Comment: Performed at [facility], [place] [address], [place], [place] [address]     Labs: Basic Metabolic Panel: Recent Labs  Lab 06/29/17 0848 06/30/17 0631 07/01/17 0617 07/02/17 0524 07/04/17 0357  NA 138 138 141 141 141  K 4.1 4.4 4.4 4.7 4.5  CL 102 104 106 104 108  CO2 23 23 24 23 24   GLUCOSE 205* 163* 176* 182* 117*  BUN 45* 52* 58* 62* 63*  CREATININE 1.66* 1.63* 1.61* 1.87* 1.62*  CALCIUM 8.9 9.0 8.9 8.9 8.3*   Liver Function Tests: No results for input(s): AST, ALT, ALKPHOS, BILITOT, PROT, ALBUMIN in the last 168 hours. No results for input(s): LIPASE, AMYLASE in the last 168 hours. No results for input(s): AMMONIA in the last 168 hours. CBC: Recent Labs  Lab 06/28/17 0336  WBC 12.8*  HGB 13.0  HCT 39.8  MCV 96.4  PLT 302   Cardiac Enzymes: No results for input(s): CKTOTAL, CKMB, CKMBINDEX, TROPONINI in the last 168 hours. BNP: Invalid input(s): POCBNP CBG: Recent Labs  Lab 07/03/17 1121 07/03/17 1704 07/03/17 2005 07/04/17 0750 07/04/17 1218  GLUCAP 189* 293* 222* 102* 169*    Time coordinating discharge:  Greater than 30 minutes  Signed:  Orson Eva, DO [facility] Pager: [phone] 07/04/2017, 1:55 PM

## 2017-07-04 NOTE — Plan of Care (Signed)
  Activity: Risk for activity intolerance will decrease 07/04/2017 0011 - Progressing by Cassandria Anger, RN   Coping: Level of anxiety will decrease 07/04/2017 0011 - Progressing by Cassandria Anger, RN   Pain Managment: General experience of comfort will improve 07/04/2017 0011 - Progressing by Cassandria Anger, RN   Safety: Ability to remain free from injury will improve 07/04/2017 0011 - Progressing by Cassandria Anger, RN   Activity: Ability to tolerate increased activity will improve 07/04/2017 0011 - Progressing by Cassandria Anger, RN

## 2017-07-05 DIAGNOSIS — J441 Chronic obstructive pulmonary disease with (acute) exacerbation: Secondary | ICD-10-CM

## 2017-07-05 DIAGNOSIS — J181 Lobar pneumonia, unspecified organism: Secondary | ICD-10-CM

## 2017-07-09 ENCOUNTER — Other Ambulatory Visit: Payer: Self-pay

## 2017-07-09 NOTE — Patient Outreach (Addendum)
[facility]) Care Management  07/09/2017  [name] 11/30/1945 [id number]  EMMI- General Discharge RED ON EMMI ALERT Day # 1 Date:  07/07/17 Red Alert Reason:  Read discharge papers? No Scheduled follow-up? No   Telephone call to patient for EMMI general discharge.  No answer.  HIPAA compliant voice message left.    Plan: RN CM will call patient again in one business day.  Jone Baseman, RN, MSN [facility] Care Management Care Management Coordinator Direct Line [phone] Toll Free: [phone]  Fax: [phone]

## 2017-07-10 ENCOUNTER — Other Ambulatory Visit: Payer: Self-pay

## 2017-07-10 NOTE — Patient Outreach (Signed)
[facility]) Care Management  07/10/2017  [name] Emma Mar 17, 1946 [id number]   EMMI- General Discharge RED ON EMMI ALERT Day # 1 Date: 07/07/17 Red Alert Reason: Read discharge papers? No Scheduled follow-up? No   2nd telephone call to patient for [facility] outreach.  No answer.  HIPAA compliant voice message left.    Plan: RN CM will send letter to attempt outreach.  Jone Baseman, RN, MSN [facility] Care Management Care Management Coordinator Direct Line [phone] Toll Free: [phone]  Fax: [phone]

## 2017-07-12 ENCOUNTER — Other Ambulatory Visit: Payer: Self-pay

## 2017-07-12 ENCOUNTER — Encounter (HOSPITAL_COMMUNITY): Payer: Self-pay | Admitting: Emergency Medicine

## 2017-07-12 ENCOUNTER — Emergency Department (HOSPITAL_COMMUNITY)
Admission: EM | Admit: 2017-07-12 | Discharge: 2017-07-12 | Disposition: A | Discharge status: Home / Self Care | Payer: Medicare Other | Attending: Emergency Medicine | Admitting: Emergency Medicine

## 2017-07-12 ENCOUNTER — Emergency Department (HOSPITAL_COMMUNITY): Payer: Medicare Other

## 2017-07-12 DIAGNOSIS — I2510 Atherosclerotic heart disease of native coronary artery without angina pectoris: Secondary | ICD-10-CM | POA: Diagnosis not present

## 2017-07-12 DIAGNOSIS — I130 Hypertensive heart and chronic kidney disease with heart failure and stage 1 through stage 4 chronic kidney disease, or unspecified chronic kidney disease: Secondary | ICD-10-CM | POA: Insufficient documentation

## 2017-07-12 DIAGNOSIS — Z87891 Personal history of nicotine dependence: Secondary | ICD-10-CM | POA: Insufficient documentation

## 2017-07-12 DIAGNOSIS — Z7982 Long term (current) use of aspirin: Secondary | ICD-10-CM | POA: Diagnosis not present

## 2017-07-12 DIAGNOSIS — Z79899 Other long term (current) drug therapy: Secondary | ICD-10-CM | POA: Insufficient documentation

## 2017-07-12 DIAGNOSIS — I5032 Chronic diastolic (congestive) heart failure: Secondary | ICD-10-CM | POA: Insufficient documentation

## 2017-07-12 DIAGNOSIS — N289 Disorder of kidney and ureter, unspecified: Secondary | ICD-10-CM

## 2017-07-12 DIAGNOSIS — R0602 Shortness of breath: Secondary | ICD-10-CM | POA: Diagnosis not present

## 2017-07-12 DIAGNOSIS — J349 Unspecified disorder of nose and nasal sinuses: Secondary | ICD-10-CM | POA: Diagnosis not present

## 2017-07-12 DIAGNOSIS — R509 Fever, unspecified: Secondary | ICD-10-CM | POA: Insufficient documentation

## 2017-07-12 DIAGNOSIS — E1122 Type 2 diabetes mellitus with diabetic chronic kidney disease: Secondary | ICD-10-CM | POA: Diagnosis not present

## 2017-07-12 DIAGNOSIS — N183 Chronic kidney disease, stage 3 (moderate): Secondary | ICD-10-CM | POA: Insufficient documentation

## 2017-07-12 DIAGNOSIS — R079 Chest pain, unspecified: Secondary | ICD-10-CM

## 2017-07-12 DIAGNOSIS — R0789 Other chest pain: Secondary | ICD-10-CM | POA: Diagnosis not present

## 2017-07-12 DIAGNOSIS — I252 Old myocardial infarction: Secondary | ICD-10-CM | POA: Diagnosis not present

## 2017-07-12 DIAGNOSIS — Z951 Presence of aortocoronary bypass graft: Secondary | ICD-10-CM | POA: Diagnosis not present

## 2017-07-12 DIAGNOSIS — Z7984 Long term (current) use of oral hypoglycemic drugs: Secondary | ICD-10-CM | POA: Diagnosis not present

## 2017-07-12 LAB — CBC WITH DIFFERENTIAL/PLATELET
Basophils Absolute: 0 10*3/uL (ref 0.0–0.1)
Basophils Relative: 0 %
Eosinophils Absolute: 0.1 10*3/uL (ref 0.0–0.7)
Eosinophils Relative: 0 %
HCT: 38.4 % — ABNORMAL LOW (ref 39.0–52.0)
HEMOGLOBIN: 12.5 g/dL — AB (ref 13.0–17.0)
LYMPHS ABS: 1.6 10*3/uL (ref 0.7–4.0)
LYMPHS PCT: 9 %
MCH: 31 pg (ref 26.0–34.0)
MCHC: 32.6 g/dL (ref 30.0–36.0)
MCV: 95.3 fL (ref 78.0–100.0)
MONOS PCT: 15 %
Monocytes Absolute: 2.7 10*3/uL (ref 0.1–1.0)
NEUTROS PCT: 76 %
Neutro Abs: 13.5 10*3/uL (ref 1.7–7.7)
Platelets: 211 10*3/uL (ref 150–400)
RBC: 4.03 MIL/uL — AB (ref 4.22–5.81)
RDW: 14.3 % (ref 11.5–15.5)
WBC: 17.9 10*3/uL — AB (ref 4.0–10.5)

## 2017-07-12 LAB — BASIC METABOLIC PANEL
Anion gap: 11 (ref 5–15)
BUN: 35 mg/dL — AB (ref 6–20)
CHLORIDE: 100 mmol/L — AB (ref 101–111)
CO2: 24 mmol/L (ref 22–32)
CREATININE: 2.14 mg/dL — AB (ref 0.61–1.24)
Calcium: 8.3 mg/dL — ABNORMAL LOW (ref 8.9–10.3)
GFR calc Af Amer: 35 mL/min — ABNORMAL LOW (ref >60)
GFR calc non Af Amer: 30 mL/min — ABNORMAL LOW (ref >60)
GLUCOSE: 204 mg/dL — AB (ref 65–99)
POTASSIUM: 3.8 mmol/L (ref 3.5–5.1)
Sodium: 135 mmol/L (ref 135–145)

## 2017-07-12 LAB — I-STAT CG4 LACTIC ACID, ED: Lactic Acid, Venous: 1.18 mmol/L (ref 0.5–1.9)

## 2017-07-12 LAB — I-STAT TROPONIN, ED: Troponin i, poc: 0.03 ng/mL (ref 0.00–0.08)

## 2017-07-12 LAB — INFLUENZA PANEL BY PCR (TYPE A & B)
INFLBPCR: NEGATIVE
Influenza A By PCR: NEGATIVE

## 2017-07-12 MED ORDER — AMOXICILLIN-POT CLAVULANATE 875-125 MG PO TABS: 1.0000 | ORAL_TABLET | Freq: Two times a day (BID) | ORAL | 0 refills | Status: DC

## 2017-07-12 MED ORDER — ACETAMINOPHEN 325 MG PO TABS
650.0000 mg | ORAL_TABLET | Freq: Once | ORAL | Status: AC
Start: 2017-07-12 — End: 2017-07-12
  Administered 2017-07-12: 650 mg via ORAL
  Filled 2017-07-12: qty 2

## 2017-07-12 MED ORDER — AMOXICILLIN-POT CLAVULANATE 875-125 MG PO TABS
1.0000 | ORAL_TABLET | Freq: Once | ORAL | Status: AC
  Administered 2017-07-12: 1 via ORAL
  Filled 2017-07-12: qty 1

## 2017-07-12 NOTE — ED Triage Notes (Signed)
Pt c/o chest pain with sob for a couple days.

## 2017-07-12 NOTE — Discharge Instructions (Signed)
Your x-ray did not show pneumonia.  You are being treated with antibiotic until your blood cultures are completed. Call Dr. Anastasio Champion on Monday to check your culture results. Your kidney function was slightly worse than when you left the hospital.  Call Dr. Lowanda Foster to arrange follow-up this week. Return to ER with any worsening of your symptoms

## 2017-07-12 NOTE — ED Notes (Signed)
Patient transported to X-ray 

## 2017-07-12 NOTE — ED Notes (Signed)
Pt returned from X Ray.

## 2017-07-12 NOTE — ED Provider Notes (Signed)
[facility] EMERGENCY DEPARTMENT Provider Note   CSN: [id number] Arrival date & time: 07/12/17  2119     History   Chief Complaint Chief Complaint  Patient presents with  . Chest Pain    HPI [name] is a 72 y.o. male. CC:  "Congestion in my chest still"  HPI 72 year old male history of AICD, CAD, DHF, recent hospitalization with aspiration pneumonia and CHF exacerbation, Mehta pneumo viral bronchitis.  Was diuresed.  Had CT scan that suggested possible aspiration, treated with Unasyn, discharged on Augmentin 7 days ago.  He is no longer short of breath.  He continues to complain tonight of "congestion down there that just will not come up".  No additional swelling in his legs.  He is noted to have fever upon arrival he was not subjectively aware of.  He does not have body aches, shortness of breath, headache, sore throat, or chest pain or pressure  Past Medical History:  Diagnosis Date  . AAA (abdominal aortic aneurysm) ([place])    Followed by Dr. Donnetta Hutching  . AICD (automatic cardioverter/defibrillator) present   . Angioedema    Secondary to ACE inhibitor  . Arthritis   . Bell palsy   . Carotid artery disease ([place])   . Coronary atherosclerosis of native coronary artery    Multivessel status post CABG  . Essential hypertension, benign   . Gout   . Hernia of abdominal wall   . Hypercholesteremia   . Ischemic cardiomyopathy    LVEF 45-50% 11/2011  . Migraines   . Myocardial infarction ([place])   . Nephrolithiasis   . PAD (peripheral artery disease) ([place])    Followed by Dr. Donnetta Hutching  . Sleep apnea   . Type 2 diabetes mellitus [facility])     Patient Active Problem List   Diagnosis Date Noted  . COPD with acute exacerbation ([place]) 07/05/2017  . Lobar pneumonia ([place]) 07/05/2017  . Chronic diastolic CHF (congestive heart failure) ([place]) 06/27/2017  . Bronchitis 06/27/2017  . Acute respiratory failure with hypoxemia ([place]) 06/27/2017  . ICD (implantable  cardioverter-defibrillator) malfunction 02/19/2015  . Claudication ([place]) 02/04/2015  . S/P ICD (internal cardiac defibrillator) procedure 11/05/2014  . Cardiomyopathy, ischemic 10/20/2014  . LBBB (left bundle branch block) 10/01/2014  . PVD - h/o AAA stent graft, popliteal aneurysm repair 10/01/2014  . Unstable angina ([place]) 09/30/2014  . Acute kidney injury ([place]) 09/30/2014  . Chronic renal impairment, stage 3 (moderate) ([place]) 09/30/2014  . Pre-diabetes 09/30/2014  . Chronic systolic congestive heart failure ([place]) 09/30/2014  . Peripheral neuropathy 09/30/2014  . Essential hypertension 09/30/2014  . Gout 09/30/2014  . HLD (hyperlipidemia) 09/30/2014  . Frequent PVCs   . Diabetes type 2, controlled ([place]) 06/22/2014  . Aneurysm of left femoral artery ([place]) 06/08/2014  . Popliteal artery aneurysm ([place]) 09/23/2013  . Preoperative cardiovascular examination 08/29/2013  . Abdominal aneurysm without mention of rupture 03/05/2012  . ICM-EF 35% by echo [facility] April 2016   . Carotid artery disease ([place])   . Hx of CABG 2001, abnormal but low risk Myoview April 2015 11/26/2009  . ATHEROSLERO NATV ART EXTREM W/INTERMIT CLAUDICAT 09/23/2009  . Aneurysm of artery of lower extremity ([place]) 05/25/2009  . DIABETIC PERIPHERAL NEUROPATHY 03/30/2009  . DYSLIPIDEMIA 02/22/2009  . Chronic systolic heart failure ([place]) 02/22/2009    Past Surgical History:  Procedure Laterality Date  . ABDOMINAL AORTIC ANEURYSM REPAIR  November 19, 2013   [facility] :  Dr. Sammuel Hines  . ABDOMINAL AORTIC ANEURYSM REPAIR  09-07-2014   [facility] [place]  . BI-VENTRICULAR IMPLANTABLE CARDIOVERTER DEFIBRILLATOR  (CRT-D)  11/05/2014  . BYPASS GRAFT POPLITEAL TO POPLITEAL Left 06/08/2014   Procedure: BYPASS GRAFT FEMORAL ARTERY TO ABOVE KNEE POPLITEAL;  Surgeon: Rosetta Posner, MD;  Location: [place];  Service: Vascular;  Laterality: Left;  . CARDIAC CATHETERIZATION N/A 10/02/2014   Procedure: Left Heart Cath and Cors/Grafts Angiography;   Surgeon: Belva Crome, MD;  Location: [place] CV LAB;  Service: Cardiovascular;  Laterality: N/A;  . CARDIAC CATHETERIZATION  "several"  . CATARACT EXTRACTION W/PHACO  03/23/2011   Procedure: CATARACT EXTRACTION PHACO AND INTRAOCULAR LENS PLACEMENT (IOC);  Surgeon: Tonny Branch;  Location: [facility];  Service: Ophthalmology;  Laterality: Left;  CDE: 15.99  . CATARACT EXTRACTION W/PHACO  04/17/2011   Procedure: CATARACT EXTRACTION PHACO AND INTRAOCULAR LENS PLACEMENT (IOC);  Surgeon: Tonny Branch;  Location: [facility];  Service: Ophthalmology;  Laterality: Right;  CDE: 23.45  . CORONARY ARTERY BYPASS GRAFT  2001   LIMA to LAD, SVG to diagonal and ramus, SVG to OM, SVG to AM  . DUPUYTREN CONTRACTURE RELEASE Left 2009  . EP IMPLANTABLE DEVICE N/A 11/05/2014   Procedure: BiV ICD Insertion CRT-D;  Surgeon: Evans Lance, MD;  Location: [place] CV LAB;  Service: Cardiovascular;  Laterality: N/A;  . EP IMPLANTABLE DEVICE N/A 02/24/2015   Procedure: Lead Revision/Repair;  Surgeon: Evans Lance, MD;  Location: [place] CV LAB;  Service: Cardiovascular;  Laterality: N/A;  . EXTRACORPOREAL SHOCK WAVE LITHOTRIPSY  X 1  . EYE SURGERY    . FOOT SURGERY Left 1963   "gangrene"  . ICD LEAD REMOVAL  02/24/2015  . LOWER EXTREMITY ANGIOGRAM Bilateral 04/28/2015   Procedure: Lower Extremity Angiogram;  Surgeon: Rosetta Posner, MD;  Location: [place] CV LAB;  Service: Cardiovascular;  Laterality: Bilateral;  . PERIPHERAL VASCULAR CATHETERIZATION N/A 04/28/2015   Procedure: Abdominal Aortogram;  Surgeon: Rosetta Posner, MD;  Location: [place] CV LAB;  Service: Cardiovascular;  Laterality: N/A;       Home Medications    Prior to Admission medications   Medication Sig Start Date End Date Taking? Authorizing Provider  acetaminophen (TYLENOL) 500 MG tablet Take 1,000 mg by mouth daily as needed for mild pain, moderate pain or headache (pain).     [provider]  allopurinol (ZYLOPRIM) 100 MG  tablet Take 100 mg by mouth daily with supper.     [provider]  amLODipine (NORVASC) 5 MG tablet TAKE 1 TABLET BY MOUTH ONCE DAILY. PATIENT NEEDS TO BE SEEN. Patient taking differently: TAKE 1 TABLET BY MOUTH ONCE DAILY. 10/19/16   Satira Sark, MD  amoxicillin-clavulanate (AUGMENTIN) 875-125 MG tablet Take 1 tablet by mouth every 12 (twelve) hours. 07/04/17   Orson Eva, MD  aspirin EC 81 MG tablet Take 81 mg by mouth daily.    [provider]  carvedilol (COREG) 25 MG tablet TAKE (1) TABLET TWICE DAILY. 06/25/17   Satira Sark, MD  furosemide (LASIX) 80 MG tablet TAKE (1) TABLET TWICE DAILY. 03/14/17   Satira Sark, MD  gabapentin (NEURONTIN) 300 MG capsule Take 300 mg by mouth 2 (two) times daily.    [provider]  glipiZIDE (GLUCOTROL XL) 10 MG 24 hr tablet Take 10 mg by mouth 2 (two) times daily.  05/08/14   [provider]  isosorbide mononitrate (IMDUR) 60 MG 24 hr tablet Take 60-120 mg by mouth 2 (two) times daily. 120 mg  in the am, 60mg  in the  pm    [provider]  nitroGLYCERIN (NITROSTAT) 0.4 MG SL tablet PLACE 0.4 MG  UNDER THE TONGUE EVERY 5 MINUTES UP TO 3 DOSES AS NEEDED FOR CHEST PAIN. 03/12/15   Satira Sark, MD  potassium chloride (K-DUR) 10 MEQ tablet TAKE 2 TABLETS BY MOUTH ONCE DAILY. 03/14/17   Satira Sark, MD  predniSONE (DELTASONE) 10 MG tablet Take 6 tablets (60 mg total) by mouth daily with breakfast. And decrease by one tablet daily 07/05/17   Tat, Shanon Brow, MD  simvastatin (ZOCOR) 40 MG tablet Take 40 mg by mouth daily.    [provider]  tamsulosin (FLOMAX) 0.4 MG CAPS capsule Take 1 capsule (0.4 mg total) by mouth daily after supper. 07/04/17   Orson Eva, MD    Family History Family History  Problem Relation Age of Onset  . Diabetes Mother        Type  I   . Varicose Veins Mother   . Heart disease Father 47       AAA  . AAA (abdominal aortic aneurysm) Father   . Hyperlipidemia  Father   . Hypertension Father     Social History Social History   Tobacco Use  . Smoking status: Former Smoker    Packs/day: 1.00    Years: 25.00    Pack years: 25.00    Types: Cigarettes    Start date: 02/28/1979    Last attempt to quit: 05/23/2007    Years since quitting: 10.1  . Smokeless tobacco: Never Used  Substance Use Topics  . Alcohol use: No    Alcohol/week: 0.0 oz  . Drug use: No     Allergies   Ace inhibitors; Codeine; and Hydromorphone   Review of Systems Review of Systems  Constitutional: Positive for fever. Negative for appetite change, chills, diaphoresis and fatigue.  HENT: Positive for rhinorrhea. Negative for mouth sores, sore throat and trouble swallowing.   Eyes: Negative for visual disturbance.  Respiratory: Negative for cough, chest tightness, shortness of breath and wheezing.        Describes "congestion" in his chest "like I have to cough something up".  Cardiovascular: Negative for chest pain.  Gastrointestinal: Negative for abdominal distention, abdominal pain, diarrhea, nausea and vomiting.  Endocrine: Negative for polydipsia, polyphagia and polyuria.  Genitourinary: Negative for dysuria, frequency and hematuria.  Musculoskeletal: Negative for gait problem.  Skin: Negative for color change, pallor and rash.  Neurological: Negative for dizziness, syncope, light-headedness and headaches.  Hematological: Does not bruise/bleed easily.  Psychiatric/Behavioral: Negative for behavioral problems and confusion.     Physical Exam Updated Vital Signs BP (!) 116/57   Pulse 67   Temp (!) 101.5 F (38.6 C) (Oral)   Resp 17   Ht 6\' 1"  (1.854 m)   Wt 113.4 kg (250 lb)   SpO2 94%   BMI 32.98 kg/m   Physical Exam  Constitutional: He is oriented to person, place, and time. He appears well-developed and well-nourished. No distress.  HENT:  Head: Normocephalic.  Eyes: Conjunctivae are normal. Pupils are equal, round, and reactive to light. No  scleral icterus.  Neck: Normal range of motion. Neck supple. No thyromegaly present.  Cardiovascular: Normal rate and regular rhythm. Exam reveals no gallop and no friction rub.  No murmur heard. He is not tachycardic.  Paced rhythm on the monitor.  Trace symmetric bilateral lower extremity edema.  Pulmonary/Chest: Effort normal and breath sounds normal. No respiratory distress.  He has no wheezes. He has no rales.  Clear lungs.  No crackles or rales.  No focal diminished breath sounds.  No increased work of breathing, hypoxemia, or dyspnea.  Abdominal: Soft. Bowel sounds are normal. He exhibits no distension. There is no tenderness. There is no rebound.  Musculoskeletal: Normal range of motion.  Neurological: He is alert and oriented to person, place, and time.  Skin: Skin is warm and dry. No rash noted.  Psychiatric: He has a normal mood and affect. His behavior is normal.     ED Treatments / Results  Labs (all labs ordered are listed, but only abnormal results are displayed) Labs Reviewed  CBC WITH DIFFERENTIAL/PLATELET - Abnormal; Notable for the following components:      Result Value   WBC 17.9 (*)    RBC 4.03 (*)    Hemoglobin 12.5 (*)    HCT 38.4 (*)    All other components within normal limits  BASIC METABOLIC PANEL - Abnormal; Notable for the following components:   Chloride 100 (*)    Glucose, Bld 204 (*)    BUN 35 (*)    Creatinine, Ser 2.14 (*)    Calcium 8.3 (*)    GFR calc non Af Amer 30 (*)    GFR calc Af Amer 35 (*)    All other components within normal limits  CULTURE, BLOOD (ROUTINE X 2)  CULTURE, BLOOD (ROUTINE X 2)  INFLUENZA PANEL BY PCR (TYPE A & B)  I-STAT TROPONIN, ED  I-STAT CG4 LACTIC ACID, ED    EKG  EKG Interpretation  Date/Time:  Thursday July 12 2017 21:33:20 EST Ventricular Rate:  63 PR Interval:    QRS Duration: 122 QT Interval:  408 QTC Calculation: 418 R Axis:   64 Text Interpretation:  Paced rhythm No further analysis  attempted due to paced rhythm Confirmed by Tanna Furry [phone]) on 07/12/2017 10:02:47 PM       Radiology Dg Chest 2 View  Result Date: 07/12/2017 CLINICAL DATA:  73 year old male with shortness of breath and fever. EXAM: CHEST  2 VIEW COMPARISON:  Chest CT dated 07/01/2017 FINDINGS: There is no focal consolidation, pleural effusion, or pneumothorax. There is mild cardiomegaly. Median sternotomy wires and left pectoral AICD device noted. No acute osseous pathology. IMPRESSION: No active cardiopulmonary disease. Electronically Signed   By: Anner Crete M.D.   On: 07/12/2017 22:50    Procedures Procedures (including critical care time)  Medications Ordered in ED Medications  amoxicillin-clavulanate (AUGMENTIN) 875-125 MG per tablet 1 tablet (not administered)  acetaminophen (TYLENOL) tablet 650 mg (650 mg Oral Given 07/12/17 2224)     Initial Impression / Assessment and Plan / ED Course  I have reviewed the triage vital signs and the nursing notes.  Pertinent labs & imaging results that were available during my care of the patient were reviewed by me and considered in my medical decision making (see chart for details).     EKG Interpretation  Date/Time:  Thursday July 12 2017 21:33:20 EST Ventricular Rate:  63 PR Interval:    QRS Duration: 122 QT Interval:  408 QTC Calculation: 418 R Axis:   64 Text Interpretation:  Paced rhythm No further analysis attempted due to paced rhythm Confirmed by Tanna Furry [phone]) on 07/12/2017 10:02:47 PM   Recheck oral temp 101.5.  Chest x-ray suggests fullness in the right lower lung to my read.  Per radiology no infiltrate noted.  No CHF or effusion.  Lactate is normal.  Blood cultures were obtained.  Influenza swab negative.  Leukocytosis 17.9.  While he feels well.  I think is appropriate for discharge home we will continue him on Augmentin as he did recur only 3 days after completing his p.o. course.  I have given him follow-up with  primary care.  He has slight elevation of his creatinine versus baseline.  He was aggressively diuresed in the hospital.  He has an appointment with Dr. Lowanda Foster his nephrologist next week.  Of asked him to call Monday for recheck lab.  Return to ER with any worsening.  Final Clinical Impressions(s) / ED Diagnoses   Final diagnoses:  Chest pain, unspecified type  Fever, unspecified fever cause  Renal insufficiency    ED Discharge Orders    None       Tanna Furry, MD 07/12/17 2330

## 2017-07-17 LAB — CULTURE, BLOOD (ROUTINE X 2)
CULTURE: NO GROWTH
Culture: NO GROWTH
SPECIAL REQUESTS: ADEQUATE

## 2017-07-20 ENCOUNTER — Ambulatory Visit: Payer: Medicare Other | Admitting: Cardiology

## 2017-07-20 ENCOUNTER — Encounter: Payer: Self-pay | Admitting: Cardiology

## 2017-07-20 VITALS — BP 140/62 | HR 61 | Ht 73.0 in | Wt 237.6 lb

## 2017-07-20 DIAGNOSIS — I255 Ischemic cardiomyopathy: Secondary | ICD-10-CM

## 2017-07-20 DIAGNOSIS — E782 Mixed hyperlipidemia: Secondary | ICD-10-CM | POA: Diagnosis not present

## 2017-07-20 DIAGNOSIS — I25119 Atherosclerotic heart disease of native coronary artery with unspecified angina pectoris: Secondary | ICD-10-CM

## 2017-07-20 NOTE — Patient Instructions (Addendum)

## 2017-07-20 NOTE — Progress Notes (Signed)
Cardiology Office Note  Date: 07/20/2017   ID: [name], DOB 13-Nov-1945, MRN [id number]  PCP: Doree Albee, MD  Primary Cardiologist: Rozann Lesches, MD   Chief Complaint  Patient presents with  . Cardiomyopathy    History of Present Illness: [name] is a medically complex 72 y.o. male last seen in July 2018.  He presents for a follow-up visit.  Getting over a recent diagnosis of pneumonia, he was hospitalized earlier this month at [facility].  He just completed a course of antibiotics this morning.  No coughing, fevers or chills.  He has had some angina symptoms intermittently.  He continues to follow with Dr. Lovena Le in the device clinic, [place] biventricular ICD in place.  Recent interrogation showed biventricular pacing 95% of the time, no device shocks.  Echocardiogram from July of last year showed improvement in LVEF to the range of 50-55%.  Current cardiac regimen includes aspirin, Coreg, Lasix, Imdur, Norvasc, potassium supplements, and Zocor.  Past Medical History:  Diagnosis Date  . AAA (abdominal aortic aneurysm) ([place])    Followed by Dr. Donnetta Hutching  . AICD (automatic cardioverter/defibrillator) present   . Angioedema    Secondary to ACE inhibitor  . Arthritis   . Bell palsy   . Carotid artery disease ([place])   . Coronary atherosclerosis of native coronary artery    Multivessel status post CABG  . Essential hypertension, benign   . Gout   . Hernia of abdominal wall   . Hypercholesteremia   . Ischemic cardiomyopathy    LVEF 45-50% 11/2011  . Migraines   . Myocardial infarction ([place])   . Nephrolithiasis   . PAD (peripheral artery disease) ([place])    Followed by Dr. Donnetta Hutching  . Sleep apnea   . Type 2 diabetes mellitus ([place])     Past Surgical History:  Procedure Laterality Date  . ABDOMINAL AORTIC ANEURYSM REPAIR  November 19, 2013   [facility] :  Dr. Sammuel Hines  . ABDOMINAL AORTIC ANEURYSM REPAIR  09-07-2014   [facility]  . BI-VENTRICULAR  IMPLANTABLE CARDIOVERTER DEFIBRILLATOR  (CRT-D)  11/05/2014  . BYPASS GRAFT POPLITEAL TO POPLITEAL Left 06/08/2014   Procedure: BYPASS GRAFT FEMORAL ARTERY TO ABOVE KNEE POPLITEAL;  Surgeon: Rosetta Posner, MD;  Location: [place];  Service: Vascular;  Laterality: Left;  . CARDIAC CATHETERIZATION N/A 10/02/2014   Procedure: Left Heart Cath and Cors/Grafts Angiography;  Surgeon: Belva Crome, MD;  Location: [place] CV LAB;  Service: Cardiovascular;  Laterality: N/A;  . CARDIAC CATHETERIZATION  "several"  . CATARACT EXTRACTION W/PHACO  03/23/2011   Procedure: CATARACT EXTRACTION PHACO AND INTRAOCULAR LENS PLACEMENT (IOC);  Surgeon: Tonny Branch;  Location: [facility];  Service: Ophthalmology;  Laterality: Left;  CDE: 15.99  . CATARACT EXTRACTION W/PHACO  04/17/2011   Procedure: CATARACT EXTRACTION PHACO AND INTRAOCULAR LENS PLACEMENT (IOC);  Surgeon: Tonny Branch;  Location: [facility];  Service: Ophthalmology;  Laterality: Right;  CDE: 23.45  . CORONARY ARTERY BYPASS GRAFT  2001   LIMA to LAD, SVG to diagonal and ramus, SVG to OM, SVG to AM  . DUPUYTREN CONTRACTURE RELEASE Left 2009  . EP IMPLANTABLE DEVICE N/A 11/05/2014   Procedure: BiV ICD Insertion CRT-D;  Surgeon: Evans Lance, MD;  Location: [place] CV LAB;  Service: Cardiovascular;  Laterality: N/A;  . EP IMPLANTABLE DEVICE N/A 02/24/2015   Procedure: Lead Revision/Repair;  Surgeon: Evans Lance, MD;  Location: [place] CV LAB;  Service:  Cardiovascular;  Laterality: N/A;  . EXTRACORPOREAL SHOCK WAVE LITHOTRIPSY  X 1  . EYE SURGERY    . FOOT SURGERY Left 1963   "gangrene"  . ICD LEAD REMOVAL  02/24/2015  . LOWER EXTREMITY ANGIOGRAM Bilateral 04/28/2015   Procedure: Lower Extremity Angiogram;  Surgeon: Rosetta Posner, MD;  Location: [place] CV LAB;  Service: Cardiovascular;  Laterality: Bilateral;  . PERIPHERAL VASCULAR CATHETERIZATION N/A 04/28/2015   Procedure: Abdominal Aortogram;  Surgeon: Rosetta Posner, MD;  Location: [place] CV LAB;   Service: Cardiovascular;  Laterality: N/A;    Current Outpatient Medications  Medication Sig Dispense Refill  . acetaminophen (TYLENOL) 500 MG tablet Take 1,000 mg by mouth daily as needed for mild pain, moderate pain or headache (pain).     Marland Kitchen allopurinol (ZYLOPRIM) 100 MG tablet Take 100 mg by mouth daily with supper.     Marland Kitchen amLODipine (NORVASC) 5 MG tablet TAKE 1 TABLET BY MOUTH ONCE DAILY. PATIENT NEEDS TO BE SEEN. (Patient taking differently: TAKE 1 TABLET BY MOUTH ONCE DAILY.) 15 tablet 0  . aspirin EC 81 MG tablet Take 81 mg by mouth daily.    . carvedilol (COREG) 25 MG tablet TAKE (1) TABLET TWICE DAILY. 60 tablet 6  . furosemide (LASIX) 80 MG tablet TAKE (1) TABLET TWICE DAILY. 60 tablet 2  . gabapentin (NEURONTIN) 300 MG capsule Take 300 mg by mouth 2 (two) times daily.    Marland Kitchen glipiZIDE (GLUCOTROL XL) 10 MG 24 hr tablet Take 10 mg by mouth 2 (two) times daily.     . isosorbide mononitrate (IMDUR) 60 MG 24 hr tablet Take 60-120 mg by mouth 2 (two) times daily. 120 mg in the am, 60mg  in the  pm    . nitroGLYCERIN (NITROSTAT) 0.4 MG SL tablet PLACE 0.4 MG  UNDER THE TONGUE EVERY 5 MINUTES UP TO 3 DOSES AS NEEDED FOR CHEST PAIN. 25 tablet 3  . potassium chloride (K-DUR) 10 MEQ tablet TAKE 2 TABLETS BY MOUTH ONCE DAILY. 60 tablet 2  . simvastatin (ZOCOR) 40 MG tablet Take 40 mg by mouth daily.    . tamsulosin (FLOMAX) 0.4 MG CAPS capsule Take 1 capsule (0.4 mg total) by mouth daily after supper. 30 capsule 0   No current facility-administered medications for this visit.    Allergies:  Ace inhibitors; Codeine; and Hydromorphone   Social History: The patient  reports that he quit smoking about 10 years ago. His smoking use included cigarettes. He started smoking about 38 years ago. He has a 25.00 pack-year smoking history. he has never used smokeless tobacco. He reports that he does not drink alcohol or use drugs.  ROS:  Please see the history of present illness. Otherwise, complete review of  systems is positive for chronic fatigue.  All other systems are reviewed and negative.   Physical Exam: VS:  BP 140/62   Pulse 61   Ht 6\' 1"  (1.854 m)   Wt 237 lb 9.6 oz (107.8 kg)   SpO2 97%   BMI 31.35 kg/m , BMI Body mass index is 31.35 kg/m.  Wt Readings from Last 3 Encounters:  07/20/17 237 lb 9.6 oz (107.8 kg)  07/12/17 250 lb (113.4 kg)  07/04/17 249 lb 1.9 oz (113 kg)    General: Obese male, appears comfortable at rest. HEENT: Conjunctiva and lids normal, oropharynx clear. Neck: Supple, no elevated JVP or carotid bruits, no thyromegaly. Lungs: No crackles or rhonchi, nonlabored breathing at rest. Cardiac: Regular rate and rhythm,  no S3 or significant systolic murmur, no pericardial rub. Abdomen: Soft, nontender, bowel sounds present. Extremities: Mild ankle edema, distal pulses 2+. Skin: Warm and dry. Musculoskeletal: No kyphosis. Neuropsychiatric: Alert and oriented x3, affect grossly appropriate.  ECG: I personally reviewed the tracing from 07/12/2017 which showed a ventricular paced rhythm with atrial sensing.  Recent Labwork: 06/29/2017: B Natriuretic Peptide 429.0 07/12/2017: BUN 35; Creatinine, Ser 2.14; Hemoglobin 12.5; Platelets 211; Potassium 3.8; Sodium 135     Component Value Date/Time   CHOL 118 10/16/2014 0715   TRIG 100 10/16/2014 0715   HDL 33 (L) 10/16/2014 0715   CHOLHDL 3.6 10/16/2014 0715   VLDL 20 10/16/2014 0715   LDLCALC 65 10/16/2014 0715    Other Studies Reviewed Today:   Echocardiogram 12/06/2016: Study Conclusions  - Left ventricle: The cavity size was normal. Wall thickness was   normal. Systolic function was normal. The estimated ejection   fraction was in the range of 50% to 55%. Wall motion was normal;   there were no regional wall motion abnormalities. Doppler   parameters are consistent with abnormal left ventricular   relaxation (grade 1 diastolic dysfunction). - Aortic valve: Mildly to moderately calcified annulus.  Trileaflet;   moderately thickened leaflets. Valve area (VTI): 2.89 cm^2. Valve   area (Vmax): 2.32 cm^2. Valve area (Vmean): 2.47 cm^2.  Assessment and Plan:  1.  Ischemic cardiomyopathy, LVEF 50-55% by follow-up echocardiogram in July of last year.  Plan is to continue current medical therapy and observation for now.  2.  Recent diagnosis of pneumonia, completed antibiotic course this morning.  Keep follow-up with PCP.  3.  CAD status post CABG with graft disease.  He reports intermittent angina symptoms but overall has been stable on medical therapy.  4.  Mixed hyperlipidemia, on Zocor.  Current medicines were reviewed with the patient today.  Disposition: Follow-up in 6 months.  Signed, Satira Sark, MD, [facility] 07/20/2017 3:57 PM    [place] [facility] [place], [address], [place] [address] Phone: [phone]; Fax: [phone]

## 2017-07-23 ENCOUNTER — Other Ambulatory Visit: Payer: Self-pay

## 2017-07-23 NOTE — Patient Outreach (Signed)
[facility]) Care Management  07/23/2017  [name] 26-Jan-1946 [id number]   EMMI-General Discharge RED ON EMMI ALERT Day #1 Date:07/07/17 Red Alert Reason: Read discharge papers?No Scheduled follow-up?No  3rd telephone call to patient for EMMI follow up.  No answer. HIPAA compliant voice message left.  Plan: RN CM will close case and notify care management assistant of case status.  Jone Baseman, RN, MSN [facility] Care Management Care Management Coordinator Direct Line [phone] Toll Free: [phone]  Fax: [phone]

## 2017-07-25 ENCOUNTER — Other Ambulatory Visit: Payer: Self-pay | Admitting: *Deleted

## 2017-07-25 MED ORDER — FUROSEMIDE 80 MG PO TABS: 80.0000 mg | ORAL_TABLET | Freq: Two times a day (BID) | ORAL | 3 refills | Status: DC

## 2017-07-25 MED ORDER — POTASSIUM CHLORIDE ER 10 MEQ PO TBCR: 20.0000 meq | EXTENDED_RELEASE_TABLET | Freq: Every day | ORAL | 3 refills | Status: DC

## 2017-07-30 ENCOUNTER — Other Ambulatory Visit: Payer: Self-pay | Admitting: Internal Medicine

## 2017-08-06 DIAGNOSIS — I129 Hypertensive chronic kidney disease with stage 1 through stage 4 chronic kidney disease, or unspecified chronic kidney disease: Secondary | ICD-10-CM | POA: Diagnosis not present

## 2017-08-06 DIAGNOSIS — N183 Chronic kidney disease, stage 3 (moderate): Secondary | ICD-10-CM | POA: Diagnosis not present

## 2017-08-06 DIAGNOSIS — E559 Vitamin D deficiency, unspecified: Secondary | ICD-10-CM | POA: Diagnosis not present

## 2017-08-06 DIAGNOSIS — D509 Iron deficiency anemia, unspecified: Secondary | ICD-10-CM | POA: Diagnosis not present

## 2017-08-06 DIAGNOSIS — R809 Proteinuria, unspecified: Secondary | ICD-10-CM | POA: Diagnosis not present

## 2017-08-06 DIAGNOSIS — Z79899 Other long term (current) drug therapy: Secondary | ICD-10-CM | POA: Diagnosis not present

## 2017-08-07 DIAGNOSIS — N183 Chronic kidney disease, stage 3 (moderate): Secondary | ICD-10-CM | POA: Diagnosis not present

## 2017-08-07 DIAGNOSIS — N200 Calculus of kidney: Secondary | ICD-10-CM | POA: Diagnosis not present

## 2017-08-07 DIAGNOSIS — I10 Essential (primary) hypertension: Secondary | ICD-10-CM | POA: Diagnosis not present

## 2017-08-07 DIAGNOSIS — E1121 Type 2 diabetes mellitus with diabetic nephropathy: Secondary | ICD-10-CM | POA: Diagnosis not present

## 2017-08-07 DIAGNOSIS — M9080 Osteopathy in diseases classified elsewhere, unspecified site: Secondary | ICD-10-CM | POA: Diagnosis not present

## 2017-08-15 ENCOUNTER — Ambulatory Visit (INDEPENDENT_AMBULATORY_CARE_PROVIDER_SITE_OTHER): Payer: Medicare Other | Admitting: Urology

## 2017-08-15 ENCOUNTER — Other Ambulatory Visit: Payer: Self-pay | Admitting: Urology

## 2017-08-15 ENCOUNTER — Ambulatory Visit (HOSPITAL_COMMUNITY)
Admission: RE | Admit: 2017-08-15 | Discharge: 2017-08-15 | Disposition: A | Discharge status: Home / Self Care | Payer: Medicare Other | Source: Ambulatory Visit | Attending: Urology | Admitting: Urology

## 2017-08-15 DIAGNOSIS — N200 Calculus of kidney: Secondary | ICD-10-CM | POA: Diagnosis not present

## 2017-08-16 ENCOUNTER — Telehealth: Payer: Self-pay | Admitting: Cardiology

## 2017-08-16 NOTE — Telephone Encounter (Signed)
NEW MESSAGE       [facility] Pre-operative Risk Assessment    Request for surgical clearance:  1. What type of surgery is being performed? Lipotripsy  2. When is this surgery scheduled? TBD  3. What type of clearance is required (medical clearance vs. Pharmacy clearance to hold med vs. Both)? Both  4. Are there any medications that need to be held prior to surgery and how long?n/a  5. Practice name and name of physician performing surgery? [facility], Dr Nicolette Bang  6. What is your office phone and fax number? Phone [phone] ext [phone] , fax [phone] attn: Selita  7. Anesthesia type (None, local, MAC, general) ? n/a   Laurier Nancy 08/16/2017, 12:47 PM  _________________________________________________________________   (provider comments below)

## 2017-08-17 NOTE — Telephone Encounter (Signed)
   Primary Cardiologist: No primary care provider on file.  Chart reviewed as part of pre-operative protocol coverage. Given past medical history and time since last visit, based on ACC/AHA guidelines, Montrez M Balogh would be at acceptable risk for the planned procedure without further cardiovascular testing.   I will route this recommendation to the requesting party via Epic fax function and remove from pre-op pool.  Please call with questions.  Kerin Ransom, PA-C 08/17/2017, 3:29 PM

## 2017-08-17 NOTE — Telephone Encounter (Signed)
   Primary Cardiologist: No primary care provider on file.  Chart reviewed as part of pre-operative protocol coverage. Given past medical history and time since last visit, based on ACC/AHA guidelines, Mikaeel M Wissink would be at acceptable risk for the planned procedure without further cardiovascular testing.   I will route this recommendation to the requesting party via Epic fax function and remove from pre-op pool.  Please call with questions.  Kerin Ransom, PA-C 08/17/2017, 3:30 PM

## 2017-08-22 ENCOUNTER — Other Ambulatory Visit: Payer: Self-pay | Admitting: Urology

## 2017-08-23 ENCOUNTER — Ambulatory Visit (INDEPENDENT_AMBULATORY_CARE_PROVIDER_SITE_OTHER): Payer: Medicare Other | Admitting: *Deleted

## 2017-08-23 DIAGNOSIS — I255 Ischemic cardiomyopathy: Secondary | ICD-10-CM | POA: Diagnosis not present

## 2017-08-23 DIAGNOSIS — I5022 Chronic systolic (congestive) heart failure: Secondary | ICD-10-CM

## 2017-08-23 NOTE — Progress Notes (Signed)
Remote ICD transmission.   

## 2017-08-29 ENCOUNTER — Encounter: Payer: Self-pay | Admitting: Cardiology

## 2017-09-03 ENCOUNTER — Other Ambulatory Visit: Payer: Self-pay

## 2017-09-03 ENCOUNTER — Encounter (HOSPITAL_COMMUNITY): Payer: Self-pay | Admitting: *Deleted

## 2017-09-06 ENCOUNTER — Ambulatory Visit (HOSPITAL_COMMUNITY): Admission: RE | Admit: 2017-09-06 | Payer: Medicare Other | Source: Ambulatory Visit | Admitting: Urology

## 2017-09-06 SURGERY — LITHOTRIPSY, ESWL
Anesthesia: Monitor Anesthesia Care | Laterality: Right

## 2017-09-13 LAB — CUP PACEART REMOTE DEVICE CHECK
Battery Remaining Longevity: 37 mo
Battery Remaining Percentage: 56 %
Battery Voltage: 2.93 V
Brady Statistic AP VP Percent: 42 %
Brady Statistic AP VS Percent: 1 %
Brady Statistic AS VP Percent: 48 %
Brady Statistic AS VS Percent: 2.8 %
HIGH POWER IMPEDANCE MEASURED VALUE: 70 Ohm
HighPow Impedance: 70 Ohm
Implantable Lead Implant Date: 20161005
Implantable Lead Location: 753858
Implantable Lead Location: 753860
Lead Channel Impedance Value: 400 Ohm
Lead Channel Impedance Value: 750 Ohm
Lead Channel Pacing Threshold Amplitude: 2 V
Lead Channel Pacing Threshold Pulse Width: 0.5 ms
Lead Channel Pacing Threshold Pulse Width: 1 ms
Lead Channel Sensing Intrinsic Amplitude: 11.6 mV
Lead Channel Setting Pacing Amplitude: 2 V
Lead Channel Setting Sensing Sensitivity: 0.5 mV
MDC IDC LEAD IMPLANT DT: 20160616
MDC IDC LEAD IMPLANT DT: 20161005
MDC IDC LEAD LOCATION: 753859
MDC IDC MSMT LEADCHNL RA IMPEDANCE VALUE: 480 Ohm
MDC IDC MSMT LEADCHNL RA PACING THRESHOLD AMPLITUDE: 1 V
MDC IDC MSMT LEADCHNL RA PACING THRESHOLD PULSEWIDTH: 0.5 ms
MDC IDC MSMT LEADCHNL RA SENSING INTR AMPL: 1.5 mV
MDC IDC MSMT LEADCHNL RV PACING THRESHOLD AMPLITUDE: 0.625 V
MDC IDC PG IMPLANT DT: 20160616
MDC IDC PG SERIAL: 7288352
MDC IDC SESS DTM: 20190404060017
MDC IDC SET LEADCHNL LV PACING AMPLITUDE: 3 V
MDC IDC SET LEADCHNL LV PACING PULSEWIDTH: 1 ms
MDC IDC SET LEADCHNL RV PACING AMPLITUDE: 2 V
MDC IDC SET LEADCHNL RV PACING PULSEWIDTH: 0.5 ms
MDC IDC STAT BRADY RA PERCENT PACED: 37 %

## 2017-10-16 DIAGNOSIS — E119 Type 2 diabetes mellitus without complications: Secondary | ICD-10-CM | POA: Diagnosis not present

## 2017-10-16 DIAGNOSIS — I10 Essential (primary) hypertension: Secondary | ICD-10-CM | POA: Diagnosis not present

## 2017-10-16 DIAGNOSIS — I129 Hypertensive chronic kidney disease with stage 1 through stage 4 chronic kidney disease, or unspecified chronic kidney disease: Secondary | ICD-10-CM | POA: Diagnosis not present

## 2017-10-17 DIAGNOSIS — E785 Hyperlipidemia, unspecified: Secondary | ICD-10-CM | POA: Diagnosis not present

## 2017-10-17 DIAGNOSIS — I10 Essential (primary) hypertension: Secondary | ICD-10-CM | POA: Diagnosis not present

## 2017-10-17 DIAGNOSIS — E559 Vitamin D deficiency, unspecified: Secondary | ICD-10-CM | POA: Diagnosis not present

## 2017-10-17 DIAGNOSIS — E119 Type 2 diabetes mellitus without complications: Secondary | ICD-10-CM | POA: Diagnosis not present

## 2017-11-13 ENCOUNTER — Encounter (HOSPITAL_COMMUNITY): Payer: Medicare Other

## 2017-11-13 ENCOUNTER — Ambulatory Visit: Payer: Medicare Other | Admitting: Vascular Surgery

## 2017-11-23 ENCOUNTER — Ambulatory Visit (INDEPENDENT_AMBULATORY_CARE_PROVIDER_SITE_OTHER): Payer: Medicare Other | Admitting: *Deleted

## 2017-11-23 DIAGNOSIS — I255 Ischemic cardiomyopathy: Secondary | ICD-10-CM | POA: Diagnosis not present

## 2017-11-23 DIAGNOSIS — I5022 Chronic systolic (congestive) heart failure: Secondary | ICD-10-CM

## 2017-11-23 NOTE — Progress Notes (Signed)
Remote ICD transmission.   

## 2017-12-18 ENCOUNTER — Other Ambulatory Visit: Payer: Self-pay

## 2017-12-18 NOTE — Patient Outreach (Signed)
[facility]) Care Management  12/18/2017  Varun Jourdan Ladson Jan 24, 1946 [id number]   Medication Adherence call to [name] left a message for patient to call back patient is due on Glipizide xl 10 mg. [facility] said patient has not pick up since May. Mr. Skillin is showing past due under [place].   [place] Management Direct Dial [phone]  Fax [phone] [email or web address]

## 2017-12-20 LAB — CUP PACEART REMOTE DEVICE CHECK
Battery Remaining Percentage: 52 %
Brady Statistic AP VS Percent: 1 %
Brady Statistic AS VP Percent: 46 %
Brady Statistic AS VS Percent: 1.7 %
Brady Statistic RA Percent Paced: 41 %
Date Time Interrogation Session: 20190704063719
HighPow Impedance: 70 Ohm
HighPow Impedance: 70 Ohm
Implantable Lead Implant Date: 20161005
Implantable Lead Implant Date: 20161005
Implantable Lead Location: 753859
Implantable Lead Model: 7122
Lead Channel Impedance Value: 400 Ohm
Lead Channel Impedance Value: 480 Ohm
Lead Channel Pacing Threshold Amplitude: 1 V
Lead Channel Pacing Threshold Pulse Width: 0.5 ms
Lead Channel Pacing Threshold Pulse Width: 1 ms
Lead Channel Sensing Intrinsic Amplitude: 1.4 mV
Lead Channel Sensing Intrinsic Amplitude: 11.9 mV
Lead Channel Setting Pacing Amplitude: 2 V
Lead Channel Setting Pacing Amplitude: 2 V
Lead Channel Setting Pacing Amplitude: 3.25 V
Lead Channel Setting Pacing Pulse Width: 0.5 ms
Lead Channel Setting Pacing Pulse Width: 1 ms
MDC IDC LEAD IMPLANT DT: 20160616
MDC IDC LEAD LOCATION: 753858
MDC IDC LEAD LOCATION: 753860
MDC IDC MSMT BATTERY REMAINING LONGEVITY: 34 mo
MDC IDC MSMT BATTERY VOLTAGE: 2.93 V
MDC IDC MSMT LEADCHNL LV IMPEDANCE VALUE: 700 Ohm
MDC IDC MSMT LEADCHNL LV PACING THRESHOLD AMPLITUDE: 2.25 V
MDC IDC MSMT LEADCHNL RA PACING THRESHOLD PULSEWIDTH: 0.5 ms
MDC IDC MSMT LEADCHNL RV PACING THRESHOLD AMPLITUDE: 0.625 V
MDC IDC PG IMPLANT DT: 20160616
MDC IDC PG SERIAL: 7288352
MDC IDC SET LEADCHNL RV SENSING SENSITIVITY: 0.5 mV
MDC IDC STAT BRADY AP VP PERCENT: 47 %

## 2017-12-27 ENCOUNTER — Telehealth: Payer: Self-pay

## 2017-12-27 NOTE — Telephone Encounter (Signed)
the patient called in stating he woke from an episode. He having some shortening of breathe, and overall not feeling very well. I had the pt send in a manual remote transmission and let him talk with the device tech nurse.

## 2017-12-27 NOTE — Telephone Encounter (Signed)
Remote transmission reviewed. Presenting rhythm: ApBp, AsBp w/(1) PVC. (2) AT/AF episodes, max dur. 12 sec, last 08/03/17. No ventricular arrhythmias recorded. 92%BiVpacing - 5.6% PVC burden. Stable histogram, stable lead measurements, stable thoracic impedance. Normal device function.  Patient informed that remote is stable and does not explain episode from 0500. Will forward to triage.

## 2017-12-27 NOTE — Telephone Encounter (Signed)
[id number] 

## 2018-01-02 NOTE — Telephone Encounter (Signed)
Called pt. No answer. Voicemail full

## 2018-01-15 ENCOUNTER — Ambulatory Visit (HOSPITAL_COMMUNITY)
Admission: RE | Admit: 2018-01-15 | Discharge: 2018-01-15 | Disposition: A | Discharge status: Home / Self Care | Payer: Medicare Other | Source: Ambulatory Visit | Attending: Vascular Surgery | Admitting: Vascular Surgery

## 2018-01-15 ENCOUNTER — Other Ambulatory Visit: Payer: Self-pay | Admitting: Vascular Surgery

## 2018-01-15 ENCOUNTER — Encounter: Payer: Self-pay | Admitting: Vascular Surgery

## 2018-01-15 ENCOUNTER — Ambulatory Visit (INDEPENDENT_AMBULATORY_CARE_PROVIDER_SITE_OTHER)
Admission: RE | Admit: 2018-01-15 | Discharge: 2018-01-15 | Disposition: A | Discharge status: Home / Self Care | Payer: Medicare Other | Source: Ambulatory Visit | Attending: Vascular Surgery | Admitting: Vascular Surgery

## 2018-01-15 ENCOUNTER — Ambulatory Visit (INDEPENDENT_AMBULATORY_CARE_PROVIDER_SITE_OTHER): Payer: Medicare Other | Admitting: Vascular Surgery

## 2018-01-15 VITALS — BP 139/69 | HR 59 | Temp 97.3°F | Resp 18 | Ht 73.0 in | Wt 243.0 lb

## 2018-01-15 DIAGNOSIS — I739 Peripheral vascular disease, unspecified: Secondary | ICD-10-CM

## 2018-01-15 DIAGNOSIS — E785 Hyperlipidemia, unspecified: Secondary | ICD-10-CM | POA: Diagnosis not present

## 2018-01-15 DIAGNOSIS — I724 Aneurysm of artery of lower extremity: Secondary | ICD-10-CM

## 2018-01-15 DIAGNOSIS — H539 Unspecified visual disturbance: Secondary | ICD-10-CM | POA: Diagnosis not present

## 2018-01-15 DIAGNOSIS — Z89412 Acquired absence of left great toe: Secondary | ICD-10-CM | POA: Insufficient documentation

## 2018-01-15 DIAGNOSIS — Z95828 Presence of other vascular implants and grafts: Secondary | ICD-10-CM | POA: Diagnosis not present

## 2018-01-15 DIAGNOSIS — I10 Essential (primary) hypertension: Secondary | ICD-10-CM | POA: Insufficient documentation

## 2018-01-15 DIAGNOSIS — I6523 Occlusion and stenosis of bilateral carotid arteries: Secondary | ICD-10-CM | POA: Diagnosis not present

## 2018-01-15 DIAGNOSIS — G459 Transient cerebral ischemic attack, unspecified: Secondary | ICD-10-CM

## 2018-01-15 DIAGNOSIS — E1151 Type 2 diabetes mellitus with diabetic peripheral angiopathy without gangrene: Secondary | ICD-10-CM | POA: Insufficient documentation

## 2018-01-15 NOTE — Progress Notes (Signed)
Vascular and Vein Specialist of [facility]  Patient name: [name] MRN: [id number] DOB: July 04, 1945 Sex: male  REASON FOR VISIT: Follow-up diffuse peripheral vascular disease  HPI: [name] is a 72 y.o. male today for follow-up.  He has diffuse peripheral vascular disease.  He had undergone bypass around a distal superficial femoral artery aneurysm with saphenous vein.  His main complaint continues to be of pain and tightness that begins in his low back extends into both hips and both thighs and legs and limits his ability to walk.  He will that this may be some worse.  He does have a history of a fenestrated stent graft at [facility].  He did have an episode several weeks ago with total blindness in his right eye which lasted approximately 15 minutes and resolved.  We did obtain a carotid duplex today to rule out proximal source of emboli  Past Medical History:  Diagnosis Date  . AAA (abdominal aortic aneurysm) ([place])    Followed by Dr. Donnetta Hutching  . AICD (automatic cardioverter/defibrillator) present   . Angioedema    Secondary to ACE inhibitor  . Arthritis   . Bell palsy   . Carotid artery disease ([place])   . Coronary atherosclerosis of native coronary artery    Multivessel status post CABG  . Essential hypertension, benign   . Gout   . Hernia of abdominal wall   . Hypercholesteremia   . Ischemic cardiomyopathy    LVEF 45-50% 11/2011  . Migraines   . Myocardial infarction ([place])   . Nephrolithiasis   . PAD (peripheral artery disease) ([place])    Followed by Dr. Donnetta Hutching  . Sleep apnea   . Type 2 diabetes mellitus (HCC)     Family History  Problem Relation Age of Onset  . Diabetes Mother        Type  I   . Varicose Veins Mother   . Heart disease Father 1       AAA  . AAA (abdominal aortic aneurysm) Father   . Hyperlipidemia Father   . Hypertension Father     SOCIAL HISTORY: Social History   Tobacco Use    . Smoking status: Former Smoker    Packs/day: 1.00    Years: 25.00    Pack years: 25.00    Types: Cigarettes    Start date: 02/28/1979    Last attempt to quit: 05/23/2007    Years since quitting: 10.6  . Smokeless tobacco: Never Used  Substance Use Topics  . Alcohol use: No    Alcohol/week: 0.0 standard drinks    Allergies  Allergen Reactions  . Ace Inhibitors Swelling    Throat and lips swelled; ended up in ED  . Codeine Other (See Comments)     Delirium, hallucinations  . Hydromorphone Nausea And Vomiting    Current Outpatient Medications  Medication Sig Dispense Refill  . acetaminophen (TYLENOL) 500 MG tablet Take 1,000 mg by mouth daily as needed for mild pain, moderate pain or headache (pain).     Marland Kitchen allopurinol (ZYLOPRIM) 100 MG tablet Take 100 mg by mouth daily with supper.     Marland Kitchen amLODipine (NORVASC) 5 MG tablet TAKE 1 TABLET BY MOUTH ONCE DAILY. PATIENT NEEDS TO BE SEEN. (Patient taking differently: TAKE 1 TABLET BY MOUTH ONCE DAILY.) 15 tablet 0  . aspirin EC 81 MG tablet Take 81 mg by mouth daily.    . carvedilol (COREG) 25 MG tablet TAKE (1) TABLET TWICE  DAILY. 60 tablet 6  . furosemide (LASIX) 80 MG tablet Take 1 tablet (80 mg total) by mouth 2 (two) times daily. 180 tablet 3  . gabapentin (NEURONTIN) 300 MG capsule Take 300 mg by mouth 2 (two) times daily.    . isosorbide mononitrate (IMDUR) 60 MG 24 hr tablet Take 60-120 mg by mouth 2 (two) times daily. 120 mg in the am, 60mg  in the  pm    . nitroGLYCERIN (NITROSTAT) 0.4 MG SL tablet PLACE 0.4 MG  UNDER THE TONGUE EVERY 5 MINUTES UP TO 3 DOSES AS NEEDED FOR CHEST PAIN. 25 tablet 3  . potassium chloride (K-DUR) 10 MEQ tablet Take 2 tablets (20 mEq total) by mouth daily. 180 tablet 3  . simvastatin (ZOCOR) 40 MG tablet Take 40 mg by mouth daily.    Marland Kitchen glipiZIDE (GLUCOTROL XL) 10 MG 24 hr tablet Take 10 mg by mouth 2 (two) times daily.     . tamsulosin (FLOMAX) 0.4 MG CAPS capsule Take 1 capsule (0.4 mg total) by mouth  daily after supper. (Patient not taking: Reported on 01/15/2018) 30 capsule 0   No current facility-administered medications for this visit.     REVIEW OF SYSTEMS:  [X]  denotes positive finding, [ ]  denotes negative finding Cardiac  Comments:  Chest pain or chest pressure:    Shortness of breath upon exertion:    Short of breath when lying flat:    Irregular heart rhythm:        Vascular    Pain in calf, thigh, or hip brought on by ambulation: x   Pain in feet at night that wakes you up from your sleep:     Blood clot in your veins:    Leg swelling:           PHYSICAL EXAM: Vitals:   01/15/18 1225  BP: 139/69  Pulse: (!) 59  Resp: 18  Temp: (!) 97.3 F (36.3 C)  TempSrc: Oral  SpO2: 94%  Weight: 243 lb (110.2 kg)  Height: 6\' 1"  (1.854 m)    GENERAL: The patient is a well-nourished male, in no acute distress. The vital signs are documented above. CARDIOVASCULAR: The arteries without bruits bilaterally.  2+ radial pulses bilaterally.  Palpable left popliteal pulse and absent right popliteal pulse PULMONARY: There is good air exchange  MUSCULOSKELETAL: There are no major deformities or cyanosis. NEUROLOGIC: No focal weakness or paresthesias are detected. SKIN: There are no ulcers or rashes noted. PSYCHIATRIC: The patient has a normal affect.  DATA:  He did undergo a carotid duplex due to his recent episode of right eye blindness.  Carotid duplex reveals no evidence of carotid disease bilaterally  Invasive lower extreme the studies revealed patency of his left leg bypass around the left SFA aneurysm.  Does show known occlusion of his right superficial femoral artery.  Ankle arm index is stable at 0.83 on the left and 0.44 on the right. Ultrasound of his left leg graft shows moderate narrowing at the distal anastomosis which is unchanged  MEDICAL ISSUES: Stable overall.  Unusual symptom complex which would not be explained by his right SFA occlusion.  May be related to  degenerative disc disease in his back.  We will continue yearly duplex follow-up with [place].  He will discuss visual changes with his eye doctor but does not have any evidence of emboli from his carotid artery    Rosetta Posner, MD FACS Vascular and Vein Specialists of [facility] [phone] Pager [phone]  

## 2018-01-17 DIAGNOSIS — E119 Type 2 diabetes mellitus without complications: Secondary | ICD-10-CM | POA: Diagnosis not present

## 2018-01-17 DIAGNOSIS — I10 Essential (primary) hypertension: Secondary | ICD-10-CM | POA: Diagnosis not present

## 2018-01-22 ENCOUNTER — Other Ambulatory Visit: Payer: Self-pay

## 2018-01-22 NOTE — Patient Outreach (Signed)
[facility] [facility]) Care Management  01/22/2018  Ladd Cen Mori [birth year] [id number]   Medication Adherence call to Mr. Dreon Summelin left a message for patient to call back patient is due on Glipizide Er 10 mg and Simvastatin 40 mg. Mr. Ballon is showing past due under [place].  [place] Management Direct Dial [phone]  Fax [phone] [email or web address]

## 2018-01-30 ENCOUNTER — Other Ambulatory Visit: Payer: Self-pay | Admitting: Cardiology

## 2018-02-11 ENCOUNTER — Encounter: Payer: Self-pay | Admitting: Cardiology

## 2018-02-11 ENCOUNTER — Other Ambulatory Visit (HOSPITAL_COMMUNITY)
Admission: RE | Admit: 2018-02-11 | Discharge: 2018-02-11 | Disposition: A | Discharge status: Home / Self Care | Payer: Medicare Other | Source: Ambulatory Visit | Attending: Cardiology | Admitting: Cardiology

## 2018-02-11 ENCOUNTER — Ambulatory Visit (INDEPENDENT_AMBULATORY_CARE_PROVIDER_SITE_OTHER): Payer: Medicare Other | Admitting: Cardiology

## 2018-02-11 VITALS — BP 140/70 | HR 87 | Ht 73.0 in | Wt 244.0 lb

## 2018-02-11 DIAGNOSIS — I255 Ischemic cardiomyopathy: Secondary | ICD-10-CM

## 2018-02-11 DIAGNOSIS — N289 Disorder of kidney and ureter, unspecified: Secondary | ICD-10-CM

## 2018-02-11 DIAGNOSIS — E782 Mixed hyperlipidemia: Secondary | ICD-10-CM | POA: Diagnosis not present

## 2018-02-11 DIAGNOSIS — I25119 Atherosclerotic heart disease of native coronary artery with unspecified angina pectoris: Secondary | ICD-10-CM | POA: Diagnosis not present

## 2018-02-11 DIAGNOSIS — Z95810 Presence of automatic (implantable) cardiac defibrillator: Secondary | ICD-10-CM

## 2018-02-11 LAB — BASIC METABOLIC PANEL
ANION GAP: 10 (ref 5–15)
BUN: 18 mg/dL (ref 8–23)
CALCIUM: 9.2 mg/dL (ref 8.9–10.3)
CO2: 27 mmol/L (ref 22–32)
CREATININE: 1.6 mg/dL — AB (ref 0.61–1.24)
Chloride: 102 mmol/L (ref 98–111)
GFR calc Af Amer: 49 mL/min — ABNORMAL LOW (ref >60)
GFR, EST NON AFRICAN AMERICAN: 42 mL/min — AB (ref >60)
Glucose, Bld: 221 mg/dL — ABNORMAL HIGH (ref 70–99)
Potassium: 4.1 mmol/L (ref 3.5–5.1)
SODIUM: 139 mmol/L (ref 135–145)

## 2018-02-11 MED ORDER — POTASSIUM CHLORIDE ER 10 MEQ PO TBCR: 20.0000 meq | EXTENDED_RELEASE_TABLET | Freq: Every day | ORAL | 3 refills | Status: DC

## 2018-02-11 MED ORDER — CARVEDILOL 25 MG PO TABS: 25.0000 mg | ORAL_TABLET | Freq: Two times a day (BID) | ORAL | 3 refills | Status: DC

## 2018-02-11 MED ORDER — AMLODIPINE BESYLATE 5 MG PO TABS: ORAL_TABLET | ORAL | 3 refills | Status: DC

## 2018-02-11 MED ORDER — ISOSORBIDE MONONITRATE ER 60 MG PO TB24: 60.0000 mg | ORAL_TABLET | Freq: Two times a day (BID) | ORAL | 3 refills | Status: DC

## 2018-02-11 MED ORDER — SIMVASTATIN 40 MG PO TABS: 40.0000 mg | ORAL_TABLET | Freq: Every day | ORAL | 3 refills | Status: DC

## 2018-02-11 NOTE — Progress Notes (Signed)
Cardiology Office Note  Date: 02/11/2018   ID: [name], DOB 1945/07/11, MRN [id number]  PCP: Doree Albee, MD  Primary Cardiologist: Rozann Lesches, MD   Chief Complaint  Patient presents with  . Coronary Artery Disease    History of Present Illness: [name] is a medically complex 73 y.o. male last seen in March.  He presents today for a routine follow-up visit.  He does not report any angina symptoms or nitroglycerin use.  States that he generally just has not felt well since hospitalization earlier in the year with pneumonia.  His PCP is Dr. Anastasio Champion.  States that he "worries about everything" and does have multiple somatic complaints today.  He worries that his defibrillator was going to stop.  He continues to follow in the device clinic with Dr. Lovena Le, [place] biventricular ICD in place.  Device interrogation in July showed 93% biventricular pacing with less than 1% AT/AF burden.  Thoracic impedance was also stable.  His echocardiogram from July of last year showed LVEF low normal range at 50 to 55%.  We did discuss obtaining a follow-up study.  I reviewed his current medications which are listed below.  Current cardiac regimen includes aspirin, Coreg, Norvasc, Lasix, potassium supplements, Imdur, and Zocor.  He is not on ARB or ACE inhibitor with history of angioedema and also renal insufficiency.  Past Medical History:  Diagnosis Date  . AAA (abdominal aortic aneurysm) ([place])    Followed by Dr. Donnetta Hutching  . AICD (automatic cardioverter/defibrillator) present   . Angioedema    Secondary to ACE inhibitor  . Arthritis   . Bell palsy   . Carotid artery disease ([place])   . Coronary atherosclerosis of native coronary artery    Multivessel status post CABG  . Essential hypertension, benign   . Gout   . Hernia of abdominal wall   . Hypercholesteremia   . Ischemic cardiomyopathy    LVEF 45-50% 11/2011  . Migraines   . Myocardial infarction ([place])   .  Nephrolithiasis   . PAD (peripheral artery disease) ([place])    Followed by Dr. Donnetta Hutching  . Sleep apnea   . Type 2 diabetes mellitus ([place])     Past Surgical History:  Procedure Laterality Date  . ABDOMINAL AORTIC ANEURYSM REPAIR  November 19, 2013   [facility] :  Dr. Sammuel Hines  . ABDOMINAL AORTIC ANEURYSM REPAIR  09-07-2014   [facility]  . BI-VENTRICULAR IMPLANTABLE CARDIOVERTER DEFIBRILLATOR  (CRT-D)  11/05/2014  . BYPASS GRAFT POPLITEAL TO POPLITEAL Left 06/08/2014   Procedure: BYPASS GRAFT FEMORAL ARTERY TO ABOVE KNEE POPLITEAL;  Surgeon: Rosetta Posner, MD;  Location: [place];  Service: Vascular;  Laterality: Left;  . CARDIAC CATHETERIZATION N/A 10/02/2014   Procedure: Left Heart Cath and Cors/Grafts Angiography;  Surgeon: Belva Crome, MD;  Location: [place] CV [facility];  Service: Cardiovascular;  Laterality: N/A;  . CARDIAC CATHETERIZATION  "several"  . CATARACT EXTRACTION W/PHACO  03/23/2011   Procedure: CATARACT EXTRACTION PHACO AND INTRAOCULAR LENS PLACEMENT (IOC);  Surgeon: Tonny Branch;  Location: [facility];  Service: Ophthalmology;  Laterality: Left;  CDE: 15.99  . CATARACT EXTRACTION W/PHACO  04/17/2011   Procedure: CATARACT EXTRACTION PHACO AND INTRAOCULAR LENS PLACEMENT (IOC);  Surgeon: Tonny Branch;  Location: [facility];  Service: Ophthalmology;  Laterality: Right;  CDE: 23.45  . CORONARY ARTERY BYPASS GRAFT  2001   LIMA to LAD, SVG to diagonal and ramus, SVG to OM, SVG to AM  .  DUPUYTREN CONTRACTURE RELEASE Left 2009  . EP IMPLANTABLE DEVICE N/A 11/05/2014   Procedure: BiV ICD Insertion CRT-D;  Surgeon: Evans Lance, MD;  Location: [place] CV [facility];  Service: Cardiovascular;  Laterality: N/A;  . EP IMPLANTABLE DEVICE N/A 02/24/2015   Procedure: Lead Revision/Repair;  Surgeon: Evans Lance, MD;  Location: [place] [facility];  Service: Cardiovascular;  Laterality: N/A;  . EXTRACORPOREAL SHOCK WAVE LITHOTRIPSY  X 1  . EYE SURGERY    . FOOT SURGERY Left 1963   "gangrene"  . ICD LEAD  REMOVAL  02/24/2015  . LOWER EXTREMITY ANGIOGRAM Bilateral 04/28/2015   Procedure: Lower Extremity Angiogram;  Surgeon: Rosetta Posner, MD;  Location: [place] CV LAB;  Service: Cardiovascular;  Laterality: Bilateral;  . PERIPHERAL VASCULAR CATHETERIZATION N/A 04/28/2015   Procedure: Abdominal Aortogram;  Surgeon: Rosetta Posner, MD;  Location: [place] [facility];  Service: Cardiovascular;  Laterality: N/A;    Current Outpatient Medications  Medication Sig Dispense Refill  . acetaminophen (TYLENOL) 500 MG tablet Take 1,000 mg by mouth daily as needed for mild pain, moderate pain or headache (pain).     Marland Kitchen allopurinol (ZYLOPRIM) 100 MG tablet Take 100 mg by mouth daily with supper.     Marland Kitchen amLODipine (NORVASC) 5 MG tablet TAKE 1 TABLET BY MOUTH ONCE DAILY. PATIENT NEEDS TO BE SEEN. 90 tablet 3  . aspirin EC 81 MG tablet Take 81 mg by mouth daily.    . carvedilol (COREG) 25 MG tablet Take 1 tablet (25 mg total) by mouth 2 (two) times daily with a meal. 180 tablet 3  . furosemide (LASIX) 80 MG tablet Take 1 tablet (80 mg total) by mouth 2 (two) times daily. 180 tablet 3  . gabapentin (NEURONTIN) 300 MG capsule Take 300 mg by mouth 2 (two) times daily.    . isosorbide mononitrate (IMDUR) 60 MG 24 hr tablet Take 1-2 tablets (60-120 mg total) by mouth 2 (two) times daily. 120 mg in the am, 60mg  in the  pm 270 tablet 3  . nitroGLYCERIN (NITROSTAT) 0.4 MG SL tablet PLACE 0.4 MG  UNDER THE TONGUE EVERY 5 MINUTES UP TO 3 DOSES AS NEEDED FOR CHEST PAIN. 25 tablet 3  . potassium chloride (K-DUR) 10 MEQ tablet Take 2 tablets (20 mEq total) by mouth daily. 90 tablet 3  . simvastatin (ZOCOR) 40 MG tablet Take 1 tablet (40 mg total) by mouth daily. 90 tablet 3  . tamsulosin (FLOMAX) 0.4 MG CAPS capsule Take 1 capsule (0.4 mg total) by mouth daily after supper. 30 capsule 0   No current facility-administered medications for this visit.    Allergies:  Ace inhibitors; Codeine; and Hydromorphone   Social History:  The patient  reports that he quit smoking about 10 years ago. His smoking use included cigarettes. He started smoking about 38 years ago. He has a 25.00 pack-year smoking history. He has never used smokeless tobacco. He reports that he does not drink alcohol or use drugs.   ROS:  Please see the history of present illness. Otherwise, complete review of systems is positive for arthritic complaints, hearing loss, recent vision changes.  All other systems are reviewed and negative.   Physical Exam: VS:  BP 140/70 (BP Location: Right Arm)   Pulse 87   Ht 6\' 1"  (1.854 m)   Wt 244 lb (110.7 kg)   SpO2 96%   BMI 32.19 kg/m , BMI Body mass index is 32.19 kg/m.  Wt Readings from  Last 3 Encounters:  02/11/18 244 lb (110.7 kg)  01/15/18 243 lb (110.2 kg)  07/20/17 237 lb 9.6 oz (107.8 kg)    General: Obese male, appears comfortable at rest. HEENT: Conjunctiva and lids normal, oropharynx clear. Neck: Supple, no elevated JVP or carotid bruits, no thyromegaly. Lungs: Clear to auscultation, nonlabored breathing at rest. Cardiac: Regular rate and rhythm, no S3 or significant systolic murmur, no pericardial rub. Abdomen: Protuberant, nontender, bowel sounds present. Extremities: Trace ankle edema, distal pulses 2+. Skin: Warm and dry. Musculoskeletal: No kyphosis. Neuropsychiatric: Alert and oriented x3, affect grossly appropriate.  ECG: I personally reviewed the tracing from 07/12/2017 which showed ventricular pacing.  Recent Labwork: 06/29/2017: B Natriuretic Peptide 429.0 07/12/2017: BUN 35; Creatinine, Ser 2.14; Hemoglobin 12.5; Platelets 211; Potassium 3.8; Sodium 135   Other Studies Reviewed Today:  Echocardiogram 12/06/2016: Study Conclusions  - Left ventricle: The cavity size was normal. Wall thickness was   normal. Systolic function was normal. The estimated ejection   fraction was in the range of 50% to 55%. Wall motion was normal;   there were no regional wall motion abnormalities.  Doppler   parameters are consistent with abnormal left ventricular   relaxation (grade 1 diastolic dysfunction). - Aortic valve: Mildly to moderately calcified annulus. Trileaflet;   moderately thickened leaflets. Valve area (VTI): 2.89 cm^2. Valve   area (Vmax): 2.32 cm^2. Valve area (Vmean): 2.47 cm^2.  Assessment and Plan:  1.  Ischemic cardiomyopathy with improvement in LVEF to the range of 50 to 55% by echocardiogram last year.  He is clinically stable on present regimen including diuretics and his weight is also stable.  Plan to obtain a follow-up echocardiogram for reassessment.  He has a [employer] biventricular ICD in place and is followed by Dr. Lovena Le.  2.  CAD status post CABG with graft disease.  He reports no progressive angina symptoms on medical therapy.  3.  Mixed hyperlipidemia on Zocor.  Keep follow-up with Dr. Anastasio Champion.  4.  Renal insufficiency, creatinine 2.14 in February.  Follow-up BMET.  Current medicines were reviewed with the patient today.   Orders Placed This Encounter  Procedures  . Basic Metabolic Panel (BMET)  . ECHOCARDIOGRAM COMPLETE    Disposition: Follow-up in 6 months.   Signed, Satira Sark, MD, [facility] 02/11/2018 1:04 PM    [facility] [facility] [address] [place], [place] [address] Phone: [phone]; Fax: [phone]

## 2018-02-11 NOTE — Patient Instructions (Addendum)
Your physician wants you to follow-up in: 6 months with Dr.McDowell You will receive a reminder letter in the mail two months in advance. If you don't receive a letter, please call our office to schedule the follow-up appointment.    Your physician recommends that you continue on your current medications as directed. Please refer to the Current Medication list given to you today.  If you need a refill on your cardiac medications before your next appointment, please call your pharmacy.    Get lab work: BMET    Your physician has requested that you have an echocardiogram. Echocardiography is a painless test that uses sound waves to create images of your heart. It provides your doctor with information about the size and shape of your heart and how well your heart's chambers and valves are working. This procedure takes approximately one hour. There are no restrictions for this procedure.    Thank you for choosing [facility] !

## 2018-02-12 ENCOUNTER — Telehealth: Payer: Self-pay | Admitting: *Deleted

## 2018-02-12 NOTE — Telephone Encounter (Signed)
-----   Message from Satira Sark, MD sent at 02/11/2018  1:19 PM EDT ----- Results reviewed.  Creatinine remains abnormal at 1.6, but better than last check which was 2.1 in February.  Continue with current medications. A copy of this test should be forwarded to Doree Albee, MD.

## 2018-02-12 NOTE — Telephone Encounter (Signed)
Patient informed. Copy sent to pcp.

## 2018-02-13 ENCOUNTER — Ambulatory Visit (HOSPITAL_COMMUNITY)
Admission: RE | Admit: 2018-02-13 | Discharge: 2018-02-13 | Disposition: A | Discharge status: Home / Self Care | Payer: Medicare Other | Source: Ambulatory Visit | Attending: Cardiology | Admitting: Cardiology

## 2018-02-13 DIAGNOSIS — Z95810 Presence of automatic (implantable) cardiac defibrillator: Secondary | ICD-10-CM | POA: Insufficient documentation

## 2018-02-13 DIAGNOSIS — Z951 Presence of aortocoronary bypass graft: Secondary | ICD-10-CM | POA: Insufficient documentation

## 2018-02-13 DIAGNOSIS — E119 Type 2 diabetes mellitus without complications: Secondary | ICD-10-CM | POA: Insufficient documentation

## 2018-02-13 DIAGNOSIS — Z87891 Personal history of nicotine dependence: Secondary | ICD-10-CM | POA: Insufficient documentation

## 2018-02-13 DIAGNOSIS — J449 Chronic obstructive pulmonary disease, unspecified: Secondary | ICD-10-CM | POA: Insufficient documentation

## 2018-02-13 DIAGNOSIS — I255 Ischemic cardiomyopathy: Secondary | ICD-10-CM | POA: Diagnosis not present

## 2018-02-13 DIAGNOSIS — I2510 Atherosclerotic heart disease of native coronary artery without angina pectoris: Secondary | ICD-10-CM | POA: Insufficient documentation

## 2018-02-13 DIAGNOSIS — I447 Left bundle-branch block, unspecified: Secondary | ICD-10-CM | POA: Diagnosis not present

## 2018-02-13 NOTE — Progress Notes (Signed)
*  PRELIMINARY RESULTS* Echocardiogram 2D Echocardiogram has been performed.  [name] 02/13/2018, 2:33 PM

## 2018-02-25 ENCOUNTER — Ambulatory Visit (INDEPENDENT_AMBULATORY_CARE_PROVIDER_SITE_OTHER): Payer: Medicare Other | Admitting: *Deleted

## 2018-02-25 DIAGNOSIS — I255 Ischemic cardiomyopathy: Secondary | ICD-10-CM

## 2018-02-25 DIAGNOSIS — I5022 Chronic systolic (congestive) heart failure: Secondary | ICD-10-CM

## 2018-02-25 NOTE — Progress Notes (Signed)
Remote ICD transmission.   

## 2018-03-04 ENCOUNTER — Other Ambulatory Visit: Payer: Self-pay | Admitting: Cardiology

## 2018-03-05 DIAGNOSIS — Z23 Encounter for immunization: Secondary | ICD-10-CM | POA: Diagnosis not present

## 2018-03-05 DIAGNOSIS — E785 Hyperlipidemia, unspecified: Secondary | ICD-10-CM | POA: Diagnosis not present

## 2018-03-05 DIAGNOSIS — E119 Type 2 diabetes mellitus without complications: Secondary | ICD-10-CM | POA: Diagnosis not present

## 2018-03-05 DIAGNOSIS — I129 Hypertensive chronic kidney disease with stage 1 through stage 4 chronic kidney disease, or unspecified chronic kidney disease: Secondary | ICD-10-CM | POA: Diagnosis not present

## 2018-03-05 DIAGNOSIS — I10 Essential (primary) hypertension: Secondary | ICD-10-CM | POA: Diagnosis not present

## 2018-03-05 DIAGNOSIS — E559 Vitamin D deficiency, unspecified: Secondary | ICD-10-CM | POA: Diagnosis not present

## 2018-03-06 ENCOUNTER — Encounter: Payer: Self-pay | Admitting: Cardiology

## 2018-03-26 DIAGNOSIS — I2510 Atherosclerotic heart disease of native coronary artery without angina pectoris: Secondary | ICD-10-CM | POA: Diagnosis not present

## 2018-03-26 DIAGNOSIS — H53131 Sudden visual loss, right eye: Secondary | ICD-10-CM | POA: Diagnosis not present

## 2018-04-09 LAB — CUP PACEART REMOTE DEVICE CHECK
Battery Remaining Longevity: 34 mo
Battery Remaining Percentage: 49 %
Battery Voltage: 2.92 V
Brady Statistic AP VP Percent: 51 %
Brady Statistic AP VS Percent: 1 %
Brady Statistic AS VP Percent: 42 %
Brady Statistic AS VS Percent: 1.3 %
Brady Statistic RA Percent Paced: 46 %
Date Time Interrogation Session: 20191005091019
HighPow Impedance: 70 Ohm
HighPow Impedance: 70 Ohm
Implantable Lead Implant Date: 20160616
Implantable Lead Implant Date: 20161005
Implantable Lead Implant Date: 20161005
Implantable Lead Location: 753858
Implantable Lead Location: 753859
Implantable Lead Location: 753860
Implantable Lead Model: 7122
Implantable Pulse Generator Implant Date: 20160616
Lead Channel Impedance Value: 400 Ohm
Lead Channel Impedance Value: 560 Ohm
Lead Channel Impedance Value: 800 Ohm
Lead Channel Pacing Threshold Amplitude: 0.75 V
Lead Channel Pacing Threshold Amplitude: 1 V
Lead Channel Pacing Threshold Amplitude: 2.25 V
Lead Channel Pacing Threshold Pulse Width: 0.5 ms
Lead Channel Pacing Threshold Pulse Width: 0.5 ms
Lead Channel Pacing Threshold Pulse Width: 1 ms
Lead Channel Sensing Intrinsic Amplitude: 11.6 mV
Lead Channel Sensing Intrinsic Amplitude: 2.4 mV
Lead Channel Setting Pacing Amplitude: 2 V
Lead Channel Setting Pacing Amplitude: 2 V
Lead Channel Setting Pacing Amplitude: 3.25 V
Lead Channel Setting Pacing Pulse Width: 0.5 ms
Lead Channel Setting Pacing Pulse Width: 1 ms
Lead Channel Setting Sensing Sensitivity: 0.5 mV
Pulse Gen Serial Number: 7288352

## 2018-04-23 DIAGNOSIS — E119 Type 2 diabetes mellitus without complications: Secondary | ICD-10-CM | POA: Diagnosis not present

## 2018-04-23 DIAGNOSIS — H9191 Unspecified hearing loss, right ear: Secondary | ICD-10-CM | POA: Diagnosis not present

## 2018-04-23 DIAGNOSIS — E785 Hyperlipidemia, unspecified: Secondary | ICD-10-CM | POA: Diagnosis not present

## 2018-04-23 DIAGNOSIS — I2510 Atherosclerotic heart disease of native coronary artery without angina pectoris: Secondary | ICD-10-CM | POA: Diagnosis not present

## 2018-04-23 DIAGNOSIS — E1122 Type 2 diabetes mellitus with diabetic chronic kidney disease: Secondary | ICD-10-CM | POA: Diagnosis not present

## 2018-04-23 NOTE — Progress Notes (Signed)
Cardiology Office Note  Date: 04/24/2018   ID: [name], DOB 1945-06-07, MRN [id number]  PCP: Doree Albee, MD  Primary Cardiologist: Rozann Lesches, MD   Chief Complaint  Patient presents with  . Coronary Artery Disease    History of Present Illness: [name] is a 72 y.o. male last seen in September.  He is referred back to the office by Dr. Anastasio Champion with history of chest pain.  We discussed his symptoms today.  He states that he has had some "mild" episodes of chest pain intermittently, no progressive pattern, no use of nitroglycerin.  He still is able to function with ADLs and reports no worsening shortness of breath.  No palpitations or syncope.  He has known multivessel CAD as well as graft failure as outlined in his most recent cardiac catheterization in 2016.  He did not have good revascularization options at that time and has been managed medically.  He sees Dr. Lovena Le in the device clinic, [place] biventricular ICD in place.  Echocardiogram in September revealed LVEF 45 to 50% with basal inferolateral akinesis, grade 1 diastolic dysfunction.  I reviewed his medications today.  He does have room to further uptitrate Norvasc if needed, but after discussing the situation today he was comfortable with continuing on the current course.  We did plan to refill his nitroglycerin however, he still has an old bottle.  Past Medical History:  Diagnosis Date  . AAA (abdominal aortic aneurysm) ([place])    Followed by Dr. Donnetta Hutching  . AICD (automatic cardioverter/defibrillator) present   . Angioedema    Secondary to ACE inhibitor  . Arthritis   . Bell palsy   . Carotid artery disease ([place])   . Coronary atherosclerosis of native coronary artery    Multivessel status post CABG  . Essential hypertension, benign   . Gout   . Hernia of abdominal wall   . Hypercholesteremia   . Ischemic cardiomyopathy    LVEF 45-50% 11/2011  . Migraines   . Myocardial infarction  ([place])   . Nephrolithiasis   . PAD (peripheral artery disease) ([place])    Followed by Dr. Donnetta Hutching  . Sleep apnea   . Type 2 diabetes mellitus ([place])     Past Surgical History:  Procedure Laterality Date  . ABDOMINAL AORTIC ANEURYSM REPAIR  November 19, 2013   [facility] :  Dr. Sammuel Hines  . ABDOMINAL AORTIC ANEURYSM REPAIR  09-07-2014   [facility]  . BI-VENTRICULAR IMPLANTABLE CARDIOVERTER DEFIBRILLATOR  (CRT-D)  11/05/2014  . BYPASS GRAFT POPLITEAL TO POPLITEAL Left 06/08/2014   Procedure: BYPASS GRAFT FEMORAL ARTERY TO ABOVE KNEE POPLITEAL;  Surgeon: Rosetta Posner, MD;  Location: [place];  Service: Vascular;  Laterality: Left;  . CARDIAC CATHETERIZATION N/A 10/02/2014   Procedure: Left Heart Cath and Cors/Grafts Angiography;  Surgeon: Belva Crome, MD;  Location: [place] [facility];  Service: Cardiovascular;  Laterality: N/A;  . CARDIAC CATHETERIZATION  "several"  . CATARACT EXTRACTION W/PHACO  03/23/2011   Procedure: CATARACT EXTRACTION PHACO AND INTRAOCULAR LENS PLACEMENT (IOC);  Surgeon: Tonny Branch;  Location: [facility];  Service: Ophthalmology;  Laterality: Left;  CDE: 15.99  . CATARACT EXTRACTION W/PHACO  04/17/2011   Procedure: CATARACT EXTRACTION PHACO AND INTRAOCULAR LENS PLACEMENT (IOC);  Surgeon: Tonny Branch;  Location: [facility];  Service: Ophthalmology;  Laterality: Right;  CDE: 23.45  . CORONARY ARTERY BYPASS GRAFT  2001   LIMA to LAD, SVG to diagonal and ramus, SVG  to OM, SVG to AM  . DUPUYTREN CONTRACTURE RELEASE Left 2009  . EP IMPLANTABLE DEVICE N/A 11/05/2014   Procedure: BiV ICD Insertion CRT-D;  Surgeon: Evans Lance, MD;  Location: [place] [facility];  Service: Cardiovascular;  Laterality: N/A;  . EP IMPLANTABLE DEVICE N/A 02/24/2015   Procedure: Lead Revision/Repair;  Surgeon: Evans Lance, MD;  Location: [place] [facility];  Service: Cardiovascular;  Laterality: N/A;  . EXTRACORPOREAL SHOCK WAVE LITHOTRIPSY  X 1  . EYE SURGERY    . FOOT SURGERY Left 1963   "gangrene"  . ICD  LEAD REMOVAL  02/24/2015  . LOWER EXTREMITY ANGIOGRAM Bilateral 04/28/2015   Procedure: Lower Extremity Angiogram;  Surgeon: Rosetta Posner, MD;  Location: [place] [facility];  Service: Cardiovascular;  Laterality: Bilateral;  . PERIPHERAL VASCULAR CATHETERIZATION N/A 04/28/2015   Procedure: Abdominal Aortogram;  Surgeon: Rosetta Posner, MD;  Location: [place] [facility];  Service: Cardiovascular;  Laterality: N/A;    Current Outpatient Medications  Medication Sig Dispense Refill  . acetaminophen (TYLENOL) 500 MG tablet Take 1,000 mg by mouth daily as needed for mild pain, moderate pain or headache (pain).     Marland Kitchen allopurinol (ZYLOPRIM) 100 MG tablet Take 100 mg by mouth daily with supper.     Marland Kitchen amLODipine (NORVASC) 5 MG tablet TAKE 1 TABLET BY MOUTH ONCE DAILY. PATIENT NEEDS TO BE SEEN. 90 tablet 3  . aspirin EC 81 MG tablet Take 81 mg by mouth daily.    . carvedilol (COREG) 25 MG tablet Take 1 tablet (25 mg total) by mouth 2 (two) times daily with a meal. 180 tablet 3  . furosemide (LASIX) 80 MG tablet Take 1 tablet (80 mg total) by mouth 2 (two) times daily. 180 tablet 3  . gabapentin (NEURONTIN) 300 MG capsule Take 300 mg by mouth 2 (two) times daily.    . isosorbide mononitrate (IMDUR) 60 MG 24 hr tablet Take 1-2 tablets (60-120 mg total) by mouth 2 (two) times daily. 120 mg in the am, 60mg  in the  pm 270 tablet 3  . nitroGLYCERIN (NITROSTAT) 0.4 MG SL tablet PLACE 0.4 MG  UNDER THE TONGUE EVERY 5 MINUTES UP TO 3 DOSES AS NEEDED FOR CHEST PAIN. 25 tablet 3  . potassium chloride (K-DUR) 10 MEQ tablet Take 2 tablets (20 mEq total) by mouth daily. 90 tablet 3  . simvastatin (ZOCOR) 40 MG tablet Take 1 tablet (40 mg total) by mouth daily. 90 tablet 3  . tamsulosin (FLOMAX) 0.4 MG CAPS capsule Take 1 capsule (0.4 mg total) by mouth daily after supper. 30 capsule 0   No current facility-administered medications for this visit.    Allergies:  Ace inhibitors; Codeine; and Hydromorphone   Social  History: The patient  reports that he quit smoking about 10 years ago. His smoking use included cigarettes. He started smoking about 39 years ago. He has a 25.00 pack-year smoking history. He has never used smokeless tobacco. He reports that he does not drink alcohol or use drugs.   ROS:  Please see the history of present illness. Otherwise, complete review of systems is positive for hearing loss.  All other systems are reviewed and negative.   Physical Exam: VS:  BP 140/66   Pulse 60   Ht 6\' 1"  (1.854 m)   Wt 241 lb 9.6 oz (109.6 kg)   SpO2 96% Comment: on room air  BMI 31.88 kg/m , BMI Body mass index is 31.88 kg/m.  Wt Readings from Last 3 Encounters:  04/24/18 241 lb 9.6 oz (109.6 kg)  02/11/18 244 lb (110.7 kg)  01/15/18 243 lb (110.2 kg)    General: Obese elderly male, appears comfortable at rest. HEENT: Conjunctiva and lids normal, oropharynx clear. Neck: Supple, no elevated JVP or carotid bruits, no thyromegaly. Lungs: Clear to auscultation, nonlabored breathing at rest. Cardiac: Regular rate and rhythm, no S3 or significant systolic murmur. Abdomen: Soft, nontender, bowel sounds present. Extremities: Trace ankle edema, distal pulses 2+. Skin: Warm and dry. Musculoskeletal: No kyphosis. Neuropsychiatric: Alert and oriented x3, affect grossly appropriate.  ECG: I personally reviewed the tracing from 03/26/2018 which showed dual-chamber pacing.  Recent Labwork: 06/29/2017: B Natriuretic Peptide 429.0 07/12/2017: Hemoglobin 12.5; Platelets 211 02/11/2018: BUN 18; Creatinine, Ser 1.60; Potassium 4.1; Sodium 139     Component Value Date/Time   CHOL 118 10/16/2014 0715   TRIG 100 10/16/2014 0715   HDL 33 (L) 10/16/2014 0715   CHOLHDL 3.6 10/16/2014 0715   VLDL 20 10/16/2014 0715   LDLCALC 65 10/16/2014 0715    Other Studies Reviewed Today:  Echocardiogram 02/13/2018: Study Conclusions  - Left ventricle: The cavity size was normal. Wall thickness was   increased in a  pattern of mild LVH. Systolic function was mildly   reduced. The estimated ejection fraction was in the range of 45%   to 50%. There is akinesis of the basalinferolateral myocardium.   Doppler parameters are consistent with abnormal left ventricular   relaxation (grade 1 diastolic dysfunction). - Aortic valve: Mildly to moderately calcified annulus. Trileaflet;   mildly calcified leaflets. - Mitral valve: Mildly calcified annulus. There was trivial   regurgitation. - Right ventricle: Pacer wire or catheter noted in right ventricle. - Right atrium: Central venous pressure (est): 3 mm Hg. - Atrial septum: No defect or patent foramen ovale was identified. - Tricuspid valve: There was trivial regurgitation. - Pericardium, extracardiac: There was no pericardial effusion.  Assessment and Plan:  1.  Multivessel CAD status post CABG with documented graft failure and plan for medical therapy in the absence of good revascularization options.  LIMA to LAD is still patent.  Reports mild angina symptoms at this time, not requiring nitroglycerin.  Plan will be to continue observation on current medical regimen realizing that Norvasc could be increased further if needed.  Refill provided for fresh bottle of nitroglycerin.  2.  Mixed hyperlipidemia, he continues on Zocor.  3.  Ischemic cardiomyopathy with LVEF improved to the range of 45 to 50% as of September.  He has a [employer] biventricular ICD in place and continues to follow with Dr. Lovena Le.  No palpitations or syncope.  Current medicines were reviewed with the patient today.  Disposition: Follow-up in 3 months.  Signed, Satira Sark, MD, [facility] 04/24/2018 1:43 PM    [place] [facility] [address], [place], [place] [address] Phone: [phone]; Fax: [phone]

## 2018-04-24 ENCOUNTER — Encounter: Payer: Self-pay | Admitting: Cardiology

## 2018-04-24 ENCOUNTER — Ambulatory Visit: Payer: Medicare Other | Admitting: Cardiology

## 2018-04-24 VITALS — BP 140/66 | HR 60 | Ht 73.0 in | Wt 241.6 lb

## 2018-04-24 DIAGNOSIS — E782 Mixed hyperlipidemia: Secondary | ICD-10-CM

## 2018-04-24 DIAGNOSIS — I255 Ischemic cardiomyopathy: Secondary | ICD-10-CM | POA: Diagnosis not present

## 2018-04-24 DIAGNOSIS — I25119 Atherosclerotic heart disease of native coronary artery with unspecified angina pectoris: Secondary | ICD-10-CM

## 2018-04-24 MED ORDER — NITROGLYCERIN 0.4 MG SL SUBL: SUBLINGUAL_TABLET | SUBLINGUAL | 3 refills | Status: DC

## 2018-04-24 NOTE — Patient Instructions (Signed)
Medication Instructions:  Your physician recommends that you continue on your current medications as directed. Please refer to the Current Medication list given to you today.   Labwork: NONE  Testing/Procedures: NONE  Follow-Up: Your physician recommends that you schedule a follow-up appointment in: 3 MONTHS    Any Other Special Instructions Will Be Listed Below (If Applicable).     If you need a refill on your cardiac medications before your next appointment, please call your pharmacy.   

## 2018-05-27 ENCOUNTER — Ambulatory Visit (INDEPENDENT_AMBULATORY_CARE_PROVIDER_SITE_OTHER): Payer: Medicare Other

## 2018-05-27 DIAGNOSIS — Z95810 Presence of automatic (implantable) cardiac defibrillator: Secondary | ICD-10-CM

## 2018-05-27 DIAGNOSIS — I5022 Chronic systolic (congestive) heart failure: Secondary | ICD-10-CM

## 2018-05-27 DIAGNOSIS — I255 Ischemic cardiomyopathy: Secondary | ICD-10-CM

## 2018-05-28 LAB — CUP PACEART REMOTE DEVICE CHECK
Battery Voltage: 2.92 V
Brady Statistic AP VS Percent: 1 %
Brady Statistic AS VP Percent: 40 %
HighPow Impedance: 75 Ohm
HighPow Impedance: 75 Ohm
Implantable Lead Implant Date: 20161005
Implantable Lead Location: 753859
Implantable Lead Model: 7122
Implantable Pulse Generator Implant Date: 20160616
Lead Channel Impedance Value: 550 Ohm
Lead Channel Pacing Threshold Amplitude: 0.875 V
Lead Channel Sensing Intrinsic Amplitude: 2.3 mV
Lead Channel Sensing Intrinsic Amplitude: 5.1 mV
Lead Channel Setting Pacing Amplitude: 1.875
Lead Channel Setting Pacing Amplitude: 2 V
Lead Channel Setting Pacing Pulse Width: 0.5 ms
Lead Channel Setting Pacing Pulse Width: 1 ms
MDC IDC LEAD IMPLANT DT: 20160616
MDC IDC LEAD IMPLANT DT: 20161005
MDC IDC LEAD LOCATION: 753858
MDC IDC LEAD LOCATION: 753860
MDC IDC MSMT BATTERY REMAINING LONGEVITY: 30 mo
MDC IDC MSMT BATTERY REMAINING PERCENTAGE: 45 %
MDC IDC MSMT LEADCHNL LV IMPEDANCE VALUE: 810 Ohm
MDC IDC MSMT LEADCHNL LV PACING THRESHOLD AMPLITUDE: 2.375 V
MDC IDC MSMT LEADCHNL LV PACING THRESHOLD PULSEWIDTH: 1 ms
MDC IDC MSMT LEADCHNL RA PACING THRESHOLD PULSEWIDTH: 0.5 ms
MDC IDC MSMT LEADCHNL RV IMPEDANCE VALUE: 410 Ohm
MDC IDC MSMT LEADCHNL RV PACING THRESHOLD AMPLITUDE: 0.75 V
MDC IDC MSMT LEADCHNL RV PACING THRESHOLD PULSEWIDTH: 0.5 ms
MDC IDC SESS DTM: 20200106070022
MDC IDC SET LEADCHNL LV PACING AMPLITUDE: 3.375
MDC IDC SET LEADCHNL RV SENSING SENSITIVITY: 0.5 mV
MDC IDC STAT BRADY AP VP PERCENT: 54 %
MDC IDC STAT BRADY AS VS PERCENT: 1.1 %
MDC IDC STAT BRADY RA PERCENT PACED: 50 %
Pulse Gen Serial Number: 7288352

## 2018-05-28 NOTE — Progress Notes (Signed)
Remote ICD transmission.   

## 2018-05-31 DIAGNOSIS — E119 Type 2 diabetes mellitus without complications: Secondary | ICD-10-CM | POA: Diagnosis not present

## 2018-05-31 DIAGNOSIS — N200 Calculus of kidney: Secondary | ICD-10-CM | POA: Diagnosis not present

## 2018-05-31 DIAGNOSIS — I2510 Atherosclerotic heart disease of native coronary artery without angina pectoris: Secondary | ICD-10-CM | POA: Insufficient documentation

## 2018-05-31 DIAGNOSIS — I10 Essential (primary) hypertension: Secondary | ICD-10-CM | POA: Diagnosis not present

## 2018-05-31 DIAGNOSIS — R1033 Periumbilical pain: Secondary | ICD-10-CM | POA: Diagnosis present

## 2018-05-31 DIAGNOSIS — Z79899 Other long term (current) drug therapy: Secondary | ICD-10-CM | POA: Diagnosis not present

## 2018-05-31 DIAGNOSIS — Z87891 Personal history of nicotine dependence: Secondary | ICD-10-CM | POA: Diagnosis not present

## 2018-05-31 DIAGNOSIS — K429 Umbilical hernia without obstruction or gangrene: Secondary | ICD-10-CM | POA: Insufficient documentation

## 2018-06-01 ENCOUNTER — Emergency Department (HOSPITAL_COMMUNITY): Payer: Medicare Other

## 2018-06-01 ENCOUNTER — Encounter (HOSPITAL_COMMUNITY): Payer: Self-pay | Admitting: *Deleted

## 2018-06-01 ENCOUNTER — Other Ambulatory Visit: Payer: Self-pay

## 2018-06-01 ENCOUNTER — Emergency Department (HOSPITAL_COMMUNITY)
Admission: EM | Admit: 2018-06-01 | Discharge: 2018-06-01 | Disposition: A | Discharge status: Home / Self Care | Payer: Medicare Other | Attending: Emergency Medicine | Admitting: Emergency Medicine

## 2018-06-01 DIAGNOSIS — K429 Umbilical hernia without obstruction or gangrene: Secondary | ICD-10-CM | POA: Diagnosis not present

## 2018-06-01 DIAGNOSIS — N200 Calculus of kidney: Secondary | ICD-10-CM | POA: Diagnosis not present

## 2018-06-01 LAB — CBC WITH DIFFERENTIAL/PLATELET
Abs Immature Granulocytes: 0.05 10*3/uL (ref 0.00–0.07)
Basophils Absolute: 0.1 10*3/uL (ref 0.0–0.1)
Basophils Relative: 1 %
EOS ABS: 0.4 10*3/uL (ref 0.0–0.5)
Eosinophils Relative: 3 %
HCT: 45.8 % (ref 39.0–52.0)
Hemoglobin: 14.9 g/dL (ref 13.0–17.0)
Immature Granulocytes: 0 %
Lymphocytes Relative: 16 %
Lymphs Abs: 2 10*3/uL (ref 0.7–4.0)
MCH: 31.6 pg (ref 26.0–34.0)
MCHC: 32.5 g/dL (ref 30.0–36.0)
MCV: 97 fL (ref 80.0–100.0)
MONOS PCT: 8 %
Monocytes Absolute: 1 10*3/uL (ref 0.1–1.0)
Neutro Abs: 9.2 10*3/uL — ABNORMAL HIGH (ref 1.7–7.7)
Neutrophils Relative %: 72 %
Platelets: 308 10*3/uL (ref 150–400)
RBC: 4.72 MIL/uL (ref 4.22–5.81)
RDW: 13.7 % (ref 11.5–15.5)
WBC: 12.8 10*3/uL — ABNORMAL HIGH (ref 4.0–10.5)
nRBC: 0 % (ref 0.0–0.2)

## 2018-06-01 LAB — COMPREHENSIVE METABOLIC PANEL
ALT: 23 U/L (ref 0–44)
AST: 22 U/L (ref 15–41)
Albumin: 4.4 g/dL (ref 3.5–5.0)
Alkaline Phosphatase: 76 U/L (ref 38–126)
Anion gap: 11 (ref 5–15)
BUN: 30 mg/dL — ABNORMAL HIGH (ref 8–23)
CO2: 24 mmol/L (ref 22–32)
Calcium: 9.4 mg/dL (ref 8.9–10.3)
Chloride: 100 mmol/L (ref 98–111)
Creatinine, Ser: 1.78 mg/dL — ABNORMAL HIGH (ref 0.61–1.24)
GFR calc Af Amer: 44 mL/min — ABNORMAL LOW (ref >60)
GFR, EST NON AFRICAN AMERICAN: 38 mL/min — AB (ref >60)
Glucose, Bld: 198 mg/dL — ABNORMAL HIGH (ref 70–99)
Potassium: 3.9 mmol/L (ref 3.5–5.1)
Sodium: 135 mmol/L (ref 135–145)
Total Bilirubin: 0.6 mg/dL (ref 0.3–1.2)
Total Protein: 8.1 g/dL (ref 6.5–8.1)

## 2018-06-01 LAB — URINALYSIS, ROUTINE W REFLEX MICROSCOPIC
Bilirubin Urine: NEGATIVE
Glucose, UA: NEGATIVE mg/dL
Hgb urine dipstick: NEGATIVE
Ketones, ur: NEGATIVE mg/dL
Leukocytes, UA: NEGATIVE
NITRITE: NEGATIVE
PROTEIN: NEGATIVE mg/dL
Specific Gravity, Urine: 1.011 (ref 1.005–1.030)
pH: 5 (ref 5.0–8.0)

## 2018-06-01 LAB — I-STAT CG4 LACTIC ACID, ED: Lactic Acid, Venous: 0.8 mmol/L (ref 0.5–1.9)

## 2018-06-01 LAB — LIPASE, BLOOD: LIPASE: 61 U/L — AB (ref 11–51)

## 2018-06-01 MED ORDER — ONDANSETRON HCL 4 MG/2ML IJ SOLN
4.0000 mg | Freq: Once | INTRAMUSCULAR | Status: AC
  Administered 2018-06-01: 4 mg via INTRAVENOUS
  Filled 2018-06-01: qty 2

## 2018-06-01 MED ORDER — IOPAMIDOL (ISOVUE-300) INJECTION 61%
80.0000 mL | Freq: Once | INTRAVENOUS | Status: AC | PRN
  Administered 2018-06-01: 80 mL via INTRAVENOUS

## 2018-06-01 MED ORDER — DOCUSATE SODIUM 100 MG PO CAPS: 100.0000 mg | ORAL_CAPSULE | Freq: Two times a day (BID) | ORAL | 0 refills | Status: AC

## 2018-06-01 MED ORDER — FENTANYL CITRATE (PF) 100 MCG/2ML IJ SOLN
100.0000 ug | Freq: Once | INTRAMUSCULAR | Status: AC
  Administered 2018-06-01: 100 ug via INTRAVENOUS
  Filled 2018-06-01: qty 2

## 2018-06-01 MED ORDER — HYDROCODONE-ACETAMINOPHEN 5-325 MG PO TABS: 1.0000 | ORAL_TABLET | Freq: Four times a day (QID) | ORAL | 0 refills | Status: DC | PRN

## 2018-06-01 MED ORDER — LORAZEPAM 2 MG/ML IJ SOLN
INTRAMUSCULAR | Status: AC
  Filled 2018-06-01: qty 1

## 2018-06-01 NOTE — ED Triage Notes (Signed)
Pt states that he has had an umbilical hernia for a while but tonight area started hurting, denies any n/v/d,

## 2018-06-01 NOTE — ED Provider Notes (Signed)
[facility] EMERGENCY DEPARTMENT Provider Note   CSN: [id number] Arrival date & time: 05/31/18  2346     History   Chief Complaint Chief Complaint  Patient presents with  . Abdominal Pain    HPI [name] is a 73 y.o. male.  The history is provided by the patient.  Abdominal Pain  Pain location:  Periumbilical Pain quality: aching   Pain severity:  Moderate Onset quality:  Gradual Timing:  Constant Progression:  Unchanged Relieved by:  Nothing Worsened by:  Movement and palpation Associated symptoms: constipation   Associated symptoms: no chest pain, no diarrhea, no fever and no shortness of breath   Risk factors: aspirin   Patient with multiple medical conditions including CAD, vascular disease, diabetes presents with abdominal pain.  He reports over the past several hours he has been having periumbilical abdominal pain.  He has a hernia in that region that is painful.  No fever/vomit/diarrhea.  No urinary complaints.  He also reports some associated back pain.  Not recall having this pain previously  Past Medical History:  Diagnosis Date  . AAA (abdominal aortic aneurysm) ([place])    Followed by Dr. Donnetta Hutching  . AICD (automatic cardioverter/defibrillator) present   . Angioedema    Secondary to ACE inhibitor  . Arthritis   . Bell palsy   . Carotid artery disease ([place])   . Coronary atherosclerosis of native coronary artery    Multivessel status post CABG  . Essential hypertension, benign   . Gout   . Hernia of abdominal wall   . Hypercholesteremia   . Ischemic cardiomyopathy    LVEF 45-50% 11/2011  . Migraines   . Myocardial infarction ([place])   . Nephrolithiasis   . PAD (peripheral artery disease) ([place])    Followed by Dr. Donnetta Hutching  . Sleep apnea   . Type 2 diabetes mellitus [facility])     Patient Active Problem List   Diagnosis Date Noted  . COPD with acute exacerbation ([place]) 07/05/2017  . Lobar pneumonia ([place]) 07/05/2017  . Chronic diastolic CHF (congestive  heart failure) ([place]) 06/27/2017  . Bronchitis 06/27/2017  . Acute respiratory failure with hypoxemia ([place]) 06/27/2017  . ICD (implantable cardioverter-defibrillator) malfunction 02/19/2015  . Claudication ([place]) 02/04/2015  . S/P ICD (internal cardiac defibrillator) procedure 11/05/2014  . Cardiomyopathy, ischemic 10/20/2014  . LBBB (left bundle branch block) 10/01/2014  . PVD - h/o AAA stent graft, popliteal aneurysm repair 10/01/2014  . Unstable angina ([place]) 09/30/2014  . Acute kidney injury ([place]) 09/30/2014  . Chronic renal impairment, stage 3 (moderate) ([place]) 09/30/2014  . Pre-diabetes 09/30/2014  . Chronic systolic congestive heart failure ([place]) 09/30/2014  . Peripheral neuropathy 09/30/2014  . Essential hypertension 09/30/2014  . Gout 09/30/2014  . HLD (hyperlipidemia) 09/30/2014  . Frequent PVCs   . Diabetes type 2, controlled ([place]) 06/22/2014  . Aneurysm of left femoral artery ([place]) 06/08/2014  . Popliteal artery aneurysm ([place]) 09/23/2013  . Preoperative cardiovascular examination 08/29/2013  . Abdominal aneurysm without mention of rupture 03/05/2012  . ICM-EF 35% by echo [facility] April 2016   . Carotid artery disease ([place])   . Hx of CABG 2001, abnormal but low risk Myoview April 2015 11/26/2009  . ATHEROSLERO NATV ART EXTREM W/INTERMIT CLAUDICAT 09/23/2009  . Aneurysm of artery of lower extremity ([place]) 05/25/2009  . DIABETIC PERIPHERAL NEUROPATHY 03/30/2009  . DYSLIPIDEMIA 02/22/2009  . Chronic systolic heart failure ([place]) 02/22/2009    Past Surgical History:  Procedure Laterality Date  . ABDOMINAL AORTIC ANEURYSM  REPAIR  November 19, 2013   [facility] :  Dr. Sammuel Hines  . ABDOMINAL AORTIC ANEURYSM REPAIR  09-07-2014   [facility]  . BI-VENTRICULAR IMPLANTABLE CARDIOVERTER DEFIBRILLATOR  (CRT-D)  11/05/2014  . BYPASS GRAFT POPLITEAL TO POPLITEAL Left 06/08/2014   Procedure: BYPASS GRAFT FEMORAL ARTERY TO ABOVE KNEE POPLITEAL;  Surgeon: Rosetta Posner, MD;  Location: [place];   Service: Vascular;  Laterality: Left;  . CARDIAC CATHETERIZATION N/A 10/02/2014   Procedure: Left Heart Cath and Cors/Grafts Angiography;  Surgeon: Belva Crome, MD;  Location: [place] CV LAB;  Service: Cardiovascular;  Laterality: N/A;  . CARDIAC CATHETERIZATION  "several"  . CATARACT EXTRACTION W/PHACO  03/23/2011   Procedure: CATARACT EXTRACTION PHACO AND INTRAOCULAR LENS PLACEMENT (IOC);  Surgeon: Tonny Branch;  Location: [facility];  Service: Ophthalmology;  Laterality: Left;  CDE: 15.99  . CATARACT EXTRACTION W/PHACO  04/17/2011   Procedure: CATARACT EXTRACTION PHACO AND INTRAOCULAR LENS PLACEMENT (IOC);  Surgeon: Tonny Branch;  Location: [facility];  Service: Ophthalmology;  Laterality: Right;  CDE: 23.45  . CORONARY ARTERY BYPASS GRAFT  2001   LIMA to LAD, SVG to diagonal and ramus, SVG to OM, SVG to AM  . DUPUYTREN CONTRACTURE RELEASE Left 2009  . EP IMPLANTABLE DEVICE N/A 11/05/2014   Procedure: BiV ICD Insertion CRT-D;  Surgeon: Evans Lance, MD;  Location: [place] CV [facility];  Service: Cardiovascular;  Laterality: N/A;  . EP IMPLANTABLE DEVICE N/A 02/24/2015   Procedure: Lead Revision/Repair;  Surgeon: Evans Lance, MD;  Location: [place] [facility];  Service: Cardiovascular;  Laterality: N/A;  . EXTRACORPOREAL SHOCK WAVE LITHOTRIPSY  X 1  . EYE SURGERY    . FOOT SURGERY Left 1963   "gangrene"  . ICD LEAD REMOVAL  02/24/2015  . LOWER EXTREMITY ANGIOGRAM Bilateral 04/28/2015   Procedure: Lower Extremity Angiogram;  Surgeon: Rosetta Posner, MD;  Location: [place] CV LAB;  Service: Cardiovascular;  Laterality: Bilateral;  . PERIPHERAL VASCULAR CATHETERIZATION N/A 04/28/2015   Procedure: Abdominal Aortogram;  Surgeon: Rosetta Posner, MD;  Location: [place] CV LAB;  Service: Cardiovascular;  Laterality: N/A;        Home Medications    Prior to Admission medications   Medication Sig Start Date End Date Taking? Authorizing Provider  acetaminophen (TYLENOL) 500 MG tablet Take 1,000 mg  by mouth daily as needed for mild pain, moderate pain or headache (pain).     [provider]  allopurinol (ZYLOPRIM) 100 MG tablet Take 100 mg by mouth daily with supper.     [provider]  amLODipine (NORVASC) 5 MG tablet TAKE 1 TABLET BY MOUTH ONCE DAILY. PATIENT NEEDS TO BE SEEN. 02/11/18   Satira Sark, MD  aspirin EC 81 MG tablet Take 81 mg by mouth daily.    [provider]  carvedilol (COREG) 25 MG tablet Take 1 tablet (25 mg total) by mouth 2 (two) times daily with a meal. 02/11/18   Satira Sark, MD  furosemide (LASIX) 80 MG tablet Take 1 tablet (80 mg total) by mouth 2 (two) times daily. 07/25/17   Satira Sark, MD  gabapentin (NEURONTIN) 300 MG capsule Take 300 mg by mouth 2 (two) times daily.    [provider]  isosorbide mononitrate (IMDUR) 60 MG 24 hr tablet Take 1-2 tablets (60-120 mg total) by mouth 2 (two) times daily. 120 mg in the am, 60mg  in the  pm 02/11/18   Satira Sark,  MD  nitroGLYCERIN (NITROSTAT) 0.4 MG SL tablet PLACE 0.4 MG  UNDER THE TONGUE EVERY 5 MINUTES UP TO 3 DOSES AS NEEDED FOR CHEST PAIN. 04/24/18   Satira Sark, MD  potassium chloride (K-DUR) 10 MEQ tablet Take 2 tablets (20 mEq total) by mouth daily. 02/11/18   Satira Sark, MD  simvastatin (ZOCOR) 40 MG tablet Take 1 tablet (40 mg total) by mouth daily. 02/11/18   Satira Sark, MD  tamsulosin (FLOMAX) 0.4 MG CAPS capsule Take 1 capsule (0.4 mg total) by mouth daily after supper. 07/04/17   Orson Eva, MD    Family History Family History  Problem Relation Age of Onset  . Diabetes Mother        Type  I   . Varicose Veins Mother   . Heart disease Father 21       AAA  . AAA (abdominal aortic aneurysm) Father   . Hyperlipidemia Father   . Hypertension Father     Social History Social History   Tobacco Use  . Smoking status: Former Smoker    Packs/day: 1.00    Years: 25.00    Pack years: 25.00    Types: Cigarettes    Start  date: 02/28/1979    Last attempt to quit: 05/23/2007    Years since quitting: 11.0  . Smokeless tobacco: Never Used  Substance Use Topics  . Alcohol use: No    Alcohol/week: 0.0 standard drinks  . Drug use: No     Allergies   Ace inhibitors; Codeine; and Hydromorphone   Review of Systems Review of Systems  Constitutional: Negative for fever.  Respiratory: Negative for shortness of breath.   Cardiovascular: Negative for chest pain.  Gastrointestinal: Positive for abdominal pain and constipation. Negative for blood in stool and diarrhea.  Genitourinary: Negative for testicular pain.  Neurological: Negative for weakness and numbness.  All other systems reviewed and are negative.    Physical Exam Updated Vital Signs BP (!) 153/81   Pulse 60   Temp 97.8 F (36.6 C) (Oral)   Resp 20   Ht 1.854 m (6\' 1" )   Wt 106.6 kg   SpO2 97%   BMI 31.00 kg/m   Physical Exam CONSTITUTIONAL: elderly, uncomfortable appearing HEAD: Normocephalic/atraumatic EYES: EOMI/PERRL ENMT: Mucous membranes moist NECK: supple no meningeal signs SPINE/BACK:entire spine nontender CV: S1/S2 noted, no murmurs/rubs/gallops noted LUNGS: Lungs are clear to auscultation bilaterally, no apparent distress ABDOMEN: soft, umbilical hernia is noted without skin discoloration, minimal tenderness noted.  He has tenderness to palpation just left of the hernia.  There is no rebound or guarding.  There are bowel sounds noted. GU:no cva tenderness NEURO: Pt is awake/alert/appropriate, moves all extremitiesx4.  No facial droop.   EXTREMITIES: pulses normal/equal, full ROM SKIN: warm, color normal PSYCH: no abnormalities of mood noted, alert and oriented to situation   ED Treatments / Results  Labs (all labs ordered are listed, but only abnormal results are displayed) Labs Reviewed  CBC WITH DIFFERENTIAL/PLATELET - Abnormal; Notable for the following components:      Result Value   WBC 12.8 (*)    Neutro Abs 9.2  (*)    All other components within normal limits  COMPREHENSIVE METABOLIC PANEL - Abnormal; Notable for the following components:   Glucose, Bld 198 (*)    BUN 30 (*)    Creatinine, Ser 1.78 (*)    GFR calc non Af Amer 38 (*)    GFR calc Af Amer 44 (*)  All other components within normal limits  LIPASE, BLOOD - Abnormal; Notable for the following components:   Lipase 61 (*)    All other components within normal limits  URINALYSIS, ROUTINE W REFLEX MICROSCOPIC - Abnormal; Notable for the following components:   Color, Urine STRAW (*)    All other components within normal limits  I-STAT CG4 LACTIC ACID, ED    EKG None  Radiology Ct Abdomen Pelvis W Contrast  Result Date: 06/01/2018 CLINICAL DATA:  73 y/o  M; pain at site of umbilical hernia. EXAM: CT ABDOMEN AND PELVIS WITH CONTRAST TECHNIQUE: Multidetector CT imaging of the abdomen and pelvis was performed using the standard protocol following bolus administration of intravenous contrast. CONTRAST:  18mL ISOVUE-300 IOPAMIDOL (ISOVUE-300) INJECTION 61% COMPARISON:  07/07/2017 CT abdomen and pelvis. FINDINGS: Lower chest: No acute abnormality. Hepatobiliary: Multiple small stable liver cysts. Otherwise no focal liver abnormality is seen. No gallstones, gallbladder wall thickening, or biliary dilatation. Pancreas: Unremarkable. No pancreatic ductal dilatation or surrounding inflammatory changes. Spleen: Stable atrophic spleen. Adrenals/Urinary Tract: Adrenal glands are unremarkable. Multiple stable renal cysts. Stable scarring in upper pole of left kidney. Left renal atrophy. Stable left kidney interpolar nonobstructing 6 mm calyceal stone. Increased size of stone in the right renal pelvis measuring up to 25 mm. No hydronephrosis. Normal bladder. Stomach/Bowel: Stomach is within normal limits. Appendix appears normal. No evidence of bowel wall thickening, distention, or inflammatory changes. Sigmoid diverticulosis. Vascular/Lymphatic: Aorto  bi-iliac stent is stable and widely patent. No lymphadenopathy. Reproductive: Prostate is unremarkable. Other: Stable umbilical hernia containing fat. No inflammatory changes identified. Musculoskeletal: Avascular necrosis of the left greater than right femoral head. Lumbar spondylosis with advanced lower lumbar facet arthrosis. No acute osseous abnormality identified. IMPRESSION: 1. Stable umbilical hernia containing fat. No appreciable inflammatory changes. 2. Increased size of stone in the right renal pelvis measuring up to 25 mm. Stable left kidney nonobstructing stone. 3. Aorto bi-iliac stent is stable and patent. Electronically Signed   By: Kristine Garbe M.D.   On: 06/01/2018 04:07    Procedures Procedures  Medications Ordered in ED Medications  fentaNYL (SUBLIMAZE) injection 100 mcg (100 mcg Intravenous Given 06/01/18 0143)  iopamidol (ISOVUE-300) 61 % injection 80 mL (80 mLs Intravenous Contrast Given 06/01/18 0335)  ondansetron (ZOFRAN) injection 4 mg (4 mg Intravenous Given 06/01/18 0519)  fentaNYL (SUBLIMAZE) injection 100 mcg (100 mcg Intravenous Given 06/01/18 0520)     Initial Impression / Assessment and Plan / ED Course  I have reviewed the triage vital signs and the nursing notes.  Pertinent labs  results that were available during my care of the patient were reviewed by me and considered in my medical decision making (see chart for details).     1:50 AM Patient with history of AAA repair treated with stent grafting at [facility], also has a history of popliteal artery aneurysm requiring left femoral to above-knee bypass (Dr. Donnetta Hutching) Patient also has an obvious periumbilical hernia however at this time it has minimal tenderness.  Unclear cause of his abdominal pain.  He will need to undergo CT imaging due to his complex medical history.  He is currently hemodynamically appropriate, will follow closely 2:52 AM Patient with continued abdominal pain.  We will proceed  with CT imaging.  He reports he does not have an IV dye allergy.  His renal function should tolerate IV contrast Due to concern for acute abdominal/vascular emergency, I felt that ordering CT imaging of his abdomen pelvis outweighs any risk  of IV contrast exposure 5:59 AM CT imaging does not reveal any acute abdominal emergency.  He has a stable hernia by CT scan. Aortobiiliac stents are widely patent. On exam, his pain is mostly localized around the hernia.  By CT imaging is only fat-containing, but he does feel that it appears larger.  There is no discoloration or signs of any incarceration.  He would likely benefit from outpatient general surgery evaluation for this hernia 6:27 AM Lactate negative.  Patient taking p.o. fluids. I feel he is appropriate for discharge home.  Refer to general surgery.  We discussed strict ER return precautions. Final Clinical Impressions(s) / ED Diagnoses   Final diagnoses:  Umbilical hernia without obstruction and without gangrene    ED Discharge Orders         Ordered    HYDROcodone-acetaminophen (NORCO/VICODIN) 5-325 MG tablet  Every 6 hours PRN     06/01/18 0626    docusate sodium (COLACE) 100 MG capsule  Every 12 hours     06/01/18 0626           Ripley Fraise, MD 06/01/18 [phone]

## 2018-06-20 DIAGNOSIS — E785 Hyperlipidemia, unspecified: Secondary | ICD-10-CM | POA: Diagnosis not present

## 2018-06-20 DIAGNOSIS — I10 Essential (primary) hypertension: Secondary | ICD-10-CM | POA: Diagnosis not present

## 2018-06-20 DIAGNOSIS — I2510 Atherosclerotic heart disease of native coronary artery without angina pectoris: Secondary | ICD-10-CM | POA: Diagnosis not present

## 2018-06-20 DIAGNOSIS — Z1159 Encounter for screening for other viral diseases: Secondary | ICD-10-CM | POA: Diagnosis not present

## 2018-06-20 DIAGNOSIS — K5900 Constipation, unspecified: Secondary | ICD-10-CM | POA: Diagnosis not present

## 2018-06-20 DIAGNOSIS — E119 Type 2 diabetes mellitus without complications: Secondary | ICD-10-CM | POA: Diagnosis not present

## 2018-06-24 ENCOUNTER — Other Ambulatory Visit: Payer: Self-pay | Admitting: Cardiology

## 2018-07-18 ENCOUNTER — Ambulatory Visit (INDEPENDENT_AMBULATORY_CARE_PROVIDER_SITE_OTHER): Payer: Medicare Other | Admitting: Otolaryngology

## 2018-07-23 DIAGNOSIS — I10 Essential (primary) hypertension: Secondary | ICD-10-CM | POA: Diagnosis not present

## 2018-07-23 DIAGNOSIS — K5900 Constipation, unspecified: Secondary | ICD-10-CM | POA: Diagnosis not present

## 2018-07-23 DIAGNOSIS — E119 Type 2 diabetes mellitus without complications: Secondary | ICD-10-CM | POA: Diagnosis not present

## 2018-07-23 DIAGNOSIS — E785 Hyperlipidemia, unspecified: Secondary | ICD-10-CM | POA: Diagnosis not present

## 2018-07-23 NOTE — Progress Notes (Deleted)
Cardiology Office Note  Date: 07/23/2018   ID: [name], DOB Oct 20, 1945, MRN [id number]  PCP: Doree Albee, MD  Primary Cardiologist: Rozann Lesches, MD   No chief complaint on file.   History of Present Illness: [name] is a 73 y.o. male last seen in December 2019.  He has known multivessel CAD as well as graft failure as outlined in his most recent cardiac catheterization in 2016.  He did not have good revascularization options at that time and has been managed medically.  He sees Dr. Lovena Le in the device clinic, Keyser biventricular ICD in place.  Past Medical History:  Diagnosis Date  . AAA (abdominal aortic aneurysm) ([place])    Followed by Dr. Donnetta Hutching  . AICD (automatic cardioverter/defibrillator) present   . Angioedema    Secondary to ACE inhibitor  . Arthritis   . Bell palsy   . Carotid artery disease ([place])   . Coronary atherosclerosis of native coronary artery    Multivessel status post CABG  . Essential hypertension, benign   . Gout   . Hernia of abdominal wall   . Hypercholesteremia   . Ischemic cardiomyopathy    LVEF 45-50% 11/2011  . Migraines   . Myocardial infarction ([place])   . Nephrolithiasis   . PAD (peripheral artery disease) ([place])    Followed by Dr. Donnetta Hutching  . Sleep apnea   . Type 2 diabetes mellitus ([place])     Past Surgical History:  Procedure Laterality Date  . ABDOMINAL AORTIC ANEURYSM REPAIR  November 19, 2013   [facility] :  Dr. Sammuel Hines  . ABDOMINAL AORTIC ANEURYSM REPAIR  09-07-2014   [facility]  . BI-VENTRICULAR IMPLANTABLE CARDIOVERTER DEFIBRILLATOR  (CRT-D)  11/05/2014  . BYPASS GRAFT POPLITEAL TO POPLITEAL Left 06/08/2014   Procedure: BYPASS GRAFT FEMORAL ARTERY TO ABOVE KNEE POPLITEAL;  Surgeon: Rosetta Posner, MD;  Location: [place];  Service: Vascular;  Laterality: Left;  . CARDIAC CATHETERIZATION N/A 10/02/2014   Procedure: Left Heart Cath and Cors/Grafts Angiography;  Surgeon: Belva Crome, MD;  Location: [place] CV LAB;  Service: Cardiovascular;  Laterality: N/A;  . CARDIAC CATHETERIZATION  "several"  . CATARACT EXTRACTION W/PHACO  03/23/2011   Procedure: CATARACT EXTRACTION PHACO AND INTRAOCULAR LENS PLACEMENT (IOC);  Surgeon: Tonny Branch;  Location: [facility];  Service: Ophthalmology;  Laterality: Left;  CDE: 15.99  . CATARACT EXTRACTION W/PHACO  04/17/2011   Procedure: CATARACT EXTRACTION PHACO AND INTRAOCULAR LENS PLACEMENT (IOC);  Surgeon: Tonny Branch;  Location: [facility];  Service: Ophthalmology;  Laterality: Right;  CDE: 23.45  . CORONARY ARTERY BYPASS GRAFT  2001   LIMA to LAD, SVG to diagonal and ramus, SVG to OM, SVG to AM  . DUPUYTREN CONTRACTURE RELEASE Left 2009  . EP IMPLANTABLE DEVICE N/A 11/05/2014   Procedure: BiV ICD Insertion CRT-D;  Surgeon: Evans Lance, MD;  Location: [place] [facility] LAB;  Service: Cardiovascular;  Laterality: N/A;  . EP IMPLANTABLE DEVICE N/A 02/24/2015   Procedure: Lead Revision/Repair;  Surgeon: Evans Lance, MD;  Location: [place] [facility];  Service: Cardiovascular;  Laterality: N/A;  . EXTRACORPOREAL SHOCK WAVE LITHOTRIPSY  X 1  . EYE SURGERY    . FOOT SURGERY Left 1963   "gangrene"  . ICD LEAD REMOVAL  02/24/2015  . LOWER EXTREMITY ANGIOGRAM Bilateral 04/28/2015   Procedure: Lower Extremity Angiogram;  Surgeon: Rosetta Posner, MD;  Location: [place] CV LAB;  Service: Cardiovascular;  Laterality: Bilateral;  . PERIPHERAL VASCULAR CATHETERIZATION N/A 04/28/2015   Procedure: Abdominal Aortogram;  Surgeon: Rosetta Posner, MD;  Location: [place] CV LAB;  Service: Cardiovascular;  Laterality: N/A;    Current Outpatient Medications  Medication Sig Dispense Refill  . acetaminophen (TYLENOL) 500 MG tablet Take 1,000 mg by mouth daily as needed for mild pain, moderate pain or headache (pain).     Marland Kitchen allopurinol (ZYLOPRIM) 100 MG tablet Take 100 mg by mouth daily with supper.     Marland Kitchen amLODipine (NORVASC) 5 MG tablet TAKE 1 TABLET BY MOUTH ONCE DAILY. PATIENT  NEEDS TO BE SEEN. 90 tablet 3  . aspirin EC 81 MG tablet Take 81 mg by mouth daily.    . carvedilol (COREG) 25 MG tablet Take 1 tablet (25 mg total) by mouth 2 (two) times daily with a meal. 180 tablet 3  . docusate sodium (COLACE) 100 MG capsule Take 1 capsule (100 mg total) by mouth every 12 (twelve) hours. 60 capsule 0  . furosemide (LASIX) 80 MG tablet TAKE (1) TABLET TWICE DAILY. 60 tablet 0  . gabapentin (NEURONTIN) 300 MG capsule Take 300 mg by mouth 2 (two) times daily.    Marland Kitchen HYDROcodone-acetaminophen (NORCO/VICODIN) 5-325 MG tablet Take 1 tablet by mouth every 6 (six) hours as needed. 10 tablet 0  . isosorbide mononitrate (IMDUR) 60 MG 24 hr tablet Take 1-2 tablets (60-120 mg total) by mouth 2 (two) times daily. 120 mg in the am, 60mg  in the  pm 270 tablet 3  . nitroGLYCERIN (NITROSTAT) 0.4 MG SL tablet PLACE 0.4 MG  UNDER THE TONGUE EVERY 5 MINUTES UP TO 3 DOSES AS NEEDED FOR CHEST PAIN. 25 tablet 3  . potassium chloride (K-DUR) 10 MEQ tablet TAKE 2 TABLETS BY MOUTH ONCE DAILY. 60 tablet 0  . simvastatin (ZOCOR) 40 MG tablet Take 1 tablet (40 mg total) by mouth daily. 90 tablet 3  . tamsulosin (FLOMAX) 0.4 MG CAPS capsule Take 1 capsule (0.4 mg total) by mouth daily after supper. 30 capsule 0   No current facility-administered medications for this visit.    Allergies:  Ace inhibitors; Codeine; and Hydromorphone   Social History: The patient  reports that he quit smoking about 11 years ago. His smoking use included cigarettes. He started smoking about 39 years ago. He has a 25.00 pack-year smoking history. He has never used smokeless tobacco. He reports that he does not drink alcohol or use drugs.   Family History: The patient's family history includes AAA (abdominal aortic aneurysm) in his father; Diabetes in his mother; Heart disease (age of onset: 61) in his father; Hyperlipidemia in his father; Hypertension in his father; Varicose Veins in his mother.   ROS:  Please see the history  of present illness. Otherwise, complete review of systems is positive for {NONE DEFAULTED:18576::"none"}.  All other systems are reviewed and negative.   Physical Exam: VS:  There were no vitals taken for this visit., BMI There is no height or weight on file to calculate BMI.  Wt Readings from Last 3 Encounters:  06/01/18 235 lb (106.6 kg)  04/24/18 241 lb 9.6 oz (109.6 kg)  02/11/18 244 lb (110.7 kg)    General: Patient appears comfortable at rest. HEENT: Conjunctiva and lids normal, oropharynx clear with moist mucosa. Neck: Supple, no elevated JVP or carotid bruits, no thyromegaly. Lungs: Clear to auscultation, nonlabored breathing at rest. Cardiac: Regular rate and rhythm, no S3 or significant systolic murmur, no pericardial rub. Abdomen: Soft,  nontender, no hepatomegaly, bowel sounds present, no guarding or rebound. Extremities: No pitting edema, distal pulses 2+. Skin: Warm and dry. Musculoskeletal: No kyphosis. Neuropsychiatric: Alert and oriented x3, affect grossly appropriate.  ECG: I personally reviewed the tracing from November 2019 which showed dual-chamber pacing.  Recent Labwork: 06/01/2018: ALT 23; AST 22; BUN 30; Creatinine, Ser 1.78; Hemoglobin 14.9; Platelets 308; Potassium 3.9; Sodium 135     Component Value Date/Time   CHOL 118 10/16/2014 0715   TRIG 100 10/16/2014 0715   HDL 33 (L) 10/16/2014 0715   CHOLHDL 3.6 10/16/2014 0715   VLDL 20 10/16/2014 0715   LDLCALC 65 10/16/2014 0715    Other Studies Reviewed Today:  Echocardiogram 02/13/2018: Study Conclusions  - Left ventricle: The cavity size was normal. Wall thickness was increased in a pattern of mild LVH. Systolic function was mildly reduced. The estimated ejection fraction was in the range of 45% to 50%. There is akinesis of the basalinferolateral myocardium. Doppler parameters are consistent with abnormal left ventricular relaxation (grade 1 diastolic dysfunction). - Aortic valve: Mildly  to moderately calcified annulus. Trileaflet; mildly calcified leaflets. - Mitral valve: Mildly calcified annulus. There was trivial regurgitation. - Right ventricle: Pacer wire or catheter noted in right ventricle. - Right atrium: Central venous pressure (est): 3 mm Hg. - Atrial septum: No defect or patent foramen ovale was identified. - Tricuspid valve: There was trivial regurgitation. - Pericardium, extracardiac: There was no pericardial effusion.  Assessment and Plan:   Current medicines were reviewed with the patient today.  No orders of the defined types were placed in this encounter.   Disposition:  Signed, Satira Sark, MD, [facility] 07/23/2018 2:44 PM    [facility] [address], [place], [place] [address] Phone: [phone]; Fax: [phone]

## 2018-07-24 ENCOUNTER — Ambulatory Visit: Payer: Medicare Other | Admitting: Cardiology

## 2018-07-25 ENCOUNTER — Ambulatory Visit: Payer: Medicare Other | Admitting: Cardiology

## 2018-07-25 NOTE — Progress Notes (Deleted)
Cardiology Office Note  Date: 07/25/2018   ID: [name], DOB January 26, 1946, MRN [id number]  PCP: Austin Albee, MD  Primary Cardiologist: Austin Lesches, MD   No chief complaint on file.   History of Present Illness: [name] is a 73 y.o. male  last seen in December 2019.  He has known multivessel CAD as well as graft failure as outlined in his most recent cardiac catheterization in 2016.  He did not have good revascularization options at that time and has been managed medically.  He sees [name] in the device clinic, [place] biventricular ICD in place.  Past Medical History:  Diagnosis Date  . AAA (abdominal aortic aneurysm) ([name])    Followed by Dr. Donnetta [name]  . AICD (automatic cardioverter/defibrillator) present   . Angioedema    Secondary to ACE inhibitor  . Arthritis   . Bell palsy   . Carotid artery disease ([name])   . Coronary atherosclerosis of native coronary artery    Multivessel status post CABG  . Essential hypertension, benign   . Gout   . Hernia of abdominal wall   . Hypercholesteremia   . Ischemic cardiomyopathy    LVEF 45-50% 11/2011  . Migraines   . Myocardial infarction ([name])   . Nephrolithiasis   . PAD (peripheral artery disease) ([place])    Followed by Dr. Donnetta [name]  . Sleep apnea   . Type 2 diabetes mellitus ([place])     Past Surgical History:  Procedure Laterality Date  . ABDOMINAL AORTIC ANEURYSM REPAIR  November 19, 2013   [facility] :  Dr. Sammuel [name]  . ABDOMINAL AORTIC ANEURYSM REPAIR  09-07-2014   [name]  . BI-VENTRICULAR IMPLANTABLE CARDIOVERTER DEFIBRILLATOR  (CRT-D)  11/05/2014  . BYPASS GRAFT POPLITEAL TO POPLITEAL Left 06/08/2014   Procedure: BYPASS GRAFT FEMORAL ARTERY TO ABOVE KNEE POPLITEAL;  Surgeon: Austin Posner, MD;  Location: [place];  Service: Vascular;  Laterality: Left;  . CARDIAC CATHETERIZATION N/A 10/02/2014   Procedure: Left Heart Cath and Cors/Grafts Angiography;  Surgeon: Austin Crome, MD;  Location: [name];  Service: Cardiovascular;  Laterality: N/A;  . CARDIAC CATHETERIZATION  "several"  . CATARACT EXTRACTION W/PHACO  03/23/2011   Procedure: CATARACT EXTRACTION PHACO AND INTRAOCULAR LENS PLACEMENT (IOC);  Surgeon: [name];  Location: [facility];  Service: Ophthalmology;  Laterality: Left;  CDE: 15.99  . CATARACT EXTRACTION W/PHACO  04/17/2011   Procedure: CATARACT EXTRACTION PHACO AND INTRAOCULAR LENS PLACEMENT (IOC);  Surgeon: [name];  Location: [facility];  Service: Ophthalmology;  Laterality: Right;  CDE: 23.45  . CORONARY ARTERY BYPASS GRAFT  2001   LIMA to LAD, SVG to diagonal and ramus, SVG to OM, SVG to AM  . DUPUYTREN CONTRACTURE RELEASE Left 2009  . EP IMPLANTABLE DEVICE N/A 11/05/2014   Procedure: BiV ICD Insertion CRT-D;  Surgeon: Austin Lance, MD;  Location: [place] [facility];  Service: Cardiovascular;  Laterality: N/A;  . EP IMPLANTABLE DEVICE N/A 02/24/2015   Procedure: Lead Revision/Repair;  Surgeon: Austin Lance, MD;  Location: [place] [facility];  Service: Cardiovascular;  Laterality: N/A;  . EXTRACORPOREAL SHOCK WAVE LITHOTRIPSY  X 1  . EYE SURGERY    . FOOT SURGERY Left 1963   "gangrene"  . ICD LEAD REMOVAL  02/24/2015  . LOWER EXTREMITY ANGIOGRAM Bilateral 04/28/2015   Procedure: Lower Extremity Angiogram;  Surgeon: Austin Posner, MD;  Location: [place] [facility];  Service: Cardiovascular;  Laterality: Bilateral;  . PERIPHERAL VASCULAR CATHETERIZATION N/A 04/28/2015   Procedure: Abdominal Aortogram;  Surgeon: Austin Posner, MD;  Location: [place] [facility];  Service: Cardiovascular;  Laterality: N/A;    Current Outpatient Medications  Medication Sig Dispense Refill  . acetaminophen (TYLENOL) 500 MG tablet Take 1,000 mg by mouth daily as needed for mild pain, moderate pain or headache (pain).     Marland Kitchen allopurinol (ZYLOPRIM) 100 MG tablet Take 100 mg by mouth daily with supper.     Marland Kitchen amLODipine (NORVASC) 5 MG tablet TAKE 1 TABLET BY MOUTH ONCE DAILY. PATIENT  NEEDS TO BE SEEN. 90 tablet 3  . aspirin EC 81 MG tablet Take 81 mg by mouth daily.    . carvedilol (COREG) 25 MG tablet Take 1 tablet (25 mg total) by mouth 2 (two) times daily with a meal. 180 tablet 3  . docusate sodium (COLACE) 100 MG capsule Take 1 capsule (100 mg total) by mouth every 12 (twelve) hours. 60 capsule 0  . furosemide (LASIX) 80 MG tablet TAKE (1) TABLET TWICE DAILY. 60 tablet 0  . gabapentin (NEURONTIN) 300 MG capsule Take 300 mg by mouth 2 (two) times daily.    Marland Kitchen HYDROcodone-acetaminophen (NORCO/VICODIN) 5-325 MG tablet Take 1 tablet by mouth every 6 (six) hours as needed. 10 tablet 0  . isosorbide mononitrate (IMDUR) 60 MG 24 hr tablet Take 1-2 tablets (60-120 mg total) by mouth 2 (two) times daily. 120 mg in the am, 60mg  in the  pm 270 tablet 3  . nitroGLYCERIN (NITROSTAT) 0.4 MG SL tablet PLACE 0.4 MG  UNDER THE TONGUE EVERY 5 MINUTES UP TO 3 DOSES AS NEEDED FOR CHEST PAIN. 25 tablet 3  . potassium chloride (K-DUR) 10 MEQ tablet TAKE 2 TABLETS BY MOUTH ONCE DAILY. 60 tablet 0  . simvastatin (ZOCOR) 40 MG tablet Take 1 tablet (40 mg total) by mouth daily. 90 tablet 3  . tamsulosin (FLOMAX) 0.4 MG CAPS capsule Take 1 capsule (0.4 mg total) by mouth daily after supper. 30 capsule 0   No current facility-administered medications for this visit.    Allergies:  Ace inhibitors; Codeine; and Hydromorphone   Social History: The patient  reports that he quit smoking about 11 years ago. His smoking use included cigarettes. He started smoking about 39 years ago. He has a 25.00 pack-year smoking history. He has never used smokeless tobacco. He reports that he does not drink alcohol or use drugs.   Family History: The patient's family history includes AAA (abdominal aortic aneurysm) in his father; Diabetes in his mother; Heart disease (age of onset: 40) in his father; Hyperlipidemia in his father; Hypertension in his father; Varicose Veins in his mother.   ROS:  Please see the history  of present illness. Otherwise, complete review of systems is positive for {NONE DEFAULTED:18576::"none"}.  All other systems are reviewed and negative.   Physical Exam: VS:  There were no vitals taken for this visit., BMI There is no height or weight on file to calculate BMI.  Wt Readings from Last 3 Encounters:  06/01/18 235 lb (106.6 kg)  04/24/18 241 lb 9.6 oz (109.6 kg)  02/11/18 244 lb (110.7 kg)    General: Patient appears comfortable at rest. HEENT: Conjunctiva and lids normal, oropharynx clear with moist mucosa. Neck: Supple, no elevated JVP or carotid bruits, no thyromegaly. Lungs: Clear to auscultation, nonlabored breathing at rest. Cardiac: Regular rate and rhythm, no S3 or significant systolic murmur, no pericardial rub. Abdomen: Soft,  nontender, no hepatomegaly, bowel sounds present, no guarding or rebound. Extremities: No pitting edema, distal pulses 2+. Skin: Warm and dry. Musculoskeletal: No kyphosis. Neuropsychiatric: Alert and oriented x3, affect grossly appropriate.  ECG: I personally reviewed the tracing from November 2019 which showed dual-chamber pacing.  Recent Labwork: 06/01/2018: ALT 23; AST 22; BUN 30; Creatinine, Ser 1.78; Hemoglobin 14.9; Platelets 308; Potassium 3.9; Sodium 135   Other Studies Reviewed Today:  Echocardiogram 02/13/2018: Study Conclusions  - Left ventricle: The cavity size was normal. Wall thickness was increased in a pattern of mild LVH. Systolic function was mildly reduced. The estimated ejection fraction was in the range of 45% to 50%. There is akinesis of the basalinferolateral myocardium. Doppler parameters are consistent with abnormal left ventricular relaxation (grade 1 diastolic dysfunction). - Aortic valve: Mildly to moderately calcified annulus. Trileaflet; mildly calcified leaflets. - Mitral valve: Mildly calcified annulus. There was trivial regurgitation. - Right ventricle: Pacer wire or catheter noted in  right ventricle. - Right atrium: Central venous pressure (est): 3 mm Hg. - Atrial septum: No defect or patent foramen ovale was identified. - Tricuspid valve: There was trivial regurgitation. - Pericardium, extracardiac: There was no pericardial effusion.  Assessment and Plan:   Current medicines were reviewed with the patient today.  No orders of the defined types were placed in this encounter.   Disposition:  Signed, Satira Sark, MD, [facility] 07/25/2018 8:16 AM    [facility] [place]. [address], [place], [place] [address] Phone: [phone]; Fax: [phone]

## 2018-08-23 ENCOUNTER — Other Ambulatory Visit: Payer: Self-pay | Admitting: Cardiology

## 2018-08-26 ENCOUNTER — Ambulatory Visit (INDEPENDENT_AMBULATORY_CARE_PROVIDER_SITE_OTHER): Payer: Medicare Other | Admitting: *Deleted

## 2018-08-26 ENCOUNTER — Other Ambulatory Visit: Payer: Self-pay

## 2018-08-26 DIAGNOSIS — I255 Ischemic cardiomyopathy: Secondary | ICD-10-CM | POA: Diagnosis not present

## 2018-08-26 DIAGNOSIS — I5022 Chronic systolic (congestive) heart failure: Secondary | ICD-10-CM

## 2018-08-26 LAB — CUP PACEART REMOTE DEVICE CHECK
Battery Remaining Longevity: 26 mo
Battery Remaining Percentage: 41 %
Battery Voltage: 2.9 V
Brady Statistic AP VP Percent: 52 %
Brady Statistic AP VS Percent: 1 %
Brady Statistic AS VP Percent: 43 %
Brady Statistic AS VS Percent: 1.2 %
Brady Statistic RA Percent Paced: 47 %
Date Time Interrogation Session: 20200406060016
HighPow Impedance: 73 Ohm
HighPow Impedance: 73 Ohm
Implantable Lead Implant Date: 20160616
Implantable Lead Implant Date: 20161005
Implantable Lead Implant Date: 20161005
Implantable Lead Location: 753858
Implantable Lead Location: 753859
Implantable Lead Location: 753860
Implantable Lead Model: 7122
Implantable Pulse Generator Implant Date: 20160616
Lead Channel Impedance Value: 400 Ohm
Lead Channel Impedance Value: 530 Ohm
Lead Channel Impedance Value: 760 Ohm
Lead Channel Pacing Threshold Amplitude: 0.75 V
Lead Channel Pacing Threshold Amplitude: 1 V
Lead Channel Pacing Threshold Amplitude: 2.375 V
Lead Channel Pacing Threshold Pulse Width: 0.5 ms
Lead Channel Pacing Threshold Pulse Width: 0.5 ms
Lead Channel Pacing Threshold Pulse Width: 1 ms
Lead Channel Sensing Intrinsic Amplitude: 1.2 mV
Lead Channel Sensing Intrinsic Amplitude: 11.6 mV
Lead Channel Setting Pacing Amplitude: 2 V
Lead Channel Setting Pacing Amplitude: 2 V
Lead Channel Setting Pacing Amplitude: 3.375
Lead Channel Setting Pacing Pulse Width: 0.5 ms
Lead Channel Setting Pacing Pulse Width: 1 ms
Lead Channel Setting Sensing Sensitivity: 0.5 mV
Pulse Gen Serial Number: 7288352

## 2018-09-03 NOTE — Progress Notes (Signed)
Remote ICD transmission.   

## 2018-09-05 ENCOUNTER — Ambulatory Visit (INDEPENDENT_AMBULATORY_CARE_PROVIDER_SITE_OTHER): Payer: Medicare Other | Admitting: Nurse Practitioner

## 2018-09-20 ENCOUNTER — Other Ambulatory Visit: Payer: Self-pay | Admitting: Cardiology

## 2018-10-21 ENCOUNTER — Other Ambulatory Visit: Payer: Self-pay | Admitting: Cardiology

## 2018-11-25 ENCOUNTER — Ambulatory Visit (INDEPENDENT_AMBULATORY_CARE_PROVIDER_SITE_OTHER): Payer: Medicare Other | Admitting: *Deleted

## 2018-11-25 DIAGNOSIS — I255 Ischemic cardiomyopathy: Secondary | ICD-10-CM

## 2018-11-25 DIAGNOSIS — I5022 Chronic systolic (congestive) heart failure: Secondary | ICD-10-CM

## 2018-11-25 LAB — CUP PACEART REMOTE DEVICE CHECK
Date Time Interrogation Session: 20200706175424
Implantable Lead Implant Date: 20160616
Implantable Lead Implant Date: 20161005
Implantable Lead Implant Date: 20161005
Implantable Lead Location: 753858
Implantable Lead Location: 753859
Implantable Lead Location: 753860
Implantable Lead Model: 7122
Implantable Pulse Generator Implant Date: 20160616
Pulse Gen Serial Number: 7288352

## 2018-12-06 ENCOUNTER — Encounter: Payer: Self-pay | Admitting: Cardiology

## 2018-12-06 NOTE — Progress Notes (Signed)
Remote ICD transmission.   

## 2019-01-23 ENCOUNTER — Telehealth (INDEPENDENT_AMBULATORY_CARE_PROVIDER_SITE_OTHER): Payer: Self-pay

## 2019-01-23 ENCOUNTER — Other Ambulatory Visit (INDEPENDENT_AMBULATORY_CARE_PROVIDER_SITE_OTHER): Payer: Self-pay | Admitting: Nurse Practitioner

## 2019-01-23 ENCOUNTER — Other Ambulatory Visit (INDEPENDENT_AMBULATORY_CARE_PROVIDER_SITE_OTHER): Payer: Self-pay | Admitting: Internal Medicine

## 2019-01-23 DIAGNOSIS — E118 Type 2 diabetes mellitus with unspecified complications: Secondary | ICD-10-CM

## 2019-01-23 MED ORDER — GLIPIZIDE 10 MG PO TABS: 10.0000 mg | ORAL_TABLET | Freq: Two times a day (BID) | ORAL | 1 refills | Status: DC

## 2019-01-23 NOTE — Progress Notes (Signed)
I have refilled this man's glipizide prescription.

## 2019-01-23 NOTE — Telephone Encounter (Signed)
Pt called and is requesting a refill on Glipizide.  He said he called the Pharmacy and they do not have the refill and he is out.

## 2019-01-28 ENCOUNTER — Other Ambulatory Visit (INDEPENDENT_AMBULATORY_CARE_PROVIDER_SITE_OTHER): Payer: Self-pay | Admitting: Nurse Practitioner

## 2019-01-31 ENCOUNTER — Other Ambulatory Visit: Payer: Self-pay | Admitting: Cardiology

## 2019-02-12 ENCOUNTER — Ambulatory Visit (INDEPENDENT_AMBULATORY_CARE_PROVIDER_SITE_OTHER): Payer: Medicare Other | Admitting: Internal Medicine

## 2019-02-12 ENCOUNTER — Encounter (INDEPENDENT_AMBULATORY_CARE_PROVIDER_SITE_OTHER): Payer: Self-pay | Admitting: Internal Medicine

## 2019-02-12 ENCOUNTER — Other Ambulatory Visit: Payer: Self-pay

## 2019-02-12 VITALS — BP 124/60 | HR 72 | Ht 73.0 in | Wt 252.2 lb

## 2019-02-12 DIAGNOSIS — E118 Type 2 diabetes mellitus with unspecified complications: Secondary | ICD-10-CM | POA: Diagnosis not present

## 2019-02-12 DIAGNOSIS — I10 Essential (primary) hypertension: Secondary | ICD-10-CM | POA: Diagnosis not present

## 2019-02-12 DIAGNOSIS — E559 Vitamin D deficiency, unspecified: Secondary | ICD-10-CM

## 2019-02-12 DIAGNOSIS — E782 Mixed hyperlipidemia: Secondary | ICD-10-CM

## 2019-02-12 DIAGNOSIS — E669 Obesity, unspecified: Secondary | ICD-10-CM | POA: Diagnosis not present

## 2019-02-12 HISTORY — DX: Morbid (severe) obesity due to excess calories: E66.01

## 2019-02-12 NOTE — Patient Instructions (Signed)
[name] Dietary Recommendations for Weight Loss What to Avoid . Avoid added sugars o Often added sugar can be found in processed foods such as many condiments, dry cereals, cakes, cookies, chips, crisps, crackers, candies, sweetened drinks, etc.  o Read labels and AVOID/DECREASE use of foods with the following in their ingredient list: Sugar, fructose, high fructose corn syrup, sucrose, glucose, maltose, dextrose, molasses, cane sugar, brown sugar, any type of syrup, agave nectar, etc.   . Avoid snacking in between meals . Avoid foods made with flour o If you are going to eat food made with flour, choose those made with whole-grains; and, minimize your consumption as much as is tolerable . Avoid processed foods o These foods are generally stocked in the middle of the grocery store. Focus on shopping on the perimeter of the grocery.  What to Include . Vegetables o GREEN LEAFY VEGETABLES: Kale, spinach, mustard greens, collard greens, cabbage, broccoli, etc. o OTHER: Asparagus, cauliflower, eggplant, carrots, peas, Brussel sprouts, tomatoes, bell peppers, zucchini, beets, cucumbers, etc. . Grains, seeds, and legumes o Beans: kidney beans, black eyed peas, garbanzo beans, black beans, pinto beans, etc. o Whole, unrefined grains: brown rice, barley, bulgur, oatmeal, etc. . Healthy fats  o Avoid highly processed fats such as vegetable oil o Examples of healthy fats: avocado, olives, virgin olive oil, dark chocolate (?72% Cocoa), nuts (peanuts, almonds, walnuts, cashews, pecans, etc.) . Low - Moderate Intake of Animal Sources of Protein o Meat sources: chicken, turkey, salmon, tuna. Limit to 4 ounces of meat at one time. o Consider limiting dairy sources, but when choosing dairy focus on: PLAIN Greek yogurt, cottage cheese, high-protein milk . Fruit o Choose berries  When to Eat . Intermittent Fasting: o Choosing not to eat for a specific time period, but DO FOCUS ON HYDRATION  when fasting o Multiple Techniques: - Time Restricted Eating: eat 3 meals in a day, each meal lasting no more than 60 minutes, no snacks between meals - 16-18 hour fast: fast for 16 to 18 hours up to 7 days a week. Often suggested to start with 2-3 nonconsecutive days per week.  . Remember the time you sleep is counted as fasting.  . Examples of eating schedule: Fast from 7:00pm-11:00am. Eat between 11:00am-7:00pm.  - 24-hour fast: fast for 24 hours up to every other day. Often suggested to start with 1 day per week . Remember the time you sleep is counted as fasting . Examples of eating schedule:  o Eating day: eat 2-3 meals on your eating day. If doing 2 meals, each meal should last no more than 90 minutes. If doing 3 meals, each meal should last no more than 60 minutes. Finish last meal by 7:00pm. o Fasting day: Fast until 7:00pm.  o IF YOU FEEL UNWELL FOR ANY REASON/IN ANY WAY WHEN FASTING, STOP FASTING BY EATING A NUTRITIOUS SNACK OR LIGHT MEAL o ALWAYS FOCUS ON HYDRATION DURING FASTS - Acceptable Hydration sources: water, broths, tea/coffee (black tea/coffee is best but using a small amount of whole-fat dairy products in coffee/tea is acceptable).  - Poor Hydration Sources: anything with sugar or artificial sweeteners added to it  These recommendations have been developed for patients that are actively receiving medical care from either Dr. Elira Colasanti or [facility] Gray, DNP, NP-C at [facility]. These recommendations are developed for patients with specific medical conditions and are not meant to be distributed or used by others that are not actively receiving care from either provider listed   above at [facility]. It is not appropriate to participate in the above eating plans without proper medical supervision.   Reference: Fung, J. The obesity code. [place]: [place]; 2016.   

## 2019-02-12 NOTE — Progress Notes (Signed)
Wellness Office Visit  Subjective:  Patient ID: [name], male    DOB: 08/05/1945  Age: 73 y.o. MRN: [id number]  CC: This man comes in for follow-up of his multiple medical problems including diabetes, hypertension, cardiomyopathy, hyperlipidemia and vitamin D deficiency. HPI  He is frustrated that he has gained weight, in fact approximately 10 pounds.  He sits at home and does not exercise much and tends to eat unhealthy food. He continues to take his diabetic medications faithfully.  He denies any polyuria polydipsia. He continues to take all his cardiac medications as prescribed.  He denies any chest pain, increasing dyspnea. He is also taking vitamin D3 supplementation for vitamin D deficiency. He continues with statin therapy for hyperlipidemia in the face of diabetes and coronary artery disease. He is concerned about the possibility of hemochromatosis since apparently his father had it.  Liver enzymes in the past on this patient have been normal. Past Medical History:  Diagnosis Date  . AAA (abdominal aortic aneurysm) ([place])    Followed by Dr. Donnetta Hutching  . AICD (automatic cardioverter/defibrillator) present   . Angioedema    Secondary to ACE inhibitor  . Arthritis   . Bell palsy   . Carotid artery disease ([place])   . Coronary atherosclerosis of native coronary artery    Multivessel status post CABG  . Essential hypertension, benign   . Gout   . Hernia of abdominal wall   . Hypercholesteremia   . Ischemic cardiomyopathy    LVEF 45-50% 11/2011  . Migraines   . Morbid obesity ([place]) 02/12/2019  . Myocardial infarction ([place])   . Nephrolithiasis   . PAD (peripheral artery disease) ([place])    Followed by Dr. Donnetta Hutching  . Sleep apnea   . Type 2 diabetes mellitus ([facility])       Family History  Problem Relation Age of Onset  . Diabetes Mother        Type  I   . Varicose Veins Mother   . Heart disease Father 41       AAA  . AAA (abdominal aortic aneurysm) Father   .  Hyperlipidemia Father   . 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     Current Meds  Medication Sig  . allopurinol (ZYLOPRIM) 100 MG tablet Take 100 mg by mouth daily with supper.   Marland Kitchen amLODipine (NORVASC) 5 MG tablet TAKE 1 TABLET BY MOUTH ONCE DAILY. PATIENT NEEDS TO BE SEEN.  Marland Kitchen aspirin EC 81 MG tablet Take 81 mg by mouth daily.  . carvedilol (COREG) 25 MG tablet TAKE (1) TABLET TWICE DAILY.  Marland Kitchen Cholecalciferol (VITAMIN D-3) 125 MCG (5000 UT) TABS Take 1 tablet by mouth daily.  . furosemide (LASIX) 80 MG tablet TAKE (1) TABLET TWICE DAILY.  Marland Kitchen gabapentin (NEURONTIN) 300 MG capsule Take 300 mg by mouth 2 (two) times daily.  Marland Kitchen glipiZIDE (GLUCOTROL) 10 MG tablet TAKE (1) TABLET TWICE DAILY.  . isosorbide mononitrate (IMDUR) 60 MG 24 hr tablet Take 1-2 tablets (60-120 mg total) by mouth 2 (two) times daily. 120 mg in the am, 60mg  in the  pm  . nitroGLYCERIN (NITROSTAT) 0.4 MG SL tablet PLACE 0.4 MG  UNDER THE TONGUE EVERY 5 MINUTES UP TO 3 DOSES AS NEEDED FOR CHEST PAIN.  Marland Kitchen potassium chloride (K-DUR) 10 MEQ tablet TAKE 2 TABLETS BY MOUTH ONCE DAILY.  . simvastatin (ZOCOR) 40 MG tablet Take  1 tablet (40 mg total) by mouth daily.  . TRULICITY [id number] 0000000 SOPN ONE INJECTION (0.5ML) INTO SKIN EVERY WEEK.     Nutrition  It does appear that he does tend to do intermittent fasting about 16 hours but not every day. Sleep  Adequate.  Exercise  None. Bio Identical Hormones  None.  Objective:   Today's Vitals: BP 124/60   Pulse 72   Ht 6\' 1"  (1.854 m)   Wt 252 lb 3.2 oz (114.4 kg)   BMI 33.27 kg/m  Vitals with BMI 02/12/2019 06/01/2018 06/01/2018  Height 6\' 1"  - -  Weight 252 lbs 3 oz - -  BMI [id number] - -  Systolic [id number]  Diastolic 60 80 75  Pulse 72 63 71     Physical Exam   He looks systemically well but he has gained about 10 pounds in weight since the last time I saw him.  He is  alert and orientated without any focal neurological signs.  Blood pressure is well controlled.    Assessment   1. Essential hypertension   2. Controlled type 2 diabetes mellitus with complication, without long-term current use of insulin ([place])   3. Mixed hyperlipidemia   4. Obesity (BMI 30.0-34.9)   5. Vitamin D deficiency disease       Tests ordered Orders Placed This Encounter  Procedures  . COMPLETE METABOLIC PANEL WITH GFR  . Hemoglobin A1c  . VITAMIN D 25 Hydroxy (Vit-D Deficiency, Fractures)  . Lipid panel  . Ferritin     Plan: 1. He will continue with all medications for his chronic conditions above. 2. Blood work is ordered as above. 3. Further recommendations will depend on blood results and I will have him follow-up with Sarah in about 3 months time.     Doree Albee, MD

## 2019-02-13 LAB — COMPLETE METABOLIC PANEL WITH GFR
AG Ratio: 1.5 (calc) (ref 1.0–2.5)
ALT: 18 U/L (ref 9–46)
AST: 20 U/L (ref 10–35)
Albumin: 3.9 g/dL (ref 3.6–5.1)
Alkaline phosphatase (APISO): 62 U/L (ref 35–144)
BUN/Creatinine Ratio: 10 (calc) (ref 6–22)
BUN: 18 mg/dL (ref 7–25)
CO2: 27 mmol/L (ref 20–32)
Calcium: 9.1 mg/dL (ref 8.6–10.3)
Chloride: 105 mmol/L (ref 98–110)
Creat: 1.86 mg/dL — ABNORMAL HIGH (ref 0.70–1.18)
GFR, Est African American: 42 mL/min/{1.73_m2} — ABNORMAL LOW (ref >=60)
GFR, Est Non African American: 36 mL/min/{1.73_m2} — ABNORMAL LOW (ref >=60)
Globulin: 2.6 g/dL (calc) (ref 1.9–3.7)
Glucose, Bld: 103 mg/dL — ABNORMAL HIGH (ref 65–99)
Potassium: 4.5 mmol/L (ref 3.5–5.3)
Sodium: 141 mmol/L (ref 135–146)
Total Bilirubin: 0.5 mg/dL (ref 0.2–1.2)
Total Protein: 6.5 g/dL (ref 6.1–8.1)

## 2019-02-13 LAB — HEMOGLOBIN A1C
Hgb A1c MFr Bld: 6.8 % of total Hgb — ABNORMAL HIGH (ref <5.7)
Mean Plasma Glucose: 148 (calc)
eAG (mmol/L): 8.2 (calc)

## 2019-02-13 LAB — LIPID PANEL
Cholesterol: 139 mg/dL (ref <200)
HDL: 33 mg/dL — ABNORMAL LOW (ref >=40)
LDL Cholesterol (Calc): 74 mg/dL (calc)
Non-HDL Cholesterol (Calc): 106 mg/dL (calc) (ref <130)
Total CHOL/HDL Ratio: 4.2 (calc) (ref <5.0)
Triglycerides: 228 mg/dL — ABNORMAL HIGH (ref <150)

## 2019-02-13 LAB — VITAMIN D 25 HYDROXY (VIT D DEFICIENCY, FRACTURES): Vit D, 25-Hydroxy: 29 ng/mL — ABNORMAL LOW (ref 30–100)

## 2019-02-13 LAB — FERRITIN: Ferritin: 140 ng/mL (ref 24–380)

## 2019-02-13 NOTE — Progress Notes (Signed)
Your diabetes, kidney function, cholesterol are fairly stable.  Your vitamin D levels were extremely low.  Make sure you are taking vitamin D3 10,000 units daily. You will see the ferritin level is normal which does NOT  indicate hemochromatosis.

## 2019-02-24 ENCOUNTER — Ambulatory Visit (INDEPENDENT_AMBULATORY_CARE_PROVIDER_SITE_OTHER): Payer: Medicare Other | Admitting: *Deleted

## 2019-02-24 DIAGNOSIS — I447 Left bundle-branch block, unspecified: Secondary | ICD-10-CM

## 2019-02-24 DIAGNOSIS — I5032 Chronic diastolic (congestive) heart failure: Secondary | ICD-10-CM | POA: Diagnosis not present

## 2019-02-25 LAB — CUP PACEART REMOTE DEVICE CHECK
Battery Remaining Longevity: 23 mo
Battery Remaining Percentage: 34 %
Battery Voltage: 2.9 V
Brady Statistic AP VP Percent: 44 %
Brady Statistic AP VS Percent: 1 %
Brady Statistic AS VP Percent: 50 %
Brady Statistic AS VS Percent: 1.4 %
Brady Statistic RA Percent Paced: 40 %
Date Time Interrogation Session: 20201005071827
HighPow Impedance: 73 Ohm
HighPow Impedance: 73 Ohm
Implantable Lead Implant Date: 20160616
Implantable Lead Implant Date: 20161005
Implantable Lead Implant Date: 20161005
Implantable Lead Location: 753858
Implantable Lead Location: 753859
Implantable Lead Location: 753860
Implantable Lead Model: 7122
Implantable Pulse Generator Implant Date: 20160616
Lead Channel Impedance Value: 410 Ohm
Lead Channel Impedance Value: 540 Ohm
Lead Channel Impedance Value: 800 Ohm
Lead Channel Pacing Threshold Amplitude: 0.625 V
Lead Channel Pacing Threshold Amplitude: 1 V
Lead Channel Pacing Threshold Amplitude: 2.25 V
Lead Channel Pacing Threshold Pulse Width: 0.5 ms
Lead Channel Pacing Threshold Pulse Width: 0.5 ms
Lead Channel Pacing Threshold Pulse Width: 1 ms
Lead Channel Sensing Intrinsic Amplitude: 11.5 mV
Lead Channel Sensing Intrinsic Amplitude: 2.3 mV
Lead Channel Setting Pacing Amplitude: 2 V
Lead Channel Setting Pacing Amplitude: 2 V
Lead Channel Setting Pacing Amplitude: 3.25 V
Lead Channel Setting Pacing Pulse Width: 0.5 ms
Lead Channel Setting Pacing Pulse Width: 1 ms
Lead Channel Setting Sensing Sensitivity: 0.5 mV
Pulse Gen Serial Number: 7288352

## 2019-03-03 ENCOUNTER — Other Ambulatory Visit: Payer: Self-pay | Admitting: Cardiology

## 2019-03-05 NOTE — Progress Notes (Signed)
Remote ICD transmission.   

## 2019-03-25 ENCOUNTER — Other Ambulatory Visit (INDEPENDENT_AMBULATORY_CARE_PROVIDER_SITE_OTHER): Payer: Self-pay | Admitting: Internal Medicine

## 2019-04-01 ENCOUNTER — Other Ambulatory Visit: Payer: Self-pay | Admitting: Cardiology

## 2019-04-23 ENCOUNTER — Other Ambulatory Visit: Payer: Self-pay | Admitting: Cardiology

## 2019-04-23 ENCOUNTER — Other Ambulatory Visit (INDEPENDENT_AMBULATORY_CARE_PROVIDER_SITE_OTHER): Payer: Self-pay | Admitting: Internal Medicine

## 2019-05-05 ENCOUNTER — Other Ambulatory Visit: Payer: Self-pay | Admitting: Cardiology

## 2019-05-08 ENCOUNTER — Other Ambulatory Visit: Payer: Self-pay

## 2019-05-08 ENCOUNTER — Telehealth (INDEPENDENT_AMBULATORY_CARE_PROVIDER_SITE_OTHER): Payer: Self-pay | Admitting: Internal Medicine

## 2019-05-08 ENCOUNTER — Ambulatory Visit (INDEPENDENT_AMBULATORY_CARE_PROVIDER_SITE_OTHER)
Admission: EM | Admit: 2019-05-08 | Discharge: 2019-05-08 | Disposition: A | Discharge status: Home / Self Care | Payer: Medicare Other | Source: Home / Self Care

## 2019-05-08 ENCOUNTER — Telehealth (INDEPENDENT_AMBULATORY_CARE_PROVIDER_SITE_OTHER): Payer: Self-pay

## 2019-05-08 ENCOUNTER — Telehealth: Payer: Self-pay | Admitting: Cardiology

## 2019-05-08 ENCOUNTER — Encounter (HOSPITAL_COMMUNITY): Payer: Self-pay | Admitting: Internal Medicine

## 2019-05-08 ENCOUNTER — Emergency Department (HOSPITAL_COMMUNITY): Payer: Medicare Other

## 2019-05-08 ENCOUNTER — Inpatient Hospital Stay (HOSPITAL_COMMUNITY)
Admission: EM | Admit: 2019-05-08 | Discharge: 2019-05-10 | DRG: 191 | Disposition: A | Discharge status: Home / Self Care | Payer: Medicare Other | Attending: Family Medicine | Admitting: Family Medicine

## 2019-05-08 ENCOUNTER — Inpatient Hospital Stay (HOSPITAL_COMMUNITY): Payer: Medicare Other

## 2019-05-08 DIAGNOSIS — Z951 Presence of aortocoronary bypass graft: Secondary | ICD-10-CM

## 2019-05-08 DIAGNOSIS — I5042 Chronic combined systolic (congestive) and diastolic (congestive) heart failure: Secondary | ICD-10-CM | POA: Diagnosis present

## 2019-05-08 DIAGNOSIS — R609 Edema, unspecified: Secondary | ICD-10-CM

## 2019-05-08 DIAGNOSIS — I2510 Atherosclerotic heart disease of native coronary artery without angina pectoris: Secondary | ICD-10-CM | POA: Diagnosis present

## 2019-05-08 DIAGNOSIS — N183 Chronic kidney disease, stage 3 unspecified: Secondary | ICD-10-CM | POA: Diagnosis present

## 2019-05-08 DIAGNOSIS — E7800 Pure hypercholesterolemia, unspecified: Secondary | ICD-10-CM | POA: Diagnosis present

## 2019-05-08 DIAGNOSIS — R778 Other specified abnormalities of plasma proteins: Secondary | ICD-10-CM

## 2019-05-08 DIAGNOSIS — R0789 Other chest pain: Secondary | ICD-10-CM

## 2019-05-08 DIAGNOSIS — Z87891 Personal history of nicotine dependence: Secondary | ICD-10-CM

## 2019-05-08 DIAGNOSIS — Z20828 Contact with and (suspected) exposure to other viral communicable diseases: Secondary | ICD-10-CM | POA: Diagnosis present

## 2019-05-08 DIAGNOSIS — R0902 Hypoxemia: Secondary | ICD-10-CM | POA: Diagnosis present

## 2019-05-08 DIAGNOSIS — R9431 Abnormal electrocardiogram [ECG] [EKG]: Secondary | ICD-10-CM | POA: Diagnosis not present

## 2019-05-08 DIAGNOSIS — J441 Chronic obstructive pulmonary disease with (acute) exacerbation: Principal | ICD-10-CM | POA: Diagnosis present

## 2019-05-08 DIAGNOSIS — Z6833 Body mass index (BMI) 33.0-33.9, adult: Secondary | ICD-10-CM

## 2019-05-08 DIAGNOSIS — I5022 Chronic systolic (congestive) heart failure: Secondary | ICD-10-CM | POA: Diagnosis present

## 2019-05-08 DIAGNOSIS — R0602 Shortness of breath: Secondary | ICD-10-CM

## 2019-05-08 DIAGNOSIS — M109 Gout, unspecified: Secondary | ICD-10-CM | POA: Diagnosis present

## 2019-05-08 DIAGNOSIS — G4730 Sleep apnea, unspecified: Secondary | ICD-10-CM | POA: Diagnosis present

## 2019-05-08 DIAGNOSIS — E1122 Type 2 diabetes mellitus with diabetic chronic kidney disease: Secondary | ICD-10-CM | POA: Diagnosis present

## 2019-05-08 DIAGNOSIS — M1990 Unspecified osteoarthritis, unspecified site: Secondary | ICD-10-CM | POA: Diagnosis present

## 2019-05-08 DIAGNOSIS — I10 Essential (primary) hypertension: Secondary | ICD-10-CM | POA: Diagnosis present

## 2019-05-08 DIAGNOSIS — M7989 Other specified soft tissue disorders: Secondary | ICD-10-CM

## 2019-05-08 DIAGNOSIS — Z961 Presence of intraocular lens: Secondary | ICD-10-CM | POA: Diagnosis present

## 2019-05-08 DIAGNOSIS — R600 Localized edema: Secondary | ICD-10-CM | POA: Diagnosis not present

## 2019-05-08 DIAGNOSIS — I130 Hypertensive heart and chronic kidney disease with heart failure and stage 1 through stage 4 chronic kidney disease, or unspecified chronic kidney disease: Secondary | ICD-10-CM | POA: Diagnosis present

## 2019-05-08 DIAGNOSIS — I252 Old myocardial infarction: Secondary | ICD-10-CM | POA: Diagnosis not present

## 2019-05-08 DIAGNOSIS — R7989 Other specified abnormal findings of blood chemistry: Secondary | ICD-10-CM | POA: Diagnosis present

## 2019-05-08 DIAGNOSIS — E1152 Type 2 diabetes mellitus with diabetic peripheral angiopathy with gangrene: Secondary | ICD-10-CM | POA: Diagnosis present

## 2019-05-08 DIAGNOSIS — Z888 Allergy status to other drugs, medicaments and biological substances status: Secondary | ICD-10-CM

## 2019-05-08 DIAGNOSIS — Z23 Encounter for immunization: Secondary | ICD-10-CM | POA: Diagnosis not present

## 2019-05-08 DIAGNOSIS — R2242 Localized swelling, mass and lump, left lower limb: Secondary | ICD-10-CM

## 2019-05-08 DIAGNOSIS — I714 Abdominal aortic aneurysm, without rupture: Secondary | ICD-10-CM | POA: Diagnosis present

## 2019-05-08 DIAGNOSIS — Z9842 Cataract extraction status, left eye: Secondary | ICD-10-CM

## 2019-05-08 DIAGNOSIS — Z79899 Other long term (current) drug therapy: Secondary | ICD-10-CM

## 2019-05-08 DIAGNOSIS — Z95810 Presence of automatic (implantable) cardiac defibrillator: Secondary | ICD-10-CM

## 2019-05-08 DIAGNOSIS — E119 Type 2 diabetes mellitus without complications: Secondary | ICD-10-CM

## 2019-05-08 DIAGNOSIS — Z7984 Long term (current) use of oral hypoglycemic drugs: Secondary | ICD-10-CM

## 2019-05-08 DIAGNOSIS — E118 Type 2 diabetes mellitus with unspecified complications: Secondary | ICD-10-CM | POA: Diagnosis not present

## 2019-05-08 DIAGNOSIS — Z885 Allergy status to narcotic agent status: Secondary | ICD-10-CM | POA: Diagnosis not present

## 2019-05-08 DIAGNOSIS — Z8249 Family history of ischemic heart disease and other diseases of the circulatory system: Secondary | ICD-10-CM

## 2019-05-08 DIAGNOSIS — Z7982 Long term (current) use of aspirin: Secondary | ICD-10-CM

## 2019-05-08 DIAGNOSIS — R0609 Other forms of dyspnea: Secondary | ICD-10-CM

## 2019-05-08 DIAGNOSIS — R0600 Dyspnea, unspecified: Secondary | ICD-10-CM | POA: Diagnosis present

## 2019-05-08 DIAGNOSIS — Z833 Family history of diabetes mellitus: Secondary | ICD-10-CM

## 2019-05-08 LAB — BRAIN NATRIURETIC PEPTIDE: B Natriuretic Peptide: 185 pg/mL — ABNORMAL HIGH (ref 0.0–100.0)

## 2019-05-08 LAB — CBC
HCT: 43.4 % (ref 39.0–52.0)
Hemoglobin: 14.1 g/dL (ref 13.0–17.0)
MCH: 32.3 pg (ref 26.0–34.0)
MCHC: 32.5 g/dL (ref 30.0–36.0)
MCV: 99.5 fL (ref 80.0–100.0)
Platelets: 330 10*3/uL (ref 150–400)
RBC: 4.36 MIL/uL (ref 4.22–5.81)
RDW: 14 % (ref 11.5–15.5)
WBC: 8.7 10*3/uL (ref 4.0–10.5)
nRBC: 0 % (ref 0.0–0.2)

## 2019-05-08 LAB — BASIC METABOLIC PANEL
Anion gap: 9 (ref 5–15)
BUN: 20 mg/dL (ref 8–23)
CO2: 24 mmol/L (ref 22–32)
Calcium: 9.3 mg/dL (ref 8.9–10.3)
Chloride: 103 mmol/L (ref 98–111)
Creatinine, Ser: 1.76 mg/dL — ABNORMAL HIGH (ref 0.61–1.24)
GFR calc Af Amer: 44 mL/min — ABNORMAL LOW (ref >60)
GFR calc non Af Amer: 38 mL/min — ABNORMAL LOW (ref >60)
Glucose, Bld: 134 mg/dL — ABNORMAL HIGH (ref 70–99)
Potassium: 4.2 mmol/L (ref 3.5–5.1)
Sodium: 136 mmol/L (ref 135–145)

## 2019-05-08 LAB — HEPATIC FUNCTION PANEL
ALT: 29 U/L (ref 0–44)
AST: 29 U/L (ref 15–41)
Albumin: 3.8 g/dL (ref 3.5–5.0)
Alkaline Phosphatase: 71 U/L (ref 38–126)
Bilirubin, Direct: 0.1 mg/dL (ref 0.0–0.2)
Indirect Bilirubin: 0.5 mg/dL (ref 0.3–0.9)
Total Bilirubin: 0.6 mg/dL (ref 0.3–1.2)
Total Protein: 7.3 g/dL (ref 6.5–8.1)

## 2019-05-08 LAB — TROPONIN I (HIGH SENSITIVITY)
Troponin I (High Sensitivity): 113 ng/L (ref <18)
Troponin I (High Sensitivity): 123 ng/L (ref <18)

## 2019-05-08 MED ORDER — ALBUTEROL SULFATE HFA 108 (90 BASE) MCG/ACT IN AERS
1.0000 | INHALATION_SPRAY | Freq: Four times a day (QID) | RESPIRATORY_TRACT | Status: DC | PRN
  Filled 2019-05-08: qty 6.7

## 2019-05-08 MED ORDER — CARVEDILOL 12.5 MG PO TABS
25.0000 mg | ORAL_TABLET | Freq: Two times a day (BID) | ORAL | Status: DC
  Administered 2019-05-09 – 2019-05-10 (×3): 25 mg via ORAL
  Filled 2019-05-08 (×3): qty 2

## 2019-05-08 MED ORDER — ISOSORBIDE MONONITRATE ER 60 MG PO TB24
60.0000 mg | ORAL_TABLET | Freq: Three times a day (TID) | ORAL | Status: DC
  Administered 2019-05-09 – 2019-05-10 (×4): 60 mg via ORAL
  Filled 2019-05-08 (×13): qty 1

## 2019-05-08 MED ORDER — FUROSEMIDE 80 MG PO TABS
80.0000 mg | ORAL_TABLET | Freq: Two times a day (BID) | ORAL | Status: DC
  Administered 2019-05-09 – 2019-05-10 (×3): 80 mg via ORAL
  Filled 2019-05-08 (×2): qty 1
  Filled 2019-05-08: qty 2

## 2019-05-08 MED ORDER — ALLOPURINOL 100 MG PO TABS
100.0000 mg | ORAL_TABLET | Freq: Every day | ORAL | Status: DC
  Administered 2019-05-09 – 2019-05-10 (×2): 100 mg via ORAL
  Filled 2019-05-08 (×6): qty 1

## 2019-05-08 MED ORDER — HYDROCODONE-ACETAMINOPHEN 5-325 MG PO TABS: 1.0000 | ORAL_TABLET | Freq: Four times a day (QID) | ORAL | Status: DC | PRN

## 2019-05-08 MED ORDER — SODIUM CHLORIDE 0.9% FLUSH: 3.0000 mL | Freq: Once | INTRAVENOUS | Status: DC

## 2019-05-08 MED ORDER — METHYLPREDNISOLONE SODIUM SUCC 40 MG IJ SOLR
40.0000 mg | Freq: Four times a day (QID) | INTRAMUSCULAR | Status: DC
  Administered 2019-05-09 – 2019-05-10 (×7): 40 mg via INTRAVENOUS
  Filled 2019-05-08 (×7): qty 1

## 2019-05-08 MED ORDER — SODIUM CHLORIDE 0.9% FLUSH
3.0000 mL | Freq: Two times a day (BID) | INTRAVENOUS | Status: DC
  Administered 2019-05-09 (×2): 3 mL via INTRAVENOUS

## 2019-05-08 MED ORDER — IPRATROPIUM-ALBUTEROL 20-100 MCG/ACT IN AERS
1.0000 | INHALATION_SPRAY | Freq: Four times a day (QID) | RESPIRATORY_TRACT | Status: DC
  Administered 2019-05-09 – 2019-05-10 (×5): 1 via RESPIRATORY_TRACT
  Filled 2019-05-08: qty 4

## 2019-05-08 MED ORDER — ENOXAPARIN SODIUM 40 MG/0.4ML ~~LOC~~ SOLN
40.0000 mg | SUBCUTANEOUS | Status: DC
  Administered 2019-05-09 – 2019-05-10 (×2): 40 mg via SUBCUTANEOUS
  Filled 2019-05-08 (×2): qty 0.4

## 2019-05-08 MED ORDER — ACETAMINOPHEN 650 MG RE SUPP: 650.0000 mg | Freq: Four times a day (QID) | RECTAL | Status: DC | PRN

## 2019-05-08 MED ORDER — ACETAMINOPHEN 325 MG PO TABS: 650.0000 mg | ORAL_TABLET | Freq: Four times a day (QID) | ORAL | Status: DC | PRN

## 2019-05-08 MED ORDER — ASPIRIN EC 81 MG PO TBEC
81.0000 mg | DELAYED_RELEASE_TABLET | Freq: Every day | ORAL | Status: DC
  Administered 2019-05-09 – 2019-05-10 (×2): 81 mg via ORAL
  Filled 2019-05-08 (×2): qty 1

## 2019-05-08 MED ORDER — DOXYCYCLINE HYCLATE 100 MG PO TABS
100.0000 mg | ORAL_TABLET | Freq: Two times a day (BID) | ORAL | Status: DC
  Administered 2019-05-09 – 2019-05-10 (×4): 100 mg via ORAL
  Filled 2019-05-08 (×4): qty 1

## 2019-05-08 MED ORDER — DOCUSATE SODIUM 100 MG PO CAPS
100.0000 mg | ORAL_CAPSULE | Freq: Two times a day (BID) | ORAL | Status: DC
  Administered 2019-05-09 (×3): 100 mg via ORAL
  Filled 2019-05-08 (×3): qty 1

## 2019-05-08 MED ORDER — GABAPENTIN 300 MG PO CAPS
300.0000 mg | ORAL_CAPSULE | Freq: Two times a day (BID) | ORAL | Status: DC
  Administered 2019-05-09 – 2019-05-10 (×4): 300 mg via ORAL
  Filled 2019-05-08 (×4): qty 1

## 2019-05-08 MED ORDER — SIMVASTATIN 20 MG PO TABS
40.0000 mg | ORAL_TABLET | Freq: Every day | ORAL | Status: DC
  Administered 2019-05-09 – 2019-05-10 (×3): 40 mg via ORAL
  Filled 2019-05-08: qty 2
  Filled 2019-05-08 (×2): qty 4

## 2019-05-08 MED ORDER — ASPIRIN 325 MG PO TABS
325.0000 mg | ORAL_TABLET | Freq: Once | ORAL | Status: AC
  Administered 2019-05-09: 02:00:00 325 mg via ORAL
  Filled 2019-05-08: qty 1

## 2019-05-08 MED ORDER — INSULIN ASPART 100 UNIT/ML ~~LOC~~ SOLN
0.0000 [IU] | Freq: Three times a day (TID) | SUBCUTANEOUS | Status: DC
  Administered 2019-05-09 (×2): 11 [IU] via SUBCUTANEOUS
  Administered 2019-05-10: 5 [IU] via SUBCUTANEOUS
  Administered 2019-05-10: 11 [IU] via SUBCUTANEOUS
  Filled 2019-05-08 (×2): qty 1

## 2019-05-08 NOTE — ED Provider Notes (Signed)
[facility]   [id number] 05/08/19 Arrival Time: 57   CC: SOB  SUBJECTIVE:  [name] Lugar is a 73 y.o. male significant CV hx including AICD, CAD, HTN, high cholesterol, CHF, ischemic cardiomyopathy, hx of MI, PAD, type 2 DM, and AAA, has undergone pacemaker placement, bypass, and catheterizations, who presents with complaint of worsening SOB x 3 days.  Denies a precipitating event, trauma, recent lower respiratory tract, or strenuous upper body activities.  Symptoms made worse with activity and laying flat.  Reports previous symptoms in the past related to PNA.  Complains of intermittent lightheadedness, LLE swelling.  Also mentions Intermittent (every couple of days) chest discomfort x 1 week that he localizes to substernal area that lasts a couple of minutes at a time, denies provoking factors.  Currently not experiencing chest pain.  Denies fever, chills, rhinorrhea, congestion, sore throat, cough, dizziness, palpitations, tachycardia, nausea, vomiting, abdominal pain, changes in bowel or bladder habits, diaphoresis, numbness/tingling in extremities, or anxiety.    Denies hemoptysis, recent long travel, recent surgery, malignancy, tobacco use, hormone use, or previous blood clot  ROS: As per HPI.  All other pertinent ROS negative.    Past Medical History:  Diagnosis Date  . AAA (abdominal aortic aneurysm) ([place])    Followed by Dr. Donnetta Hutching  . AICD (automatic cardioverter/defibrillator) present   . Angioedema    Secondary to ACE inhibitor  . Arthritis   . Bell palsy   . Carotid artery disease ([place])   . Coronary atherosclerosis of native coronary artery    Multivessel status post CABG  . Essential hypertension, benign   . Gout   . Hernia of abdominal wall   . Hypercholesteremia   . Ischemic cardiomyopathy    LVEF 45-50% 11/2011  . Migraines   . Morbid obesity ([place]) 02/12/2019  . Myocardial infarction ([place])   . Nephrolithiasis   . PAD (peripheral artery disease)  ([place])    Followed by Dr. Donnetta Hutching  . Sleep apnea   . Type 2 diabetes mellitus ([place])    Past Surgical History:  Procedure Laterality Date  . ABDOMINAL AORTIC ANEURYSM REPAIR  November 19, 2013   [facility] :  Dr. Sammuel Hines  . ABDOMINAL AORTIC ANEURYSM REPAIR  09-07-2014   [facility]  . BI-VENTRICULAR IMPLANTABLE CARDIOVERTER DEFIBRILLATOR  (CRT-D)  11/05/2014  . BYPASS GRAFT POPLITEAL TO POPLITEAL Left 06/08/2014   Procedure: BYPASS GRAFT FEMORAL ARTERY TO ABOVE KNEE POPLITEAL;  Surgeon: Rosetta Posner, MD;  Location: [place];  Service: Vascular;  Laterality: Left;  . CARDIAC CATHETERIZATION N/A 10/02/2014   Procedure: Left Heart Cath and Cors/Grafts Angiography;  Surgeon: Belva Crome, MD;  Location: [place] CV LAB;  Service: Cardiovascular;  Laterality: N/A;  . CARDIAC CATHETERIZATION  "several"  . CATARACT EXTRACTION W/PHACO  03/23/2011   Procedure: CATARACT EXTRACTION PHACO AND INTRAOCULAR LENS PLACEMENT (IOC);  Surgeon: Tonny Branch;  Location: [facility];  Service: Ophthalmology;  Laterality: Left;  CDE: 15.99  . CATARACT EXTRACTION W/PHACO  04/17/2011   Procedure: CATARACT EXTRACTION PHACO AND INTRAOCULAR LENS PLACEMENT (IOC);  Surgeon: Tonny Branch;  Location: [facility];  Service: Ophthalmology;  Laterality: Right;  CDE: 23.45  . CORONARY ARTERY BYPASS GRAFT  2001   LIMA to LAD, SVG to diagonal and ramus, SVG to OM, SVG to AM  . DUPUYTREN CONTRACTURE RELEASE Left 2009  . EP IMPLANTABLE DEVICE N/A 11/05/2014   Procedure: BiV ICD Insertion CRT-D;  Surgeon: Evans Lance, MD;  Location: [place]  CV LAB;  Service: Cardiovascular;  Laterality: N/A;  . EP IMPLANTABLE DEVICE N/A 02/24/2015   Procedure: Lead Revision/Repair;  Surgeon: Evans Lance, MD;  Location: [place] CV LAB;  Service: Cardiovascular;  Laterality: N/A;  . EXTRACORPOREAL SHOCK WAVE LITHOTRIPSY  X 1  . EYE SURGERY    . FOOT SURGERY Left 1963   "gangrene"  . ICD LEAD REMOVAL  02/24/2015  . LOWER EXTREMITY ANGIOGRAM Bilateral  04/28/2015   Procedure: Lower Extremity Angiogram;  Surgeon: Rosetta Posner, MD;  Location: [place] CV LAB;  Service: Cardiovascular;  Laterality: Bilateral;  . PERIPHERAL VASCULAR CATHETERIZATION N/A 04/28/2015   Procedure: Abdominal Aortogram;  Surgeon: Rosetta Posner, MD;  Location: [place] CV LAB;  Service: Cardiovascular;  Laterality: N/A;   Allergies  Allergen Reactions  . Ace Inhibitors Swelling    Throat and lips swelled; ended up in ED  . Codeine Other (See Comments)     Delirium, hallucinations  . Hydromorphone Nausea And Vomiting   No current facility-administered medications on file prior to encounter.   Current Outpatient Medications on File Prior to Encounter  Medication Sig Dispense Refill  . acetaminophen (TYLENOL) 500 MG tablet Take 1,000 mg by mouth daily as needed for mild pain, moderate pain or headache (pain).     Marland Kitchen allopurinol (ZYLOPRIM) 100 MG tablet TAKE 1 TABLET BY MOUTH ONCE DAILY. 90 tablet 0  . amLODipine (NORVASC) 5 MG tablet TAKE 1 TABLET ONCE DAILY. 90 tablet 0  . aspirin EC 81 MG tablet Take 81 mg by mouth daily.    . carvedilol (COREG) 25 MG tablet TAKE (1) TABLET TWICE DAILY. 60 tablet 6  . Cholecalciferol (VITAMIN D-3) 125 MCG (5000 UT) TABS Take 1 tablet by mouth daily.    Marland Kitchen docusate sodium (COLACE) 100 MG capsule Take 1 capsule (100 mg total) by mouth every 12 (twelve) hours. 60 capsule 0  . furosemide (LASIX) 80 MG tablet TAKE (1) TABLET TWICE DAILY. 180 tablet 3  . gabapentin (NEURONTIN) 300 MG capsule TAKE 1 CAPSULE BY MOUTH TWICE DAILY. 60 capsule 3  . glipiZIDE (GLUCOTROL) 10 MG tablet Take 1 tablet (10 mg total) by mouth 2 (two) times daily. 180 tablet 1  . glipiZIDE (GLUCOTROL) 10 MG tablet TAKE (1) TABLET TWICE DAILY. 180 tablet 0  . HYDROcodone-acetaminophen (NORCO/VICODIN) 5-325 MG tablet Take 1 tablet by mouth every 6 (six) hours as needed. 10 tablet 0  . isosorbide mononitrate (IMDUR) 60 MG 24 hr tablet TAKE 1 TABLET BY MOUTH 3 TIMES  DAILY. 90 tablet 3  . nitroGLYCERIN (NITROSTAT) 0.4 MG SL tablet PLACE 0.4 MG  UNDER THE TONGUE EVERY 5 MINUTES UP TO 3 DOSES AS NEEDED FOR CHEST PAIN. 25 tablet 3  . potassium chloride (K-DUR) 10 MEQ tablet TAKE 2 TABLETS BY MOUTH ONCE DAILY. 180 tablet 3  . simvastatin (ZOCOR) 40 MG tablet TAKE 1 TABLET ONCE DAILY. 30 tablet 0  . tamsulosin (FLOMAX) 0.4 MG CAPS capsule Take 1 capsule (0.4 mg total) by mouth daily after supper. 30 capsule 0  . TRULICITY [id number] 0000000 SOPN ONE INJECTION INTO SKIN EVERY WEEK. 2 mL 0   Social History   Socioeconomic History  . Marital status: Divorced    Spouse name: Not on file  . Number of children: Not on file  . Years of education: Not on file  . Highest education level: Not on file  Occupational History  . Not on file  Tobacco Use  . Smoking status: Former Smoker  Packs/day: 1.00    Years: 25.00    Pack years: 25.00    Types: Cigarettes    Start date: 02/28/1979    Quit date: 05/23/2007    Years since quitting: 11.9  . Smokeless tobacco: Never Used  Substance and Sexual Activity  . Alcohol use: No    Alcohol/week: 0.0 standard drinks  . Drug use: No  . Sexual activity: Never  Other Topics Concern  . 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   Social Determinants of Health   Financial Resource Strain:   . Difficulty of Paying Living Expenses: Not on file  Food Insecurity:   . Worried About Charity fundraiser in the Last Year: Not on file  . Ran Out of Food in the Last Year: Not on file  Transportation Needs:   . Lack of Transportation (Medical): Not on file  . Lack of Transportation (Non-Medical): Not on file  Physical Activity:   . Days of Exercise per Week: Not on file  . Minutes of Exercise per Session: Not on file  Stress:   . Feeling of Stress : Not on file  Social Connections:   . Frequency of Communication with Friends and Family: Not on file  . Frequency of Social  Gatherings with Friends and Family: Not on file  . Attends Religious Services: Not on file  . Active Member of Clubs or Organizations: Not on file  . Attends Archivist Meetings: Not on file  . Marital Status: Not on file  Intimate Partner Violence:   . Fear of Current or Ex-Partner: Not on file  . Emotionally Abused: Not on file  . Physically Abused: Not on file  . Sexually Abused: Not on file   Family History  Problem Relation Age of Onset  . Diabetes Mother        Type  I   . Varicose Veins Mother   . Heart disease Father 62       AAA  . AAA (abdominal aortic aneurysm) Father   . Hyperlipidemia Father   . Hypertension Father      OBJECTIVE:  Vitals:   05/08/19 1419  BP: (!) 152/71  Pulse: 67  Resp: 16  Temp: 98 F (36.7 C)  TempSrc: Oral  SpO2: 95%    General appearance: alert; no distress Eyes: PERRLA; EOMI; conjunctiva normal HENT: normocephalic; atraumatic Neck: supple Lungs: subtle wheezes heard over RT lung field Heart: regular rate and rhythm.  Clear S1 and S2 without rubs, gallops, or murmur. Abdomen: soft, protuberant, non-tender; no guarding Extremities: no cyanosis or edema; symmetrical with no gross deformities Skin: warm and dry Psychological: alert and cooperative; normal mood and affect  ECG: Orders placed or performed during the hospital encounter of 05/08/19  . ED EKG  . ED EKG   EKG significant for possible pace maker failure.  Reviewed EKG with Dr. Lanny Cramp.     ASSESSMENT & PLAN:  1. Shortness of breath   2. Swelling of left lower extremity   3. Peripheral edema   4. Chest discomfort   5. Nonspecific abnormal electrocardiogram (ECG) (EKG)    Declines COVID test.    Recommending further evaluation and management in the ED.  Patient aware and in agreement.  Declines chest discomfort at this time.  I strongly encouraged patient to go by EMS, but patient declines.  Aware of risk associated with decision including organ  damage, failure, and/or death.  Patient aware and in agreement.  Will go by private vehicle to [facility] ED.      Lestine Box, PA-C 05/08/19 1505

## 2019-05-08 NOTE — ED Notes (Signed)
Hospitalist at bedside 

## 2019-05-08 NOTE — ED Provider Notes (Signed)
[facility] EMERGENCY DEPARTMENT Provider Note   CSN: [id number] Arrival date & time: 05/08/19  1509     History Chief Complaint  Patient presents with  . Chest Pain    [name] is a 73 y.o. male.  Patient complains of dyspnea on exertion.  Patient has a history of coronary artery disease.  He has bypass surgery and stent.  He has been short winded for the last 3 days  The history is provided by the patient. No language interpreter was used.  Chest Pain Pain location:  L chest Pain quality: aching   Pain radiates to:  Does not radiate Pain severity:  Mild Onset quality:  Sudden Timing:  Intermittent Progression:  Resolved Chronicity:  New Context: not breathing   Associated symptoms: no abdominal pain, no back pain, no cough, no fatigue and no headache        Past Medical History:  Diagnosis Date  . AAA (abdominal aortic aneurysm) ([place])    Followed by Dr. Donnetta Hutching  . AICD (automatic cardioverter/defibrillator) present   . Angioedema    Secondary to ACE inhibitor  . Arthritis   . Bell palsy   . Carotid artery disease ([place])   . Coronary atherosclerosis of native coronary artery    Multivessel status post CABG  . Essential hypertension, benign   . Gout   . Hernia of abdominal wall   . Hypercholesteremia   . Ischemic cardiomyopathy    LVEF 45-50% 11/2011  . Migraines   . Morbid obesity ([place]) 02/12/2019  . Myocardial infarction ([place])   . Nephrolithiasis   . PAD (peripheral artery disease) ([place])    Followed by Dr. Donnetta Hutching  . Sleep apnea   . Type 2 diabetes mellitus [facility])     Patient Active Problem List   Diagnosis Date Noted  . Obesity (BMI 30.0-34.9) 02/12/2019  . COPD with acute exacerbation ([place]) 07/05/2017  . Lobar pneumonia ([place]) 07/05/2017  . Chronic diastolic CHF (congestive heart failure) ([place]) 06/27/2017  . Bronchitis 06/27/2017  . Acute respiratory failure with hypoxemia ([place]) 06/27/2017  . ICD (implantable cardioverter-defibrillator)  malfunction 02/19/2015  . Claudication ([place]) 02/04/2015  . S/P ICD (internal cardiac defibrillator) procedure 11/05/2014  . Cardiomyopathy, ischemic 10/20/2014  . LBBB (left bundle branch block) 10/01/2014  . PVD - h/o AAA stent graft, popliteal aneurysm repair 10/01/2014  . Unstable angina ([place]) 09/30/2014  . Acute kidney injury ([place]) 09/30/2014  . Chronic renal impairment, stage 3 (moderate) ([place]) 09/30/2014  . Chronic systolic congestive heart failure ([place]) 09/30/2014  . Peripheral neuropathy 09/30/2014  . Essential hypertension 09/30/2014  . Gout 09/30/2014  . HLD (hyperlipidemia) 09/30/2014  . Frequent PVCs   . Diabetes type 2, controlled ([place]) 06/22/2014  . Aneurysm of left femoral artery ([place]) 06/08/2014  . Popliteal artery aneurysm ([place]) 09/23/2013  . Preoperative cardiovascular examination 08/29/2013  . Abdominal aneurysm without mention of rupture 03/05/2012  . ICM-EF 35% by echo [facility] April 2016   . Carotid artery disease ([place])   . Hx of CABG 2001, abnormal but low risk Myoview April 2015 11/26/2009  . ATHEROSLERO NATV ART EXTREM W/INTERMIT CLAUDICAT 09/23/2009  . Aneurysm of artery of lower extremity ([place]) 05/25/2009  . DIABETIC PERIPHERAL NEUROPATHY 03/30/2009  . Chronic systolic heart failure ([place]) 02/22/2009    Past Surgical History:  Procedure Laterality Date  . ABDOMINAL AORTIC ANEURYSM REPAIR  November 19, 2013   [facility] :  Dr. Sammuel Hines  . ABDOMINAL AORTIC ANEURYSM REPAIR  09-07-2014  [facility]  . BI-VENTRICULAR IMPLANTABLE CARDIOVERTER DEFIBRILLATOR  (CRT-D)  11/05/2014  . BYPASS GRAFT POPLITEAL TO POPLITEAL Left 06/08/2014   Procedure: BYPASS GRAFT FEMORAL ARTERY TO ABOVE KNEE POPLITEAL;  Surgeon: Rosetta Posner, MD;  Location: [place];  Service: Vascular;  Laterality: Left;  . CARDIAC CATHETERIZATION N/A 10/02/2014   Procedure: Left Heart Cath and Cors/Grafts Angiography;  Surgeon: Belva Crome, MD;  Location: [place] CV LAB;  Service: Cardiovascular;   Laterality: N/A;  . CARDIAC CATHETERIZATION  "several"  . CATARACT EXTRACTION W/PHACO  03/23/2011   Procedure: CATARACT EXTRACTION PHACO AND INTRAOCULAR LENS PLACEMENT (IOC);  Surgeon: Tonny Branch;  Location: [facility];  Service: Ophthalmology;  Laterality: Left;  CDE: 15.99  . CATARACT EXTRACTION W/PHACO  04/17/2011   Procedure: CATARACT EXTRACTION PHACO AND INTRAOCULAR LENS PLACEMENT (IOC);  Surgeon: Tonny Branch;  Location: [facility];  Service: Ophthalmology;  Laterality: Right;  CDE: 23.45  . CORONARY ARTERY BYPASS GRAFT  2001   LIMA to LAD, SVG to diagonal and ramus, SVG to OM, SVG to AM  . DUPUYTREN CONTRACTURE RELEASE Left 2009  . EP IMPLANTABLE DEVICE N/A 11/05/2014   Procedure: BiV ICD Insertion CRT-D;  Surgeon: Evans Lance, MD;  Location: [place] [facility];  Service: Cardiovascular;  Laterality: N/A;  . EP IMPLANTABLE DEVICE N/A 02/24/2015   Procedure: Lead Revision/Repair;  Surgeon: Evans Lance, MD;  Location: [place] [facility];  Service: Cardiovascular;  Laterality: N/A;  . EXTRACORPOREAL SHOCK WAVE LITHOTRIPSY  X 1  . EYE SURGERY    . FOOT SURGERY Left 1963   "gangrene"  . ICD LEAD REMOVAL  02/24/2015  . LOWER EXTREMITY ANGIOGRAM Bilateral 04/28/2015   Procedure: Lower Extremity Angiogram;  Surgeon: Rosetta Posner, MD;  Location: [place] CV LAB;  Service: Cardiovascular;  Laterality: Bilateral;  . PERIPHERAL VASCULAR CATHETERIZATION N/A 04/28/2015   Procedure: Abdominal Aortogram;  Surgeon: Rosetta Posner, MD;  Location: [place] [facility];  Service: Cardiovascular;  Laterality: N/A;       Family History  Problem Relation Age of Onset  . Diabetes Mother        Type  I   . Varicose Veins Mother   . Heart disease Father 97       AAA  . AAA (abdominal aortic aneurysm) Father   . Hyperlipidemia Father   . Hypertension Father     Social History   Tobacco Use  . Smoking status: Former Smoker    Packs/day: 1.00    Years: 25.00    Pack years: 25.00    Types: Cigarettes     Start date: 02/28/1979    Quit date: 05/23/2007    Years since quitting: 11.9  . Smokeless tobacco: Never Used  Substance Use Topics  . Alcohol use: No    Alcohol/week: 0.0 standard drinks  . Drug use: No    Home Medications Prior to Admission medications   Medication Sig Start Date End Date Taking? Authorizing Provider  acetaminophen (TYLENOL) 500 MG tablet Take 1,000 mg by mouth daily as needed for mild pain, moderate pain or headache (pain).     [provider]  allopurinol (ZYLOPRIM) 100 MG tablet TAKE 1 TABLET BY MOUTH ONCE DAILY. 04/23/19   Gosrani, Nimish C, MD  amLODipine (NORVASC) 5 MG tablet TAKE 1 TABLET ONCE DAILY. 04/23/19   Satira Sark, MD  aspirin EC 81 MG tablet Take 81 mg by mouth daily.    [provider]  carvedilol (  COREG) 25 MG tablet TAKE (1) TABLET TWICE DAILY. 05/05/19   Satira Sark, MD  Cholecalciferol (VITAMIN D-3) 125 MCG (5000 UT) TABS Take 1 tablet by mouth daily.    [provider]  docusate sodium (COLACE) 100 MG capsule Take 1 capsule (100 mg total) by mouth every 12 (twelve) hours. 06/01/18   Ripley Fraise, MD  furosemide (LASIX) 80 MG tablet TAKE (1) TABLET TWICE DAILY. 10/21/18   Satira Sark, MD  gabapentin (NEURONTIN) 300 MG capsule TAKE 1 CAPSULE BY MOUTH TWICE DAILY. 03/25/19   Hurshel Party C, MD  glipiZIDE (GLUCOTROL) 10 MG tablet Take 1 tablet (10 mg total) by mouth 2 (two) times daily. 01/23/19   Gosrani, Nimish C, MD  glipiZIDE (GLUCOTROL) 10 MG tablet TAKE (1) TABLET TWICE DAILY. 04/23/19   Doree Albee, MD  HYDROcodone-acetaminophen (NORCO/VICODIN) 5-325 MG tablet Take 1 tablet by mouth every 6 (six) hours as needed. 06/01/18   Ripley Fraise, MD  isosorbide mononitrate (IMDUR) 60 MG 24 hr tablet TAKE 1 TABLET BY MOUTH 3 TIMES DAILY. 04/23/19   Gosrani, Nimish C, MD  nitroGLYCERIN (NITROSTAT) 0.4 MG SL tablet PLACE 0.4 MG  UNDER THE TONGUE EVERY 5 MINUTES UP TO 3 DOSES AS NEEDED FOR CHEST PAIN.  04/24/18   Satira Sark, MD  potassium chloride (K-DUR) 10 MEQ tablet TAKE 2 TABLETS BY MOUTH ONCE DAILY. 10/21/18   Satira Sark, MD  simvastatin (ZOCOR) 40 MG tablet TAKE 1 TABLET ONCE DAILY. 04/01/19   Satira Sark, MD  tamsulosin (FLOMAX) 0.4 MG CAPS capsule Take 1 capsule (0.4 mg total) by mouth daily after supper. 07/04/17   Orson Eva, MD  TRULICITY A999333 0000000 SOPN ONE INJECTION INTO SKIN EVERY WEEK. 04/23/19   Doree Albee, MD    Allergies    Ace inhibitors, Codeine, and Hydromorphone  Review of Systems   Review of Systems  Constitutional: Negative for appetite change and fatigue.  HENT: Negative for congestion, ear discharge and sinus pressure.   Eyes: Negative for discharge.  Respiratory: Negative for cough.        Dyspnea  Cardiovascular: Positive for chest pain.  Gastrointestinal: Negative for abdominal pain and diarrhea.  Genitourinary: Negative for frequency and hematuria.  Musculoskeletal: Negative for back pain.  Skin: Negative for rash.  Neurological: Negative for seizures and headaches.  Psychiatric/Behavioral: Negative for hallucinations.    Physical Exam Updated Vital Signs BP (!) 146/65   Pulse 74   Temp 98.2 F (36.8 C) (Oral)   Resp 16   SpO2 95%   Physical Exam Vitals reviewed.  Constitutional:      Appearance: He is well-developed.  HENT:     Head: Normocephalic.     Nose: Nose normal.  Eyes:     General: No scleral icterus.    Conjunctiva/sclera: Conjunctivae normal.  Neck:     Thyroid: No thyromegaly.  Cardiovascular:     Rate and Rhythm: Normal rate and regular rhythm.     Heart sounds: No murmur. No friction rub. No gallop.   Pulmonary:     Breath sounds: No stridor. No wheezing or rales.  Chest:     Chest wall: No tenderness.  Abdominal:     General: There is no distension.     Tenderness: There is no abdominal tenderness. There is no rebound.  Musculoskeletal:        General: Normal range of motion.      Cervical back: Neck supple.  Lymphadenopathy:  Cervical: No cervical adenopathy.  Skin:    Findings: No erythema or rash.  Neurological:     Mental Status: He is oriented to person, place, and time.     Motor: No abnormal muscle tone.     Coordination: Coordination normal.  Psychiatric:        Behavior: Behavior normal.     ED Results / Procedures / Treatments   Labs (all labs ordered are listed, but only abnormal results are displayed) Labs Reviewed  BASIC METABOLIC PANEL - Abnormal; Notable for the following components:      Result Value   Glucose, Bld 134 (*)    Creatinine, Ser 1.76 (*)    GFR calc non Af Amer 38 (*)    GFR calc Af Amer 44 (*)    All other components within normal limits  BRAIN NATRIURETIC PEPTIDE - Abnormal; Notable for the following components:   B Natriuretic Peptide 185.0 (*)    All other components within normal limits  TROPONIN I (HIGH SENSITIVITY) - Abnormal; Notable for the following components:   Troponin I (High Sensitivity) 113 (*)    All other components within normal limits  TROPONIN I (HIGH SENSITIVITY) - Abnormal; Notable for the following components:   Troponin I (High Sensitivity) 123 (*)    All other components within normal limits  SARS CORONAVIRUS 2 (TAT 6-24 HRS)  CBC  HEPATIC FUNCTION PANEL    EKG EKG Interpretation  Date/Time:  Thursday May 08 2019 18:02:40 EST Ventricular Rate:  71 PR Interval:    QRS Duration: 134 QT Interval:  449 QTC Calculation: 488 R Axis:   86 Text Interpretation: Sinus rhythm Multiform ventricular premature complexes Left bundle branch block Confirmed by Milton Ferguson [phone]) on 05/08/2019 8:29:24 PM   Radiology DG Chest 2 View  Result Date: 05/08/2019 CLINICAL DATA:  States he was advised to come here because his pacemaker is not working. Worsening SOB x 3 days. EXAM: CHEST - 2 VIEW COMPARISON:  07/12/2017 FINDINGS: Stable changes from prior CABG surgery. Cardiac silhouette is  top-normal in size. Stable left anterior chest wall biventricular cardioverter-defibrillator. No mediastinal or hilar masses. No evidence of adenopathy. Lungs are clear.  No pleural effusion or pneumothorax. Skeletal structures are grossly intact. IMPRESSION: No acute cardiopulmonary disease. Electronically Signed   By: Lajean Manes M.D.   On: 05/08/2019 16:33    Procedures Procedures (including critical care time)  Medications Ordered in ED Medications  sodium chloride flush (NS) 0.9 % injection 3 mL (has no administration in time range)    ED Course  I have reviewed the triage vital signs and the nursing notes.  Pertinent labs & imaging results that were available during my care of the patient were reviewed by me and considered in my medical decision making (see chart for details).    CRITICAL CARE Performed by: Milton Ferguson Total critical care time: 35 minutes Critical care time was exclusive of separately billable procedures and treating other patients. Critical care was necessary to treat or prevent imminent or life-threatening deterioration. Critical care was time spent personally by me on the following activities: development of treatment plan with patient and/or surrogate as well as nursing, discussions with consultants, evaluation of patient's response to treatment, examination of patient, obtaining history from patient or surrogate, ordering and performing treatments and interventions, ordering and review of laboratory studies, ordering and review of radiographic studies, pulse oximetry and re-evaluation of patient's condition. Patient with dyspnea on exertion and elevated troponin.  He will  be admitted to medicine with cardiology consult tomorrow MDM Rules/Calculators/A&P                       Final Clinical Impression(s) / ED Diagnoses Final diagnoses:  Dyspnea on exertion    Rx / DC Orders ED Discharge Orders    None       Milton Ferguson, MD 05/08/19 2125

## 2019-05-08 NOTE — Progress Notes (Signed)
Brief note regarding plan, with full H&P to follow:  73 year old male with history of coronary artery disease and suspected underlying COPD in the setting of significant prior smoking history, who is admitted with suspected acute COPD exacerbation presenting with 3 days of shortness of breath associated with productive cough.  Similar presentation in February 2019 at which time the patient was diagnosed with multifocal pneumonia on CT chest with consequential suspected acute COPD exacerbation.  At this time, CT chest has been ordered to further evaluate for underlying pneumonia.  Additionally, treating with Solu-Medrol, scheduled Combivent, as needed albuterol.  High-sensitivity troponin mildly elevated at presentation in the absence of any associated chest pain or dyspnea on exertion, with EKG showing no evidence of acute ischemic changes.  Felt to be less likely to be associated with ACS.     Babs Bertin, DO Hospitalist

## 2019-05-08 NOTE — Telephone Encounter (Signed)
Fwd to Dr. Domenic Polite & Dr. Lovena Le.

## 2019-05-08 NOTE — Discharge Instructions (Addendum)
Recommending further evaluation and management in the ED.  Patient aware and in agreement.  Declines chest discomfort at this time.  I strongly encouraged patient to go by EMS, but patient declines.  Aware of risk associated with decision including organ damage, failure, and/or death.  Patient aware and in agreement.  Will go by private vehicle to [facility] ED.

## 2019-05-08 NOTE — ED Triage Notes (Signed)
States he was advised to come here because his pacemaker is not working

## 2019-05-08 NOTE — H&P (Addendum)
History and Physical    PLEASE NOTE THAT DRAGON DICTATION SOFTWARE WAS USED IN THE CONSTRUCTION OF THIS NOTE.   [name] [id number] DOB: 1946/05/11 DOA: 05/08/2019  PCP: Doree Albee, MD Patient coming from: Home  I have personally briefly reviewed patient's old medical records in [place]  Chief Complaint: Shortness of breath  HPI: [name] is a 73 y.o. male with medical history significant for coronary artery disease status post CABG in 2001, chronic combined systolic/diastolic heart failure with most recent echocardiogram in September 2019 showing LVEF 45 to 50%, type 2 diabetes mellitus, hypertension, stage III chronic kidney disease with baseline creatinine of 1.6-1.8, who is admitted to [facility] on 05/08/2019 with suspected acute COPD exacerbation after presenting from home to [facility] emergency department complaining of shortness of breath.   The patient reports 3 days of continuous, progressive shortness of breath associated with new onset productive cough.  Denies any worsening of his shortness of breath with exertion, and also denies any associated orthopnea, PND, or worsening of peripheral edema.  He is also not aware of any recent weight gain.  Denies any associated chest pain, palpitations, diaphoresis, nausea, vomiting.   The patient reports that the above presentation involving continued shortness of breath and new onset productive cough feels very similar to the symptoms that he was experiencing at the time of hospitalization in February 2019 for acute hypoxic respiratory failure due to multi focal pneumonia identified on CT of the chest, but not initially seen on chest x-ray, with associated suspected acute COPD exacerbation in the setting of a long prior smoking history.  Per discharge summary from this February 2019 hospitalization, the patient was recommended to pursue outpatient PFTs, but it does not appear that he has done  so.  He confirms that he is no longer smoking.  Denies any subjective fever, chills, rigors, or generalized myalgias. Denies any recent headache, neck stiffness, rhinitis, rhinorrhea, sore throat, abdominal pain, diarrhea, or rash. No recent traveling or known COVID-19 exposures.  Denies any recent trauma.   Past medical history is notable for history of coronary artery disease status post CABG in 2001.  The patient has undergone multiple subsequent left heart caths, with most recent occurring in May 2016.  The following were the results of his most recent coronary angiography: Bypass graft failure with total occlusion of the saphenous vein graft to the acute marginal of the right coronary, total occlusion of the sequential saphenous vein graft to the diagonal and ramus intermedius, and total occlusion of the saphenous graft to the distal circumflex/obtuse marginal. Patent LIMA to the LAD. Total occlusion of the native LAD, the mid right coronary, second obtuse marginal, the first diagonal, and the ramus intermedius. The distal right coronary, the obtuse marginal, ramus intermedius are collateralized from the native circulation via the LIMA to LAD. Severe left ventricular systolic dysfunction with an inferobasal aneurysm, and an ejection fraction of 20-25%.  In the setting of after mentioned LVEF 20-25%, the patient underwent AICD placement in 2016 for primary prevention of sudden cardiac death.   Past medical history is also notable for history of chronic combined heart failure, with most recent echocardiogram performed on 02/13/2018 showing mild LVH, LVEF 45 to 50%, akinesis of the basal inferolateral myocardium, grade 1 diastolic dysfunction, and no evidence of significant valvular pathology.  He reports good compliance with his outpatient cardiac medications.   ED Course: Vital signs in the ED were notable for the following:  Temperature max 98.2; heart rate 63-74; blood pressure 129/61-150 1/77;  respiratory rate 14-16, and oxygen saturation 97 to 99% on room air.  Labs in the ED were notable for the following: CMP notable for sodium 136, bicarbonate 24, creatinine 1.76 relative to most recent prior creatinine data point of 1.86 on 02/12/2019, glucose 134, liver enzymes found to be within normal limits.  BNP 185 compared to most recent prior value of 429 on 06/29/2017.  High-sensitivity troponin I initially 113, with repeat value of 123, with no prior high-sensitivity troponin I values for point of comparison.  CBC notable for white blood cell count of 8700, hemoglobin 14.  In the setting of presenting shortness of breath, nasopharyngeal COVID-19 PCR was obtained, with result currently pending.  Chest x-ray, per final radiology report, showed no evidence of acute cardiopulmonary process, including no evidence of infiltrate, edema, effusion, pneumothorax.  EKG showed sinus rhythm with multiple PVCs, ventricular rate 71, and no evidence of acute T wave or ST changes.  The [facility] was subsequently [facility] to [facility] for further evaluation and management of suspected presenting acute COPD exacerbation.    Review of Systems: As per HPI otherwise 10 point review of systems negative.   Past Medical History:  Diagnosis Date   AAA (abdominal aortic aneurysm) ([place])    Followed by Dr. Donnetta Hutching   AICD (automatic cardioverter/defibrillator) present    Angioedema    Secondary to ACE inhibitor   Arthritis    Bell palsy    Carotid artery disease (HCC)    Coronary atherosclerosis of native coronary artery    Multivessel status post CABG   Essential hypertension, benign    Gout    Hernia of abdominal wall    Hypercholesteremia    Ischemic cardiomyopathy    LVEF 45-50% 11/2011   Migraines    Morbid obesity ([place]) 02/12/2019   Myocardial infarction ([place])    Nephrolithiasis    PAD (peripheral artery disease) ([place])    Followed by Dr. Donnetta Hutching   Sleep apnea    Type 2 diabetes  mellitus [facility])     Past Surgical History:  Procedure Laterality Date   ABDOMINAL AORTIC ANEURYSM REPAIR  November 19, 2013   [facility] :  Dr. Sammuel Hines   ABDOMINAL AORTIC ANEURYSM REPAIR  09-07-2014   [facility] [place]   BI-VENTRICULAR IMPLANTABLE CARDIOVERTER DEFIBRILLATOR  (CRT-D)  11/05/2014   BYPASS GRAFT POPLITEAL TO POPLITEAL Left 06/08/2014   Procedure: BYPASS GRAFT FEMORAL ARTERY TO ABOVE KNEE POPLITEAL;  Surgeon: Rosetta Posner, MD;  Location: [facility] OR;  Service: Vascular;  Laterality: Left;   CARDIAC CATHETERIZATION N/A 10/02/2014   Procedure: Left Heart Cath and Cors/Grafts Angiography;  Surgeon: Belva Crome, MD;  Location: [place] CV LAB;  Service: Cardiovascular;  Laterality: N/A;   CARDIAC CATHETERIZATION  "several"   CATARACT EXTRACTION W/PHACO  03/23/2011   Procedure: CATARACT EXTRACTION PHACO AND INTRAOCULAR LENS PLACEMENT (IOC);  Surgeon: Tonny Branch;  Location: [facility] ORS;  Service: Ophthalmology;  Laterality: Left;  CDE: 15.99   CATARACT EXTRACTION W/PHACO  04/17/2011   Procedure: CATARACT EXTRACTION PHACO AND INTRAOCULAR LENS PLACEMENT (IOC);  Surgeon: Tonny Branch;  Location: [facility] ORS;  Service: Ophthalmology;  Laterality: Right;  CDE: 23.45   CORONARY ARTERY BYPASS GRAFT  2001   LIMA to LAD, SVG to diagonal and ramus, SVG to OM, SVG to AM   DUPUYTREN CONTRACTURE RELEASE Left 2009   EP IMPLANTABLE DEVICE N/A 11/05/2014   Procedure: BiV ICD Insertion CRT-D;  Surgeon: Evans Lance, MD;  Location: [place] CV LAB;  Service: Cardiovascular;  Laterality: N/A;   EP IMPLANTABLE DEVICE N/A 02/24/2015   Procedure: Lead Revision/Repair;  Surgeon: Evans Lance, MD;  Location: [place] CV LAB;  Service: Cardiovascular;  Laterality: N/A;   EXTRACORPOREAL SHOCK WAVE LITHOTRIPSY  X 1   EYE SURGERY     FOOT SURGERY Left 1963   "gangrene"   ICD LEAD REMOVAL  02/24/2015   LOWER EXTREMITY ANGIOGRAM Bilateral 04/28/2015   Procedure: Lower Extremity Angiogram;  Surgeon: Rosetta Posner, MD;  Location: [place] CV LAB;  Service: Cardiovascular;  Laterality: Bilateral;   PERIPHERAL VASCULAR CATHETERIZATION N/A 04/28/2015   Procedure: Abdominal Aortogram;  Surgeon: Rosetta Posner, MD;  Location: [place] CV LAB;  Service: Cardiovascular;  Laterality: N/A;    Social History:  reports that he quit smoking about 11 years ago. His smoking use included cigarettes. He started smoking about 40 years ago. He has a 25.00 pack-year smoking history. He has never used smokeless tobacco. He reports that he does not drink alcohol or use drugs.   Allergies  Allergen Reactions   Ace Inhibitors Swelling    Throat and lips swelled; ended up in ED   Codeine Other (See Comments)     Delirium, hallucinations   Hydromorphone Nausea And Vomiting    Family History  Problem Relation Age of Onset   Diabetes Mother        Type  I    Varicose Veins Mother    Heart disease Father 33       AAA   AAA (abdominal aortic aneurysm) Father    Hyperlipidemia Father    Hypertension Father     Prior to Admission medications   Medication Sig Start Date End Date Taking? Authorizing Provider  acetaminophen (TYLENOL) 500 MG tablet Take 1,000 mg by mouth daily as needed for mild pain, moderate pain or headache (pain).     [provider]  allopurinol (ZYLOPRIM) 100 MG tablet TAKE 1 TABLET BY MOUTH ONCE DAILY. 04/23/19   Gosrani, Nimish C, MD  amLODipine (NORVASC) 5 MG tablet TAKE 1 TABLET ONCE DAILY. 04/23/19   Satira Sark, MD  aspirin EC 81 MG tablet Take 81 mg by mouth daily.    [provider]  carvedilol (COREG) 25 MG tablet TAKE (1) TABLET TWICE DAILY. 05/05/19   Satira Sark, MD  Cholecalciferol (VITAMIN D-3) 125 MCG (5000 UT) TABS Take 1 tablet by mouth daily.    [provider]  docusate sodium (COLACE) 100 MG capsule Take 1 capsule (100 mg total) by mouth every 12 (twelve) hours. 06/01/18   Ripley Fraise, MD  furosemide (LASIX) 80 MG  tablet TAKE (1) TABLET TWICE DAILY. 10/21/18   Satira Sark, MD  gabapentin (NEURONTIN) 300 MG capsule TAKE 1 CAPSULE BY MOUTH TWICE DAILY. 03/25/19   Hurshel Party C, MD  glipiZIDE (GLUCOTROL) 10 MG tablet Take 1 tablet (10 mg total) by mouth 2 (two) times daily. 01/23/19   Gosrani, Nimish C, MD  glipiZIDE (GLUCOTROL) 10 MG tablet TAKE (1) TABLET TWICE DAILY. 04/23/19   Doree Albee, MD  HYDROcodone-acetaminophen (NORCO/VICODIN) 5-325 MG tablet Take 1 tablet by mouth every 6 (six) hours as needed. 06/01/18   Ripley Fraise, MD  isosorbide mononitrate (IMDUR) 60 MG 24 hr tablet TAKE 1 TABLET BY MOUTH 3 TIMES DAILY. 04/23/19   Doree Albee, MD  nitroGLYCERIN (NITROSTAT) 0.4 MG SL tablet PLACE 0.4 MG  UNDER THE TONGUE EVERY 5 MINUTES UP TO 3 DOSES AS NEEDED FOR CHEST PAIN. 04/24/18   Satira Sark, MD  potassium chloride (K-DUR) 10 MEQ tablet TAKE 2 TABLETS BY MOUTH ONCE DAILY. 10/21/18   Satira Sark, MD  simvastatin (ZOCOR) 40 MG tablet TAKE 1 TABLET ONCE DAILY. 04/01/19   Satira Sark, MD  tamsulosin (FLOMAX) 0.4 MG CAPS capsule Take 1 capsule (0.4 mg total) by mouth daily after supper. 07/04/17   Orson Eva, MD  TRULICITY [id number] SOPN ONE INJECTION INTO SKIN EVERY WEEK. 04/23/19   Doree Albee, MD     Objective    Physical Exam: Vitals:   05/08/19 1830 05/08/19 1900 05/08/19 1930 05/08/19 2000  BP: (!) 151/73 (!) 162/87 (!) 155/76 (!) 146/65  Pulse: 63 65 62 74  Resp: 14 12 14 16   Temp:      TempSrc:      SpO2: 97% 97% 96% 95%    General: appears to be stated age; alert, oriented Skin: warm, dry, no rash Head:  AT/[place] Eyes:  PEARL b/l, EOMI Mouth:  Oral mucosa membranes appear moist, normal dentition Neck: supple; trachea midline Heart:  RRR; did not appreciate any M/R/G Lungs: CTAB, did not appreciate any wheezes, rales, or rhonchi Abdomen: + BS; soft, ND, NT Vascular: 2+ pedal pulses b/l; 2+ radial pulses b/l Extremities: 1+ edema in LLE; no  muscle wasting  Labs on Admission: I have personally reviewed following labs and imaging studies  CBC: Recent Labs  Lab 05/08/19 1601  WBC 8.7  HGB 14.1  HCT 43.4  MCV 99.5  PLT [id number]   Basic Metabolic Panel: Recent Labs  Lab 05/08/19 1601  NA 136  K 4.2  CL 103  CO2 24  GLUCOSE 134*  BUN 20  CREATININE 1.76*  CALCIUM 9.3   GFR: CrCl cannot be calculated (Unknown ideal weight.). Liver Function Tests: Recent Labs  Lab 05/08/19 1601  AST 29  ALT 29  ALKPHOS 71  BILITOT 0.6  PROT 7.3  ALBUMIN 3.8   No results for input(s): LIPASE, AMYLASE in the last 168 hours. No results for input(s): AMMONIA in the last 168 hours. Coagulation Profile: No results for input(s): INR, PROTIME in the last 168 hours. Cardiac Enzymes: No results for input(s): CKTOTAL, CKMB, CKMBINDEX, TROPONINI in the last 168 hours. BNP (last 3 results) No results for input(s): PROBNP in the last 8760 hours. HbA1C: No results for input(s): HGBA1C in the last 72 hours. CBG: No results for input(s): GLUCAP in the last 168 hours. Lipid Profile: No results for input(s): CHOL, HDL, LDLCALC, TRIG, CHOLHDL, LDLDIRECT in the last 72 hours. Thyroid Function Tests: No results for input(s): TSH, T4TOTAL, FREET4, T3FREE, THYROIDAB in the last 72 hours. Anemia Panel: No results for input(s): VITAMINB12, FOLATE, FERRITIN, TIBC, IRON, RETICCTPCT in the last 72 hours. Urine analysis:    Component Value Date/Time   COLORURINE STRAW (A) 06/01/2018 0516   APPEARANCEUR CLEAR 06/01/2018 0516   LABSPEC 1.011 06/01/2018 0516   PHURINE 5.0 06/01/2018 0516   GLUCOSEU NEGATIVE 06/01/2018 0516   HGBUR NEGATIVE 06/01/2018 0516   BILIRUBINUR NEGATIVE 06/01/2018 0516   KETONESUR NEGATIVE 06/01/2018 0516   PROTEINUR NEGATIVE 06/01/2018 0516   UROBILINOGEN 0.2 06/03/2014 0953   NITRITE NEGATIVE 06/01/2018 0516   LEUKOCYTESUR NEGATIVE 06/01/2018 0516    Radiological Exams on Admission: DG Chest 2 View  Result  Date: 05/08/2019 CLINICAL DATA:  States he was advised to come here because his pacemaker is  not working. Worsening SOB x 3 days. EXAM: CHEST - 2 VIEW COMPARISON:  07/12/2017 FINDINGS: Stable changes from prior CABG surgery. Cardiac silhouette is top-normal in size. Stable left anterior chest wall biventricular cardioverter-defibrillator. No mediastinal or hilar masses. No evidence of adenopathy. Lungs are clear.  No pleural effusion or pneumothorax. Skeletal structures are grossly intact. IMPRESSION: No acute cardiopulmonary disease. Electronically Signed   By: Lajean Manes M.D.   On: 05/08/2019 16:33    EKG: Independently reviewed, with result as described above.   Assessment/Plan   [name] is a 73 y.o. male with medical history significant for coronary artery disease status post CABG in 2001, chronic combined systolic/diastolic heart failure with most recent echocardiogram in September 2019 showing LVEF 45 to 50%, type 2 diabetes mellitus, hypertension, stage III chronic kidney disease with baseline creatinine of 1.6-1.8, who is admitted to [facility] on 05/08/2019 with suspected acute COPD exacerbation after presenting from home to [facility] emergency department complaining of shortness of breath.    Principal Problem:   COPD with acute exacerbation ([place]) Active Problems:   Chronic systolic heart failure (HCC)   Diabetes type 2, controlled (HCC)   Chronic renal impairment, stage 3 (moderate)   Essential hypertension   Elevated troponin   SOB (shortness of breath)   #) Acute COPD exacerbation: Suspected diagnosis on the basis of presenting 3 days of progressive shortness of breath associated with new onset productive cough in the context of a long prior smoking history with associated long suspected underlying COPD. The patient reports that the above presentation involving continued shortness of breath and new onset productive cough feels very similar to the symptoms  that he was experiencing at the time of hospitalization in February 2019 for acute hypoxic respiratory failure due to multi focal pneumonia identified on CT of the chest, but not initially seen on chest x-ray, with associated suspected acute COPD exacerbation. Will pursue CT of the chest to evaluate for underlying infiltrate not seen on initial chest x-ray. Will also follow for results of nasopharyngeal COVID-19 PCR screen. ACS appears less likely as the patient reports absolutely no worsening of shortness of breath with exertion and no associated chest discomfort, while EKG shows no evidence of acute ischemic changes.  Also consider the possibility of acute on chronic combined heart failure, however, this also appears to be clinically less likely as the patient denies any associated orthopnea, PND, worsening peripheral edema, recent increase in weight, while BNP noted to be diminished relative to most recent prior value, all in the context of chest x-ray showing no evidence of infiltrate, edema, or effusion.  Overall, will initiate steroids scheduled breathing treatments, narrow spectrum antibiotics for suspected acute COPD exacerbation, and pursue CT of the chest to rule further evaluate for any underlying infiltrate not identified on presenting chest x-ray.  While most recent prior discharge summary from February 2019 recommended the patient pursue outpatient PFTs, it does not appear that he has done so.  In terms of antibiotic selection for this purpose, will pursue doxycycline as this will be less associated with causing QTc prolongation in setting of borderline QTC prolongation at presentation.   Plan: Combivent every 6 hours while awake.  As needed albuterol inhaler.  Solu-Medrol 40 mg IV every 6 hours.  Will initiate doxycycline for associated benefit of diminished duration of hospitalization.  Noncontrast CT of the chest, as above.  Check sputum culture and procalcitonin.  Monitor continuous pulse  oximetry.  Monitor on telemetry.  Check blood gas.  Add on serum magnesium level and check serum phosphorus level.  Recommend formal outpatient PFTs.  Follow for result of screening COVID-19 PCR.     #) Mildly elevated high-sensitivity troponin I: Suspect that this is on the basis of underlying acute COPD exacerbation, with contribution from diminished renal clearance in the setting of stage III chronic kidney disease.  ACS appears less likely as the patient reports no worsening of his shortness of breath with exertion and denies any associated chest pain.  Furthermore, EKG shows no evidence of acute ischemic changes.  Plan: Monitor on telemetry.  Repeat troponin in the morning.  Consider discussing case with cardiology in the morning.  Work-up and management of suspected presenting acute COPD exacerbation.  Full dose aspirin x1.  Continue home statin.  Continue home beta-blocker.  Add on serum magnesium level.     #) Chronic combined systolic/diastolic heart failure: With most recent echocardiogram performed in September 2018 showing evidence of LVEF of 45 to 50%, representing a improvement in systolic function relative to 2016 at which time AICD was placed in response to prolonged diminished LVEF of 20 to 25%.  Most recent echocardiogram also showed evidence of grade 1 diastolic dysfunction.  No evidence of acutely decompensated heart failure at this time, including no orthopnea, PND, worsening peripheral edema, no elevation of BNP, and no evidence of radiographic findings suggestive of acutely decompensated heart failure.  On Lasix 80 mg p.o. twice daily as well as Coreg and Imdur at home.  Plan: Monitor strict I's and O's and daily weights.  Repeat BMP in the morning.  Continue home beta-blocker, Imdur, and Lasix.  Check serum magnesium level.     #) Type 2 diabetes mellitus: On Trulicity and glipizide as an outpatient.  Presenting blood sugar per presenting CMP noted to be 134.  Plan:  Accu-Cheks before every meal and at bedtime with low-dose sliding scale insulin.  Will hold home glipizide and Trulicity during this hospitalization.     #) Stage III chronic kidney disease: Suspected to be on the basis of diabetic nephropathy, with baseline creatinine noted to be 1.6-1.8.  Presenting labs reflect creatinine within this baseline range.  Plan: Monitor strict I's and O's and daily weights.  Attempt avoid nephrotoxic agents.  Repeat BMP in the morning.  In the setting of presenting shortness of breath, will also check serum phosphorus and magnesium levels.     DVT prophylaxis: Lovenox 40 mg subcu daily Code Status: Full code Family Communication: None Disposition Plan:  Per Rounding Team Consults called: None Admission status: Inpatient; stepdown unit (due to elevated troponin).     PLEASE NOTE THAT DRAGON DICTATION SOFTWARE WAS USED IN THE CONSTRUCTION OF THIS NOTE.   [place] [facility] Pager [phone] From 3PM- 11PM.   Otherwise, please contact night-coverage  [email or web address] Password [email or web address]  05/08/2019, 9:22 PM

## 2019-05-08 NOTE — ED Triage Notes (Signed)
Pt presents to UC w/ c/o sob x3 days. Pt states it has gotten worse and worsens with movement.

## 2019-05-08 NOTE — Telephone Encounter (Signed)
Currently in [facility] ED.  Wanted to let Dr Domenic Polite and Dr Lovena Le know his device gave out and that someone should be contacting them soon about it.

## 2019-05-09 ENCOUNTER — Encounter (HOSPITAL_COMMUNITY): Payer: Self-pay | Admitting: Internal Medicine

## 2019-05-09 DIAGNOSIS — J441 Chronic obstructive pulmonary disease with (acute) exacerbation: Principal | ICD-10-CM

## 2019-05-09 DIAGNOSIS — R0602 Shortness of breath: Secondary | ICD-10-CM | POA: Diagnosis present

## 2019-05-09 LAB — CBC WITH DIFFERENTIAL/PLATELET
Abs Immature Granulocytes: 0.06 10*3/uL (ref 0.00–0.07)
Basophils Absolute: 0.1 10*3/uL (ref 0.0–0.1)
Basophils Relative: 1 %
Eosinophils Absolute: 0.1 10*3/uL (ref 0.0–0.5)
Eosinophils Relative: 1 %
HCT: 42.8 % (ref 39.0–52.0)
Hemoglobin: 14 g/dL (ref 13.0–17.0)
Immature Granulocytes: 1 %
Lymphocytes Relative: 13 %
Lymphs Abs: 1.5 10*3/uL (ref 0.7–4.0)
MCH: 32.3 pg (ref 26.0–34.0)
MCHC: 32.7 g/dL (ref 30.0–36.0)
MCV: 98.8 fL (ref 80.0–100.0)
Monocytes Absolute: 0.3 10*3/uL (ref 0.1–1.0)
Monocytes Relative: 3 %
Neutro Abs: 9.2 10*3/uL — ABNORMAL HIGH (ref 1.7–7.7)
Neutrophils Relative %: 81 %
Platelets: 310 10*3/uL (ref 150–400)
RBC: 4.33 MIL/uL (ref 4.22–5.81)
RDW: 14.1 % (ref 11.5–15.5)
WBC: 11.2 10*3/uL — ABNORMAL HIGH (ref 4.0–10.5)
nRBC: 0 % (ref 0.0–0.2)

## 2019-05-09 LAB — MAGNESIUM
Magnesium: 2.4 mg/dL (ref 1.7–2.4)
Magnesium: 2.3 mg/dL (ref 1.7–2.4)

## 2019-05-09 LAB — EXPECTORATED SPUTUM ASSESSMENT W GRAM STAIN, RFLX TO RESP C

## 2019-05-09 LAB — BASIC METABOLIC PANEL
Anion gap: 10 (ref 5–15)
BUN: 22 mg/dL (ref 8–23)
CO2: 24 mmol/L (ref 22–32)
Calcium: 9.3 mg/dL (ref 8.9–10.3)
Chloride: 106 mmol/L (ref 98–111)
Creatinine, Ser: 2.04 mg/dL — ABNORMAL HIGH (ref 0.61–1.24)
GFR calc Af Amer: 37 mL/min — ABNORMAL LOW (ref >60)
GFR calc non Af Amer: 32 mL/min — ABNORMAL LOW (ref >60)
Glucose, Bld: 169 mg/dL — ABNORMAL HIGH (ref 70–99)
Potassium: 4.7 mmol/L (ref 3.5–5.1)
Sodium: 140 mmol/L (ref 135–145)

## 2019-05-09 LAB — BLOOD GAS, VENOUS
Acid-Base Excess: 3.2 mmol/L — ABNORMAL HIGH (ref 0.0–2.0)
Bicarbonate: 25.9 mmol/L (ref 20.0–28.0)
Drawn by: 4252
FIO2: 21
O2 Saturation: 60.1 %
Patient temperature: 37
pCO2, Ven: 45.5 mmHg (ref 44.0–60.0)
pH, Ven: 7.4 (ref 7.250–7.430)
pO2, Ven: 33 mmHg (ref 32.0–45.0)

## 2019-05-09 LAB — PHOSPHORUS: Phosphorus: 1.5 mg/dL — ABNORMAL LOW (ref 2.5–4.6)

## 2019-05-09 LAB — TROPONIN I (HIGH SENSITIVITY): Troponin I (High Sensitivity): 116 ng/L (ref <18)

## 2019-05-09 LAB — CBG MONITORING, ED
Glucose-Capillary: 332 mg/dL — ABNORMAL HIGH (ref 70–99)
Glucose-Capillary: 302 mg/dL — ABNORMAL HIGH (ref 70–99)

## 2019-05-09 LAB — GLUCOSE, CAPILLARY: Glucose-Capillary: 249 mg/dL — ABNORMAL HIGH (ref 70–99)

## 2019-05-09 LAB — PROCALCITONIN: Procalcitonin: <0.1 ng/mL

## 2019-05-09 LAB — SARS CORONAVIRUS 2 (TAT 6-24 HRS): SARS Coronavirus 2: NEGATIVE

## 2019-05-09 MED ORDER — INFLUENZA VAC A&B SA ADJ QUAD 0.5 ML IM PRSY
0.5000 mL | PREFILLED_SYRINGE | INTRAMUSCULAR | Status: AC
  Administered 2019-05-10: 13:00:00 0.5 mL via INTRAMUSCULAR
  Filled 2019-05-09: qty 0.5

## 2019-05-09 MED ORDER — DOCUSATE SODIUM 100 MG PO CAPS
100.0000 mg | ORAL_CAPSULE | Freq: Two times a day (BID) | ORAL | Status: DC
  Administered 2019-05-10: 100 mg via ORAL
  Filled 2019-05-09: qty 1

## 2019-05-09 MED ORDER — CHLORHEXIDINE GLUCONATE CLOTH 2 % EX PADS
6.0000 | MEDICATED_PAD | Freq: Every day | CUTANEOUS | Status: DC
  Administered 2019-05-10: 13:00:00 6 via TOPICAL

## 2019-05-09 NOTE — ED Notes (Signed)
Patient transported to CT 

## 2019-05-09 NOTE — ED Notes (Addendum)
Following Interrogation of device, [employer] called to report Pt's Pacemaker is showing 4% PVC and 1.8 years left on battery life, rhythm is normal otherwise. MD Notified.

## 2019-05-09 NOTE — ED Notes (Signed)
LLE noted

## 2019-05-09 NOTE — Progress Notes (Signed)
Patient Demographics:    [name], is a 73 y.o. male, DOB - [birth year], [id number]  Admit date - 05/08/2019   Admitting Physician Rhetta Mura, DO  Outpatient Primary MD for the patient is Doree Albee, MD  LOS - 1   Chief Complaint  Patient presents with   Chest Pain        Subjective:    [name] today has no fevers, no emesis,  No chest pain,   --Cough, shortness of breath, wheezing persist -No pleuritic symptoms  Assessment  & Plan :    Principal Problem:   COPD with acute exacerbation ([place]) Active Problems:   Chronic systolic heart failure (HCC)   Diabetes type 2, controlled (HCC)   Chronic renal impairment, stage 3 (moderate)   Essential hypertension   Elevated troponin   SOB (shortness of breath)  Brief Summary:- 73 year old male with history of coronary artery disease and suspected underlying COPD in the setting of significant prior smoking history, who is admitted with suspected acute COPD exacerbation   A/p 1) acute COPD exacerbation--shortness of breath cough and wheezing persist, continue bronchodilators, IV Solu-Medrol, doxycycline -CT chest without acute findings, specifically no pneumonia -COVID-19 negative -Procalcitonin is less than 0.1  2)HFrEF-status post AICD,-patient with acute exacerbation of combined systolic and diastolic dysfunction CHF with EF of 45 to 50% -mild troponin elevation noted, no chest pains -BNP is 185 which is less than previous baseline -Appears mostly compensated at this time, continue Coreg, isosorbide and Lasix  3)CKD III--- creatinine is currently 2.0, renal function currently at baseline,  renally adjust medications, avoid nephrotoxic agents/dehydration/hypotension  4)DM2--recent A1c 6.8 reflecting excellent diabetic control, hold glipizide and Trulicity, Use Novolog/Humalog Sliding scale insulin with  Accu-Cheks/Fingersticks as ordered   5)HTN--stable, continue Coreg 5 mg twice daily,  6)CAD--- stable, no chest pains no ACS type symptoms, continue Imdur, simvastatin, aspirin and Coreg -Troponin peaked at 123,   Disposition/Need for in-Hospital Stay- patient unable to be discharged at this time due to --PT exacerbation with hypoxia requiring oxygen supplementation and IV steroids*  Code Status : full  Family Communication:   NA (patient is alert, awake and coherent)   Disposition Plan  : home  Consults  :  na  DVT Prophylaxis  :  Lovenox -  - SCDs    Lab Results  Component Value Date   PLT 310 05/09/2019    Inpatient Medications  Scheduled Meds:  allopurinol  100 mg Oral Daily   aspirin EC  81 mg Oral Daily   carvedilol  25 mg Oral BID WC   Chlorhexidine Gluconate Cloth  6 each Topical Daily   docusate sodium  100 mg Oral Q12H   doxycycline  100 mg Oral Q12H   enoxaparin (LOVENOX) injection  40 mg Subcutaneous Q24H   furosemide  80 mg Oral BID   gabapentin  300 mg Oral BID   [START ON 05/10/2019] influenza vaccine adjuvanted  0.5 mL Intramuscular Tomorrow-1000   insulin aspart  0-15 Units Subcutaneous TID WC   Ipratropium-Albuterol  1 puff Inhalation Q6H WA   isosorbide mononitrate  60 mg Oral TID   methylPREDNISolone (SOLU-MEDROL) injection  40 mg Intravenous Q6H   simvastatin  40 mg Oral Daily   sodium  chloride flush  3 mL Intravenous Once   sodium chloride flush  3 mL Intravenous Q12H   Continuous Infusions: PRN Meds:.acetaminophen **OR** acetaminophen, albuterol, HYDROcodone-acetaminophen    Anti-infectives (From admission, onward)   Start     Dose/Rate Route Frequency Ordered Stop   05/09/19 0000  doxycycline (VIBRA-TABS) tablet 100 mg     100 mg Oral Every 12 hours 05/08/19 2348          Objective:   Vitals:   05/09/19 1600 05/09/19 1700 05/09/19 1800 05/09/19 1805  BP: (!) 148/91 121/74    Pulse: 82 84    Resp: 12 14    Temp:       TempSrc:      SpO2: 93% 96%    Weight:    116.4 kg  Height:   6\' 1"  (1.854 m)     Wt Readings from Last 3 Encounters:  05/09/19 116.4 kg  02/12/19 114.4 kg  06/01/18 106.6 kg     Intake/Output Summary (Last 24 hours) at 05/09/2019 1831 Last data filed at 05/09/2019 0953 Gross per 24 hour  Intake 120 ml  Output 500 ml  Net -380 ml     Physical Exam  Gen:- Awake Alert, speaking in short sentences HEENT:- [place].AT, No sclera icterus Neck-Supple Neck,No JVD,.  Lungs-diminished in bases, few scattered wheezes, no rhonchi CV- S1, S2 normal, regular , left subclavian area with AICD in situ Abd-  +ve B.Sounds, Abd Soft, No tenderness,    Extremity/Skin:- +ve  edema, pedal pulses present  Psych-affect is appropriate, oriented x3 Neuro-no new focal deficits, no tremors   Data Review:   Micro Results Recent Results (from the past 240 hour(s))  SARS CORONAVIRUS 2 (TAT 6-24 HRS) Nasopharyngeal Nasopharyngeal Swab     Status: None   Collection Time: 05/08/19  8:23 PM   Specimen: Nasopharyngeal Swab  Result Value Ref Range Status   SARS Coronavirus 2 NEGATIVE NEGATIVE Final    Comment: (NOTE) SARS-CoV-2 target nucleic acids are NOT DETECTED. The SARS-CoV-2 RNA is generally detectable in upper and lower respiratory specimens during the acute phase of infection. Negative results do not preclude SARS-CoV-2 infection, do not rule out co-infections with other pathogens, and should not be used as the sole basis for treatment or other patient management decisions. Negative results must be combined with clinical observations, patient history, and epidemiological information. The expected result is Negative. Fact Sheet for Patients: SugarRoll.be Fact Sheet for Healthcare Providers: https://www.woods-mathews.com/ This test is not yet approved or cleared by the [place] FDA and  has been authorized for detection and/or diagnosis of  SARS-CoV-2 by FDA under an Emergency Use Authorization (EUA). This EUA will remain  in effect (meaning this test can be used) for the duration of the COVID-19 declaration under Section 56 4(b)(1) of the Act, 21 U.S.C. section 360bbb-3(b)(1), unless the authorization is terminated or revoked sooner. Performed at [facility], [place] [address], [place], [place] [address]   Expectorated sputum assessment w rflx to resp cult     Status: None   Collection Time: 05/09/19  2:54 AM   Specimen: Expectorated Sputum  Result Value Ref Range Status   Specimen Description EXPECTORATED SPUTUM  Final   Special Requests NONE  Final   Sputum evaluation   Final    THIS SPECIMEN IS ACCEPTABLE FOR SPUTUM CULTURE Performed at [facility], [address] [place], [place] [address]    Report Status 05/09/2019 FINAL  Final  Culture, respiratory  Status: None (Preliminary result)   Collection Time: 05/09/19  2:54 AM  Result Value Ref Range Status   Specimen Description   Final    EXPECTORATED SPUTUM Performed at [facility], [address], [place], [place] [address]    Special Requests   Final    NONE Reflexed from [id number] Performed at [facility], [address] [place], [place] [address]    Gram Stain   Final    RARE WBC PRESENT, PREDOMINANTLY PMN RARE SQUAMOUS EPITHELIAL CELLS PRESENT MODERATE GRAM NEGATIVE RODS RARE GRAM POSITIVE RODS Performed at [facility], [address], [place], [place] [address]    Culture PENDING  Incomplete   Report Status PENDING  Incomplete    Radiology Reports DG Chest 2 View  Result Date: 05/08/2019 CLINICAL DATA:  States he was advised to come here because his pacemaker is not working. Worsening SOB x 3 days. EXAM: CHEST - 2 VIEW COMPARISON:  07/12/2017 FINDINGS: Stable changes from prior CABG surgery. Cardiac silhouette is top-normal in size. Stable left anterior chest wall biventricular cardioverter-defibrillator. No mediastinal or hilar  masses. No evidence of adenopathy. Lungs are clear.  No pleural effusion or pneumothorax. Skeletal structures are grossly intact. IMPRESSION: No acute cardiopulmonary disease. Electronically Signed   By: Lajean Manes M.D.   On: 05/08/2019 16:33   CT CHEST WO CONTRAST  Result Date: 05/09/2019 CLINICAL DATA:  Shortness of breath. Multifocal pneumonia. EXAM: CT CHEST WITHOUT CONTRAST TECHNIQUE: Multidetector CT imaging of the chest was performed following the standard protocol without IV contrast. COMPARISON:  July 01, 2017. FINDINGS: Cardiovascular: Atherosclerotic changes are noted of the thoracic aorta. There is no thoracic aortic aneurysm. The patient is status post prior median sternotomy and CABG. There is a stable lobular appearance of the main pulmonary artery. The heart size is normal. Mediastinum/Nodes: --there are slightly prominent but stable mediastinal and hilar lymph nodes. --No axillary lymphadenopathy. --No supraclavicular lymphadenopathy. --Normal thyroid gland. --The esophagus is unremarkable Lungs/Pleura: There are mild emphysematous changes bilaterally. There is no focal infiltrate. No pneumothorax. No large pleural effusion. Upper Abdomen: Abdominal aortic endograft is noted. Bilateral renal artery stents are noted. There is a stent involving the celiac axis and SMA. The spleen is atrophic. Musculoskeletal: No chest wall abnormality. No acute or significant osseous findings. Review of the MIP images confirms the above findings. IMPRESSION: No acute abnormality.  The lungs are clear. Aortic Atherosclerosis (ICD10-I70.0) and Emphysema (ICD10-J43.9). Electronically Signed   By: Constance Holster M.D.   On: 05/09/2019 01:12     CBC Recent Labs  Lab 05/08/19 1601 05/09/19 0433  WBC 8.7 11.2*  HGB 14.1 14.0  HCT 43.4 42.8  PLT 330 310  MCV 99.5 98.8  MCH 32.3 32.3  MCHC 32.5 32.7  RDW 14.0 14.1  LYMPHSABS  --  1.5  MONOABS  --  0.3  EOSABS  --  0.1  BASOSABS  --  0.1     Chemistries  Recent Labs  Lab 05/08/19 1601 05/08/19 1734 05/09/19 0433  NA 136  --  140  K 4.2  --  4.7  CL 103  --  106  CO2 24  --  24  GLUCOSE 134*  --  169*  BUN 20  --  22  CREATININE 1.76*  --  2.04*  CALCIUM 9.3  --  9.3  MG  --  2.4 2.3  AST 29  --   --   ALT 29  --   --  ALKPHOS 71  --   --   BILITOT 0.6  --   --    ------------------------------------------------------------------------------------------------------------------ No results for input(s): CHOL, HDL, LDLCALC, TRIG, CHOLHDL, LDLDIRECT in the last 72 hours.  Lab Results  Component Value Date   HGBA1C 6.8 (H) 02/12/2019   ------------------------------------------------------------------------------------------------------------------ No results for input(s): TSH, T4TOTAL, T3FREE, THYROIDAB in the last 72 hours.  Invalid input(s): FREET3 ------------------------------------------------------------------------------------------------------------------ No results for input(s): VITAMINB12, FOLATE, FERRITIN, TIBC, IRON, RETICCTPCT in the last 72 hours.  Coagulation profile No results for input(s): INR, PROTIME in the last 168 hours.  No results for input(s): DDIMER in the last 72 hours.  Cardiac Enzymes No results for input(s): CKMB, TROPONINI, MYOGLOBIN in the last 168 hours.  Invalid input(s): CK ------------------------------------------------------------------------------------------------------------------    Component Value Date/Time   BNP 185.0 (H) 05/08/2019 1601     Roxan Hockey M.D on 05/09/2019 at 6:31 PM  Go to [email or web address] - for contact info  [facility] - Office  [phone]

## 2019-05-10 LAB — RENAL FUNCTION PANEL
Albumin: 3.3 g/dL — ABNORMAL LOW (ref 3.5–5.0)
Anion gap: 12 (ref 5–15)
BUN: 33 mg/dL — ABNORMAL HIGH (ref 8–23)
CO2: 22 mmol/L (ref 22–32)
Calcium: 8.9 mg/dL (ref 8.9–10.3)
Chloride: 103 mmol/L (ref 98–111)
Creatinine, Ser: 1.9 mg/dL — ABNORMAL HIGH (ref 0.61–1.24)
GFR calc Af Amer: 40 mL/min — ABNORMAL LOW (ref >60)
GFR calc non Af Amer: 35 mL/min — ABNORMAL LOW (ref >60)
Glucose, Bld: 357 mg/dL — ABNORMAL HIGH (ref 70–99)
Phosphorus: 3.8 mg/dL (ref 2.5–4.6)
Potassium: 4.2 mmol/L (ref 3.5–5.1)
Sodium: 137 mmol/L (ref 135–145)

## 2019-05-10 LAB — GLUCOSE, CAPILLARY
Glucose-Capillary: 206 mg/dL — ABNORMAL HIGH (ref 70–99)
Glucose-Capillary: 308 mg/dL — ABNORMAL HIGH (ref 70–99)

## 2019-05-10 LAB — MRSA PCR SCREENING: MRSA by PCR: NEGATIVE

## 2019-05-10 MED ORDER — PREDNISONE 20 MG PO TABS: 40.0000 mg | ORAL_TABLET | Freq: Every day | ORAL | 0 refills | Status: DC

## 2019-05-10 MED ORDER — DOXYCYCLINE HYCLATE 100 MG PO TABS: 100.0000 mg | ORAL_TABLET | Freq: Two times a day (BID) | ORAL | 0 refills | Status: AC

## 2019-05-10 MED ORDER — ASPIRIN EC 81 MG PO TBEC: 81.0000 mg | DELAYED_RELEASE_TABLET | Freq: Every day | ORAL | 3 refills | Status: DC

## 2019-05-10 MED ORDER — PREDNISONE 20 MG PO TABS
60.0000 mg | ORAL_TABLET | Freq: Once | ORAL | Status: AC
  Administered 2019-05-10: 60 mg via ORAL
  Filled 2019-05-10: qty 3

## 2019-05-10 MED ORDER — ALBUTEROL SULFATE (2.5 MG/3ML) 0.083% IN NEBU: 2.5000 mg | INHALATION_SOLUTION | Freq: Four times a day (QID) | RESPIRATORY_TRACT | 12 refills | Status: DC | PRN

## 2019-05-10 MED ORDER — BUDESONIDE-FORMOTEROL FUMARATE 160-4.5 MCG/ACT IN AERO: 2.0000 | INHALATION_SPRAY | Freq: Two times a day (BID) | RESPIRATORY_TRACT | 4 refills | Status: DC

## 2019-05-10 MED ORDER — GUAIFENESIN ER 600 MG PO TB12: 600.0000 mg | ORAL_TABLET | Freq: Two times a day (BID) | ORAL | 0 refills | Status: AC

## 2019-05-10 MED ORDER — METHYLPREDNISOLONE SODIUM SUCC 40 MG IJ SOLR: 40.0000 mg | Freq: Two times a day (BID) | INTRAMUSCULAR | Status: DC

## 2019-05-10 NOTE — Discharge Instructions (Signed)
1)Very low-salt diet advised 2)Weigh yourself daily, call if you gain more than 3 pounds in 1 day or more than 5 pounds in 1 week as your diuretic medications may need to be adjusted 3)Limit your Fluid  intake to no more than 60 ounces (1.8 Liters) per day 4)Use Nebulizer machine as prescribed 5)follow-up with primary care physician for recheck within a week

## 2019-05-10 NOTE — Plan of Care (Signed)

## 2019-05-10 NOTE — Discharge Summary (Signed)
[name], is a 73 y.o. male  DOB [birth year]  MRN [id number].  Admission date:  05/08/2019  Admitting Physician  Rhetta Mura, DO  Discharge Date:  05/10/2019   Primary MD  Doree Albee, MD  Recommendations for primary care physician for things to follow:   1)Very low-salt diet advised 2)Weigh yourself daily, call if you gain more than 3 pounds in 1 day or more than 5 pounds in 1 week as your diuretic medications may need to be adjusted 3)Limit your Fluid  intake to no more than 60 ounces (1.8 Liters) per day 4)Use Nebulizer machine as prescribed 5)follow-up with primary care physician for recheck within a week  Admission Diagnosis  Dyspnea on exertion [R06.00] Elevated troponin [R77.8]   Discharge Diagnosis  Dyspnea on exertion [R06.00] Elevated troponin [R77.8]   Principal Problem:   COPD with acute exacerbation ([place]) Active Problems:   Chronic systolic heart failure ([place])   Hx of CABG 2001, abnormal but low risk Myoview April 2015   Diabetes type 2, controlled ([place])   Chronic renal impairment, stage 3 (moderate)   Essential hypertension   Elevated troponin   SOB (shortness of breath)      Past Medical History:  Diagnosis Date  . AAA (abdominal aortic aneurysm) ([place])    Followed by Dr. Donnetta Hutching  . AICD (automatic cardioverter/defibrillator) present   . Angioedema    Secondary to ACE inhibitor  . Arthritis   . Bell palsy   . Carotid artery disease ([place])   . Coronary atherosclerosis of native coronary artery    Multivessel status post CABG  . Essential hypertension, benign   . Gout   . Hernia of abdominal wall   . Hypercholesteremia   . Ischemic cardiomyopathy    LVEF 45-50% 11/2011  . Migraines   . Morbid obesity ([place]) 02/12/2019  . Myocardial infarction ([place])   . Nephrolithiasis   . PAD (peripheral artery disease) ([place])    Followed by Dr. Donnetta Hutching  . Sleep  apnea   . Type 2 diabetes mellitus ([place])     Past Surgical History:  Procedure Laterality Date  . ABDOMINAL AORTIC ANEURYSM REPAIR  November 19, 2013   [facility] :  Dr. Sammuel Hines  . ABDOMINAL AORTIC ANEURYSM REPAIR  09-07-2014   [facility]  . BI-VENTRICULAR IMPLANTABLE CARDIOVERTER DEFIBRILLATOR  (CRT-D)  11/05/2014  . BYPASS GRAFT POPLITEAL TO POPLITEAL Left 06/08/2014   Procedure: BYPASS GRAFT FEMORAL ARTERY TO ABOVE KNEE POPLITEAL;  Surgeon: Rosetta Posner, MD;  Location: [place];  Service: Vascular;  Laterality: Left;  . CARDIAC CATHETERIZATION N/A 10/02/2014   Procedure: Left Heart Cath and Cors/Grafts Angiography;  Surgeon: Belva Crome, MD;  Location: [place] [facility];  Service: Cardiovascular;  Laterality: N/A;  . CARDIAC CATHETERIZATION  "several"  . CATARACT EXTRACTION W/PHACO  03/23/2011   Procedure: CATARACT EXTRACTION PHACO AND INTRAOCULAR LENS PLACEMENT (IOC);  Surgeon: Tonny Branch;  Location: [facility];  Service: Ophthalmology;  Laterality: Left;  CDE: 15.99  .  CATARACT EXTRACTION W/PHACO  04/17/2011   Procedure: CATARACT EXTRACTION PHACO AND INTRAOCULAR LENS PLACEMENT (IOC);  Surgeon: Tonny Branch;  Location: [facility];  Service: Ophthalmology;  Laterality: Right;  CDE: 23.45  . CORONARY ARTERY BYPASS GRAFT  2001   LIMA to LAD, SVG to diagonal and ramus, SVG to OM, SVG to AM  . DUPUYTREN CONTRACTURE RELEASE Left 2009  . EP IMPLANTABLE DEVICE N/A 11/05/2014   Procedure: BiV ICD Insertion CRT-D;  Surgeon: Evans Lance, MD;  Location: [place] [facility];  Service: Cardiovascular;  Laterality: N/A;  . EP IMPLANTABLE DEVICE N/A 02/24/2015   Procedure: Lead Revision/Repair;  Surgeon: Evans Lance, MD;  Location: [place] [facility];  Service: Cardiovascular;  Laterality: N/A;  . EXTRACORPOREAL SHOCK WAVE LITHOTRIPSY  X 1  . EYE SURGERY    . FOOT SURGERY Left 1963   "gangrene"  . ICD LEAD REMOVAL  02/24/2015  . LOWER EXTREMITY ANGIOGRAM Bilateral 04/28/2015   Procedure: Lower Extremity  Angiogram;  Surgeon: Rosetta Posner, MD;  Location: [place] [facility];  Service: Cardiovascular;  Laterality: Bilateral;  . PERIPHERAL VASCULAR CATHETERIZATION N/A 04/28/2015   Procedure: Abdominal Aortogram;  Surgeon: Rosetta Posner, MD;  Location: [place] [facility];  Service: Cardiovascular;  Laterality: N/A;     HPI  from the history and physical done on the day of admission:   - Chief Complaint: Shortness of breath  HPI: [name] is a 73 y.o. male with medical history significant for coronary artery disease status post CABG in 2001, chronic combined systolic/diastolic heart failure with most recent echocardiogram in September 2019 showing LVEF 45 to 50%, type 2 diabetes mellitus, hypertension, stage III chronic kidney disease with baseline creatinine of 1.6-1.8, who is [facility] to [facility] on [facility]08[facility]2019 with suspected acute COPD exacerbation after presenting from home to [facility] emergency department complaining of shortness of breath.   The patient reports 3 days of continuous, progressive shortness of breath associated with new onset productive cough.  Denies any worsening of his shortness of breath with exertion, and also denies any associated orthopnea, PND, or worsening of peripheral edema.  He is also not aware of any recent weight gain.  Denies any associated chest pain, palpitations, diaphoresis, nausea, vomiting.   The patient reports that the above presentation involving continued shortness of breath and new onset productive cough feels very similar to the symptoms that he was experiencing at the time of hospitalization in February 2019 for acute hypoxic respiratory failure due to multi focal pneumonia identified on CT of the chest, but not initially seen on chest x-ray, with associated suspected acute COPD exacerbation in the setting of a long prior smoking history.  Per discharge summary from this February 2019 hospitalization, the patient was recommended to  pursue outpatient PFTs, but it does not appear that he has done so.  He confirms that he is no longer smoking.  Denies any subjective fever, chills, rigors, or generalized myalgias. Denies any recent headache, neck stiffness, rhinitis, rhinorrhea, sore throat, abdominal pain, diarrhea, or rash. No recent traveling or known COVID-19 exposures.  Denies any recent trauma.   Past medical history is notable for history of coronary artery disease status post CABG in 2001.  The patient has undergone multiple subsequent left heart caths, with most recent occurring in May 2016.  The following were the results of his most recent coronary angiography: Bypass graft failure with total occlusion of the saphenous vein graft to the acute  marginal of the right coronary, total occlusion of the sequential saphenous vein graft to the diagonal and ramus intermedius, and total occlusion of the saphenous graft to the distal circumflex/obtuse marginal. Patent LIMA to the LAD. Total occlusion of the native LAD, the mid right coronary, second obtuse marginal, the first diagonal, and the ramus intermedius. The distal right coronary, the obtuse marginal, ramus intermedius are collateralized from the native circulation via the LIMA to LAD. Severe left ventricular systolic dysfunction with an inferobasal aneurysm, and an ejection fraction of 20-25%.  In the setting of after mentioned LVEF 20-25%, the patient underwent AICD placement in 2016 for primary prevention of sudden cardiac death.   Past medical history is also notable for history of chronic combined heart failure, with most recent echocardiogram performed on 02/13/2018 showing mild LVH, LVEF 45 to 50%, akinesis of the basal inferolateral myocardium, grade 1 diastolic dysfunction, and no evidence of significant valvular pathology.  He reports good compliance with his outpatient cardiac medications.   ED Course: Vital signs in the ED were notable for the following:  Temperature max 98.2; heart rate 63-74; blood pressure 129/61-150 1/77; respiratory rate 14-16, and oxygen saturation 97 to 99% on room air.  Labs in the ED were notable for the following: CMP notable for sodium 136, bicarbonate 24, creatinine 1.76 relative to most recent prior creatinine data point of 1.86 on 02/12/2019, glucose 134, liver enzymes found to be within normal limits.  BNP 185 compared to most recent prior value of 429 on 06/29/2017.  High-sensitivity troponin I initially 113, with repeat value of 123, with no prior high-sensitivity troponin I values for point of comparison.  CBC notable for white blood cell count of 8700, hemoglobin 14.  In the setting of presenting shortness of breath, nasopharyngeal COVID-19 PCR was obtained, with result currently pending.  Chest x-ray, per final radiology report, showed no evidence of acute cardiopulmonary process, including no evidence of infiltrate, edema, effusion, pneumothorax.  EKG showed sinus rhythm with multiple PVCs, ventricular rate 71, and no evidence of acute T wave or ST changes.  The [facility] for [facility] evaluation and management of suspected presenting acute COPD exacerbation.     Hospital Course:     Brief Summary:- 74 year old male with history of coronary artery disease and suspected underlying COPD in the setting of significant prior smoking history, who is admitted with suspected acute COPD exacerbation   A/p 1) acute COPD exacerbation--shortness of breath and wheezing resolved, cough improved -Patient ambulated in hallways O2 sats 95 to 96% on room air with ambulation -Transition to p.o. prednisone from IV Solu-Medrol,  -Discharge home on doxycycline, bronchodilators and mucolytics -CT chest without acute findings, specifically no pneumonia -COVID-19 negative -Procalcitonin is less than 0.1  2)HFrEF-status post AICD,-patient with acute exacerbation of combined systolic and  diastolic dysfunction CHF with EF of 45 to 50% -mild troponin elevation noted, no chest pains -BNP is 185 which is less than previous baseline -Appears mostly compensated at this time, continue Coreg, isosorbide and Lasix  3)CKD III--- creatinine is currently 1.90, currently at baseline,  renally adjust medications, avoid nephrotoxic agents / dehydration /hypotension  4)DM2--recent A1c 6.8 reflecting excellent diabetic control, continue home diabetic regimen including glipizide and Trulicity,   5)HTN--stable, continue amlodipine, Coreg, isosorbide and Lasix  6)CAD--- stable, no chest pains no ACS type symptoms, continue Imdur, simvastatin, aspirin and Coreg -Troponin peaked at 123, -Ambulated in hallways without chest pains or dyspnea on exertion, O2 sats 95  to 96% with ambulation today prior to discharge   [place] home in much improved/stable condition  Code Status : full  Family Communication:   NA (patient is alert, awake and coherent)  Consults  :  na  Discharge Condition: stable  Follow UP--- PCP within a week for recheck  Diet and Activity recommendation:  As advised  Discharge Instructions    Discharge Instructions    Call MD for:  difficulty breathing, headache or visual disturbances   Complete by: As directed    Call MD for:  extreme fatigue   Complete by: As directed    Call MD for:  persistant dizziness or light-headedness   Complete by: As directed    Call MD for:  persistant nausea and vomiting   Complete by: As directed    Call MD for:  severe uncontrolled pain   Complete by: As directed    Call MD for:  temperature >100.4   Complete by: As directed    Diet - low sodium heart healthy   Complete by: As directed    Discharge instructions   Complete by: As directed    1)Very low-salt diet advised 2)Weigh yourself daily, call if you gain more than 3 pounds in 1 day or more than 5 pounds in 1 week as your diuretic medications may need to  be adjusted 3)Limit your Fluid  intake to no more than 60 ounces (1.8 Liters) per day 4)Use Nebulizer machine as prescribed 5)follow-up with primary care physician for recheck within a week   Increase activity slowly   Complete by: As directed         Discharge Medications     Allergies as of 05/10/2019      Reactions   Ace Inhibitors Swelling   Throat and lips swelled; ended up in ED   Codeine Other (See Comments)    Delirium, hallucinations   Hydromorphone Nausea And Vomiting      Medication List    TAKE these medications   acetaminophen 500 MG tablet Commonly known as: TYLENOL Take 1,000 mg by mouth daily as needed for mild pain, moderate pain or headache (pain).   albuterol (2.5 MG/3ML) 0.083% nebulizer solution Commonly known as: PROVENTIL Take 3 mLs (2.5 mg total) by nebulization every 6 (six) hours as needed for wheezing or shortness of breath.   allopurinol 100 MG tablet Commonly known as: ZYLOPRIM TAKE 1 TABLET BY MOUTH ONCE DAILY.   amLODipine 5 MG tablet Commonly known as: NORVASC TAKE 1 TABLET ONCE DAILY. What changed:   how much to take  how to take this  when to take this  additional instructions   aspirin EC 81 MG tablet Take 1 tablet (81 mg total) by mouth daily with breakfast. What changed: when to take this   budesonide-formoterol 160-4.5 MCG/ACT inhaler Commonly known as: Symbicort Inhale 2 puffs into the lungs 2 (two) times daily.   carvedilol 25 MG tablet Commonly known as: COREG TAKE (1) TABLET TWICE DAILY. What changed: See the new instructions.   docusate sodium 100 MG capsule Commonly known as: COLACE Take 1 capsule (100 mg total) by mouth every 12 (twelve) hours.   doxycycline 100 MG tablet Commonly known as: VIBRA-TABS Take 1 tablet (100 mg total) by mouth 2 (two) times daily for 5 days.   furosemide 80 MG tablet Commonly known as: LASIX TAKE (1) TABLET TWICE DAILY. What changed: See the new instructions.     gabapentin 300 MG capsule Commonly known as: NEURONTIN TAKE  1 CAPSULE BY MOUTH TWICE DAILY.   glipiZIDE 10 MG tablet Commonly known as: GLUCOTROL Take 1 tablet (10 mg total) by mouth 2 (two) times daily.   guaiFENesin 600 MG 12 hr tablet Commonly known as: Mucinex Take 1 tablet (600 mg total) by mouth 2 (two) times daily for 10 days.   isosorbide mononitrate 60 MG 24 hr tablet Commonly known as: IMDUR TAKE 1 TABLET BY MOUTH 3 TIMES DAILY.   nitroGLYCERIN 0.4 MG SL tablet Commonly known as: Nitrostat PLACE 0.4 MG  UNDER THE TONGUE EVERY 5 MINUTES UP TO 3 DOSES AS NEEDED FOR CHEST PAIN.   potassium chloride 10 MEQ tablet Commonly known as: KLOR-CON TAKE 2 TABLETS BY MOUTH ONCE DAILY.   predniSONE 20 MG tablet Commonly known as: Deltasone Take 2 tablets (40 mg total) by mouth daily with breakfast.   simvastatin 40 MG tablet Commonly known as: ZOCOR TAKE 1 TABLET ONCE DAILY. What changed: when to take this   tamsulosin 0.4 MG Caps capsule Commonly known as: FLOMAX Take 1 capsule (0.4 mg total) by mouth daily after supper.   Trulicity [id number] Sopn Generic drug: Dulaglutide ONE INJECTION INTO SKIN EVERY WEEK. What changed: See the new instructions.   Vitamin D-3 125 MCG (5000 UT) Tabs Take 1 tablet by mouth daily.            Durable Medical Equipment  (From admission, onward)         Start     Ordered   05/10/19 1132  For home use only DME Nebulizer machine  Once    Question Answer Comment  Patient needs a nebulizer to treat with the following condition Dyspnea and respiratory abnormalities   Length of Need Lifetime      05/10/19 1131   05/10/19 1132  For home use only DME Nebulizer machine  Once    Comments: Please provide patient with nebulizer machine and supplies  Question Answer Comment  Patient needs a nebulizer to treat with the following condition Dyspnea and respiratory abnormalities   Length of Need Lifetime      05/10/19 1132          Major procedures and Radiology Reports - PLEASE review detailed and final reports for all details, in brief -   DG Chest 2 View  Result Date: 05/08/2019 CLINICAL DATA:  States he was advised to come here because his pacemaker is not working. Worsening SOB x 3 days. EXAM: CHEST - 2 VIEW COMPARISON:  07/12/2017 FINDINGS: Stable changes from prior CABG surgery. Cardiac silhouette is top-normal in size. Stable left anterior chest wall biventricular cardioverter-defibrillator. No mediastinal or hilar masses. No evidence of adenopathy. Lungs are clear.  No pleural effusion or pneumothorax. Skeletal structures are grossly intact. IMPRESSION: No acute cardiopulmonary disease. Electronically Signed   By: Lajean Manes M.D.   On: 05/08/2019 16:33   CT CHEST WO CONTRAST  Result Date: 05/09/2019 CLINICAL DATA:  Shortness of breath. Multifocal pneumonia. EXAM: CT CHEST WITHOUT CONTRAST TECHNIQUE: Multidetector CT imaging of the chest was performed following the standard protocol without IV contrast. COMPARISON:  July 01, 2017. FINDINGS: Cardiovascular: Atherosclerotic changes are noted of the thoracic aorta. There is no thoracic aortic aneurysm. The patient is status post prior median sternotomy and CABG. There is a stable lobular appearance of the main pulmonary artery. The heart size is normal. Mediastinum/Nodes: --there are slightly prominent but stable mediastinal and hilar lymph nodes. --No axillary lymphadenopathy. --No supraclavicular lymphadenopathy. --Normal thyroid gland. --The esophagus is  unremarkable Lungs/Pleura: There are mild emphysematous changes bilaterally. There is no focal infiltrate. No pneumothorax. No large pleural effusion. Upper Abdomen: Abdominal aortic endograft is noted. Bilateral renal artery stents are noted. There is a stent involving the celiac axis and SMA. The spleen is atrophic. Musculoskeletal: No chest wall abnormality. No acute or significant osseous findings. Review of  the MIP images confirms the above findings. IMPRESSION: No acute abnormality.  The lungs are clear. Aortic Atherosclerosis (ICD10-I70.0) and Emphysema (ICD10-J43.9). Electronically Signed   By: Constance Holster M.D.   On: 05/09/2019 01:12    Micro Results    Recent Results (from the past 240 hour(s))  SARS CORONAVIRUS 2 (TAT 6-24 HRS) Nasopharyngeal Nasopharyngeal Swab     Status: None   Collection Time: 05/08/19  8:23 PM   Specimen: Nasopharyngeal Swab  Result Value Ref Range Status   SARS Coronavirus 2 NEGATIVE NEGATIVE Final    Comment: (NOTE) SARS-CoV-2 target nucleic acids are NOT DETECTED. The SARS-CoV-2 RNA is generally detectable in upper and lower respiratory specimens during the acute phase of infection. Negative results do not preclude SARS-CoV-2 infection, do not rule out co-infections with other pathogens, and should not be used as the sole basis for treatment or other patient management decisions. Negative results must be combined with clinical observations, patient history, and epidemiological information. The expected result is Negative. Fact Sheet for Patients: SugarRoll.be Fact Sheet for Healthcare Providers: https://www.woods-mathews.com/ This test is not yet approved or cleared by the [place] FDA and  has been authorized for detection and/or diagnosis of SARS-CoV-2 by FDA under an Emergency Use Authorization (EUA). This EUA will remain  in effect (meaning this test can be used) for the duration of the COVID-19 declaration under Section 56 4(b)(1) of the Act, 21 U.S.C. section 360bbb-3(b)(1), unless the authorization is terminated or revoked sooner. Performed at [facility], [place] [address] [place], [place] [address]   Expectorated sputum assessment w rflx to resp cult     Status: None   Collection Time: 05/09/19  2:54 AM   Specimen: Expectorated Sputum  Result Value Ref Range Status   Specimen  Description EXPECTORATED SPUTUM  Final   Special Requests NONE  Final   Sputum evaluation   Final    THIS SPECIMEN IS ACCEPTABLE FOR SPUTUM CULTURE Performed at [facility], [address] [place], [place] [address]    Report Status 05/09/2019 FINAL  Final  Culture, respiratory     Status: None (Preliminary result)   Collection Time: 05/09/19  2:54 AM  Result Value Ref Range Status   Specimen Description   Final    EXPECTORATED SPUTUM Performed at [facility], [address] [place], [place] [address]    Special Requests   Final    NONE Reflexed from [id number] Performed at [facility], [address], [place], [place] [address]    Gram Stain   Final    RARE WBC PRESENT, PREDOMINANTLY PMN RARE SQUAMOUS EPITHELIAL CELLS PRESENT MODERATE GRAM NEGATIVE RODS RARE GRAM POSITIVE RODS    Culture   Final    CULTURE REINCUBATED FOR BETTER GROWTH Performed at [facility], [place] [address], [place], [place] [address]    Report Status PENDING  Incomplete  MRSA PCR Screening     Status: None   Collection Time: 05/09/19 11:30 PM   Specimen: Nasal Mucosa; Nasopharyngeal  Result Value Ref Range Status   MRSA by PCR NEGATIVE NEGATIVE Final    Comment:  The GeneXpert MRSA Assay (FDA approved for NASAL specimens only), is one component of a comprehensive MRSA colonization surveillance program. It is not intended to diagnose MRSA infection nor to guide or monitor treatment for MRSA infections. Performed at [facility], [address] [place], [place] [address]        Today   Palmer today has no new complaints --Ambulated in hallways without chest pains or dyspnea on exertion, O2 sats 95 to 96% with ambulation today prior to discharge          Patient has been seen and examined prior to discharge   Objective   Blood pressure 135/62, pulse 72, temperature 97.7 F (36.5 C), temperature source Oral, resp. rate 13, height 6\' 1"  (1.854 m),  weight 116.4 kg, SpO2 95 %.   Intake/Output Summary (Last 24 hours) at 05/10/2019 1238 Last data filed at 05/10/2019 0900 Gross per 24 hour  Intake 443 ml  Output 1800 ml  Net -1357 ml    Exam Gen:- Awake Alert, no acute distress  HEENT:- [place].AT, No sclera icterus Neck-Supple Neck,No JVD,.  Lungs-improved air movement, no wheezing, CV- S1, S2 normal, regular, left subclavian area with AICD in situ, prior sternotomy scar noted Abd-  +ve B.Sounds, Abd Soft, No tenderness,    Extremity/Skin:- No  edema,   good pulses Psych-affect is appropriate, oriented x3 Neuro-no new focal deficits, no tremors    Data Review   CBC w Diff:  Lab Results  Component Value Date   WBC 11.2 (H) 05/09/2019   HGB 14.0 05/09/2019   HCT 42.8 05/09/2019   PLT 310 05/09/2019   LYMPHOPCT 13 05/09/2019   BANDSPCT 0 12/15/2016   MONOPCT 3 05/09/2019   EOSPCT 1 05/09/2019   BASOPCT 1 05/09/2019    CMP:  Lab Results  Component Value Date   NA 137 05/10/2019   K 4.2 05/10/2019   CL 103 05/10/2019   CO2 22 05/10/2019   BUN 33 (H) 05/10/2019   CREATININE 1.90 (H) 05/10/2019   CREATININE 1.86 (H) 02/12/2019   PROT 7.3 05/08/2019   ALBUMIN 3.3 (L) 05/10/2019   BILITOT 0.6 05/08/2019   ALKPHOS 71 05/08/2019   AST 29 05/08/2019   ALT 29 05/08/2019  .   Total Discharge time is about 33 minutes  Roxan Hockey M.D on 05/10/2019 at 12:38 PM  Go to [email or web address] -  for contact info  [facility] - Office  [phone]

## 2019-05-10 NOTE — TOC Transition Note (Signed)
Transition of Care [facility]) - CM/SW Discharge Note   Patient Details  Name: [name] MRN: [id number] Date of Birth: 01-10-1946  [facility]) CM/SW Contact:  Marshell Garfinkel, RN Phone Number: 05/10/2019, 12:59 PM   Clinical Narrative:    Nebulizer order sent to [place] with [employer] to be delivered to patient's home.         Patient Goals and CMS Choice        Discharge Placement                       Discharge Plan and Services                DME Arranged: Nebulizer machine DME Agency: [employer] Date DME Agency Contacted: 05/10/19 Time DME Agency Contacted: [id number] Representative spoke with at DME Agency: [place] ([place]) Interventions     Readmission Risk Interventions No flowsheet data found.

## 2019-05-11 LAB — CULTURE, RESPIRATORY W GRAM STAIN: Culture: NORMAL

## 2019-05-12 NOTE — Telephone Encounter (Signed)
Reviewed faxed note & place in Dr Anastasio Champion folder to review.

## 2019-05-14 ENCOUNTER — Ambulatory Visit (INDEPENDENT_AMBULATORY_CARE_PROVIDER_SITE_OTHER): Payer: Medicare Other | Admitting: Nurse Practitioner

## 2019-05-19 NOTE — Telephone Encounter (Signed)
done

## 2019-05-21 NOTE — Telephone Encounter (Signed)
We will arrange followup with our PA/NP to schedule gen change out. GT

## 2019-05-26 ENCOUNTER — Other Ambulatory Visit: Payer: Self-pay | Admitting: Cardiology

## 2019-05-26 ENCOUNTER — Ambulatory Visit (INDEPENDENT_AMBULATORY_CARE_PROVIDER_SITE_OTHER): Payer: Medicare HMO | Admitting: *Deleted

## 2019-05-26 DIAGNOSIS — I255 Ischemic cardiomyopathy: Secondary | ICD-10-CM | POA: Diagnosis not present

## 2019-05-27 ENCOUNTER — Encounter (INDEPENDENT_AMBULATORY_CARE_PROVIDER_SITE_OTHER): Payer: Self-pay | Admitting: Nurse Practitioner

## 2019-05-27 ENCOUNTER — Ambulatory Visit (INDEPENDENT_AMBULATORY_CARE_PROVIDER_SITE_OTHER): Payer: Medicare HMO | Admitting: Nurse Practitioner

## 2019-05-27 ENCOUNTER — Other Ambulatory Visit: Payer: Self-pay

## 2019-05-27 ENCOUNTER — Encounter: Payer: Self-pay | Admitting: Nurse Practitioner

## 2019-05-27 VITALS — BP 142/68 | HR 66 | Temp 97.6°F | Resp 14 | Ht 73.0 in | Wt 261.6 lb

## 2019-05-27 DIAGNOSIS — N183 Chronic kidney disease, stage 3 unspecified: Secondary | ICD-10-CM

## 2019-05-27 DIAGNOSIS — E118 Type 2 diabetes mellitus with unspecified complications: Secondary | ICD-10-CM

## 2019-05-27 DIAGNOSIS — I739 Peripheral vascular disease, unspecified: Secondary | ICD-10-CM

## 2019-05-27 DIAGNOSIS — R079 Chest pain, unspecified: Secondary | ICD-10-CM

## 2019-05-27 DIAGNOSIS — E559 Vitamin D deficiency, unspecified: Secondary | ICD-10-CM | POA: Insufficient documentation

## 2019-05-27 DIAGNOSIS — E669 Obesity, unspecified: Secondary | ICD-10-CM | POA: Diagnosis not present

## 2019-05-27 DIAGNOSIS — I10 Essential (primary) hypertension: Secondary | ICD-10-CM | POA: Diagnosis not present

## 2019-05-27 LAB — CUP PACEART REMOTE DEVICE CHECK
Date Time Interrogation Session: 20210104065628
Implantable Lead Implant Date: 20160616
Implantable Lead Implant Date: 20161005
Implantable Lead Implant Date: 20161005
Implantable Lead Location: 753858
Implantable Lead Location: 753859
Implantable Lead Location: 753860
Implantable Lead Model: 7122
Implantable Pulse Generator Implant Date: 20160616
Pulse Gen Serial Number: 7288352

## 2019-05-27 NOTE — Patient Instructions (Signed)
Thank you for choosing [facility] as your medical provider! If you have any questions or concerns regarding your health care, please do not hesitate to call our office.  Continue all medication as prescribed.  Remember to increase your vitamin D 3 from 1 capsule a day to 2 capsules a day.  Please call your cardiologist and ask to be seen for an appointment regarding chest pain.  If you have any additional episodes of chest pain between now and an appointment to see your cardiologist, you need to proceed to the emergency department.  Let me know if you would like to be seen by the vascular doctor regarding your leg pain or the back specialist regarding your back pain.  Continue trying to lose weight.  See below for dietary recommendations to help with weight loss.  We will check blood work at your next appointment.  Please follow-up as scheduled in 3 months. We look forward to seeing you again soon! Have a great Valentine's Day!!  At [facility] we value your feedback. You may receive a survey about your visit today. Please share your experience as we strive to create trusting relationships with our patients to provide genuine, compassionate, quality care.  We appreciate your understanding and patience as we review any laboratory studies, imaging, and other diagnostic tests that are ordered as we care for you. We do our best to address any and all results in a timely manner. If you do not hear about test results within 1 week, please do not hesitate to contact us. If we referred you to a specialist during your visit or ordered imaging testing, contact the office if you have not been contacted to be scheduled within 1 weeks.  We also encourage the use of MyChart, which contains your medical information for your review as well. If you are not enrolled in this feature, an access code is on this after visit summary for your convenience. Thank you for allowing [place] to be involved in  your care.   [place] [facility] Dietary Recommendations for Weight Loss What to Avoid . Avoid added sugars o Often added sugar can be found in processed foods such as many condiments, dry cereals, cakes, cookies, chips, crisps, crackers, candies, sweetened drinks, etc.  o Read labels and AVOID/DECREASE use of foods with the following in their ingredient list: Sugar, fructose, high fructose corn syrup, sucrose, glucose, maltose, dextrose, molasses, cane sugar, brown sugar, any type of syrup, agave nectar, etc.   . Avoid snacking in between meals . Avoid foods made with flour o If you are going to eat food made with flour, choose those made with whole-grains; and, minimize your consumption as much as is tolerable . Avoid processed foods o These foods are generally stocked in the middle of the grocery store. Focus on shopping on the perimeter of the grocery.  . Avoid Meat  o We recommend following a plant-based diet at [facility]. Thus, we recommend avoiding meat as a general rule. Consider eating beans, legumes, eggs, and/or dairy products for regular protein sources o If you plan on eating meat limit to 4 ounces of meat at a time and choose lean options such as Fish, chicken, [place]. Avoid red meat intake such as pork and/or steak What to Include . Vegetables o GREEN LEAFY VEGETABLES: Kale, spinach, mustard greens, collard greens, cabbage, broccoli, etc. o OTHER: Asparagus, cauliflower, eggplant, carrots, peas, Brussel sprouts, tomatoes, bell peppers, zucchini, beets, cucumbers, etc. . Grains, seeds,  and legumes o Beans: kidney beans, black eyed peas, garbanzo beans, black beans, pinto beans, etc. o Whole, unrefined grains: brown rice, barley, bulgur, oatmeal, etc. . Healthy fats  o Avoid highly processed fats such as vegetable oil o Examples of healthy fats: avocado, olives, virgin olive oil, dark chocolate (?72% Cocoa), nuts (peanuts, almonds, walnuts, cashews, pecans,  etc.) . None to Low Intake of Animal Sources of Protein o Meat sources: chicken, [place], salmon, tuna. Limit to 4 ounces of meat at one time. o Consider limiting dairy sources, but when choosing dairy focus on: PLAIN [place] yogurt, cottage cheese, high-protein milk . Fruit o Choose berries  When to Eat . Intermittent Fasting: o Choosing not to eat for a specific time period, but DO FOCUS ON HYDRATION when fasting o Multiple Techniques: - Time Restricted Eating: eat 3 meals in a day, each meal lasting no more than 60 minutes, no snacks between meals - 16-18 hour fast: fast for 16 to 18 hours up to 7 days a week. Often suggested to start with 2-3 nonconsecutive days per week.  . Remember the time you sleep is counted as fasting.  . Examples of eating schedule: Fast from 7:00pm-11:00am. Eat between 11:00am-7:00pm.  - 24-hour fast: fast for 24 hours up to every other day. Often suggested to start with 1 day per week . Remember the time you sleep is counted as fasting . Examples of eating schedule:  o Eating day: eat 2-3 meals on your eating day. If doing 2 meals, each meal should last no more than 90 minutes. If doing 3 meals, each meal should last no more than 60 minutes. Finish last meal by 7:00pm. o Fasting day: Fast until 7:00pm.  o IF YOU FEEL UNWELL FOR ANY REASON/IN ANY WAY WHEN FASTING, STOP FASTING BY EATING A NUTRITIOUS SNACK OR LIGHT MEAL o ALWAYS FOCUS ON HYDRATION DURING FASTS - Acceptable Hydration sources: water, broths, tea/coffee (black tea/coffee is best but using a small amount of whole-fat dairy products in coffee/tea is acceptable).  - Poor Hydration Sources: anything with sugar or artificial sweeteners added to it  These recommendations have been developed for patients that are actively receiving medical care from either Dr. Anastasio Champion or Jeralyn Ruths, DNP, NP-C at [facility]. These recommendations are developed for patients with specific medical conditions and are not  meant to be distributed or used by others that are not actively receiving care from either provider listed above at [facility]. It is not appropriate to participate in the above eating plans without proper medical supervision.   Reference: Rexanne Mano. The obesity code. [place]; 2016.

## 2019-05-27 NOTE — Assessment & Plan Note (Signed)
I told him to take 10,000 IUs of vitamin D3 daily.  Need to collect serum level at next office visit.

## 2019-05-27 NOTE — Assessment & Plan Note (Signed)
He was encouraged to try to lose weight.  We did discuss dietary changes.  I encouraged him to try a plant-based diet as well as to continue trying intermittent fasting.  He was given a handout regarding this information as well.

## 2019-05-27 NOTE — Assessment & Plan Note (Signed)
Continue medications as prescribed.

## 2019-05-27 NOTE — Assessment & Plan Note (Signed)
Reviewing his symptoms he did mention that he does seem to have claudication with walking.  I did offer to refer him back to vascular surgeon for further evaluation.  He tells me he would like to hold off on this for now.

## 2019-05-27 NOTE — Telephone Encounter (Signed)
Patient is scheduled to see Tommye Standard, PA, on 05/29/19.

## 2019-05-27 NOTE — Assessment & Plan Note (Signed)
Kidney disease appears stable at this time.  I did offer to rerefer him to nephrology for further assistance with management.  He tells me he like to hold off on this for now.  In the meantime to continue taking his medication as prescribed.  Consider checking urine for albumin again at next office visit.

## 2019-05-27 NOTE — Progress Notes (Addendum)
Subjective:  Patient ID: [name], male    DOB: [birth year]  Age: 74 y.o. MRN: [id number]  CC:  Chief Complaint  Patient presents with  . Follow-up      HPI  This patient comes in today for follow-up regarding his vitamin D deficiency, type 2 diabetes, chronic kidney disease, and hypertension.  He also mentions he had an episode of chest pain earlier today.  He has a history of vitamin D deficiency.  Last serum level was collected in September 2020 and it was 58.  At that time he was taking 5000 IUs of vitamin D3.  It looks like he may have been told to start taking 10,000 international units of vitamin D3, however he tells me he did not do that and continues on 5000 IUs of vitamin D3.  He also has a history of type 2 diabetes.  Last A1c was 6.8 in September 2020.  He continues on glipizide and Trulicity.  He tells me is tolerating these well.  He is on statin but is not on ACE or ARB due to allergy.  He has a history of chronic kidney disease and is positive for albuminuria.  He has seen a nephrologist in the past, but is tell me he has not followed with one regularly at this time.  Last estimated GFR was around 36.    He also mentioned that he had a episode of chest pain when he first woke up this morning last about 5 minutes.  He tells me it was across the middle of his chest and he is concerned that it is related to his pacemaker.  He tells me that the battery is starting to wear out and he has an appointment later this week to have that replaced or at least to discuss having it replaced.  He tells me he did not need any nitroglycerin to treat the pain.  He does report the pain is sharp.  He says that it went away on its own.  Past Medical History:  Diagnosis Date  . AAA (abdominal aortic aneurysm) ([place])    Followed by Dr. Donnetta Hutching  . AICD (automatic cardioverter/defibrillator) present   . Angioedema    Secondary to ACE inhibitor  . Arthritis   . Bell palsy   .  Carotid artery disease ([place])   . CHF (congestive heart failure) ([place])   . Coronary atherosclerosis of native coronary artery    Multivessel status post CABG  . Essential hypertension, benign   . Gout   . Hernia of abdominal wall   . Hypercholesteremia   . Ischemic cardiomyopathy    LVEF 45-50% 11/2011  . Migraines   . Morbid obesity ([place]) 02/12/2019  . Myocardial infarction ([employer])   . Nephrolithiasis   . PAD (peripheral artery disease) ([place])    Followed by Dr. Donnetta Hutching  . Sleep apnea   . Type 2 diabetes mellitus ([facility])       Family History  Problem Relation Age of Onset  . Diabetes Mother        Type  I   . Varicose Veins Mother   . Heart disease Father 69       AAA  . AAA (abdominal aortic aneurysm) Father   . Hyperlipidemia Father   . 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   Social History   Tobacco Use  . Smoking status: Former  Smoker    Packs/day: 1.00    Years: 25.00    Pack years: 25.00    Types: Cigarettes    Start date: 02/28/1979    Quit date: 05/23/2007    Years since quitting: 12.0  . Smokeless tobacco: Never Used  Substance Use Topics  . Alcohol use: No    Alcohol/week: 0.0 standard drinks     Current Meds  Medication Sig  . acetaminophen (TYLENOL) 500 MG tablet Take 1,000 mg by mouth daily as needed for mild pain, moderate pain or headache (pain).   Marland Kitchen albuterol (PROVENTIL) (2.5 MG/3ML) 0.083% nebulizer solution Take 3 mLs (2.5 mg total) by nebulization every 6 (six) hours as needed for wheezing or shortness of breath.  . allopurinol (ZYLOPRIM) 100 MG tablet TAKE 1 TABLET BY MOUTH ONCE DAILY. (Patient taking differently: Take 100 mg by mouth daily. )  . amLODipine (NORVASC) 5 MG tablet TAKE 1 TABLET ONCE DAILY. (Patient taking differently: Take 5 mg by mouth daily. )  . aspirin EC 81 MG tablet Take 1 tablet (81 mg total) by mouth daily with breakfast.  .  budesonide-formoterol (SYMBICORT) 160-4.5 MCG/ACT inhaler Inhale 2 puffs into the lungs 2 (two) times daily.  . carvedilol (COREG) 25 MG tablet TAKE (1) TABLET TWICE DAILY. (Patient taking differently: Take 25 mg by mouth 2 (two) times daily with a meal. )  . Cholecalciferol (VITAMIN D-3) 125 MCG (5000 UT) TABS Take 2 tablets by mouth daily.  Marland Kitchen docusate sodium (COLACE) 100 MG capsule Take 1 capsule (100 mg total) by mouth every 12 (twelve) hours.  . furosemide (LASIX) 80 MG tablet TAKE (1) TABLET TWICE DAILY. (Patient taking differently: Take 80 mg by mouth 2 (two) times daily. )  . gabapentin (NEURONTIN) 300 MG capsule TAKE 1 CAPSULE BY MOUTH TWICE DAILY. (Patient taking differently: Take 300 mg by mouth 2 (two) times daily. )  . glipiZIDE (GLUCOTROL) 10 MG tablet Take 1 tablet (10 mg total) by mouth 2 (two) times daily.  . isosorbide mononitrate (IMDUR) 60 MG 24 hr tablet TAKE 1 TABLET BY MOUTH 3 TIMES DAILY. (Patient taking differently: Take 60 mg by mouth 3 (three) times daily. )  . nitroGLYCERIN (NITROSTAT) 0.4 MG SL tablet PLACE 0.4 MG  UNDER THE TONGUE EVERY 5 MINUTES UP TO 3 DOSES AS NEEDED FOR CHEST PAIN.  Marland Kitchen potassium chloride (K-DUR) 10 MEQ tablet TAKE 2 TABLETS BY MOUTH ONCE DAILY. (Patient taking differently: Take 20 mEq by mouth daily. )  . simvastatin (ZOCOR) 40 MG tablet TAKE 1 TABLET ONCE DAILY.  . tamsulosin (FLOMAX) 0.4 MG CAPS capsule Take 1 capsule (0.4 mg total) by mouth daily after supper.  . TRULICITY A999333 0000000 SOPN ONE INJECTION INTO SKIN EVERY WEEK. (Patient taking differently: Inject 0.5 mLs into the skin once a week. )    ROS:  Review of Systems  Constitutional: Positive for malaise/fatigue. Negative for fever.  Eyes: Positive for blurred vision (intermittent; patient tells me he has been evaluated by eye doctor).  Respiratory: Positive for shortness of breath. Negative for cough.   Cardiovascular: Positive for chest pain.  Gastrointestinal: Negative for blood in  stool.  Musculoskeletal: Positive for back pain and myalgias.  Neurological: Negative for sensory change and weakness.     Objective:   Today's Vitals: BP (!) 142/68   Pulse 66   Temp 97.6 F (36.4 C)   Resp 14   Ht 6\' 1"  (1.854 m)   Wt 261 lb 9.6 oz (118.7 kg)  SpO2 96%   BMI 34.51 kg/m  Vitals with BMI 05/27/2019 05/10/2019 05/10/2019  Height 6\' 1"  - -  Weight 261 lbs 10 oz - -  BMI 123XX123 - -  Systolic A999333 A999333 -  Diastolic 68 71 -  Pulse 66 72 72     Physical Exam Vitals reviewed.  Constitutional:      Appearance: Normal appearance.  HENT:     Head: Normocephalic and atraumatic.  Cardiovascular:     Rate and Rhythm: Normal rate and regular rhythm.     Pulses:          Dorsalis pedis pulses are 1+ on the right side and 1+ on the left side.  Pulmonary:     Effort: Pulmonary effort is normal.     Breath sounds: Normal breath sounds.  Musculoskeletal:     Cervical back: Neck supple.     Right foot: No deformity.     Left foot: Deformity (s/p amputation of 1st digit) present.  Feet:     Right foot:     Protective Sensation: 10 sites tested. 2 sites sensed.     Skin integrity: Skin integrity normal.     Toenail Condition: Right toenails are abnormally thick.     Left foot:     Protective Sensation: 9 sites tested. 2 sites sensed.     Skin integrity: Skin integrity normal.     Toenail Condition: Left toenails are abnormally thick.  Skin:    General: Skin is warm and dry.  Neurological:     Mental Status: He is alert and oriented to person, place, and time.  Psychiatric:        Mood and Affect: Mood normal.        Behavior: Behavior normal.        Thought Content: Thought content normal.        Judgment: Judgment normal.     EKG: Appears to be in sinus rhythm and is V paced.  I do not note any ST elevation.     Assessment   No diagnosis found.    Tests ordered No orders of the defined types were placed in this encounter.    Plan: Please see  assessment and plan per problem list below.   No orders of the defined types were placed in this encounter.   Patient to follow-up in 3 months.  Ailene Ards, NP

## 2019-05-27 NOTE — Assessment & Plan Note (Signed)
He has an extensive history of cardiac disease.  I told him that if he were to have any more episodes of chest pain that he needs to proceed to the emergency department and that he should call his cardiologist upon leaving this office today to set up an outpatient appointment to be evaluated.  He tells me he understands that he will do this.

## 2019-05-27 NOTE — Assessment & Plan Note (Signed)
Last A1c at goal.  He will continue medications as prescribed.  We will need to collect A1c at next office visit.

## 2019-05-29 ENCOUNTER — Telehealth: Payer: Self-pay | Admitting: Physician Assistant

## 2019-05-29 ENCOUNTER — Encounter: Payer: Medicare Other | Admitting: Physician Assistant

## 2019-05-29 NOTE — Progress Notes (Deleted)
Cardiology Office Note Date:  05/29/2019  Patient ID:  [name], [name] 1945-07-10, MRN [id number] PCP:  Doree Albee, MD  Cardiologist:  Dr. Domenic Polite Electrophysiologist: Dr. Lovena Le  ***refresh   Chief Complaint: *** device evaluation,  ??? ERI  History of Present Illness: [name] is a 74 y.o. male with history of DM, HTN, HLD, CKD (III), AAA, PVD h/o LLE fem-pop bypass (around L SFA aneurysm), known occlusion R SFA (follows with Dr. Donnetta Hutching), Bells Palsy, CAD (CABG 2001  >> cath 2016, graft failure and no targets, (LIMA is was still patent) medical management), ICM, ICD, chronic CHF (combined), LBBB, TIA   He last saw Dr. Donnetta Hutching Aug 2019, at that visit mentioned 33min R eye blindness weeks prior to his visit, carotid US was done without findings to explain and recommended to f/u with his PMD, planned for annual visits with him for his PVD (no mention of an AAA).  He saw Dr. Domenic Polite Dec 2019, had some degree of CP, felt to be mild, planned for surveillance, and PRN s/l NTG  He comes in today to be seen for Dr. Lovena Le, last seen by him Jan 2019, was doing well, no changes were made.  He had brief hospital stay admitted 05/08/2019 to [facility] with DOE, suspect to be COPD, mild CHF exacerbation,  His HS Trop peaked 123, no c/o CP or suspected ACS.  Treated with IV solumedrol > PO prednisone.   He saw his PMD yesterday mentioning an episode of CP, described as sharp and thought was his PPM, having been informed the battery was low.    *** remote jan 4 with 1.8 years??? *** volume status *** CP *** labs, lipids *** TIA????  Follow up, any AF? *** meds, CAD, CM *** 05/19/2019 EKG       Device information SJM CRT-D implanted 6/16/2016LV moved developed Diaphragmatic stim, 02/24/2015 underwent LV and RA revision (has had stable elevated thresholds)  Past Medical History:  Diagnosis Date  . AAA (abdominal aortic aneurysm) ([place])    Followed by Dr. Donnetta Hutching  . AICD  (automatic cardioverter/defibrillator) present   . Angioedema    Secondary to ACE inhibitor  . Arthritis   . Bell palsy   . Carotid artery disease ([place])   . CHF (congestive heart failure) ([place])   . Coronary atherosclerosis of native coronary artery    Multivessel status post CABG  . Essential hypertension, benign   . Gout   . Hernia of abdominal wall   . Hypercholesteremia   . Ischemic cardiomyopathy    LVEF 45-50% 11/2011  . Migraines   . Morbid obesity ([place]) 02/12/2019  . Myocardial infarction ([place])   . Nephrolithiasis   . PAD (peripheral artery disease) ([place])    Followed by Dr. Donnetta Hutching  . Sleep apnea   . Type 2 diabetes mellitus ([place])     Past Surgical History:  Procedure Laterality Date  . ABDOMINAL AORTIC ANEURYSM REPAIR  November 19, 2013   [facility] :  Dr. Sammuel Hines  . ABDOMINAL AORTIC ANEURYSM REPAIR  09-07-2014   [facility]  . BI-VENTRICULAR IMPLANTABLE CARDIOVERTER DEFIBRILLATOR  (CRT-D)  11/05/2014  . BYPASS GRAFT POPLITEAL TO POPLITEAL Left 06/08/2014   Procedure: BYPASS GRAFT FEMORAL ARTERY TO ABOVE KNEE POPLITEAL;  Surgeon: Rosetta Posner, MD;  Location: [place];  Service: Vascular;  Laterality: Left;  . CARDIAC CATHETERIZATION N/A 10/02/2014   Procedure: Left Heart Cath and Cors/Grafts Angiography;  Surgeon: Belva Crome, MD;  Location: [place] [facility];  Service: Cardiovascular;  Laterality: N/A;  . CARDIAC CATHETERIZATION  "several"  . CATARACT EXTRACTION W/PHACO  03/23/2011   Procedure: CATARACT EXTRACTION PHACO AND INTRAOCULAR LENS PLACEMENT (IOC);  Surgeon: Tonny Branch;  [facility]: [facility];  Service: Ophthalmology;  Laterality: Left;  CDE: 15.99  . CATARACT EXTRACTION W/PHACO  04/17/2011   Procedure: CATARACT EXTRACTION PHACO AND INTRAOCULAR LENS PLACEMENT (IOC);  Surgeon: Tonny Branch;  Location: [facility];  Service: Ophthalmology;  Laterality: Right;  CDE: 23.45  . CORONARY ARTERY BYPASS GRAFT  2001   LIMA to LAD, SVG to diagonal and ramus, SVG to OM, SVG to AM  .  DUPUYTREN CONTRACTURE RELEASE Left 2009  . EP IMPLANTABLE DEVICE N/A 11/05/2014   Procedure: BiV ICD Insertion CRT-D;  Surgeon: Evans Lance, MD;  Location: [place] [facility];  Service: Cardiovascular;  Laterality: N/A;  . EP IMPLANTABLE DEVICE N/A 02/24/2015   Procedure: Lead Revision/Repair;  Surgeon: Evans Lance, MD;  Location: [place] [facility];  Service: Cardiovascular;  Laterality: N/A;  . EXTRACORPOREAL SHOCK WAVE LITHOTRIPSY  X 1  . EYE SURGERY    . FOOT SURGERY Left 1963   "gangrene"  . ICD LEAD REMOVAL  02/24/2015  . LOWER EXTREMITY ANGIOGRAM Bilateral 04/28/2015   Procedure: Lower Extremity Angiogram;  Surgeon: Rosetta Posner, MD;  Location: [place] [facility];  Service: Cardiovascular;  Laterality: Bilateral;  . PERIPHERAL VASCULAR CATHETERIZATION N/A 04/28/2015   Procedure: Abdominal Aortogram;  Surgeon: Rosetta Posner, MD;  Location: [place] [facility];  Service: Cardiovascular;  Laterality: N/A;    Current Outpatient Medications  Medication Sig Dispense Refill  . acetaminophen (TYLENOL) 500 MG tablet Take 1,000 mg by mouth daily as needed for mild pain, moderate pain or headache (pain).     Marland Kitchen albuterol (PROVENTIL) (2.5 MG/3ML) 0.083% nebulizer solution Take 3 mLs (2.5 mg total) by nebulization every 6 (six) hours as needed for wheezing or shortness of breath. 75 mL 12  . allopurinol (ZYLOPRIM) 100 MG tablet TAKE 1 TABLET BY MOUTH ONCE DAILY. (Patient taking differently: Take 100 mg by mouth daily. ) 90 tablet 0  . amLODipine (NORVASC) 5 MG tablet TAKE 1 TABLET ONCE DAILY. (Patient taking differently: Take 5 mg by mouth daily. ) 90 tablet 0  . aspirin EC 81 MG tablet Take 1 tablet (81 mg total) by mouth daily with breakfast. 30 tablet 3  . budesonide-formoterol (SYMBICORT) 160-4.5 MCG/ACT inhaler Inhale 2 puffs into the lungs 2 (two) times daily. 10.2 g 4  . carvedilol (COREG) 25 MG tablet TAKE (1) TABLET TWICE DAILY. (Patient taking differently: Take 25 mg by mouth 2 (two) times  daily with a meal. ) 60 tablet 6  . Cholecalciferol (VITAMIN D-3) 125 MCG (5000 UT) TABS Take 2 tablets by mouth daily.    Marland Kitchen docusate sodium (COLACE) 100 MG capsule Take 1 capsule (100 mg total) by mouth every 12 (twelve) hours. 60 capsule 0  . furosemide (LASIX) 80 MG tablet TAKE (1) TABLET TWICE DAILY. (Patient taking differently: Take 80 mg by mouth 2 (two) times daily. ) 180 tablet 3  . gabapentin (NEURONTIN) 300 MG capsule TAKE 1 CAPSULE BY MOUTH TWICE DAILY. (Patient taking differently: Take 300 mg by mouth 2 (two) times daily. ) 60 capsule 3  . glipiZIDE (GLUCOTROL) 10 MG tablet Take 1 tablet (10 mg total) by mouth 2 (two) times daily. 180 tablet 1  . isosorbide mononitrate (IMDUR) 60 MG 24 hr tablet TAKE 1  TABLET BY MOUTH 3 TIMES DAILY. (Patient taking differently: Take 60 mg by mouth 3 (three) times daily. ) 90 tablet 3  . nitroGLYCERIN (NITROSTAT) 0.4 MG SL tablet PLACE 0.4 MG  UNDER THE TONGUE EVERY 5 MINUTES UP TO 3 DOSES AS NEEDED FOR CHEST PAIN. 25 tablet 3  . potassium chloride (K-DUR) 10 MEQ tablet TAKE 2 TABLETS BY MOUTH ONCE DAILY. (Patient taking differently: Take 20 mEq by mouth daily. ) 180 tablet 3  . simvastatin (ZOCOR) 40 MG tablet TAKE 1 TABLET ONCE DAILY. 30 tablet 0  . tamsulosin (FLOMAX) 0.4 MG CAPS capsule Take 1 capsule (0.4 mg total) by mouth daily after supper. 30 capsule 0  . TRULICITY A999333 0000000 SOPN ONE INJECTION INTO SKIN EVERY WEEK. (Patient taking differently: Inject 0.5 mLs into the skin once a week. ) 2 mL 0   No current facility-administered medications for this visit.    Allergies:   Ace inhibitors, Codeine, and Hydromorphone   Social History:  The patient  reports that he quit smoking about 12 years ago. His smoking use included cigarettes. He started smoking about 40 years ago. He has a 25.00 pack-year smoking history. He has never used smokeless tobacco. He reports that he does not drink alcohol or use drugs.   Family History:  The patient's family  history includes AAA (abdominal aortic aneurysm) in his father; Diabetes in his mother; Heart disease (age of onset: 19) in his father; Hyperlipidemia in his father; Hypertension in his father; Varicose Veins in his mother.  ROS:  Please see the history of present illness.    All other systems are reviewed and otherwise negative.   PHYSICAL EXAM: *** VS:  There were no vitals taken for this visit. BMI: There is no height or weight on file to calculate BMI. Well nourished, well developed, in no acute distress  HEENT: normocephalic, atraumatic  Neck: no JVD, carotid bruits or masses Cardiac:  *** RRR; no significant murmurs, no rubs, or gallops Lungs:  *** CTA b/l, no wheezing, rhonchi or rales  Abd: soft, nontender MS: no deformity or *** atrophy Ext: *** no edema  Skin: warm and dry, no rash Neuro:  No gross deficits appreciated Psych: euthymic mood, full affect  *** ICD site is stable, no tethering or discomfort   EKG:  Recent ECGs reviewed ***  ICD interrogation done today and reviewed by myself; ***   02/13/2018: TTE Study Conclusions - Left ventricle: The cavity size was normal. Wall thickness was   increased in a pattern of mild LVH. Systolic function was mildly   reduced. The estimated ejection fraction was in the range of 45%   to 50%. There is akinesis of the basalinferolateral myocardium.   Doppler parameters are consistent with abnormal left ventricular   relaxation (grade 1 diastolic dysfunction). - Aortic valve: Mildly to moderately calcified annulus. Trileaflet;   mildly calcified leaflets. - Mitral valve: Mildly calcified annulus. There was trivial   regurgitation. - Right ventricle: Pacer wire or catheter noted in right ventricle. - Right atrium: Central venous pressure (est): 3 mm Hg. - Atrial septum: No defect or patent foramen ovale was identified. - Tricuspid valve: There was trivial regurgitation. - Pericardium, extracardiac: There was no pericardial  effusion.   01/15/2018: carotid US Final Interpretation: Right Carotid: Velocities in the right ICA are consistent with a 1-39% stenosis.  Left Carotid: Velocities in the left ICA are consistent with a 1-39% stenosis.  Vertebrals:  Bilateral vertebral arteries demonstrate antegrade flow. Subclavians:  Normal flow hemodynamics were seen in bilateral subclavian              arteries.   May 2016, LHC.   Bypass graft failure with total occlusion of the saphenous vein graft to the acute marginal of the right coronary, total occlusion of the sequential saphenous vein graft to the diagonal and ramus intermedius, and total occlusion of the saphenous graft to the distal circumflex/obtuse marginal. Patent LIMA to the LAD. Total occlusion of the native LAD, the mid right coronary, second obtuse marginal, the first diagonal, and the ramus intermedius. The distal right coronary, the obtuse marginal, ramus intermedius are collateralized from the native circulation via the LIMA to LAD. Severe left ventricular systolic dysfunction with an inferobasal aneurysm, and an ejection fraction of 20-25%.    Recent Labs: 05/08/2019: ALT 29; B Natriuretic Peptide 185.0 05/09/2019: Hemoglobin 14.0; Magnesium 2.3; Platelets 310 05/10/2019: BUN 33; Creatinine, Ser 1.90; Potassium 4.2; Sodium 137  02/12/2019: Cholesterol 139; HDL 33; LDL Cholesterol (Calc) 74; Total CHOL/HDL Ratio 4.2; Triglycerides 228   Estimated Creatinine Clearance: 48.1 mL/min (A) (by C-G formula based on SCr of 1.9 mg/dL (H)).   Wt Readings from Last 3 Encounters:  05/27/19 261 lb 9.6 oz (118.7 kg)  05/10/19 256 lb 9.9 oz (116.4 kg)  02/12/19 252 lb 3.2 oz (114.4 kg)     Other studies reviewed: Additional studies/records reviewed today include: summarized above  ASSESSMENT AND PLAN:  1. CRT- ICD     ***  2. CAD (severe)     Cath 2016, noted above, medical management     ***      ***  3. ICM 4. Chronic CHF (combined     Last  LVEF 2019, 45-50%     ***  5.       Disposition: F/u with ***  Current medicines are reviewed at length with the patient today.  The patient did not have any concerns regarding medicines.***  Signed, Tommye Standard, PA-C 05/29/2019 5:43 AM     [facility] [address] [place] [address] [phone] (office)  [phone] (fax)

## 2019-05-29 NOTE — Telephone Encounter (Signed)
New message   Patient would like to know if their 1:00 pm appt can be turned into virtual visit today? Please advise.

## 2019-06-04 ENCOUNTER — Ambulatory Visit (INDEPENDENT_AMBULATORY_CARE_PROVIDER_SITE_OTHER): Payer: Medicare HMO | Admitting: Internal Medicine

## 2019-06-04 ENCOUNTER — Encounter: Payer: Self-pay | Admitting: Internal Medicine

## 2019-06-04 VITALS — BP 138/65 | HR 74 | Temp 98.6°F | Ht 73.0 in | Wt 259.0 lb

## 2019-06-04 DIAGNOSIS — I255 Ischemic cardiomyopathy: Secondary | ICD-10-CM

## 2019-06-04 DIAGNOSIS — I5022 Chronic systolic (congestive) heart failure: Secondary | ICD-10-CM | POA: Diagnosis not present

## 2019-06-04 DIAGNOSIS — Z95810 Presence of automatic (implantable) cardiac defibrillator: Secondary | ICD-10-CM

## 2019-06-04 LAB — CUP PACEART INCLINIC DEVICE CHECK
Battery Remaining Longevity: 21 mo
Brady Statistic RA Percent Paced: 38 %
Brady Statistic RV Percent Paced: 94 %
Date Time Interrogation Session: 20210113122919
HighPow Impedance: 76.5 Ohm
Implantable Lead Implant Date: 20160616
Implantable Lead Implant Date: 20161005
Implantable Lead Implant Date: 20161005
Implantable Lead Location: 753858
Implantable Lead Location: 753859
Implantable Lead Location: 753860
Implantable Lead Model: 7122
Implantable Pulse Generator Implant Date: 20160616
Lead Channel Impedance Value: 437.5 Ohm
Lead Channel Impedance Value: 537.5 Ohm
Lead Channel Impedance Value: 825 Ohm
Lead Channel Pacing Threshold Amplitude: 0.75 V
Lead Channel Pacing Threshold Amplitude: 1 V
Lead Channel Pacing Threshold Amplitude: 2 V
Lead Channel Pacing Threshold Pulse Width: 0.5 ms
Lead Channel Pacing Threshold Pulse Width: 0.5 ms
Lead Channel Pacing Threshold Pulse Width: 1 ms
Lead Channel Sensing Intrinsic Amplitude: 11.4 mV
Lead Channel Sensing Intrinsic Amplitude: 2.3 mV
Lead Channel Setting Pacing Amplitude: 2 V
Lead Channel Setting Pacing Amplitude: 2 V
Lead Channel Setting Pacing Amplitude: 3 V
Lead Channel Setting Pacing Pulse Width: 0.5 ms
Lead Channel Setting Pacing Pulse Width: 1 ms
Lead Channel Setting Sensing Sensitivity: 0.5 mV
Pulse Gen Serial Number: 7288352

## 2019-06-04 NOTE — Patient Instructions (Signed)
Medication Instructions:  Your physician recommends that you continue on your current medications as directed. Please refer to the Current Medication list given to you today.  *If you need a refill on your cardiac medications before your next appointment, please call your pharmacy*  Lab Work: NONE  If you have labs (blood work) drawn today and your tests are completely normal, you will receive your results only by: . MyChart Message (if you have MyChart) OR . A paper copy in the mail If you have any lab test that is abnormal or we need to change your treatment, we will call you to review the results.  Testing/Procedures: NONE   Follow-Up: At [facility], you and your health needs are our priority.  As part of our continuing mission to provide you with exceptional heart care, we have created designated Provider Care Teams.  These Care Teams include your primary Cardiologist (physician) and Advanced Practice Providers (APPs -  Physician Assistants and Nurse Practitioners) who all work together to provide you with the care you need, when you need it.  Your next appointment:   1 year(s)  The format for your next appointment:   In Person  Provider:   Gregg Taylor, MD  Other Instructions Thank you for choosing [facility]!    

## 2019-06-04 NOTE — Progress Notes (Signed)
HPI [name] returns today for ongoing evaluation of chronic systolic heart failure s/p biv ICD insertion. He was hospitalized back in December with CHF. He feels better. He has been bothered by weight gain. He is up 30 lbs. He denies dietary indiscretion.  Allergies  Allergen Reactions  . Ace Inhibitors Swelling    Throat and lips swelled; ended up in ED  . Codeine Other (See Comments)     Delirium, hallucinations  . Hydromorphone Nausea And Vomiting     Current Outpatient Medications  Medication Sig Dispense Refill  . acetaminophen (TYLENOL) 500 MG tablet Take 1,000 mg by mouth daily as needed for mild pain, moderate pain or headache (pain).     Marland Kitchen albuterol (PROVENTIL) (2.5 MG/3ML) 0.083% nebulizer solution Take 3 mLs (2.5 mg total) by nebulization every 6 (six) hours as needed for wheezing or shortness of breath. 75 mL 12  . allopurinol (ZYLOPRIM) 100 MG tablet TAKE 1 TABLET BY MOUTH ONCE DAILY. (Patient taking differently: Take 100 mg by mouth daily. ) 90 tablet 0  . amLODipine (NORVASC) 5 MG tablet TAKE 1 TABLET ONCE DAILY. (Patient taking differently: Take 5 mg by mouth daily. ) 90 tablet 0  . aspirin EC 81 MG tablet Take 1 tablet (81 mg total) by mouth daily with breakfast. 30 tablet 3  . budesonide-formoterol (SYMBICORT) 160-4.5 MCG/ACT inhaler Inhale 2 puffs into the lungs 2 (two) times daily. 10.2 g 4  . carvedilol (COREG) 25 MG tablet TAKE (1) TABLET TWICE DAILY. (Patient taking differently: Take 25 mg by mouth 2 (two) times daily with a meal. ) 60 tablet 6  . Cholecalciferol (VITAMIN D-3) 125 MCG (5000 UT) TABS Take 2 tablets by mouth daily.    Marland Kitchen docusate sodium (COLACE) 100 MG capsule Take 1 capsule (100 mg total) by mouth every 12 (twelve) hours. 60 capsule 0  . furosemide (LASIX) 80 MG tablet TAKE (1) TABLET TWICE DAILY. (Patient taking differently: Take 80 mg by mouth 2 (two) times daily. ) 180 tablet 3  . gabapentin (NEURONTIN) 300 MG capsule TAKE 1 CAPSULE BY  MOUTH TWICE DAILY. (Patient taking differently: Take 300 mg by mouth 2 (two) times daily. ) 60 capsule 3  . glipiZIDE (GLUCOTROL) 10 MG tablet Take 1 tablet (10 mg total) by mouth 2 (two) times daily. 180 tablet 1  . isosorbide mononitrate (IMDUR) 60 MG 24 hr tablet TAKE 1 TABLET BY MOUTH 3 TIMES DAILY. (Patient taking differently: Take 60 mg by mouth 3 (three) times daily. ) 90 tablet 3  . nitroGLYCERIN (NITROSTAT) 0.4 MG SL tablet PLACE 0.4 MG  UNDER THE TONGUE EVERY 5 MINUTES UP TO 3 DOSES AS NEEDED FOR CHEST PAIN. 25 tablet 3  . potassium chloride (K-DUR) 10 MEQ tablet TAKE 2 TABLETS BY MOUTH ONCE DAILY. (Patient taking differently: Take 20 mEq by mouth daily. ) 180 tablet 3  . simvastatin (ZOCOR) 40 MG tablet TAKE 1 TABLET ONCE DAILY. 30 tablet 0  . tamsulosin (FLOMAX) 0.4 MG CAPS capsule Take 1 capsule (0.4 mg total) by mouth daily after supper. 30 capsule 0  . TRULICITY A999333 0000000 SOPN ONE INJECTION INTO SKIN EVERY WEEK. (Patient taking differently: Inject 0.5 mLs into the skin once a week. ) 2 mL 0   No current facility-administered medications for this visit.     Past Medical History:  Diagnosis Date  . AAA (abdominal aortic aneurysm) ([place])    Followed by Dr. Donnetta Hutching  . AICD (automatic cardioverter/defibrillator) present   .  Angioedema    Secondary to ACE inhibitor  . Arthritis   . Bell palsy   . Carotid artery disease ([place])   . CHF (congestive heart failure) ([place])   . Coronary atherosclerosis of native coronary artery    Multivessel status post CABG  . Essential hypertension, benign   . Gout   . Hernia of abdominal wall   . Hypercholesteremia   . Ischemic cardiomyopathy    LVEF 45-50% 11/2011  . Migraines   . Morbid obesity ([place]) 02/12/2019  . Myocardial infarction ([place])   . Nephrolithiasis   . PAD (peripheral artery disease) ([place])    Followed by Dr. Donnetta Hutching  . Sleep apnea   . Type 2 diabetes mellitus ([facility])     ROS:   All systems reviewed and negative except as  noted in the HPI.   Past Surgical History:  Procedure Laterality Date  . ABDOMINAL AORTIC ANEURYSM REPAIR  November 19, 2013   [facility] :  Dr. Sammuel Hines  . ABDOMINAL AORTIC ANEURYSM REPAIR  09-07-2014   [facility]  . BI-VENTRICULAR IMPLANTABLE CARDIOVERTER DEFIBRILLATOR  (CRT-D)  11/05/2014  . BYPASS GRAFT POPLITEAL TO POPLITEAL Left 06/08/2014   Procedure: BYPASS GRAFT FEMORAL ARTERY TO ABOVE KNEE POPLITEAL;  Surgeon: Rosetta Posner, MD;  Location: [place];  Service: Vascular;  Laterality: Left;  . CARDIAC CATHETERIZATION N/A 10/02/2014   Procedure: Left Heart Cath and Cors/Grafts Angiography;  Surgeon: Belva Crome, MD;  Location: [place] [facility];  Service: Cardiovascular;  Laterality: N/A;  . CARDIAC CATHETERIZATION  "several"  . CATARACT EXTRACTION W/PHACO  03/23/2011   Procedure: CATARACT EXTRACTION PHACO AND INTRAOCULAR LENS PLACEMENT (IOC);  Surgeon: Tonny Branch;  Location: [facility];  Service: Ophthalmology;  Laterality: Left;  CDE: 15.99  . CATARACT EXTRACTION W/PHACO  04/17/2011   Procedure: CATARACT EXTRACTION PHACO AND INTRAOCULAR LENS PLACEMENT (IOC);  Surgeon: Tonny Branch;  [facility]: [facility];  Service: Ophthalmology;  Laterality: Right;  CDE: 23.45  . CORONARY ARTERY BYPASS GRAFT  2001   LIMA to LAD, SVG to diagonal and ramus, SVG to OM, SVG to AM  . DUPUYTREN CONTRACTURE RELEASE Left 2009  . EP IMPLANTABLE DEVICE N/A 11/05/2014   Procedure: BiV ICD Insertion CRT-D;  Surgeon: Evans Lance, MD;  Location: [place] [facility];  Service: Cardiovascular;  Laterality: N/A;  . EP IMPLANTABLE DEVICE N/A 02/24/2015   Procedure: Lead Revision/Repair;  Surgeon: Evans Lance, MD;  Location: [place] [facility];  Service: Cardiovascular;  Laterality: N/A;  . EXTRACORPOREAL SHOCK WAVE LITHOTRIPSY  X 1  . EYE SURGERY    . FOOT SURGERY Left 1963   "gangrene"  . ICD LEAD REMOVAL  02/24/2015  . LOWER EXTREMITY ANGIOGRAM Bilateral 04/28/2015   Procedure: Lower Extremity Angiogram;  Surgeon: Rosetta Posner, MD;  Location: [place] [facility];  Service: Cardiovascular;  Laterality: Bilateral;  . PERIPHERAL VASCULAR CATHETERIZATION N/A 04/28/2015   Procedure: Abdominal Aortogram;  Surgeon: Rosetta Posner, MD;  Location: [place] [facility];  Service: Cardiovascular;  Laterality: N/A;     Family History  Problem Relation Age of Onset  . Diabetes Mother        Type  I   . Varicose Veins Mother   . Heart disease Father 57       AAA  . AAA (abdominal aortic aneurysm) Father   . Hyperlipidemia Father   . Hypertension Father      Social History   Socioeconomic History  .  Marital status: Divorced    Spouse name: Not on file  . Number of children: Not on file  . Years of education: Not on file  . Highest education level: Not on file  Occupational History  . Not on file  Tobacco Use  . Smoking status: Former Smoker    Packs/day: 1.00    Years: 25.00    Pack years: 25.00    Types: Cigarettes    Start date: 02/28/1979    Quit date: 05/23/2007    Years since quitting: 12.0  . Smokeless tobacco: Never Used  Substance and Sexual Activity  . Alcohol use: No    Alcohol/week: 0.0 standard drinks  . Drug use: No  . Sexual activity: Never  Other Topics Concern  . 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   Social Determinants of Health   Financial Resource Strain:   . Difficulty of Paying Living Expenses: Not on file  Food Insecurity:   . Worried About Charity fundraiser in the Last Year: Not on file  . Ran Out of Food in the Last Year: Not on file  Transportation Needs:   . Lack of Transportation (Medical): Not on file  . Lack of Transportation (Non-Medical): Not on file  Physical Activity:   . Days of Exercise per Week: Not on file  . Minutes of Exercise per Session: Not on file  Stress:   . Feeling of Stress : Not on file  Social Connections:   . Frequency of Communication with Friends and Family: Not on file  .  Frequency of Social Gatherings with Friends and Family: Not on file  . Attends Religious Services: Not on file  . Active Member of Clubs or Organizations: Not on file  . Attends Archivist Meetings: Not on file  . Marital Status: Not on file  Intimate Partner Violence:   . Fear of Current or Ex-Partner: Not on file  . Emotionally Abused: Not on file  . Physically Abused: Not on file  . Sexually Abused: Not on file     BP 138/65   Pulse 74   Temp 98.6 F (37 C)   Ht 6\' 1"  (1.854 m)   Wt 259 lb (117.5 kg)   SpO2 97%   BMI 34.17 kg/m   Physical Exam:  Well appearing 74 yo man, NAD HEENT: Unremarkable Neck:  No JVD, no thyromegally Lymphatics:  No adenopathy Back:  No CVA tenderness Lungs:  Clear with no wheezes HEART:  Regular rate rhythm, no murmurs, no rubs, no clicks Abd:  soft, positive bowel sounds, no organomegally, no rebound, no guarding Ext:  2 plus pulses, no edema, no cyanosis, no clubbing Skin:  No rashes no nodules Neuro:  CN II through XII intact, motor grossly intact  DEVICE  Normal device function.  See PaceArt for details.   Assess/Plan: 1. Chronic systolic heart failure - His fluid index is good and he is not volume overloaded today. I have asked him to reduce his sodium intake and to lose weight. 2. ICD -his [employer] biv ICD is working normally. He has just less than 2 years of battery longevity. 3. Obesity - I have encouraged the patient to eat less and burn more calories. He states he is going to try the go lo diet. 4. CAD - he denies anginal symptoms. No change in his meds.  [name].D.

## 2019-06-19 ENCOUNTER — Telehealth (INDEPENDENT_AMBULATORY_CARE_PROVIDER_SITE_OTHER): Payer: Self-pay

## 2019-06-24 ENCOUNTER — Other Ambulatory Visit (INDEPENDENT_AMBULATORY_CARE_PROVIDER_SITE_OTHER): Payer: Self-pay | Admitting: Internal Medicine

## 2019-06-24 ENCOUNTER — Other Ambulatory Visit: Payer: Self-pay | Admitting: Cardiology

## 2019-06-24 DIAGNOSIS — E118 Type 2 diabetes mellitus with unspecified complications: Secondary | ICD-10-CM

## 2019-07-01 NOTE — Telephone Encounter (Signed)
WAITING FOR [facility] SLEEP STUDY PAPER COME BACK.

## 2019-07-15 DIAGNOSIS — E0800 Diabetes mellitus due to underlying condition with hyperosmolarity without nonketotic hyperglycemic-hyperosmolar coma (NKHHC): Secondary | ICD-10-CM | POA: Diagnosis not present

## 2019-07-25 ENCOUNTER — Other Ambulatory Visit: Payer: Self-pay | Admitting: Cardiology

## 2019-07-25 ENCOUNTER — Other Ambulatory Visit (INDEPENDENT_AMBULATORY_CARE_PROVIDER_SITE_OTHER): Payer: Self-pay | Admitting: Internal Medicine

## 2019-07-26 ENCOUNTER — Other Ambulatory Visit (INDEPENDENT_AMBULATORY_CARE_PROVIDER_SITE_OTHER): Payer: Self-pay | Admitting: Internal Medicine

## 2019-08-14 DIAGNOSIS — E119 Type 2 diabetes mellitus without complications: Secondary | ICD-10-CM | POA: Diagnosis not present

## 2019-08-14 DIAGNOSIS — E0800 Diabetes mellitus due to underlying condition with hyperosmolarity without nonketotic hyperglycemic-hyperosmolar coma (NKHHC): Secondary | ICD-10-CM | POA: Diagnosis not present

## 2019-08-21 ENCOUNTER — Telehealth (INDEPENDENT_AMBULATORY_CARE_PROVIDER_SITE_OTHER): Payer: Self-pay

## 2019-08-21 NOTE — Telephone Encounter (Signed)
They need to you to call to give verbal order to Respitory helath to set cpap and discuss patient care need to setup to sentd out to patient.

## 2019-08-21 NOTE — Telephone Encounter (Signed)
Thank you :)

## 2019-08-21 NOTE — Telephone Encounter (Signed)
I spoke with the [employer] representative and told him that I was not comfortable giving settings for CPAP as this is not in my field of practice.  He appreciated this and will contact the patient to advise him to seek a visit with pulmonology.

## 2019-08-25 ENCOUNTER — Other Ambulatory Visit (INDEPENDENT_AMBULATORY_CARE_PROVIDER_SITE_OTHER): Payer: Self-pay | Admitting: Internal Medicine

## 2019-08-25 ENCOUNTER — Ambulatory Visit (INDEPENDENT_AMBULATORY_CARE_PROVIDER_SITE_OTHER): Payer: Medicare HMO | Admitting: *Deleted

## 2019-08-25 DIAGNOSIS — I255 Ischemic cardiomyopathy: Secondary | ICD-10-CM

## 2019-08-25 LAB — CUP PACEART REMOTE DEVICE CHECK
Battery Remaining Longevity: 19 mo
Battery Remaining Percentage: 29 %
Battery Voltage: 2.87 V
Brady Statistic AP VP Percent: 15 %
Brady Statistic AP VS Percent: 1 %
Brady Statistic AS VP Percent: 81 %
Brady Statistic AS VS Percent: 2.2 %
Brady Statistic RA Percent Paced: 12 %
Date Time Interrogation Session: 20210405022201
HighPow Impedance: 71 Ohm
HighPow Impedance: 71 Ohm
Implantable Lead Implant Date: 20160616
Implantable Lead Implant Date: 20161005
Implantable Lead Implant Date: 20161005
Implantable Lead Location: 753858
Implantable Lead Location: 753859
Implantable Lead Location: 753860
Implantable Lead Model: 7122
Implantable Pulse Generator Implant Date: 20160616
Lead Channel Impedance Value: 400 Ohm
Lead Channel Impedance Value: 540 Ohm
Lead Channel Impedance Value: 790 Ohm
Lead Channel Pacing Threshold Amplitude: 0.625 V
Lead Channel Pacing Threshold Amplitude: 1.125 V
Lead Channel Pacing Threshold Amplitude: 2.25 V
Lead Channel Pacing Threshold Pulse Width: 0.5 ms
Lead Channel Pacing Threshold Pulse Width: 0.5 ms
Lead Channel Pacing Threshold Pulse Width: 1 ms
Lead Channel Sensing Intrinsic Amplitude: 1.6 mV
Lead Channel Sensing Intrinsic Amplitude: 7 mV
Lead Channel Setting Pacing Amplitude: 2 V
Lead Channel Setting Pacing Amplitude: 2.125
Lead Channel Setting Pacing Amplitude: 3.25 V
Lead Channel Setting Pacing Pulse Width: 0.5 ms
Lead Channel Setting Pacing Pulse Width: 1 ms
Lead Channel Setting Sensing Sensitivity: 0.5 mV
Pulse Gen Serial Number: 7288352

## 2019-08-26 NOTE — Progress Notes (Signed)
ICD Remote  

## 2019-09-01 ENCOUNTER — Ambulatory Visit (INDEPENDENT_AMBULATORY_CARE_PROVIDER_SITE_OTHER): Payer: Medicare HMO | Admitting: Nurse Practitioner

## 2019-09-01 VITALS — BP 137/69 | HR 65 | Ht 73.0 in | Wt 249.0 lb

## 2019-09-01 DIAGNOSIS — E118 Type 2 diabetes mellitus with unspecified complications: Secondary | ICD-10-CM

## 2019-09-01 DIAGNOSIS — N183 Chronic kidney disease, stage 3 unspecified: Secondary | ICD-10-CM

## 2019-09-01 DIAGNOSIS — E559 Vitamin D deficiency, unspecified: Secondary | ICD-10-CM

## 2019-09-01 DIAGNOSIS — E782 Mixed hyperlipidemia: Secondary | ICD-10-CM

## 2019-09-01 DIAGNOSIS — R5383 Other fatigue: Secondary | ICD-10-CM

## 2019-09-01 DIAGNOSIS — I129 Hypertensive chronic kidney disease with stage 1 through stage 4 chronic kidney disease, or unspecified chronic kidney disease: Secondary | ICD-10-CM | POA: Diagnosis not present

## 2019-09-01 DIAGNOSIS — E1122 Type 2 diabetes mellitus with diabetic chronic kidney disease: Secondary | ICD-10-CM

## 2019-09-01 DIAGNOSIS — I10 Essential (primary) hypertension: Secondary | ICD-10-CM

## 2019-09-01 MED ORDER — TRULICITY 1.5 MG/0.5ML ~~LOC~~ SOAJ: 1.5000 mg | SUBCUTANEOUS | 2 refills | Status: DC

## 2019-09-01 NOTE — Progress Notes (Signed)
 Due to national recommendations of social distancing related to the COVID19 pandemic, an audio/visual tele-health visit was felt to be the most appropriate encounter type for this patient today. I connected with  [name] on 09/01/19 utilizing audio-only technology and verified that I am speaking with the correct person using two identifiers. The patient was located at their home, and I was located at the office of [facility] during the encounter. I discussed the limitations of evaluation and management by telemedicine. The patient expressed understanding and agreed to proceed.  Video was attempted but did not work so telephone only visit was conducted.     Subjective:  Patient ID: [name], male    DOB: 01/21/1946  Age: 73 y.o. MRN: [id number]  CC:  Chief Complaint  Patient presents with  . Diabetes  . Other    CKD, fatigue, vitamin D deficiency  . Hypertension  . Hyperlipidemia      HPI  This patient has a virtual visit for the above.  Diabetes: He has history of type 2 diabetes.  Currently he is on glipizide 10 mg twice a day as well as Trulicity 0.75 mg weekly.  Last A1c was collected in September 2020 and it was 6.8.  He tells me that his blood sugar has been running higher at home no recent past.  He tells me his memory between 170-200.  This is very concerning to him.  Chronic kidney disease: He also has a history of chronic kidney disease, and tells me he has not seen his nephrologist in nearly a year.  Last EGFR was approximately 36 and he did have albuminuria in his kidneys.  Fatigue: He is complaining of worsening fatigue since he was seen last in the office.  He denies any chest pain, difficulty breathing, dizziness, headaches, vision changes, blood in stool, easy bruising, unintentional weight loss.  Hypertension: He continues on his amlodipine, Lasix, and Imdur.  He tells me at home his blood pressure has been stable and is generally in  the 130s/70-80s  Hyperlipidemia: He does have history of elevated cholesterol.  He continues on omega-3, simvastatin, and aspirin.  Last LDL was 74 back in September 2020.  Vitamin D deficiency: He continues on 10,000 IUs of vitamin D3 daily.  Last blood draw was collected in January 2021 and it was 29.   Past Medical History:  Diagnosis Date  . AAA (abdominal aortic aneurysm) (HCC)    Followed by Dr. Early  . AICD (automatic cardioverter/defibrillator) present   . Angioedema    Secondary to ACE inhibitor  . Arthritis   . Bell palsy   . Carotid artery disease (HCC)   . CHF (congestive heart failure) (HCC)   . Coronary atherosclerosis of native coronary artery    Multivessel status post CABG  . Essential hypertension, benign   . Gout   . Hernia of abdominal wall   . Hypercholesteremia   . Ischemic cardiomyopathy    LVEF 45-50% 11/2011  . Migraines   . Morbid obesity (HCC) 02/12/2019  . Myocardial infarction (HCC)   . Nephrolithiasis   . PAD (peripheral artery disease) (HCC)    Followed by Dr. Early  . Sleep apnea   . Type 2 diabetes mellitus (HCC)       Family History  Problem Relation Age of Onset  . Diabetes Mother        Type  I   . Varicose Veins Mother   . Heart disease Father 58         AAA  . AAA (abdominal aortic aneurysm) Father   . Hyperlipidemia Father   . Hypertension Father     Social History   Social History Narrative   Divorced for 30 years.Lives alone.Retired truck driver.   Social History   Tobacco Use  . Smoking status: Former Smoker    Packs/day: 1.00    Years: 25.00    Pack years: 25.00    Types: Cigarettes    Start date: 02/28/1979    Quit date: 05/23/2007    Years since quitting: 12.2  . Smokeless tobacco: Never Used  Substance Use Topics  . Alcohol use: No    Alcohol/week: 0.0 standard drinks     Current Meds  Medication Sig  . acetaminophen (TYLENOL) 500 MG tablet Take 1,000 mg by mouth daily as needed for mild pain, moderate  pain or headache (pain).   . albuterol (PROVENTIL) (2.5 MG/3ML) 0.083% nebulizer solution Take 3 mLs (2.5 mg total) by nebulization every 6 (six) hours as needed for wheezing or shortness of breath.  . allopurinol (ZYLOPRIM) 100 MG tablet TAKE 1 TABLET BY MOUTH ONCE DAILY.  . amLODipine (NORVASC) 5 MG tablet Take 1 tablet (5 mg total) by mouth daily.  . aspirin EC 81 MG tablet Take 1 tablet (81 mg total) by mouth daily with breakfast.  . budesonide-formoterol (SYMBICORT) 160-4.5 MCG/ACT inhaler Inhale 2 puffs into the lungs 2 (two) times daily.  . carvedilol (COREG) 25 MG tablet TAKE (1) TABLET TWICE DAILY. (Patient taking differently: Take 25 mg by mouth 2 (two) times daily with a meal. )  . Cholecalciferol (VITAMIN D-3) 125 MCG (5000 UT) TABS Take 2 tablets by mouth daily.  . docusate sodium (COLACE) 100 MG capsule Take 1 capsule (100 mg total) by mouth every 12 (twelve) hours.  . furosemide (LASIX) 80 MG tablet TAKE (1) TABLET TWICE DAILY. (Patient taking differently: Take 80 mg by mouth 2 (two) times daily. )  . gabapentin (NEURONTIN) 300 MG capsule TAKE 1 CAPSULE BY MOUTH TWICE DAILY.  . glipiZIDE (GLUCOTROL) 10 MG tablet Take 1 tablet (10 mg total) by mouth 2 (two) times daily.  . isosorbide mononitrate (IMDUR) 60 MG 24 hr tablet TAKE 1 TABLET BY MOUTH 3 TIMES DAILY. (Patient taking differently: Take 60 mg by mouth 3 (three) times daily. )  . nitroGLYCERIN (NITROSTAT) 0.4 MG SL tablet PLACE 0.4 MG  UNDER THE TONGUE EVERY 5 MINUTES UP TO 3 DOSES AS NEEDED FOR CHEST PAIN.  . potassium chloride (K-DUR) 10 MEQ tablet TAKE 2 TABLETS BY MOUTH ONCE DAILY. (Patient taking differently: Take 20 mEq by mouth daily. )  . simvastatin (ZOCOR) 40 MG tablet TAKE 1 TABLET ONCE DAILY.  . tamsulosin (FLOMAX) 0.4 MG CAPS capsule Take 1 capsule (0.4 mg total) by mouth daily after supper.  . [DISCONTINUED] Dulaglutide (TRULICITY) 0.75 MG/0.5ML SOPN Inject 0.5 mLs into the skin once a week.    ROS:  Review of  Systems  Constitutional: Positive for malaise/fatigue.  Eyes: Negative for blurred vision.  Respiratory: Negative for cough, shortness of breath and wheezing.   Cardiovascular: Negative for chest pain and palpitations.  Gastrointestinal: Positive for abdominal pain.  Neurological: Negative for dizziness and headaches.     Objective:   Today's Vitals: BP 137/69 Comment: using at-home equipment  Pulse 65   Ht 6' 1" (1.854 m)   Wt 249 lb (112.9 kg)   BMI 32.85 kg/m  Vitals with BMI 09/01/2019 06/04/2019 05/27/2019  Height 6' 1" 6' 1" 6' 1"    Weight 249 lbs 259 lbs 261 lbs 10 oz  BMI 32.86 34.18 34.52  Systolic 137 138 142  Diastolic 69 65 68  Pulse 65 74 66     Physical Exam Comprehensive physical exam not conducted today as office visit was conducted over the phone.  Patient did sound well, he answered questions appropriately, he was alert and oriented.      Assessment and Plan   1. Controlled type 2 diabetes mellitus with complication, without long-term current use of insulin (HCC)   2. Essential hypertension   3. Vitamin D deficiency   4. Mixed hyperlipidemia   5. Fatigue, unspecified type   6. Chronic renal impairment, stage 3 (moderate), unspecified whether stage 3a or 3b CKD      Plan: 1.  We will increase his Trulicity from 0.75 mg every week to 1.5 mg weekly.  I did caution him that if he were to experience any right-sided abdominal pain he should let us know immediately.  He tells me he understands.  He will follow-up in approximately 4 weeks to see how his blood sugar is running at home.  He will come to the office next week for routine blood work, including A1c check.  2.  He will continue on his current medication regimen for now.  3.  He will continue taking his vitamin D supplement, he will come in and have his serum vitamin D level checked next week.  4.  He will continue on his current medication regimen, and will come in next week to have lipid panel  drawn.  He was told to come fasting to this blood draw.  5.  I will order blood work for further evaluation next week when he comes in to have blood drawn.  Including A1c, thyroid screen, lipid panel, CBC, metabolic panel.  Further recommendations will be made based upon these results.  6.  As stated above I will collect metabolic panel and check albumin levels in his urine when he comes to have his blood drawn next week.   Tests ordered Orders Placed This Encounter  Procedures  . CMP with eGFR([facility])  . Hemoglobin A1c  . Microalbumin/Creatinine Ratio, Urine  . CBC  . TSH  . Vitamin D, 25-hydroxy  . Lipid Panel      Meds ordered this encounter  Medications  . Dulaglutide (TRULICITY) 1.5 MG/0.5ML SOPN    Sig: Inject 1.5 mg into the skin once a week.    Dispense:  4 pen    Refill:  2    Order Specific Question:   Supervising Provider    Answer:   GOSRANI, NIMISH C [[id number]]    Patient to follow-up in 1 week for blood check, and then again in 4 to 6 weeks for office visit.  This telephone visit lasted for 17 minutes.  SARAH E GRAY, NP  

## 2019-09-09 ENCOUNTER — Other Ambulatory Visit (INDEPENDENT_AMBULATORY_CARE_PROVIDER_SITE_OTHER): Payer: Medicare HMO

## 2019-09-09 ENCOUNTER — Other Ambulatory Visit: Payer: Self-pay

## 2019-09-09 DIAGNOSIS — E782 Mixed hyperlipidemia: Secondary | ICD-10-CM | POA: Diagnosis not present

## 2019-09-09 DIAGNOSIS — E118 Type 2 diabetes mellitus with unspecified complications: Secondary | ICD-10-CM | POA: Diagnosis not present

## 2019-09-09 DIAGNOSIS — I10 Essential (primary) hypertension: Secondary | ICD-10-CM | POA: Diagnosis not present

## 2019-09-09 DIAGNOSIS — E559 Vitamin D deficiency, unspecified: Secondary | ICD-10-CM | POA: Diagnosis not present

## 2019-09-09 DIAGNOSIS — R5383 Other fatigue: Secondary | ICD-10-CM | POA: Diagnosis not present

## 2019-09-10 LAB — CBC
HCT: 44.9 % (ref 38.5–50.0)
Hemoglobin: 14.8 g/dL (ref 13.2–17.1)
MCH: 31.8 pg (ref 27.0–33.0)
MCHC: 33 g/dL (ref 32.0–36.0)
MCV: 96.6 fL (ref 80.0–100.0)
MPV: 10.8 fL (ref 7.5–12.5)
Platelets: 312 10*3/uL (ref 140–400)
RBC: 4.65 10*6/uL (ref 4.20–5.80)
RDW: 12.8 % (ref 11.0–15.0)
WBC: 9.8 10*3/uL (ref 3.8–10.8)

## 2019-09-10 LAB — COMPLETE METABOLIC PANEL WITH GFR
AG Ratio: 1.5 (calc) (ref 1.0–2.5)
ALT: 18 U/L (ref 9–46)
AST: 17 U/L (ref 10–35)
Albumin: 3.9 g/dL (ref 3.6–5.1)
Alkaline phosphatase (APISO): 73 U/L (ref 35–144)
BUN/Creatinine Ratio: 12 (calc) (ref 6–22)
BUN: 24 mg/dL (ref 7–25)
CO2: 30 mmol/L (ref 20–32)
Calcium: 9.4 mg/dL (ref 8.6–10.3)
Chloride: 99 mmol/L (ref 98–110)
Creat: 1.93 mg/dL — ABNORMAL HIGH (ref 0.70–1.18)
GFR, Est African American: 39 mL/min/{1.73_m2} — ABNORMAL LOW (ref >=60)
GFR, Est Non African American: 34 mL/min/{1.73_m2} — ABNORMAL LOW (ref >=60)
Globulin: 2.6 g/dL (calc) (ref 1.9–3.7)
Glucose, Bld: 202 mg/dL — ABNORMAL HIGH (ref 65–99)
Potassium: 4.7 mmol/L (ref 3.5–5.3)
Sodium: 138 mmol/L (ref 135–146)
Total Bilirubin: 0.4 mg/dL (ref 0.2–1.2)
Total Protein: 6.5 g/dL (ref 6.1–8.1)

## 2019-09-10 LAB — HEMOGLOBIN A1C
Hgb A1c MFr Bld: 7.7 % of total Hgb — ABNORMAL HIGH (ref <5.7)
Mean Plasma Glucose: 174 (calc)
eAG (mmol/L): 9.7 (calc)

## 2019-09-10 LAB — MICROALBUMIN / CREATININE URINE RATIO
Creatinine, Urine: 132 mg/dL (ref 20–320)
Microalb Creat Ratio: 175 mcg/mg creat — ABNORMAL HIGH (ref <30)
Microalb, Ur: 23.1 mg/dL

## 2019-09-10 LAB — LIPID PANEL
Cholesterol: 157 mg/dL (ref <200)
HDL: 29 mg/dL — ABNORMAL LOW (ref >=40)
LDL Cholesterol (Calc): 91 mg/dL (calc)
Non-HDL Cholesterol (Calc): 128 mg/dL (calc) (ref <130)
Total CHOL/HDL Ratio: 5.4 (calc) — ABNORMAL HIGH (ref <5.0)
Triglycerides: 281 mg/dL — ABNORMAL HIGH (ref <150)

## 2019-09-10 LAB — VITAMIN D 25 HYDROXY (VIT D DEFICIENCY, FRACTURES): Vit D, 25-Hydroxy: 66 ng/mL (ref 30–100)

## 2019-09-10 LAB — TSH: TSH: 2.63 mIU/L (ref 0.40–4.50)

## 2019-09-13 DIAGNOSIS — E0800 Diabetes mellitus due to underlying condition with hyperosmolarity without nonketotic hyperglycemic-hyperosmolar coma (NKHHC): Secondary | ICD-10-CM | POA: Diagnosis not present

## 2019-10-01 ENCOUNTER — Ambulatory Visit (INDEPENDENT_AMBULATORY_CARE_PROVIDER_SITE_OTHER): Payer: Medicare HMO | Admitting: Nurse Practitioner

## 2019-10-01 ENCOUNTER — Encounter (INDEPENDENT_AMBULATORY_CARE_PROVIDER_SITE_OTHER): Payer: Self-pay | Admitting: Nurse Practitioner

## 2019-10-01 ENCOUNTER — Other Ambulatory Visit: Payer: Self-pay

## 2019-10-01 VITALS — BP 150/65 | HR 69 | Temp 97.8°F | Ht 73.0 in | Wt 261.4 lb

## 2019-10-01 DIAGNOSIS — E1165 Type 2 diabetes mellitus with hyperglycemia: Secondary | ICD-10-CM

## 2019-10-01 DIAGNOSIS — R5383 Other fatigue: Secondary | ICD-10-CM | POA: Diagnosis not present

## 2019-10-01 DIAGNOSIS — E118 Type 2 diabetes mellitus with unspecified complications: Secondary | ICD-10-CM

## 2019-10-01 DIAGNOSIS — G4733 Obstructive sleep apnea (adult) (pediatric): Secondary | ICD-10-CM | POA: Diagnosis not present

## 2019-10-01 DIAGNOSIS — E669 Obesity, unspecified: Secondary | ICD-10-CM

## 2019-10-01 DIAGNOSIS — I5022 Chronic systolic (congestive) heart failure: Secondary | ICD-10-CM | POA: Diagnosis not present

## 2019-10-01 MED ORDER — TRULICITY 3 MG/0.5ML ~~LOC~~ SOAJ: 3.0000 mg | SUBCUTANEOUS | 3 refills | Status: DC

## 2019-10-01 NOTE — Progress Notes (Signed)
Subjective:  Patient ID: [name], male    DOB: [birth year]  Age: 74 y.o. MRN: [id number]  CC:  Chief Complaint  Patient presents with  . Diabetes  . Follow-up  . Other    Fatigue, weight gain, OSA, Weakness      HPI  This patient arrives today to follow-up for the above.  Diabetes: He has a history of type 2 diabetes.  He continues on glipizide 10 mg twice a day and Trulicity 1.5 mg weekly.  This was increased from 0.5 mg at his last office visit.  He tells me he is tolerating medications well but he notices that his blood sugar remains elevated.  He tells me it is usually in the 200s in a fasting state in the morning.  This is very concerning to him.  Fatigue/weakness/weight gain: He also is concerned about experiencing significant fatigue, weakness, and weight gain.  When I try to get him to define what weakness means to him he tells me "my whole body is just weak".  He tells me whenever he is trying to complete an activity it seems that he gets fatigued quickly.  He tells me this has been progressing over the last 6 months.  He also has noticed has been gaining weight progressively over the last 6 months.  He does have a medical history of congestive heart failure and he does follow-up with cardiology.  He has not noticed any significant change in his respiratory status and he does report that he continues to take his other medications including furosemide, Imdur, and Coreg as prescribed.  He does not weigh himself on a daily basis regularly.  Sleep apnea: He tells me he thinks he needs a new CPAP machine as his is quite old, and tells me it is much older than 74 years of age.  He was told that he would need a detailed prescription including CPAP settings and progress note from being evaluated by medical professional within the last 6 months.  His initial sleep study was conducted "many" years ago.     Past Medical History:  Diagnosis Date  . AAA (abdominal aortic  aneurysm) ([place])    Followed by Dr. Donnetta Hutching  . AICD (automatic cardioverter/defibrillator) present   . Angioedema    Secondary to ACE inhibitor  . Arthritis   . Bell palsy   . Carotid artery disease ([place])   . CHF (congestive heart failure) ([place])   . Coronary atherosclerosis of native coronary artery    Multivessel status post CABG  . Essential hypertension, benign   . Gout   . Hernia of abdominal wall   . Hypercholesteremia   . Ischemic cardiomyopathy    LVEF 45-50% 11/2011  . Migraines   . Morbid obesity ([place]) 02/12/2019  . Myocardial infarction ([place])   . Nephrolithiasis   . PAD (peripheral artery disease) ([place])    Followed by Dr. Donnetta Hutching  . Sleep apnea   . Type 2 diabetes mellitus (HCC)       Family History  Problem Relation Age of Onset  . Diabetes Mother        Type  I   . Varicose Veins Mother   . Heart disease Father 76       AAA  . AAA (abdominal aortic aneurysm) Father   . Hyperlipidemia Father   . 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  Social History   Tobacco Use  . Smoking status: Former Smoker    Packs/day: 1.00    Years: 25.00    Pack years: 25.00    Types: Cigarettes    Start date: 02/28/1979    Quit date: 05/23/2007    Years since quitting: 12.3  . Smokeless tobacco: Never Used  Substance Use Topics  . Alcohol use: No    Alcohol/week: 0.0 standard drinks     Current Meds  Medication Sig  . acetaminophen (TYLENOL) 500 MG tablet Take 1,000 mg by mouth daily as needed for mild pain, moderate pain or headache (pain).   Marland Kitchen albuterol (PROVENTIL) (2.5 MG/3ML) 0.083% nebulizer solution Take 3 mLs (2.5 mg total) by nebulization every 6 (six) hours as needed for wheezing or shortness of breath.  . allopurinol (ZYLOPRIM) 100 MG tablet TAKE 1 TABLET BY MOUTH ONCE DAILY.  Marland Kitchen amLODipine (NORVASC) 5 MG tablet Take 1 tablet (5 mg total) by mouth daily.  Marland Kitchen aspirin EC 81 MG  tablet Take 1 tablet (81 mg total) by mouth daily with breakfast.  . budesonide-formoterol (SYMBICORT) 160-4.5 MCG/ACT inhaler Inhale 2 puffs into the lungs 2 (two) times daily.  . carvedilol (COREG) 25 MG tablet TAKE (1) TABLET TWICE DAILY. (Patient taking differently: Take 25 mg by mouth 2 (two) times daily with a meal. )  . Cholecalciferol (VITAMIN D-3) 125 MCG (5000 UT) TABS Take 2 tablets by mouth daily.  Marland Kitchen docusate sodium (COLACE) 100 MG capsule Take 1 capsule (100 mg total) by mouth every 12 (twelve) hours.  . furosemide (LASIX) 80 MG tablet TAKE (1) TABLET TWICE DAILY. (Patient taking differently: Take 80 mg by mouth 2 (two) times daily. )  . gabapentin (NEURONTIN) 300 MG capsule TAKE 1 CAPSULE BY MOUTH TWICE DAILY.  Marland Kitchen glipiZIDE (GLUCOTROL) 10 MG tablet Take 1 tablet (10 mg total) by mouth 2 (two) times daily.  . isosorbide mononitrate (IMDUR) 60 MG 24 hr tablet TAKE 1 TABLET BY MOUTH 3 TIMES DAILY. (Patient taking differently: Take 60 mg by mouth 3 (three) times daily. )  . nitroGLYCERIN (NITROSTAT) 0.4 MG SL tablet PLACE 0.4 MG  UNDER THE TONGUE EVERY 5 MINUTES UP TO 3 DOSES AS NEEDED FOR CHEST PAIN.  Marland Kitchen potassium chloride (K-DUR) 10 MEQ tablet TAKE 2 TABLETS BY MOUTH ONCE DAILY. (Patient taking differently: Take 20 mEq by mouth daily. )  . simvastatin (ZOCOR) 40 MG tablet TAKE 1 TABLET ONCE DAILY.  . tamsulosin (FLOMAX) 0.4 MG CAPS capsule Take 1 capsule (0.4 mg total) by mouth daily after supper.  . [DISCONTINUED] Dulaglutide (TRULICITY) 1.5 EU/2.3NT SOPN Inject 1.5 mg into the skin once a week.    ROS:  Review of Systems  Constitutional: Positive for malaise/fatigue.  Respiratory: Negative for cough, shortness of breath and wheezing.   Cardiovascular: Positive for leg swelling. Negative for chest pain and palpitations.  Neurological: Positive for weakness. Negative for dizziness and headaches.     Objective:   Today's Vitals: BP (!) 150/65 (BP Location: Left Arm, Patient  Position: Sitting, Cuff Size: Normal)   Pulse 69   Temp 97.8 F (36.6 C) (Temporal)   Ht 6' 1"  (1.854 m)   Wt 261 lb 6.4 oz (118.6 kg)   SpO2 95%   BMI 34.49 kg/m  Vitals with BMI 10/01/2019 09/01/2019 06/04/2019  Height 6' 1"  6' 1"  6' 1"   Weight 261 lbs 6 oz 249 lbs 259 lbs  BMI 34.49 61.44 31.54  Systolic 008 676 195  Diastolic 65 69  65  Pulse 69 65 74     Physical Exam Vitals reviewed.  Constitutional:      Appearance: Normal appearance.  HENT:     Head: Normocephalic and atraumatic.  Neck:     Thyroid: No thyromegaly or thyroid tenderness.     Vascular: No carotid bruit.  Cardiovascular:     Rate and Rhythm: Normal rate and regular rhythm.  Pulmonary:     Effort: Pulmonary effort is normal.     Breath sounds: Normal breath sounds.  Musculoskeletal:     Cervical back: Neck supple.  Skin:    General: Skin is warm and dry.  Neurological:     Mental Status: He is alert and oriented to person, place, and time.     Cranial Nerves: Cranial nerves are intact.     Sensory: Sensation is intact.     Motor: Motor function is intact. No weakness.     Coordination: Coordination is intact. Coordination normal. Rapid alternating movements normal.     Gait: Gait is intact.  Psychiatric:        Mood and Affect: Mood normal.        Behavior: Behavior normal.        Thought Content: Thought content normal.        Judgment: Judgment normal.       PHQ9 SCORE ONLY 10/01/2019 02/16/2015  Score 4 0      Assessment and Plan   1. OSA (obstructive sleep apnea)   2. Controlled type 2 diabetes mellitus with complication, without long-term current use of insulin ([place])   3. Fatigue, unspecified type   4. Chronic systolic heart failure ([place])   5. Type 2 diabetes mellitus with hyperglycemia, without long-term current use of insulin (HCC)   6. Obesity (BMI 30.0-34.9)      Plan: 1.  I am going to refer him to neurology for additional sleep study and evaluation and management of his  sleep apnea.  2.,  5.  We will increase his Trulicity from 1.5 mg to 3 mg weekly.  I did discuss with him that this could be a sign that his body is not producing enough insulin, and if further titration of Trulicity does not result in improvement in his blood sugars he may need to consider insulin therapy.  Next A1c will not be due to be collected until July.  We will first focus on titrating Trulicity as much as possible and maybe consider adding SGLT2 in the meantime.  I did again verify that he has no personal or family history of any kind of thyroid cancer.  3.  I did not appreciate any clinical signs of weakness on his exam.  Neurological exam was intact.  I think he is experiencing fatigue as opposed to actual weakness.  Depression screening was low.  Most likely I think the cause is that he has multiple chronic conditions as well as on multiple medications.  I did discuss this with him, and he feels that there must be something else occurring to cause his symptoms.  I did discuss checking a vitamin B12 level as well as checking a testosterone level.  I did let him know that we check a testosterone level and he does have low testosterone we can consider placing him on testosterone therapy however we need to keep very careful with this as he does have significant cardiac history.  In fact, I would prefer if he was treated by Dr. Anastasio Champion in the event that his  testosterone levels are low as he is more familiar with the current medical studies and research that has been done on testosterone therapy.  I did discuss all of this with Mr. Carelock, and he would like to proceed with checking his vitamin B12 level as well as testosterone level.  Further recommendations will be made based upon these results.  If we cannot pinpoint any obvious reasons for his fatigue we could consider looking at his medication list and see what we could possibly come off of and see if this improves his symptoms as well.  4.  I did  reiterate the importance of weighing himself daily, and that if he were to see a 3 pound weight gain overnight or 5 pound weight gain within a week that he would need to notify this office.  He tells me he understands.  I am also can refer him to cardiac rehab as this may result in improvement in his fatigue symptoms.  6.  We will continue to work with him to help him with weight loss, I am hoping that if we improve his fatigue we may see increased physical activity which may result in some weight loss.     Tests ordered Orders Placed This Encounter  Procedures  . Vitamin B12  . Methylmalonic acid, serum  . Homocysteine  . Testosterone Total,Free,Bio, Males  . CMP with eGFR([facility])  . Ambulatory referral to Neurology  . Cardiac rehab evaluation      Meds ordered this encounter  Medications  . Dulaglutide (TRULICITY) 3 DJ/2.4QA SOPN    Sig: Inject 3 mg as directed once a week.    Dispense:  4 pen    Refill:  3    Order Specific Question:   Supervising Provider    Answer:   Doree Albee [[id number]]    Patient to follow-up in in approximately 1 month with Dr. Anastasio Champion. I spent >40 minutes dedicated to the care of this patient on the date of this encounter which includes a combination of either face-to-face or virtual contact with the patient, review of records , and ordering of tests and/or procedures.   Ailene Ards, NP

## 2019-10-06 LAB — TESTOSTERONE TOTAL,FREE,BIO, MALES
Albumin: 4.2 g/dL (ref 3.6–5.1)
Sex Hormone Binding: 45 nmol/L (ref 22–77)
Testosterone, Bioavailable: 72.9 ng/dL (ref 15.0–150.0)
Testosterone, Free: 37.8 pg/mL (ref 6.0–73.0)
Testosterone: 374 ng/dL (ref 250–827)

## 2019-10-06 LAB — COMPLETE METABOLIC PANEL WITH GFR
AG Ratio: 1.6 (calc) (ref 1.0–2.5)
ALT: 16 U/L (ref 9–46)
AST: 18 U/L (ref 10–35)
Albumin: 4.2 g/dL (ref 3.6–5.1)
Alkaline phosphatase (APISO): 75 U/L (ref 35–144)
BUN/Creatinine Ratio: 11 (calc) (ref 6–22)
BUN: 22 mg/dL (ref 7–25)
CO2: 34 mmol/L — ABNORMAL HIGH (ref 20–32)
Calcium: 10.7 mg/dL — ABNORMAL HIGH (ref 8.6–10.3)
Chloride: 99 mmol/L (ref 98–110)
Creat: 1.94 mg/dL — ABNORMAL HIGH (ref 0.70–1.18)
GFR, Est African American: 39 mL/min/{1.73_m2} — ABNORMAL LOW (ref >=60)
GFR, Est Non African American: 34 mL/min/{1.73_m2} — ABNORMAL LOW (ref >=60)
Globulin: 2.6 g/dL (calc) (ref 1.9–3.7)
Glucose, Bld: 188 mg/dL — ABNORMAL HIGH (ref 65–99)
Potassium: 5 mmol/L (ref 3.5–5.3)
Sodium: 141 mmol/L (ref 135–146)
Total Bilirubin: 0.3 mg/dL (ref 0.2–1.2)
Total Protein: 6.8 g/dL (ref 6.1–8.1)

## 2019-10-06 LAB — VITAMIN B12: Vitamin B-12: 373 pg/mL (ref 200–1100)

## 2019-10-06 LAB — METHYLMALONIC ACID, SERUM: Methylmalonic Acid, Quant: 359 nmol/L — ABNORMAL HIGH (ref 87–318)

## 2019-10-06 LAB — HOMOCYSTEINE: Homocysteine: 18.4 umol/L — ABNORMAL HIGH (ref <11.4)

## 2019-10-13 ENCOUNTER — Encounter (INDEPENDENT_AMBULATORY_CARE_PROVIDER_SITE_OTHER): Payer: Self-pay

## 2019-10-13 DIAGNOSIS — E0800 Diabetes mellitus due to underlying condition with hyperosmolarity without nonketotic hyperglycemic-hyperosmolar coma (NKHHC): Secondary | ICD-10-CM | POA: Diagnosis not present

## 2019-10-20 DIAGNOSIS — E119 Type 2 diabetes mellitus without complications: Secondary | ICD-10-CM | POA: Diagnosis not present

## 2019-10-20 DIAGNOSIS — E0800 Diabetes mellitus due to underlying condition with hyperosmolarity without nonketotic hyperglycemic-hyperosmolar coma (NKHHC): Secondary | ICD-10-CM | POA: Diagnosis not present

## 2019-10-23 ENCOUNTER — Other Ambulatory Visit (INDEPENDENT_AMBULATORY_CARE_PROVIDER_SITE_OTHER): Payer: Self-pay | Admitting: Internal Medicine

## 2019-10-23 ENCOUNTER — Other Ambulatory Visit: Payer: Self-pay | Admitting: Cardiology

## 2019-10-23 DIAGNOSIS — E118 Type 2 diabetes mellitus with unspecified complications: Secondary | ICD-10-CM

## 2019-11-04 ENCOUNTER — Other Ambulatory Visit: Payer: Self-pay

## 2019-11-04 ENCOUNTER — Encounter (INDEPENDENT_AMBULATORY_CARE_PROVIDER_SITE_OTHER): Payer: Self-pay | Admitting: Internal Medicine

## 2019-11-04 ENCOUNTER — Ambulatory Visit (INDEPENDENT_AMBULATORY_CARE_PROVIDER_SITE_OTHER): Payer: Medicare HMO | Admitting: Internal Medicine

## 2019-11-04 VITALS — BP 136/77 | HR 78 | Temp 97.8°F | Resp 18 | Ht 73.0 in | Wt 256.4 lb

## 2019-11-04 DIAGNOSIS — R5383 Other fatigue: Secondary | ICD-10-CM

## 2019-11-04 DIAGNOSIS — E118 Type 2 diabetes mellitus with unspecified complications: Secondary | ICD-10-CM

## 2019-11-04 DIAGNOSIS — E669 Obesity, unspecified: Secondary | ICD-10-CM | POA: Diagnosis not present

## 2019-11-04 DIAGNOSIS — E538 Deficiency of other specified B group vitamins: Secondary | ICD-10-CM

## 2019-11-04 DIAGNOSIS — I10 Essential (primary) hypertension: Secondary | ICD-10-CM | POA: Diagnosis not present

## 2019-11-04 NOTE — Progress Notes (Addendum)
Metrics: Intervention Frequency ACO  Documented Smoking Status Yearly  Screened one or more times in 24 months  Cessation Counseling or  Active cessation medication Past 24 months  Past 24 months   Guideline developer: UpToDate (See UpToDate for funding source) Date Released: 2014       Wellness Office Visit  Subjective:  Patient ID: [name], male    DOB: [birth year]  Age: 74 y.o. MRN: [id number]  CC: This man comes in for follow-up of his fatigue that he was worried about on the last visit with Judson Roch. HPI  Judson Roch recommended B12 supplementation and he has been taking B12 1000 mcg every day.  He feels much improved and energy levels are better. He continues to take Trulicity and glipizide for his diabetes.  Trulicity is expensive for him and he may not be able to afford this in the following month. He continues with amlodipine for his hypertension. Past Medical History:  Diagnosis Date  . AAA (abdominal aortic aneurysm) ([place])    Followed by Dr. Donnetta Hutching  . AICD (automatic cardioverter/defibrillator) present   . Angioedema    Secondary to ACE inhibitor  . Arthritis   . Bell palsy   . Carotid artery disease ([place])   . CHF (congestive heart failure) ([place])   . Coronary atherosclerosis of native coronary artery    Multivessel status post CABG  . Essential hypertension, benign   . Gout   . Hernia of abdominal wall   . Hypercholesteremia   . Ischemic cardiomyopathy    LVEF 45-50% 11/2011  . Migraines   . Morbid obesity ([place]) 02/12/2019  . Myocardial infarction ([place])   . Nephrolithiasis   . PAD (peripheral artery disease) ([place])    Followed by Dr. Donnetta Hutching  . Sleep apnea   . Type 2 diabetes mellitus ([place])    Past Surgical History:  Procedure Laterality Date  . ABDOMINAL AORTIC ANEURYSM REPAIR  November 19, 2013   [facility] :  Dr. Sammuel Hines  . ABDOMINAL AORTIC ANEURYSM REPAIR  09-07-2014   [facility]  . BI-VENTRICULAR IMPLANTABLE CARDIOVERTER DEFIBRILLATOR  (CRT-D)   11/05/2014  . BYPASS GRAFT POPLITEAL TO POPLITEAL Left 06/08/2014   Procedure: BYPASS GRAFT FEMORAL ARTERY TO ABOVE KNEE POPLITEAL;  Surgeon: Rosetta Posner, MD;  Location: [place];  Service: Vascular;  Laterality: Left;  . CARDIAC CATHETERIZATION N/A 10/02/2014   Procedure: Left Heart Cath and Cors/Grafts Angiography;  Surgeon: Belva Crome, MD;  Location: [place] [facility];  Service: Cardiovascular;  Laterality: N/A;  . CARDIAC CATHETERIZATION  "several"  . CATARACT EXTRACTION W/PHACO  03/23/2011   Procedure: CATARACT EXTRACTION PHACO AND INTRAOCULAR LENS PLACEMENT (IOC);  Surgeon: Tonny Branch;  [facility]: [facility];  Service: Ophthalmology;  Laterality: Left;  CDE: 15.99  . CATARACT EXTRACTION W/PHACO  04/17/2011   Procedure: CATARACT EXTRACTION PHACO AND INTRAOCULAR LENS PLACEMENT (IOC);  Surgeon: Tonny Branch;  Location: [facility];  Service: Ophthalmology;  Laterality: Right;  CDE: 23.45  . CORONARY ARTERY BYPASS GRAFT  2001   LIMA to LAD, SVG to diagonal and ramus, SVG to OM, SVG to AM  . DUPUYTREN CONTRACTURE RELEASE Left 2009  . EP IMPLANTABLE DEVICE N/A 11/05/2014   Procedure: BiV ICD Insertion CRT-D;  Surgeon: Evans Lance, MD;  Location: [place] [facility];  Service: Cardiovascular;  Laterality: N/A;  . EP IMPLANTABLE DEVICE N/A 02/24/2015   Procedure: Lead Revision/Repair;  Surgeon: Evans Lance, MD;  Location: [place] [facility];  Service: Cardiovascular;  Laterality: N/A;  . EXTRACORPOREAL SHOCK WAVE LITHOTRIPSY  X 1  . EYE SURGERY    . FOOT SURGERY Left 1963   "gangrene"  . ICD LEAD REMOVAL  02/24/2015  . LOWER EXTREMITY ANGIOGRAM Bilateral 04/28/2015   Procedure: Lower Extremity Angiogram;  Surgeon: Rosetta Posner, MD;  Location: [place] [facility];  Service: Cardiovascular;  Laterality: Bilateral;  . PERIPHERAL VASCULAR CATHETERIZATION N/A 04/28/2015   Procedure: Abdominal Aortogram;  Surgeon: Rosetta Posner, MD;  Location: [place] [facility];  Service: Cardiovascular;  Laterality: N/A;      Family History  Problem Relation Age of Onset  . Diabetes Mother        Type  I   . Varicose Veins Mother   . Heart disease Father 34       AAA  . AAA (abdominal aortic aneurysm) Father   . Hyperlipidemia Father   . 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   Social History   Tobacco Use  . Smoking status: Former Smoker    Packs/day: 1.00    Years: 25.00    Pack years: 25.00    Types: Cigarettes    Start date: 02/28/1979    Quit date: 05/23/2007    Years since quitting: 12.4  . Smokeless tobacco: Never Used  Substance Use Topics  . Alcohol use: No    Alcohol/week: 0.0 standard drinks    Current Meds  Medication Sig  . acetaminophen (TYLENOL) 500 MG tablet Take 1,000 mg by mouth daily as needed for mild pain, moderate pain or headache (pain).   Marland Kitchen albuterol (PROVENTIL) (2.5 MG/3ML) 0.083% nebulizer solution Take 3 mLs (2.5 mg total) by nebulization every 6 (six) hours as needed for wheezing or shortness of breath.  . allopurinol (ZYLOPRIM) 100 MG tablet TAKE 1 TABLET BY MOUTH ONCE DAILY.  Marland Kitchen amLODipine (NORVASC) 5 MG tablet Take 1 tablet (5 mg total) by mouth daily.  Marland Kitchen aspirin EC 81 MG tablet Take 1 tablet (81 mg total) by mouth daily with breakfast.  . budesonide-formoterol (SYMBICORT) 160-4.5 MCG/ACT inhaler Inhale 2 puffs into the lungs 2 (two) times daily.  . carvedilol (COREG) 25 MG tablet TAKE (1) TABLET TWICE DAILY. (Patient taking differently: Take 25 mg by mouth 2 (two) times daily with a meal. )  . Cholecalciferol (VITAMIN D-3) 125 MCG (5000 UT) TABS Take 2 tablets by mouth daily.  Marland Kitchen docusate sodium (COLACE) 100 MG capsule Take 1 capsule (100 mg total) by mouth every 12 (twelve) hours.  . Dulaglutide (TRULICITY) 3 ZY/6.0YT SOPN Inject 3 mg as directed once a week.  . furosemide (LASIX) 80 MG tablet Take 1 tablet (80 mg total) by mouth 2 (two) times daily.  Marland Kitchen gabapentin  (NEURONTIN) 300 MG capsule TAKE 1 CAPSULE BY MOUTH TWICE DAILY.  Marland Kitchen glipiZIDE (GLUCOTROL) 10 MG tablet TAKE (1) TABLET TWICE DAILY.  . isosorbide mononitrate (IMDUR) 60 MG 24 hr tablet TAKE 1 TABLET BY MOUTH 3 TIMES DAILY. (Patient taking differently: Take 60 mg by mouth 3 (three) times daily. )  . nitroGLYCERIN (NITROSTAT) 0.4 MG SL tablet PLACE 0.4 MG  UNDER THE TONGUE EVERY 5 MINUTES UP TO 3 DOSES AS NEEDED FOR CHEST PAIN.  Marland Kitchen potassium chloride (KLOR-CON) 10 MEQ tablet Take 2 tablets (20 mEq total) by mouth daily.  . simvastatin (ZOCOR) 40 MG tablet TAKE 1 TABLET ONCE DAILY.  . tamsulosin (FLOMAX) 0.4 MG CAPS capsule  Take 1 capsule (0.4 mg total) by mouth daily after supper.  . vitamin B-12 (CYANOCOBALAMIN) 1000 MCG tablet Take 1,000 mcg by mouth daily.       Depression screen [facility] 2/9 10/01/2019 02/16/2015 02/16/2015  Decreased Interest 0 (No Data) 0  Down, Depressed, Hopeless 0 - 0  PHQ - 2 Score 0 - 0  Altered sleeping 1 - -  Tired, decreased energy 3 - -  Change in appetite 0 - -  Feeling bad or failure about yourself  0 - -  Trouble concentrating 0 - -  Moving slowly or fidgety/restless 0 - -  Suicidal thoughts 0 - -  PHQ-9 Score 4 - -  Some recent data might be hidden     Objective:   Today's Vitals: BP 136/77 (BP Location: Right Arm, Patient Position: Sitting, Cuff Size: Normal)   Pulse 78   Temp 97.8 F (36.6 C) (Temporal)   Resp 18   Ht 6\' 1"  (1.854 m)   Wt 256 lb 6.4 oz (116.3 kg)   SpO2 91%   BMI 33.83 kg/m  Vitals with BMI 11/04/2019 10/01/2019 09/01/2019  Height 6\' 1"  6\' 1"  6\' 1"   Weight 256 lbs 6 oz 261 lbs 6 oz 249 lbs  BMI 33.84 64.33 29.51  Systolic 884 166 063  Diastolic 77 65 69  Pulse 78 69 65     Physical Exam  Looks systemically well.  He has lost 5 pounds since the last visit.  Blood pressure is well controlled compared to last visit also.     Assessment   1. Controlled type 2 diabetes mellitus with complication, without long-term current  use of insulin ([place])   2. Fatigue, unspecified type   3. Obesity (BMI 30.0-34.9)   4. Essential hypertension   5. Vitamin B12 deficiency       Tests ordered Orders Placed This Encounter  Procedures  . Vitamin B12     Plan: 1. We will check B12 levels. 2. He will continue to focus on diet for his diabetic control. 3. His hypertension seems to be well controlled on current therapy with amlodipine.  We will continue with his. 4. I will see him in a couple of months time to see how he is doing and we will check all the blood work including A1c then.   No orders of the defined types were placed in this encounter.   Doree Albee, MD

## 2019-11-05 LAB — VITAMIN B12: Vitamin B-12: 1879 pg/mL — ABNORMAL HIGH (ref 200–1100)

## 2019-11-12 DIAGNOSIS — E0800 Diabetes mellitus due to underlying condition with hyperosmolarity without nonketotic hyperglycemic-hyperosmolar coma (NKHHC): Secondary | ICD-10-CM | POA: Diagnosis not present

## 2019-11-12 DIAGNOSIS — E119 Type 2 diabetes mellitus without complications: Secondary | ICD-10-CM | POA: Diagnosis not present

## 2019-11-24 ENCOUNTER — Other Ambulatory Visit (INDEPENDENT_AMBULATORY_CARE_PROVIDER_SITE_OTHER): Payer: Self-pay | Admitting: Internal Medicine

## 2019-11-26 ENCOUNTER — Ambulatory Visit (INDEPENDENT_AMBULATORY_CARE_PROVIDER_SITE_OTHER): Payer: Medicare Other | Admitting: *Deleted

## 2019-11-26 DIAGNOSIS — I255 Ischemic cardiomyopathy: Secondary | ICD-10-CM | POA: Diagnosis not present

## 2019-11-26 LAB — CUP PACEART REMOTE DEVICE CHECK
Battery Remaining Longevity: 16 mo
Battery Remaining Percentage: 23 %
Battery Voltage: 2.8 V
Brady Statistic AP VP Percent: 14 %
Brady Statistic AP VS Percent: 1 %
Brady Statistic AS VP Percent: 82 %
Brady Statistic AS VS Percent: 2.1 %
Brady Statistic RA Percent Paced: 11 %
Date Time Interrogation Session: 20210707045427
HighPow Impedance: 78 Ohm
HighPow Impedance: 78 Ohm
Implantable Lead Implant Date: 20160616
Implantable Lead Implant Date: 20161005
Implantable Lead Implant Date: 20161005
Implantable Lead Location: 753858
Implantable Lead Location: 753859
Implantable Lead Location: 753860
Implantable Lead Model: 7122
Implantable Pulse Generator Implant Date: 20160616
Lead Channel Impedance Value: 400 Ohm
Lead Channel Impedance Value: 540 Ohm
Lead Channel Impedance Value: 790 Ohm
Lead Channel Pacing Threshold Amplitude: 0.625 V
Lead Channel Pacing Threshold Amplitude: 1 V
Lead Channel Pacing Threshold Amplitude: 2.25 V
Lead Channel Pacing Threshold Pulse Width: 0.5 ms
Lead Channel Pacing Threshold Pulse Width: 0.5 ms
Lead Channel Pacing Threshold Pulse Width: 1 ms
Lead Channel Sensing Intrinsic Amplitude: 1.5 mV
Lead Channel Sensing Intrinsic Amplitude: 8.2 mV
Lead Channel Setting Pacing Amplitude: 2 V
Lead Channel Setting Pacing Amplitude: 2 V
Lead Channel Setting Pacing Amplitude: 3.25 V
Lead Channel Setting Pacing Pulse Width: 0.5 ms
Lead Channel Setting Pacing Pulse Width: 1 ms
Lead Channel Setting Sensing Sensitivity: 0.5 mV
Pulse Gen Serial Number: 7288352

## 2019-11-28 NOTE — Progress Notes (Signed)
Remote ICD transmission.   

## 2019-12-04 ENCOUNTER — Other Ambulatory Visit: Payer: Self-pay | Admitting: Cardiology

## 2020-01-06 ENCOUNTER — Ambulatory Visit (INDEPENDENT_AMBULATORY_CARE_PROVIDER_SITE_OTHER): Payer: Medicare Other | Admitting: Internal Medicine

## 2020-01-06 ENCOUNTER — Other Ambulatory Visit: Payer: Self-pay

## 2020-01-06 ENCOUNTER — Encounter (INDEPENDENT_AMBULATORY_CARE_PROVIDER_SITE_OTHER): Payer: Self-pay | Admitting: Internal Medicine

## 2020-01-06 VITALS — BP 130/80 | HR 65 | Temp 97.4°F | Resp 18 | Ht 73.0 in | Wt 251.0 lb

## 2020-01-06 DIAGNOSIS — R5383 Other fatigue: Secondary | ICD-10-CM

## 2020-01-06 DIAGNOSIS — I10 Essential (primary) hypertension: Secondary | ICD-10-CM | POA: Diagnosis not present

## 2020-01-06 DIAGNOSIS — E782 Mixed hyperlipidemia: Secondary | ICD-10-CM | POA: Diagnosis not present

## 2020-01-06 DIAGNOSIS — E559 Vitamin D deficiency, unspecified: Secondary | ICD-10-CM | POA: Diagnosis not present

## 2020-01-06 DIAGNOSIS — E118 Type 2 diabetes mellitus with unspecified complications: Secondary | ICD-10-CM

## 2020-01-06 NOTE — Progress Notes (Signed)
Metrics: Intervention Frequency ACO  Documented Smoking Status Yearly  Screened one or more times in 24 months  Cessation Counseling or  Active cessation medication Past 24 months  Past 24 months   Guideline developer: UpToDate (See UpToDate for funding source) Date Released: 2014       Wellness Office Visit  Subjective:  Patient ID: [name], male    DOB: 24-Jan-1946  Age: 74 y.o. MRN: [id number]  CC: This man comes in for follow-up of diabetes, hypertension, obesity, fatigue. HPI  He continues on Trulicity for his diabetes as well as glipizide. He continues on statin therapy in the face of diabetes. He continues with amlodipine, carvedilol. He is followed by cardiology. His main symptom really appears to be fatigue.  B12 supplementation did help but he feels that "his life is over".  He is not depressed but he just has no energy. I did check his testosterone levels in May and they were suboptimal. Past Medical History:  Diagnosis Date  . AAA (abdominal aortic aneurysm) ([place])    Followed by Dr. Donnetta Hutching  . AICD (automatic cardioverter/defibrillator) present   . Angioedema    Secondary to ACE inhibitor  . Arthritis   . Bell palsy   . Carotid artery disease ([place])   . CHF (congestive heart failure) ([place])   . Coronary atherosclerosis of native coronary artery    Multivessel status post CABG  . Essential hypertension, benign   . Gout   . Hernia of abdominal wall   . Hypercholesteremia   . Ischemic cardiomyopathy    LVEF 45-50% 11/2011  . Migraines   . Morbid obesity ([place]) 02/12/2019  . Myocardial infarction ([place])   . Nephrolithiasis   . PAD (peripheral artery disease) ([place])    Followed by Dr. Donnetta Hutching  . Sleep apnea   . Type 2 diabetes mellitus ([place])    Past Surgical History:  Procedure Laterality Date  . ABDOMINAL AORTIC ANEURYSM REPAIR  November 19, 2013   [facility] :  Dr. Sammuel Hines  . ABDOMINAL AORTIC ANEURYSM REPAIR  09-07-2014   [facility]  .  BI-VENTRICULAR IMPLANTABLE CARDIOVERTER DEFIBRILLATOR  (CRT-D)  11/05/2014  . BYPASS GRAFT POPLITEAL TO POPLITEAL Left 06/08/2014   Procedure: BYPASS GRAFT FEMORAL ARTERY TO ABOVE KNEE POPLITEAL;  Surgeon: Rosetta Posner, MD;  Location: [place];  Service: Vascular;  Laterality: Left;  . CARDIAC CATHETERIZATION N/A 10/02/2014   Procedure: Left Heart Cath and Cors/Grafts Angiography;  Surgeon: Belva Crome, MD;  Location: [place] [facility];  Service: Cardiovascular;  Laterality: N/A;  . CARDIAC CATHETERIZATION  "several"  . CATARACT EXTRACTION W/PHACO  03/23/2011   Procedure: CATARACT EXTRACTION PHACO AND INTRAOCULAR LENS PLACEMENT (IOC);  Surgeon: Tonny Branch;  Location: [facility];  Service: Ophthalmology;  Laterality: Left;  CDE: 15.99  . CATARACT EXTRACTION W/PHACO  04/17/2011   Procedure: CATARACT EXTRACTION PHACO AND INTRAOCULAR LENS PLACEMENT (IOC);  Surgeon: Tonny Branch;  Location: [facility];  Service: Ophthalmology;  Laterality: Right;  CDE: 23.45  . CORONARY ARTERY BYPASS GRAFT  2001   LIMA to LAD, SVG to diagonal and ramus, SVG to OM, SVG to AM  . DUPUYTREN CONTRACTURE RELEASE Left 2009  . EP IMPLANTABLE DEVICE N/A 11/05/2014   Procedure: BiV ICD Insertion CRT-D;  Surgeon: Evans Lance, MD;  Location: [employer] [facility];  Service: Cardiovascular;  Laterality: N/A;  . EP IMPLANTABLE DEVICE N/A 02/24/2015   Procedure: Lead Revision/Repair;  Surgeon: Evans Lance, MD;  Location: [facility]  INVASIVE [facility];  Service: Cardiovascular;  Laterality: N/A;  . EXTRACORPOREAL SHOCK WAVE LITHOTRIPSY  X 1  . EYE SURGERY    . FOOT SURGERY Left 1963   "gangrene"  . ICD LEAD REMOVAL  02/24/2015  . LOWER EXTREMITY ANGIOGRAM Bilateral 04/28/2015   Procedure: Lower Extremity Angiogram;  Surgeon: Rosetta Posner, MD;  Location: [place] [facility];  Service: Cardiovascular;  Laterality: Bilateral;  . PERIPHERAL VASCULAR CATHETERIZATION N/A 04/28/2015   Procedure: Abdominal Aortogram;  Surgeon: Rosetta Posner, MD;  Location: [place] [facility];  Service: Cardiovascular;  Laterality: N/A;     Family History  Problem Relation Age of Onset  . Diabetes Mother        Type  I   . Varicose Veins Mother   . Heart disease Father 33       AAA  . AAA (abdominal aortic aneurysm) Father   . Hyperlipidemia Father   . 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   Social History   Tobacco Use  . Smoking status: Former Smoker    Packs/day: 1.00    Years: 25.00    Pack years: 25.00    Types: Cigarettes    Start date: 02/28/1979    Quit date: 05/23/2007    Years since quitting: 12.6  . Smokeless tobacco: Never Used  Substance Use Topics  . Alcohol use: No    Alcohol/week: 0.0 standard drinks    Current Meds  Medication Sig  . acetaminophen (TYLENOL) 500 MG tablet Take 1,000 mg by mouth daily as needed for mild pain, moderate pain or headache (pain).   Marland Kitchen albuterol (PROVENTIL) (2.5 MG/3ML) 0.083% nebulizer solution Take 3 mLs (2.5 mg total) by nebulization every 6 (six) hours as needed for wheezing or shortness of breath.  . allopurinol (ZYLOPRIM) 100 MG tablet TAKE 1 TABLET BY MOUTH ONCE DAILY.  Marland Kitchen amLODipine (NORVASC) 5 MG tablet Take 1 tablet (5 mg total) by mouth daily.  Marland Kitchen aspirin EC 81 MG tablet Take 1 tablet (81 mg total) by mouth daily with breakfast.  . budesonide-formoterol (SYMBICORT) 160-4.5 MCG/ACT inhaler Inhale 2 puffs into the lungs 2 (two) times daily.  . carvedilol (COREG) 25 MG tablet Take 1 tablet (25 mg total) by mouth 2 (two) times daily with a meal.  . Cholecalciferol (VITAMIN D-3) 125 MCG (5000 UT) TABS Take 2 tablets by mouth daily.  . Dulaglutide (TRULICITY) 3 RK/2.7CW SOPN Inject 3 mg as directed once a week.  . furosemide (LASIX) 80 MG tablet Take 1 tablet (80 mg total) by mouth 2 (two) times daily.  Marland Kitchen gabapentin (NEURONTIN) 300 MG capsule TAKE 1 CAPSULE BY MOUTH TWICE DAILY.  Marland Kitchen glipiZIDE (GLUCOTROL) 10 MG  tablet TAKE (1) TABLET TWICE DAILY.  . isosorbide mononitrate (IMDUR) 60 MG 24 hr tablet TAKE 1 TABLET BY MOUTH 3 TIMES DAILY. (Patient taking differently: Take 60 mg by mouth 3 (three) times daily. )  . nitroGLYCERIN (NITROSTAT) 0.4 MG SL tablet PLACE 0.4 MG  UNDER THE TONGUE EVERY 5 MINUTES UP TO 3 DOSES AS NEEDED FOR CHEST PAIN.  Marland Kitchen potassium chloride (KLOR-CON) 10 MEQ tablet Take 2 tablets (20 mEq total) by mouth daily.  . simvastatin (ZOCOR) 40 MG tablet TAKE 1 TABLET ONCE DAILY.  . vitamin B-12 (CYANOCOBALAMIN) 1000 MCG tablet Take 1,000 mcg by mouth daily.      Depression screen [facility] 2/9 10/01/2019 02/16/2015 02/16/2015  Decreased Interest 0 (No Data) 0  Down, Depressed, Hopeless 0 - 0  PHQ - 2 Score 0 - 0  Altered sleeping 1 - -  Tired, decreased energy 3 - -  Change in appetite 0 - -  Feeling bad or failure about yourself  0 - -  Trouble concentrating 0 - -  Moving slowly or fidgety/restless 0 - -  Suicidal thoughts 0 - -  PHQ-9 Score 4 - -  Some recent data might be hidden     Objective:   Today's Vitals: BP 130/80 (BP Location: Right Arm, Patient Position: Sitting, Cuff Size: Normal)   Pulse 65   Temp (!) 97.4 F (36.3 C) (Temporal)   Resp 18   Ht 6\' 1"  (1.854 m)   Wt 251 lb (113.9 kg)   SpO2 93%   BMI 33.12 kg/m  Vitals with BMI 01/06/2020 11/04/2019 10/01/2019  Height 6\' 1"  6\' 1"  6\' 1"   Weight 251 lbs 256 lbs 6 oz 261 lbs 6 oz  BMI 33.12 76.73 41.93  Systolic 790 240 973  Diastolic 80 77 65  Pulse 65 78 69     Physical Exam   He looks systemically frail and obese.  He has lost 5 pounds since the last time seen in the system.  Blood pressure is acceptable. His oxygen saturation at rest is 93 to 95% and after exertion drops to 90%.  He would not qualify for oxygen    Assessment   1. Essential hypertension   2. Controlled type 2 diabetes mellitus with complication, without long-term current use of insulin ([place])   3. Fatigue, unspecified type   4. Mixed  hyperlipidemia   5. Vitamin D deficiency disease       Tests ordered Orders Placed This Encounter  Procedures  . COMPLETE METABOLIC PANEL WITH GFR  . Hemoglobin A1c  . VITAMIN D 25 Hydroxy (Vit-D Deficiency, Fractures)     Plan: 1. Blood work is ordered. 2. He will continue with antihypertensive therapy as before and this is maintaining blood pressure and a acceptable level. 3. He will continue with glipizide and Trulicity and I will see what his A1c is now. 4. As far as his fatigue is concerned, I did mention the possibility of testosterone therapy but we need to have a full discussion regarding this. 5. Further recommendations will depend on blood results and I will see him in about a month's time to discuss testosterone therapy.   No orders of the defined types were placed in this encounter.   Doree Albee, MD

## 2020-01-07 LAB — COMPLETE METABOLIC PANEL WITH GFR
AG Ratio: 1.6 (calc) (ref 1.0–2.5)
ALT: 12 U/L (ref 9–46)
AST: 14 U/L (ref 10–35)
Albumin: 4.2 g/dL (ref 3.6–5.1)
Alkaline phosphatase (APISO): 65 U/L (ref 35–144)
BUN/Creatinine Ratio: 12 (calc) (ref 6–22)
BUN: 27 mg/dL — ABNORMAL HIGH (ref 7–25)
CO2: 31 mmol/L (ref 20–32)
Calcium: 10 mg/dL (ref 8.6–10.3)
Chloride: 100 mmol/L (ref 98–110)
Creat: 2.26 mg/dL — ABNORMAL HIGH (ref 0.70–1.18)
GFR, Est African American: 33 mL/min/{1.73_m2} — ABNORMAL LOW (ref >=60)
GFR, Est Non African American: 28 mL/min/{1.73_m2} — ABNORMAL LOW (ref >=60)
Globulin: 2.7 g/dL (calc) (ref 1.9–3.7)
Glucose, Bld: 129 mg/dL — ABNORMAL HIGH (ref 65–99)
Potassium: 4.8 mmol/L (ref 3.5–5.3)
Sodium: 143 mmol/L (ref 135–146)
Total Bilirubin: 0.6 mg/dL (ref 0.2–1.2)
Total Protein: 6.9 g/dL (ref 6.1–8.1)

## 2020-01-07 LAB — HEMOGLOBIN A1C
Hgb A1c MFr Bld: 6.9 % of total Hgb — ABNORMAL HIGH (ref <5.7)
Mean Plasma Glucose: 151 (calc)
eAG (mmol/L): 8.4 (calc)

## 2020-01-07 LAB — VITAMIN D 25 HYDROXY (VIT D DEFICIENCY, FRACTURES): Vit D, 25-Hydroxy: 62 ng/mL (ref 30–100)

## 2020-01-22 ENCOUNTER — Other Ambulatory Visit (INDEPENDENT_AMBULATORY_CARE_PROVIDER_SITE_OTHER): Payer: Self-pay | Admitting: Internal Medicine

## 2020-01-22 ENCOUNTER — Other Ambulatory Visit: Payer: Self-pay | Admitting: Cardiology

## 2020-01-22 DIAGNOSIS — E118 Type 2 diabetes mellitus with unspecified complications: Secondary | ICD-10-CM

## 2020-01-23 ENCOUNTER — Other Ambulatory Visit (INDEPENDENT_AMBULATORY_CARE_PROVIDER_SITE_OTHER): Payer: Self-pay | Admitting: Internal Medicine

## 2020-01-23 ENCOUNTER — Other Ambulatory Visit (INDEPENDENT_AMBULATORY_CARE_PROVIDER_SITE_OTHER): Payer: Self-pay | Admitting: Nurse Practitioner

## 2020-01-23 DIAGNOSIS — E118 Type 2 diabetes mellitus with unspecified complications: Secondary | ICD-10-CM

## 2020-02-02 ENCOUNTER — Encounter (INDEPENDENT_AMBULATORY_CARE_PROVIDER_SITE_OTHER): Payer: Self-pay | Admitting: Internal Medicine

## 2020-02-03 NOTE — Telephone Encounter (Signed)
Please advise 

## 2020-02-12 ENCOUNTER — Encounter (INDEPENDENT_AMBULATORY_CARE_PROVIDER_SITE_OTHER): Payer: Self-pay | Admitting: Nurse Practitioner

## 2020-02-23 ENCOUNTER — Ambulatory Visit (INDEPENDENT_AMBULATORY_CARE_PROVIDER_SITE_OTHER): Payer: Medicare Other

## 2020-02-23 DIAGNOSIS — I5022 Chronic systolic (congestive) heart failure: Secondary | ICD-10-CM | POA: Diagnosis not present

## 2020-02-23 DIAGNOSIS — I255 Ischemic cardiomyopathy: Secondary | ICD-10-CM

## 2020-02-24 ENCOUNTER — Encounter (INDEPENDENT_AMBULATORY_CARE_PROVIDER_SITE_OTHER): Payer: Self-pay | Admitting: Internal Medicine

## 2020-02-24 ENCOUNTER — Other Ambulatory Visit: Payer: Self-pay

## 2020-02-24 ENCOUNTER — Ambulatory Visit (INDEPENDENT_AMBULATORY_CARE_PROVIDER_SITE_OTHER): Payer: Medicare Other | Admitting: Internal Medicine

## 2020-02-24 VITALS — BP 130/76 | HR 73 | Temp 97.3°F | Resp 18 | Ht 73.0 in | Wt 252.6 lb

## 2020-02-24 DIAGNOSIS — E118 Type 2 diabetes mellitus with unspecified complications: Secondary | ICD-10-CM

## 2020-02-24 DIAGNOSIS — R5383 Other fatigue: Secondary | ICD-10-CM

## 2020-02-24 DIAGNOSIS — I10 Essential (primary) hypertension: Secondary | ICD-10-CM | POA: Diagnosis not present

## 2020-02-24 NOTE — Progress Notes (Signed)
Metrics: Intervention Frequency ACO  Documented Smoking Status Yearly  Screened one or more times in 24 months  Cessation Counseling or  Active cessation medication Past 24 months  Past 24 months   Guideline developer: UpToDate (See UpToDate for funding source) Date Released: 2014       Wellness Office Visit  Subjective:  Patient ID: [name], male    DOB: December 27, 1945  Age: 74 y.o. MRN: [id number]  CC: This man comes in to discuss all his blood results from the last visit and also to discuss testosterone therapy. HPI  His diabetes is actually improving with a hemoglobin A1c now at 6.9%.  His renal function is slightly worse but he is also dehydrated.  He has been encouraged to drink more water. I reviewed his testosterone results which were taken on 10/01/2019.  These are in the normal range but clearly suboptimal. He constantly feels fatigued.  This is overwhelming symptom that he would like to improve. Past Medical History:  Diagnosis Date  . AAA (abdominal aortic aneurysm) ([place])    Followed by Dr. Donnetta Hutching  . AICD (automatic cardioverter/defibrillator) present   . Angioedema    Secondary to ACE inhibitor  . Arthritis   . Bell palsy   . Carotid artery disease ([place])   . CHF (congestive heart failure) ([place])   . Coronary atherosclerosis of native coronary artery    Multivessel status post CABG  . Essential hypertension, benign   . Gout   . Hernia of abdominal wall   . Hypercholesteremia   . Ischemic cardiomyopathy    LVEF 45-50% 11/2011  . Migraines   . Morbid obesity ([place]) 02/12/2019  . Myocardial infarction ([place])   . Nephrolithiasis   . PAD (peripheral artery disease) ([place])    Followed by Dr. Donnetta Hutching  . Sleep apnea   . Type 2 diabetes mellitus ([place])    Past Surgical History:  Procedure Laterality Date  . ABDOMINAL AORTIC ANEURYSM REPAIR  November 19, 2013   [facility] :  Dr. Sammuel Hines  . ABDOMINAL AORTIC ANEURYSM REPAIR  09-07-2014   [facility]  .  BI-VENTRICULAR IMPLANTABLE CARDIOVERTER DEFIBRILLATOR  (CRT-D)  11/05/2014  . BYPASS GRAFT POPLITEAL TO POPLITEAL Left 06/08/2014   Procedure: BYPASS GRAFT FEMORAL ARTERY TO ABOVE KNEE POPLITEAL;  Surgeon: Rosetta Posner, MD;  Location: [facility];  Service: Vascular;  Laterality: Left;  . CARDIAC CATHETERIZATION N/A 10/02/2014   Procedure: Left Heart Cath and Cors/Grafts Angiography;  Surgeon: Belva Crome, MD;  Location: [facility];  Service: Cardiovascular;  Laterality: N/A;  . CARDIAC CATHETERIZATION  "several"  . CATARACT EXTRACTION W/PHACO  03/23/2011   Procedure: CATARACT EXTRACTION PHACO AND INTRAOCULAR LENS PLACEMENT (IOC);  Surgeon: Tonny Branch;  Location: [facility];  Service: Ophthalmology;  Laterality: Left;  CDE: 15.99  . CATARACT EXTRACTION W/PHACO  04/17/2011   Procedure: CATARACT EXTRACTION PHACO AND INTRAOCULAR LENS PLACEMENT (IOC);  Surgeon: Tonny Branch;  Location: [facility];  Service: Ophthalmology;  Laterality: Right;  CDE: 23.45  . CORONARY ARTERY BYPASS GRAFT  2001   LIMA to LAD, SVG to diagonal and ramus, SVG to OM, SVG to AM  . DUPUYTREN CONTRACTURE RELEASE Left 2009  . EP IMPLANTABLE DEVICE N/A 11/05/2014   Procedure: BiV ICD Insertion CRT-D;  Surgeon: Evans Lance, MD;  Location: [facility];  Service: Cardiovascular;  Laterality: N/A;  . EP IMPLANTABLE DEVICE N/A 02/24/2015   Procedure: Lead Revision/Repair;  Surgeon: Evans Lance, MD;  Location: [facility];  Service: Cardiovascular;  Laterality: N/A;  . EXTRACORPOREAL SHOCK WAVE LITHOTRIPSY  X 1  . EYE SURGERY    . FOOT SURGERY Left 1963   "gangrene"  . ICD LEAD REMOVAL  02/24/2015  . LOWER EXTREMITY ANGIOGRAM Bilateral 04/28/2015   Procedure: Lower Extremity Angiogram;  Surgeon: Rosetta Posner, MD;  Location: [facility];  Service: Cardiovascular;  Laterality: Bilateral;  . PERIPHERAL VASCULAR CATHETERIZATION N/A 04/28/2015   Procedure: Abdominal Aortogram;  Surgeon: Rosetta Posner, MD;  Location: [facility];  Service: Cardiovascular;  Laterality: N/A;     Family History  Problem Relation Age of Onset  . Diabetes Mother        Type  I   . Varicose Veins Mother   . Heart disease Father 82       AAA  . AAA (abdominal aortic aneurysm) Father   . Hyperlipidemia Father   . 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   Social History   Tobacco Use  . Smoking status: Former Smoker    Packs/day: 1.00    Years: 25.00    Pack years: 25.00    Types: Cigarettes    Start date: 02/28/1979    Quit date: 05/23/2007    Years since quitting: 12.7  . Smokeless tobacco: Never Used  Substance Use Topics  . Alcohol use: No    Alcohol/week: 0.0 standard drinks    Current Meds  Medication Sig  . acetaminophen (TYLENOL) 500 MG tablet Take 1,000 mg by mouth daily as needed for mild pain, moderate pain or headache (pain).   Marland Kitchen albuterol (PROVENTIL) (2.5 MG/3ML) 0.083% nebulizer solution Take 3 mLs (2.5 mg total) by nebulization every 6 (six) hours as needed for wheezing or shortness of breath.  . allopurinol (ZYLOPRIM) 100 MG tablet TAKE 1 TABLET BY MOUTH ONCE DAILY.  Marland Kitchen amLODipine (NORVASC) 5 MG tablet TAKE 1 TABLET ONCE DAILY.  Marland Kitchen aspirin EC 81 MG tablet Take 1 tablet (81 mg total) by mouth daily with breakfast.  . budesonide-formoterol (SYMBICORT) 160-4.5 MCG/ACT inhaler Inhale 2 puffs into the lungs 2 (two) times daily.  . carvedilol (COREG) 25 MG tablet Take 1 tablet (25 mg total) by mouth 2 (two) times daily with a meal.  . Cholecalciferol (VITAMIN D-3) 125 MCG (5000 UT) TABS Take 2 tablets by mouth daily.  Marland Kitchen docusate sodium (COLACE) 100 MG capsule Take 1 capsule (100 mg total) by mouth every 12 (twelve) hours.  . furosemide (LASIX) 80 MG tablet Take 1 tablet (80 mg total) by mouth 2 (two) times daily.  Marland Kitchen gabapentin (NEURONTIN) 300 MG capsule TAKE 1 CAPSULE BY MOUTH TWICE DAILY.  Marland Kitchen glipiZIDE  (GLUCOTROL) 10 MG tablet TAKE (1) TABLET TWICE DAILY.  . isosorbide mononitrate (IMDUR) 60 MG 24 hr tablet TAKE 1 TABLET BY MOUTH 3 TIMES DAILY.  . nitroGLYCERIN (NITROSTAT) 0.4 MG SL tablet PLACE 0.4 MG  UNDER THE TONGUE EVERY 5 MINUTES UP TO 3 DOSES AS NEEDED FOR CHEST PAIN.  Marland Kitchen potassium chloride (KLOR-CON) 10 MEQ tablet Take 2 tablets (20 mEq total) by mouth daily.  . simvastatin (ZOCOR) 40 MG tablet TAKE 1 TABLET ONCE DAILY.  . tamsulosin (FLOMAX) 0.4 MG CAPS capsule Take 1 capsule (0.4 mg total) by mouth daily after supper.  . TRULICITY 3 YC/1.4GY SOPN INJECT 3MG  AS DIRECTED ONCE A WEEK.  . vitamin B-12 (CYANOCOBALAMIN)  1000 MCG tablet Take 1,000 mcg by mouth daily.      Depression screen [facility] 2/9 10/01/2019 02/16/2015 02/16/2015  Decreased Interest 0 (No Data) 0  Down, Depressed, Hopeless 0 - 0  PHQ - 2 Score 0 - 0  Altered sleeping 1 - -  Tired, decreased energy 3 - -  Change in appetite 0 - -  Feeling bad or failure about yourself  0 - -  Trouble concentrating 0 - -  Moving slowly or fidgety/restless 0 - -  Suicidal thoughts 0 - -  PHQ-9 Score 4 - -  Some recent data might be hidden     Objective:   Today's Vitals: BP 130/76 (BP Location: Left Arm, Patient Position: Sitting, Cuff Size: Large)   Pulse 73   Temp (!) 97.3 F (36.3 C) (Temporal)   Resp 18   Ht 6\' 1"  (1.854 m)   Wt 252 lb 9.6 oz (114.6 kg)   SpO2 94%   BMI 33.33 kg/m  Vitals with BMI 02/24/2020 01/06/2020 11/04/2019  Height 6\' 1"  6\' 1"  6\' 1"   Weight 252 lbs 10 oz 251 lbs 256 lbs 6 oz  BMI 33.33 47.65 46.50  Systolic 354 656 812  Diastolic 76 80 77  Pulse 73 65 78     Physical Exam  He remains obese.  Blood pressure is well controlled.  Oxygen saturation is acceptable even on exertion.     Assessment   1. Fatigue, unspecified type   2. Controlled type 2 diabetes mellitus with complication, without long-term current use of insulin ([place])   3. Essential hypertension       Tests ordered No  orders of the defined types were placed in this encounter.    Plan: 1. I reviewed with him all his results and also discussed testosterone therapy.  I discussed FDA warnings, benefits and side effects and mode of administration.  He will think about it to see if he wants to try testosterone therapy and let me know. 2. Otherwise, he will continue with all the medications as before and will follow up with Sarah in about 3 months time.   No orders of the defined types were placed in this encounter.   Doree Albee, MD

## 2020-02-25 ENCOUNTER — Other Ambulatory Visit (INDEPENDENT_AMBULATORY_CARE_PROVIDER_SITE_OTHER): Payer: Self-pay | Admitting: Nurse Practitioner

## 2020-02-28 LAB — CUP PACEART REMOTE DEVICE CHECK
Battery Remaining Longevity: 19 mo
Battery Remaining Percentage: 28 %
Battery Voltage: 2.84 V
Brady Statistic AP VP Percent: 13 %
Brady Statistic AP VS Percent: 1 %
Brady Statistic AS VP Percent: 83 %
Brady Statistic AS VS Percent: 1.6 %
Brady Statistic RA Percent Paced: 10 %
Date Time Interrogation Session: 20211009010543
HighPow Impedance: 77 Ohm
HighPow Impedance: 77 Ohm
Implantable Lead Implant Date: 20160616
Implantable Lead Implant Date: 20161005
Implantable Lead Implant Date: 20161005
Implantable Lead Location: 753858
Implantable Lead Location: 753859
Implantable Lead Location: 753860
Implantable Lead Model: 7122
Implantable Pulse Generator Implant Date: 20160616
Lead Channel Impedance Value: 400 Ohm
Lead Channel Impedance Value: 540 Ohm
Lead Channel Impedance Value: 830 Ohm
Lead Channel Pacing Threshold Amplitude: 0.75 V
Lead Channel Pacing Threshold Amplitude: 1 V
Lead Channel Pacing Threshold Amplitude: 2.375 V
Lead Channel Pacing Threshold Pulse Width: 0.5 ms
Lead Channel Pacing Threshold Pulse Width: 0.5 ms
Lead Channel Pacing Threshold Pulse Width: 1 ms
Lead Channel Sensing Intrinsic Amplitude: 10 mV
Lead Channel Sensing Intrinsic Amplitude: 2.6 mV
Lead Channel Setting Pacing Amplitude: 2 V
Lead Channel Setting Pacing Amplitude: 2 V
Lead Channel Setting Pacing Amplitude: 3.375
Lead Channel Setting Pacing Pulse Width: 0.5 ms
Lead Channel Setting Pacing Pulse Width: 1 ms
Lead Channel Setting Sensing Sensitivity: 0.5 mV
Pulse Gen Serial Number: 7288352

## 2020-03-01 NOTE — Progress Notes (Signed)
Remote ICD transmission.   

## 2020-04-23 ENCOUNTER — Other Ambulatory Visit (INDEPENDENT_AMBULATORY_CARE_PROVIDER_SITE_OTHER): Payer: Self-pay | Admitting: Internal Medicine

## 2020-04-23 ENCOUNTER — Other Ambulatory Visit (INDEPENDENT_AMBULATORY_CARE_PROVIDER_SITE_OTHER): Payer: Self-pay | Admitting: Nurse Practitioner

## 2020-04-23 DIAGNOSIS — E118 Type 2 diabetes mellitus with unspecified complications: Secondary | ICD-10-CM

## 2020-04-27 DIAGNOSIS — G4733 Obstructive sleep apnea (adult) (pediatric): Secondary | ICD-10-CM | POA: Diagnosis not present

## 2020-05-10 ENCOUNTER — Telehealth (INDEPENDENT_AMBULATORY_CARE_PROVIDER_SITE_OTHER): Payer: Self-pay

## 2020-05-10 NOTE — Telephone Encounter (Signed)
Patient called and left a voice message to see if we had a copy of his Covid card and I do not see one in the system. I do see that he had his first 2 Moderna vaccines and I called back and left a detailed voice message to let him know that he can also pull his vaccine record up on his MyChart or they can go into his [facility] to get his vaccine history if needed for his 3rd booster covid vaccine.

## 2020-05-25 ENCOUNTER — Ambulatory Visit (INDEPENDENT_AMBULATORY_CARE_PROVIDER_SITE_OTHER): Payer: Medicare Other

## 2020-05-25 DIAGNOSIS — I255 Ischemic cardiomyopathy: Secondary | ICD-10-CM | POA: Diagnosis not present

## 2020-05-25 LAB — CUP PACEART REMOTE DEVICE CHECK
Battery Remaining Longevity: 18 mo
Battery Remaining Percentage: 27 %
Battery Voltage: 2.83 V
Brady Statistic AP VP Percent: 13 %
Brady Statistic AP VS Percent: 1 %
Brady Statistic AS VP Percent: 83 %
Brady Statistic AS VS Percent: 1.4 %
Brady Statistic RA Percent Paced: 10 %
Date Time Interrogation Session: 20220104020028
HighPow Impedance: 73 Ohm
HighPow Impedance: 73 Ohm
Implantable Lead Implant Date: 20160616
Implantable Lead Implant Date: 20161005
Implantable Lead Implant Date: 20161005
Implantable Lead Location: 753858
Implantable Lead Location: 753859
Implantable Lead Location: 753860
Implantable Lead Model: 7122
Implantable Pulse Generator Implant Date: 20160616
Lead Channel Impedance Value: 380 Ohm
Lead Channel Impedance Value: 530 Ohm
Lead Channel Impedance Value: 790 Ohm
Lead Channel Pacing Threshold Amplitude: 0.625 V
Lead Channel Pacing Threshold Amplitude: 1 V
Lead Channel Pacing Threshold Amplitude: 2.375 V
Lead Channel Pacing Threshold Pulse Width: 0.5 ms
Lead Channel Pacing Threshold Pulse Width: 0.5 ms
Lead Channel Pacing Threshold Pulse Width: 1 ms
Lead Channel Sensing Intrinsic Amplitude: 1.8 mV
Lead Channel Sensing Intrinsic Amplitude: 5.7 mV
Lead Channel Setting Pacing Amplitude: 2 V
Lead Channel Setting Pacing Amplitude: 2 V
Lead Channel Setting Pacing Amplitude: 3.375
Lead Channel Setting Pacing Pulse Width: 0.5 ms
Lead Channel Setting Pacing Pulse Width: 1 ms
Lead Channel Setting Sensing Sensitivity: 0.5 mV
Pulse Gen Serial Number: 7288352

## 2020-05-26 ENCOUNTER — Other Ambulatory Visit: Payer: Self-pay

## 2020-05-26 ENCOUNTER — Ambulatory Visit (INDEPENDENT_AMBULATORY_CARE_PROVIDER_SITE_OTHER): Payer: Medicare Other | Admitting: Nurse Practitioner

## 2020-05-26 ENCOUNTER — Other Ambulatory Visit (INDEPENDENT_AMBULATORY_CARE_PROVIDER_SITE_OTHER): Payer: Self-pay | Admitting: Internal Medicine

## 2020-05-26 ENCOUNTER — Encounter (INDEPENDENT_AMBULATORY_CARE_PROVIDER_SITE_OTHER): Payer: Self-pay | Admitting: Nurse Practitioner

## 2020-05-26 VITALS — BP 134/68 | HR 74 | Temp 97.3°F | Ht 73.0 in | Wt 251.2 lb

## 2020-05-26 DIAGNOSIS — G4733 Obstructive sleep apnea (adult) (pediatric): Secondary | ICD-10-CM

## 2020-05-26 DIAGNOSIS — M109 Gout, unspecified: Secondary | ICD-10-CM | POA: Diagnosis not present

## 2020-05-26 DIAGNOSIS — J449 Chronic obstructive pulmonary disease, unspecified: Secondary | ICD-10-CM

## 2020-05-26 DIAGNOSIS — I10 Essential (primary) hypertension: Secondary | ICD-10-CM

## 2020-05-26 DIAGNOSIS — I5032 Chronic diastolic (congestive) heart failure: Secondary | ICD-10-CM | POA: Diagnosis not present

## 2020-05-26 DIAGNOSIS — E118 Type 2 diabetes mellitus with unspecified complications: Secondary | ICD-10-CM

## 2020-05-26 DIAGNOSIS — E559 Vitamin D deficiency, unspecified: Secondary | ICD-10-CM

## 2020-05-26 DIAGNOSIS — N183 Chronic kidney disease, stage 3 unspecified: Secondary | ICD-10-CM

## 2020-05-26 DIAGNOSIS — I2510 Atherosclerotic heart disease of native coronary artery without angina pectoris: Secondary | ICD-10-CM

## 2020-05-26 NOTE — Progress Notes (Signed)
Subjective:  Patient ID: [name], male    DOB: 1945/05/30  Age: 75 y.o. MRN: [id number]  CC:  Chief Complaint  Patient presents with  . Follow-up    Patient states his weight has not changed and he does not understand why  . Congestive Heart Failure  . Coronary Artery Disease  . Hypertension  . COPD  . Diabetes  . Chronic Kidney Disease  . Gout  . Other    Vitamin D deficiency  . Sleep Apnea      HPI  This patient arrives today for the above.  He continues on his chronic medications for his chronic diseases as listed above.  Overall he tells me he is feeling well and that his breathing is stable.  Denies any chest pain.  He has noted that when he first wakes up in the morning he does not feel fully rested.  He started checking his pulse oximeter first thing in the morning for further evaluation and noticed that his oxygen is often in the 80s.  During the day when he checks his oxygen it is in the 90s.  He does have a history of sleep apnea and does wear CPAP.  He tells me that he believes he is overdue for sleep study.  He did call sleep medicine in the area and would like referral for this.  He also tells me he has not seen his kidney doctor in approximately 2 years and is not sure why he stopped going.  He has no significant complaints today.  Has seen eye doctor for diabetic eye exam within the last year.  He is due for blood work today as well as for foot exam.  He reports that his at home blood sugars generally run between 120-150, he only checks his blood sugar after eating a meal.  He has noted some hypoglycemic events but tells me these are rare and far between.  He tells me he starts to feel "shaky" when he is hypoglycemic, and knows to check his blood sugar if this symptom occurs.  If blood sugar is low he will treat appropriately with eating a snack.  He did mention that he developed rash after receiving the PPSV23 vaccine last year.  Past Medical History:   Diagnosis Date  . AAA (abdominal aortic aneurysm) ([place])    Followed by Dr. Donnetta Hutching  . AICD (automatic cardioverter/defibrillator) present   . Angioedema    Secondary to ACE inhibitor  . Arthritis   . Bell palsy   . Carotid artery disease ([place])   . CHF (congestive heart failure) ([place])   . Coronary atherosclerosis of native coronary artery    Multivessel status post CABG  . Essential hypertension, benign   . Gout   . Hernia of abdominal wall   . Hypercholesteremia   . Ischemic cardiomyopathy    LVEF 45-50% 11/2011  . Migraines   . Morbid obesity ([place]) 02/12/2019  . Myocardial infarction ([place])   . Nephrolithiasis   . PAD (peripheral artery disease) ([place])    Followed by Dr. Donnetta Hutching  . Sleep apnea   . Type 2 diabetes mellitus ([facility])       Family History  Problem Relation Age of Onset  . Diabetes Mother        Type  I   . Varicose Veins Mother   . Heart disease Father 51       AAA  . AAA (abdominal aortic aneurysm) Father   .  Hyperlipidemia Father   . 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   Social History   Tobacco Use  . Smoking status: Former Smoker    Packs/day: 1.00    Years: 25.00    Pack years: 25.00    Types: Cigarettes    Start date: 02/28/1979    Quit date: 05/23/2007    Years since quitting: 13.0  . Smokeless tobacco: Never Used  Substance Use Topics  . Alcohol use: No    Alcohol/week: 0.0 standard drinks     Current Meds  Medication Sig  . acetaminophen (TYLENOL) 500 MG tablet Take 1,000 mg by mouth daily as needed for mild pain, moderate pain or headache (pain).   Marland Kitchen albuterol (PROVENTIL) (2.5 MG/3ML) 0.083% nebulizer solution Take 3 mLs (2.5 mg total) by nebulization every 6 (six) hours as needed for wheezing or shortness of breath.  . allopurinol (ZYLOPRIM) 100 MG tablet TAKE 1 TABLET BY MOUTH ONCE DAILY.  Marland Kitchen amLODipine (NORVASC) 5 MG tablet TAKE 1 TABLET ONCE  DAILY.  Marland Kitchen aspirin EC 81 MG tablet Take 1 tablet (81 mg total) by mouth daily with breakfast.  . budesonide-formoterol (SYMBICORT) 160-4.5 MCG/ACT inhaler Inhale 2 puffs into the lungs 2 (two) times daily.  . carvedilol (COREG) 25 MG tablet Take 1 tablet (25 mg total) by mouth 2 (two) times daily with a meal.  . Cholecalciferol (VITAMIN D-3) 125 MCG (5000 UT) TABS Take 2 tablets by mouth daily.  Marland Kitchen docusate sodium (COLACE) 100 MG capsule Take 1 capsule (100 mg total) by mouth every 12 (twelve) hours.  . furosemide (LASIX) 80 MG tablet Take 1 tablet (80 mg total) by mouth 2 (two) times daily.  Marland Kitchen gabapentin (NEURONTIN) 300 MG capsule TAKE 1 CAPSULE BY MOUTH TWICE DAILY.  Marland Kitchen glipiZIDE (GLUCOTROL) 10 MG tablet TAKE (1) TABLET TWICE DAILY.  . isosorbide mononitrate (IMDUR) 60 MG 24 hr tablet TAKE 1 TABLET BY MOUTH 3 TIMES DAILY.  . nitroGLYCERIN (NITROSTAT) 0.4 MG SL tablet PLACE 0.4 MG  UNDER THE TONGUE EVERY 5 MINUTES UP TO 3 DOSES AS NEEDED FOR CHEST PAIN.  Marland Kitchen potassium chloride (KLOR-CON) 10 MEQ tablet Take 2 tablets (20 mEq total) by mouth daily.  . simvastatin (ZOCOR) 40 MG tablet TAKE 1 TABLET ONCE DAILY.  . tamsulosin (FLOMAX) 0.4 MG CAPS capsule Take 1 capsule (0.4 mg total) by mouth daily after supper.  . TRULICITY 3 TW/6.5KC SOPN INJECT 3MG AS DIRECTED ONCE A WEEK.  . vitamin B-12 (CYANOCOBALAMIN) 1000 MCG tablet Take 1,000 mcg by mouth daily.    ROS:  Review of Systems  Constitutional: Positive for malaise/fatigue. Negative for fever.  Eyes: Negative for blurred vision.  Respiratory: Negative for cough and shortness of breath.   Cardiovascular: Negative for chest pain.  Neurological: Negative for dizziness and headaches.     Objective:   Today's Vitals: BP 134/68   Pulse 74   Temp (!) 97.3 F (36.3 C) (Temporal)   Ht 6' 1" (1.854 m)   Wt 251 lb 3.2 oz (113.9 kg)   SpO2 96%   BMI 33.14 kg/m  Vitals with BMI 05/26/2020 02/24/2020 01/06/2020  Height 6' 1" 6' 1" 6' 1"  Weight 251  lbs 3 oz 252 lbs 10 oz 251 lbs  BMI 33.15 12.75 17.00  Systolic 174 944 967  Diastolic 68 76 80  Pulse 74 73 65     Physical Exam Vitals reviewed.  Constitutional:      Appearance: Normal appearance.  HENT:     Head: Normocephalic and atraumatic.  Cardiovascular:     Rate and Rhythm: Normal rate and regular rhythm.     Pulses:          Dorsalis pedis pulses are 1+ on the right side and 1+ on the left side.  Pulmonary:     Effort: Pulmonary effort is normal.     Breath sounds: Normal breath sounds.  Musculoskeletal:     Cervical back: Neck supple.     Right foot: No deformity.     Left foot: No deformity.  Feet:     Right foot:     Protective Sensation: 10 sites tested. 4 sites sensed.     Skin integrity: Callus and dry skin present.     Toenail Condition: Right toenails are abnormally thick and long.     Left foot:     Protective Sensation: 9 sites tested. 5 sites sensed.     Skin integrity: Callus and dry skin present.     Toenail Condition: Left toenails are abnormally thick and long.     Comments: Left great toe amputated Skin:    General: Skin is warm and dry.  Neurological:     Mental Status: He is alert and oriented to person, place, and time.  Psychiatric:        Mood and Affect: Mood normal.        Behavior: Behavior normal.        Thought Content: Thought content normal.        Judgment: Judgment normal.          Assessment and Plan   1. Chronic renal impairment, stage 3 (moderate), unspecified whether stage 3a or 3b CKD ([place])   2. OSA (obstructive sleep apnea)   3. Controlled type 2 diabetes mellitus with complication, without long-term current use of insulin ([place])   4. Essential hypertension   5. Vitamin D deficiency disease   6. Gout, unspecified cause, unspecified chronicity, unspecified site   7. Chronic diastolic CHF (congestive heart failure) ([place])   8. Coronary artery disease involving native heart, unspecified vessel or lesion type,  unspecified whether angina present   9. Chronic obstructive pulmonary disease, unspecified COPD type ([place])      Plan: 1.  We will refer patient back to nephrology for further assistance with managing his chronic kidney disease.  GFR does appear to be stable at this time. 2.  Will refer to sleep medicine for further assistance with evaluating and managing his OSA. 3.  He will continue on his current medications as prescribed, we will continue to monitor his blood sugars at home.  He is due for A1c today we will check that.  He will be due for eye exam again next year.  We will check urine for albuminuria today. 4.  He will continue on his antihypertensives as prescribed. 5.  We will continue on his vitamin D3 supplement we will check serum vitamin D level as well as metabolic panel today. 6.  He will continue on his allopurinol we will check uric acid level today. 7., 8.  Appears stable at this time he will continue on his chronic medications. 9.  He will continue on his Symbicort and albuterol as needed.   Tests ordered Orders Placed This Encounter  Procedures  . Hemoglobin A1c  . CMP with eGFR([facility])  . Uric Acid  . Vitamin D, 25-hydroxy  . Microalbumin/Creatinine Ratio,  Urine  . Ambulatory referral to Sleep Studies  . Ambulatory referral to Nephrology      No orders of the defined types were placed in this encounter.   Patient to follow-up in 3 months or sooner as needed based on blood work results and patient needs.  Ailene Ards, NP

## 2020-05-27 LAB — COMPLETE METABOLIC PANEL WITH GFR
AG Ratio: 1.4 (calc) (ref 1.0–2.5)
ALT: 11 U/L (ref 9–46)
AST: 14 U/L (ref 10–35)
Albumin: 4 g/dL (ref 3.6–5.1)
Alkaline phosphatase (APISO): 70 U/L (ref 35–144)
BUN/Creatinine Ratio: 12 (calc) (ref 6–22)
BUN: 28 mg/dL — ABNORMAL HIGH (ref 7–25)
CO2: 33 mmol/L — ABNORMAL HIGH (ref 20–32)
Calcium: 10.1 mg/dL (ref 8.6–10.3)
Chloride: 100 mmol/L (ref 98–110)
Creat: 2.26 mg/dL — ABNORMAL HIGH (ref 0.70–1.18)
GFR, Est African American: 32 mL/min/{1.73_m2} — ABNORMAL LOW (ref >=60)
GFR, Est Non African American: 28 mL/min/{1.73_m2} — ABNORMAL LOW (ref >=60)
Globulin: 2.8 g/dL (calc) (ref 1.9–3.7)
Glucose, Bld: 129 mg/dL (ref 65–139)
Potassium: 4.8 mmol/L (ref 3.5–5.3)
Sodium: 141 mmol/L (ref 135–146)
Total Bilirubin: 0.3 mg/dL (ref 0.2–1.2)
Total Protein: 6.8 g/dL (ref 6.1–8.1)

## 2020-05-27 LAB — MICROALBUMIN / CREATININE URINE RATIO
Creatinine, Urine: 52 mg/dL (ref 20–320)
Microalb Creat Ratio: 119 mcg/mg creat — ABNORMAL HIGH (ref <30)
Microalb, Ur: 6.2 mg/dL

## 2020-05-27 LAB — HEMOGLOBIN A1C
Hgb A1c MFr Bld: 7.2 % of total Hgb — ABNORMAL HIGH (ref <5.7)
Mean Plasma Glucose: 160 mg/dL
eAG (mmol/L): 8.9 mmol/L

## 2020-05-27 LAB — URIC ACID: Uric Acid, Serum: 8.1 mg/dL — ABNORMAL HIGH (ref 4.0–8.0)

## 2020-05-27 LAB — VITAMIN D 25 HYDROXY (VIT D DEFICIENCY, FRACTURES): Vit D, 25-Hydroxy: 68 ng/mL (ref 30–100)

## 2020-06-04 ENCOUNTER — Other Ambulatory Visit: Payer: Self-pay | Admitting: Cardiology

## 2020-06-09 NOTE — Progress Notes (Signed)
Remote ICD transmission.   

## 2020-07-12 ENCOUNTER — Telehealth (INDEPENDENT_AMBULATORY_CARE_PROVIDER_SITE_OTHER): Payer: Self-pay

## 2020-07-12 ENCOUNTER — Other Ambulatory Visit (INDEPENDENT_AMBULATORY_CARE_PROVIDER_SITE_OTHER): Payer: Self-pay | Admitting: Internal Medicine

## 2020-07-12 MED ORDER — BUDESONIDE-FORMOTEROL FUMARATE 160-4.5 MCG/ACT IN AERO: 2.0000 | INHALATION_SPRAY | Freq: Two times a day (BID) | RESPIRATORY_TRACT | 4 refills | Status: DC

## 2020-07-12 NOTE — Telephone Encounter (Signed)
Patient called and asked if Dr. Anastasio Champion can take over prescribing the following medication:  budesonide-formoterol (SYMBICORT) 160-4.5 MCG/ACT inhaler  Last filled 05/09/2020  Patient has contacted the other office several times and has not heard back from anyone for 2 months now.

## 2020-07-26 ENCOUNTER — Other Ambulatory Visit (INDEPENDENT_AMBULATORY_CARE_PROVIDER_SITE_OTHER): Payer: Self-pay | Admitting: Internal Medicine

## 2020-07-26 ENCOUNTER — Other Ambulatory Visit (INDEPENDENT_AMBULATORY_CARE_PROVIDER_SITE_OTHER): Payer: Self-pay | Admitting: Nurse Practitioner

## 2020-07-26 DIAGNOSIS — E118 Type 2 diabetes mellitus with unspecified complications: Secondary | ICD-10-CM

## 2020-07-26 DIAGNOSIS — I5032 Chronic diastolic (congestive) heart failure: Secondary | ICD-10-CM

## 2020-07-28 MED ORDER — ISOSORBIDE MONONITRATE ER 60 MG PO TB24: 60.0000 mg | ORAL_TABLET | Freq: Three times a day (TID) | ORAL | 0 refills | Status: DC

## 2020-07-28 MED ORDER — GLIPIZIDE 10 MG PO TABS: 10.0000 mg | ORAL_TABLET | Freq: Two times a day (BID) | ORAL | 0 refills | Status: DC

## 2020-07-28 NOTE — Addendum Note (Signed)
Addended by: Ailene Ards on: 07/28/2020 09:34 AM   Modules accepted: Orders

## 2020-07-28 NOTE — Telephone Encounter (Signed)
Will you review these medications? I didn't realize he had imdur ER to take three times a day. Is that a common way to prescribe that medication?

## 2020-07-30 DIAGNOSIS — G4733 Obstructive sleep apnea (adult) (pediatric): Secondary | ICD-10-CM | POA: Diagnosis not present

## 2020-08-04 ENCOUNTER — Other Ambulatory Visit: Payer: Self-pay | Admitting: Cardiology

## 2020-08-24 ENCOUNTER — Ambulatory Visit (INDEPENDENT_AMBULATORY_CARE_PROVIDER_SITE_OTHER): Payer: Medicare Other

## 2020-08-24 ENCOUNTER — Ambulatory Visit (INDEPENDENT_AMBULATORY_CARE_PROVIDER_SITE_OTHER): Payer: Medicare Other | Admitting: Internal Medicine

## 2020-08-24 ENCOUNTER — Encounter (INDEPENDENT_AMBULATORY_CARE_PROVIDER_SITE_OTHER): Payer: Self-pay | Admitting: Internal Medicine

## 2020-08-24 ENCOUNTER — Other Ambulatory Visit: Payer: Self-pay

## 2020-08-24 VITALS — BP 124/64 | HR 77 | Temp 97.9°F | Ht 73.0 in | Wt 253.2 lb

## 2020-08-24 DIAGNOSIS — I714 Abdominal aortic aneurysm, without rupture, unspecified: Secondary | ICD-10-CM

## 2020-08-24 DIAGNOSIS — I5032 Chronic diastolic (congestive) heart failure: Secondary | ICD-10-CM | POA: Diagnosis not present

## 2020-08-24 DIAGNOSIS — E1122 Type 2 diabetes mellitus with diabetic chronic kidney disease: Secondary | ICD-10-CM

## 2020-08-24 DIAGNOSIS — I10 Essential (primary) hypertension: Secondary | ICD-10-CM

## 2020-08-24 DIAGNOSIS — N184 Chronic kidney disease, stage 4 (severe): Secondary | ICD-10-CM | POA: Diagnosis not present

## 2020-08-24 DIAGNOSIS — I255 Ischemic cardiomyopathy: Secondary | ICD-10-CM

## 2020-08-24 NOTE — Progress Notes (Signed)
Metrics: Intervention Frequency ACO  Documented Smoking Status Yearly  Screened one or more times in 24 months  Cessation Counseling or  Active cessation medication Past 24 months  Past 24 months   Guideline developer: UpToDate (See UpToDate for funding source) Date Released: 2014       Wellness Office Visit  Subjective:  Patient ID: [name], male    DOB: 1945/07/18  Age: 75 y.o. MRN: [id number]  CC: This man comes in for follow-up of diabetes, hypertension, chronic diastolic congestive heart failure. HPI  He also has a history of abdominal aortic aneurysm which is being followed by Dr. Donnetta Hutching, vascular surgeon. He also has a history of AICD and diastolic congestive heart failure.  He has not seen his primary cardiologist for a very long time and he saw Dr. Lovena Le, electrophysiology, more than 1 year ago. He continues on glipizide and Trulicity for his diabetes and his sugars have been somewhat on the higher side.  His A1c was just above 7% on the last visit with Judson Roch. He continues with amlodipine for hypertension. He denies any chest pain, dyspnea or palpitations but he does describe some intermittent dizziness but without any significant palpitations although sometimes he says his heart skips a beat. Past Medical History:  Diagnosis Date  . AAA (abdominal aortic aneurysm) ([place])    Followed by Dr. Donnetta Hutching  . AICD (automatic cardioverter/defibrillator) present   . Angioedema    Secondary to ACE inhibitor  . Arthritis   . Bell palsy   . Carotid artery disease ([place])   . CHF (congestive heart failure) ([place])   . Coronary atherosclerosis of native coronary artery    Multivessel status post CABG  . Essential hypertension, benign   . Gout   . Hernia of abdominal wall   . Hypercholesteremia   . Ischemic cardiomyopathy    LVEF 45-50% 11/2011  . Migraines   . Morbid obesity ([place]) 02/12/2019  . Myocardial infarction ([place])   . Nephrolithiasis   . PAD (peripheral artery  disease) ([place])    Followed by Dr. Donnetta Hutching  . Sleep apnea   . Type 2 diabetes mellitus ([place])    Past Surgical History:  Procedure Laterality Date  . ABDOMINAL AORTIC ANEURYSM REPAIR  November 19, 2013   [facility] :  Dr. Sammuel Hines  . ABDOMINAL AORTIC ANEURYSM REPAIR  09-07-2014   [facility]  . BI-VENTRICULAR IMPLANTABLE CARDIOVERTER DEFIBRILLATOR  (CRT-D)  11/05/2014  . BYPASS GRAFT POPLITEAL TO POPLITEAL Left 06/08/2014   Procedure: BYPASS GRAFT FEMORAL ARTERY TO ABOVE KNEE POPLITEAL;  Surgeon: Rosetta Posner, MD;  Location: [place];  Service: Vascular;  Laterality: Left;  . CARDIAC CATHETERIZATION N/A 10/02/2014   Procedure: Left Heart Cath and Cors/Grafts Angiography;  Surgeon: Belva Crome, MD;  Location: [place] [facility];  Service: Cardiovascular;  Laterality: N/A;  . CARDIAC CATHETERIZATION  "several"  . CATARACT EXTRACTION W/PHACO  03/23/2011   Procedure: CATARACT EXTRACTION PHACO AND INTRAOCULAR LENS PLACEMENT (IOC);  Surgeon: Tonny Branch;  [facility]: [facility];  Service: Ophthalmology;  Laterality: Left;  CDE: 15.99  . CATARACT EXTRACTION W/PHACO  04/17/2011   Procedure: CATARACT EXTRACTION PHACO AND INTRAOCULAR LENS PLACEMENT (IOC);  Surgeon: Tonny Branch;  [facility]: [facility];  Service: Ophthalmology;  Laterality: Right;  CDE: 23.45  . CORONARY ARTERY BYPASS GRAFT  2001   LIMA to LAD, SVG to diagonal and ramus, SVG to OM, SVG to AM  . DUPUYTREN CONTRACTURE RELEASE Left 2009  . EP IMPLANTABLE  DEVICE N/A 11/05/2014   Procedure: BiV ICD Insertion CRT-D;  Surgeon: Evans Lance, MD;  Location: [place] [facility];  Service: Cardiovascular;  Laterality: N/A;  . EP IMPLANTABLE DEVICE N/A 02/24/2015   Procedure: Lead Revision/Repair;  Surgeon: Evans Lance, MD;  Location: [place] [facility];  Service: Cardiovascular;  Laterality: N/A;  . EXTRACORPOREAL SHOCK WAVE LITHOTRIPSY  X 1  . EYE SURGERY    . FOOT SURGERY Left 1963   "gangrene"  . ICD LEAD REMOVAL  02/24/2015  . LOWER EXTREMITY ANGIOGRAM  Bilateral 04/28/2015   Procedure: Lower Extremity Angiogram;  Surgeon: Rosetta Posner, MD;  Location: [place] [facility];  Service: Cardiovascular;  Laterality: Bilateral;  . PERIPHERAL VASCULAR CATHETERIZATION N/A 04/28/2015   Procedure: Abdominal Aortogram;  Surgeon: Rosetta Posner, MD;  Location: [place] [facility];  Service: Cardiovascular;  Laterality: N/A;     Family History  Problem Relation Age of Onset  . Diabetes Mother        Type  I   . Varicose Veins Mother   . Heart disease Father 30       AAA  . AAA (abdominal aortic aneurysm) Father   . Hyperlipidemia Father   . 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   Social History   Tobacco Use  . Smoking status: Former Smoker    Packs/day: 1.00    Years: 25.00    Pack years: 25.00    Types: Cigarettes    Start date: 02/28/1979    Quit date: 05/23/2007    Years since quitting: 13.2  . Smokeless tobacco: Never Used  Substance Use Topics  . Alcohol use: No    Alcohol/week: 0.0 standard drinks    Current Meds  Medication Sig  . acetaminophen (TYLENOL) 500 MG tablet Take 1,000 mg by mouth daily as needed for mild pain, moderate pain or headache (pain).   Marland Kitchen albuterol (PROVENTIL) (2.5 MG/3ML) 0.083% nebulizer solution Take 3 mLs (2.5 mg total) by nebulization every 6 (six) hours as needed for wheezing or shortness of breath.  . allopurinol (ZYLOPRIM) 100 MG tablet TAKE 1 TABLET BY MOUTH ONCE DAILY.  Marland Kitchen amLODipine (NORVASC) 5 MG tablet TAKE 1 TABLET ONCE DAILY.  Marland Kitchen aspirin EC 81 MG tablet Take 1 tablet (81 mg total) by mouth daily with breakfast.  . budesonide-formoterol (SYMBICORT) 160-4.5 MCG/ACT inhaler Inhale 2 puffs into the lungs 2 (two) times daily.  . carvedilol (COREG) 25 MG tablet TAKE 1 TABLET BY MOUTH TWICE DAILY WITH A MEAL.  Marland Kitchen Cholecalciferol (VITAMIN D-3) 125 MCG (5000 UT) TABS Take 2 tablets by mouth daily.  Marland Kitchen docusate sodium  (COLACE) 100 MG capsule Take 1 capsule (100 mg total) by mouth every 12 (twelve) hours.  . furosemide (LASIX) 80 MG tablet Take 1 tablet (80 mg total) by mouth 2 (two) times daily.  Marland Kitchen gabapentin (NEURONTIN) 300 MG capsule TAKE 1 CAPSULE BY MOUTH TWICE DAILY.  Marland Kitchen glipiZIDE (GLUCOTROL) 10 MG tablet Take 1 tablet (10 mg total) by mouth 2 (two) times daily before a meal.  . isosorbide mononitrate (IMDUR) 60 MG 24 hr tablet Take 1 tablet (60 mg total) by mouth 3 (three) times daily.  . nitroGLYCERIN (NITROSTAT) 0.4 MG SL tablet PLACE 0.4 MG  UNDER THE TONGUE EVERY 5 MINUTES UP TO 3 DOSES AS NEEDED FOR CHEST PAIN.  Marland Kitchen potassium chloride (KLOR-CON) 10 MEQ tablet Take 2  tablets (20 mEq total) by mouth daily.  . simvastatin (ZOCOR) 40 MG tablet TAKE 1 TABLET ONCE DAILY.  . tamsulosin (FLOMAX) 0.4 MG CAPS capsule Take 1 capsule (0.4 mg total) by mouth daily after supper.  . TRULICITY 3 0000000 SOPN INJECT '3MG'$  AS DIRECTED ONCE A WEEK.  . vitamin B-12 (CYANOCOBALAMIN) 1000 MCG tablet Take 1,000 mcg by mouth daily.     [place] Office Visit from 08/24/2020 in [facility]  PHQ-9 Total Score 0      Objective:   Today's Vitals: BP 124/64   Pulse 77   Temp 97.9 F (36.6 C) (Temporal)   Ht '6\' 1"'$  (1.854 m)   Wt 253 lb 3.2 oz (114.9 kg)   SpO2 96%   BMI 33.41 kg/m  Vitals with BMI 08/24/2020 05/26/2020 02/24/2020  Height '6\' 1"'$  '6\' 1"'$  '6\' 1"'$   Weight 253 lbs 3 oz 251 lbs 3 oz 252 lbs 10 oz  BMI 33.41 [id number]  Systolic [id number]  Diastolic 64 68 76  Pulse 77 74 73     Physical Exam  He remains obese.  Blood pressure in a good range.  Heart sounds are present without any murmurs.  Mostly the rhythm is regular with occasional ectopics.  Lung fields are clear.  He is not in decompensated heart failure at the present time.     Assessment   1. AAA (abdominal aortic aneurysm) without rupture ([place])   2. Diabetes mellitus with stage 4 chronic kidney disease ([place])   3. Essential  hypertension   4. Chronic diastolic CHF (congestive heart failure) ([place])       Tests ordered Orders Placed This Encounter  Procedures  . COMPLETE METABOLIC PANEL WITH GFR  . Hemoglobin A1c  . Ambulatory referral to Cardiology  . Ambulatory referral to Nephrology     Plan: 1. Continue to monitor for his abdominal aortic aneurysm with vascular surgery. 2. Continue with glipizide and Trulicity and we may need to adjust the dose of Trulicity depending on his A1c which we will check today. 3. Continue with antihypertensive medications.  He also has a history of chronic kidney disease and I will refer him to nephrology for this.  We will refer him to Dr. Theador Hawthorne. 4. Follow-up with Judson Roch in about 3 months.   No orders of the defined types were placed in this encounter.   Doree Albee, MD

## 2020-08-25 LAB — COMPLETE METABOLIC PANEL WITH GFR
AG Ratio: 1.4 (calc) (ref 1.0–2.5)
ALT: 15 U/L (ref 9–46)
AST: 15 U/L (ref 10–35)
Albumin: 3.9 g/dL (ref 3.6–5.1)
Alkaline phosphatase (APISO): 63 U/L (ref 35–144)
BUN/Creatinine Ratio: 9 (calc) (ref 6–22)
BUN: 19 mg/dL (ref 7–25)
CO2: 32 mmol/L (ref 20–32)
Calcium: 10.1 mg/dL (ref 8.6–10.3)
Chloride: 101 mmol/L (ref 98–110)
Creat: 2.05 mg/dL — ABNORMAL HIGH (ref 0.70–1.18)
GFR, Est African American: 36 mL/min/{1.73_m2} — ABNORMAL LOW (ref >=60)
GFR, Est Non African American: 31 mL/min/{1.73_m2} — ABNORMAL LOW (ref >=60)
Globulin: 2.7 g/dL (calc) (ref 1.9–3.7)
Glucose, Bld: 155 mg/dL — ABNORMAL HIGH (ref 65–139)
Potassium: 4.7 mmol/L (ref 3.5–5.3)
Sodium: 141 mmol/L (ref 135–146)
Total Bilirubin: 0.4 mg/dL (ref 0.2–1.2)
Total Protein: 6.6 g/dL (ref 6.1–8.1)

## 2020-08-25 LAB — HEMOGLOBIN A1C
Hgb A1c MFr Bld: 7.1 % of total Hgb — ABNORMAL HIGH (ref <5.7)
Mean Plasma Glucose: 157 mg/dL
eAG (mmol/L): 8.7 mmol/L

## 2020-08-26 LAB — CUP PACEART REMOTE DEVICE CHECK
Battery Remaining Longevity: 16 mo
Battery Remaining Percentage: 24 %
Battery Voltage: 2.8 V
Brady Statistic AP VP Percent: 13 %
Brady Statistic AP VS Percent: 1 %
Brady Statistic AS VP Percent: 83 %
Brady Statistic AS VS Percent: 1.6 %
Brady Statistic RA Percent Paced: 9.7 %
Date Time Interrogation Session: 20220405022823
HighPow Impedance: 71 Ohm
HighPow Impedance: 71 Ohm
Implantable Lead Implant Date: 20160616
Implantable Lead Implant Date: 20161005
Implantable Lead Implant Date: 20161005
Implantable Lead Location: 753858
Implantable Lead Location: 753859
Implantable Lead Location: 753860
Implantable Lead Model: 7122
Implantable Pulse Generator Implant Date: 20160616
Lead Channel Impedance Value: 400 Ohm
Lead Channel Impedance Value: 560 Ohm
Lead Channel Impedance Value: 810 Ohm
Lead Channel Pacing Threshold Amplitude: 0.625 V
Lead Channel Pacing Threshold Amplitude: 1 V
Lead Channel Pacing Threshold Amplitude: 2.625 V
Lead Channel Pacing Threshold Pulse Width: 0.5 ms
Lead Channel Pacing Threshold Pulse Width: 0.5 ms
Lead Channel Pacing Threshold Pulse Width: 1 ms
Lead Channel Sensing Intrinsic Amplitude: 1.6 mV
Lead Channel Sensing Intrinsic Amplitude: 11.6 mV
Lead Channel Setting Pacing Amplitude: 2 V
Lead Channel Setting Pacing Amplitude: 2 V
Lead Channel Setting Pacing Amplitude: 3.625
Lead Channel Setting Pacing Pulse Width: 0.5 ms
Lead Channel Setting Pacing Pulse Width: 1 ms
Lead Channel Setting Sensing Sensitivity: 0.5 mV
Pulse Gen Serial Number: 7288352

## 2020-09-06 NOTE — Progress Notes (Signed)
Remote ICD transmission.   

## 2020-09-15 ENCOUNTER — Encounter: Payer: Self-pay | Admitting: Cardiology

## 2020-09-15 ENCOUNTER — Other Ambulatory Visit: Payer: Self-pay

## 2020-09-15 ENCOUNTER — Ambulatory Visit (INDEPENDENT_AMBULATORY_CARE_PROVIDER_SITE_OTHER): Payer: Medicare Other | Admitting: Cardiology

## 2020-09-15 VITALS — BP 148/68 | HR 76 | Ht 73.0 in | Wt 253.0 lb

## 2020-09-15 DIAGNOSIS — I255 Ischemic cardiomyopathy: Secondary | ICD-10-CM

## 2020-09-15 DIAGNOSIS — I5022 Chronic systolic (congestive) heart failure: Secondary | ICD-10-CM | POA: Diagnosis not present

## 2020-09-15 DIAGNOSIS — N1832 Chronic kidney disease, stage 3b: Secondary | ICD-10-CM

## 2020-09-15 NOTE — Progress Notes (Signed)
Cardiology Office Note  Date: 09/15/2020   ID: [name], [name] August 05, 1945, MRN [id number]  PCP:  Ailene Ards, NP  Cardiologist:  Rozann Lesches, MD Electrophysiologist:  None   Chief Complaint  Patient presents with  . Cardiac follow-up    History of Present Illness: [name] is a 75 y.o. male last seen in December 2019.  He presents overdue for follow-up.  States that he has been staying around his house mostly during the pandemic, has his medications and groceries delivered.  Still does basic ADLs, cuts his grass with a riding lawn more.  Reports NYHA class II-III dyspnea, no angina symptoms or palpitations.  He follows with Dr. Lovena Le, St. Jude biventricular ICD in place.  Recent device interrogation revealed normal function, 96% biventricular pacing.  He has had no device shocks or syncope.  Echocardiogram was in September 2019, LVEF 45 to 50% range at that point and mild diastolic dysfunction.  We discussed getting a follow-up echocardiogram.  Medications are reviewed below.  I reviewed his recent lab work, creatinine 2.05 was relatively stable.  Past Medical History:  Diagnosis Date  . AAA (abdominal aortic aneurysm) ([place])    Followed by Dr. Donnetta Hutching  . AICD (automatic cardioverter/defibrillator) present   . Angioedema    Secondary to ACE inhibitor  . Arthritis   . Bell palsy   . Carotid artery disease ([place])   . CHF (congestive heart failure) ([place])   . Coronary atherosclerosis of native coronary artery    Multivessel status post CABG  . Essential hypertension, benign   . Gout   . Hernia of abdominal wall   . Hypercholesteremia   . Ischemic cardiomyopathy    LVEF 45-50% 11/2011  . Migraines   . Morbid obesity ([place]) 02/12/2019  . Myocardial infarction ([place])   . Nephrolithiasis   . PAD (peripheral artery disease) ([place])    Followed by Dr. Donnetta Hutching  . Sleep apnea   . Type 2 diabetes mellitus ([place])     Past Surgical History:  Procedure  Laterality Date  . ABDOMINAL AORTIC ANEURYSM REPAIR  November 19, 2013   [facility] :  Dr. Sammuel Hines  . ABDOMINAL AORTIC ANEURYSM REPAIR  09-07-2014   [facility]  . BI-VENTRICULAR IMPLANTABLE CARDIOVERTER DEFIBRILLATOR  (CRT-D)  11/05/2014  . BYPASS GRAFT POPLITEAL TO POPLITEAL Left 06/08/2014   Procedure: BYPASS GRAFT FEMORAL ARTERY TO ABOVE KNEE POPLITEAL;  Surgeon: Rosetta Posner, MD;  Location: [place];  Service: Vascular;  Laterality: Left;  . CARDIAC CATHETERIZATION N/A 10/02/2014   Procedure: Left Heart Cath and Cors/Grafts Angiography;  Surgeon: Belva Crome, MD;  Location: [place] [facility];  Service: Cardiovascular;  Laterality: N/A;  . CARDIAC CATHETERIZATION  "several"  . CATARACT EXTRACTION W/PHACO  03/23/2011   Procedure: CATARACT EXTRACTION PHACO AND INTRAOCULAR LENS PLACEMENT (IOC);  Surgeon: Tonny Branch;  [facility]: [facility];  Service: Ophthalmology;  Laterality: Left;  CDE: 15.99  . CATARACT EXTRACTION W/PHACO  04/17/2011   Procedure: CATARACT EXTRACTION PHACO AND INTRAOCULAR LENS PLACEMENT (IOC);  Surgeon: Tonny Branch;  [facility];  Service: Ophthalmology;  Laterality: Right;  CDE: 23.45  . CORONARY ARTERY BYPASS GRAFT  2001   LIMA to LAD, SVG to diagonal and ramus, SVG to OM, SVG to AM  . DUPUYTREN CONTRACTURE RELEASE Left 2009  . EP IMPLANTABLE DEVICE N/A 11/05/2014   Procedure: BiV ICD Insertion CRT-D;  Surgeon: Evans Lance, MD;  Location: [place] [facility];  Service: Cardiovascular;  Laterality: N/A;  . EP IMPLANTABLE DEVICE N/A 02/24/2015   Procedure: Lead Revision/Repair;  Surgeon: Evans Lance, MD;  Location: [place] [facility];  Service: Cardiovascular;  Laterality: N/A;  . EXTRACORPOREAL SHOCK WAVE LITHOTRIPSY  X 1  . EYE SURGERY    . FOOT SURGERY Left 1963   "gangrene"  . ICD LEAD REMOVAL  02/24/2015  . LOWER EXTREMITY ANGIOGRAM Bilateral 04/28/2015   Procedure: Lower Extremity Angiogram;  Surgeon: Rosetta Posner, MD;  Location: [place] [facility];  Service:  Cardiovascular;  Laterality: Bilateral;  . PERIPHERAL VASCULAR CATHETERIZATION N/A 04/28/2015   Procedure: Abdominal Aortogram;  Surgeon: Rosetta Posner, MD;  Location: [place] [facility];  Service: Cardiovascular;  Laterality: N/A;    Current Outpatient Medications  Medication Sig Dispense Refill  . acetaminophen (TYLENOL) 500 MG tablet Take 1,000 mg by mouth daily as needed for mild pain, moderate pain or headache (pain).     Marland Kitchen albuterol (PROVENTIL) (2.5 MG/3ML) 0.083% nebulizer solution Take 3 mLs (2.5 mg total) by nebulization every 6 (six) hours as needed for wheezing or shortness of breath. 75 mL 12  . allopurinol (ZYLOPRIM) 100 MG tablet TAKE 1 TABLET BY MOUTH ONCE DAILY. 90 tablet 0  . amLODipine (NORVASC) 5 MG tablet TAKE 1 TABLET ONCE DAILY. 90 tablet 3  . aspirin EC 81 MG tablet Take 1 tablet (81 mg total) by mouth daily with breakfast. 30 tablet 3  . budesonide-formoterol (SYMBICORT) 160-4.5 MCG/ACT inhaler Inhale 2 puffs into the lungs 2 (two) times daily. 10.2 g 4  . carvedilol (COREG) 25 MG tablet TAKE 1 TABLET BY MOUTH TWICE DAILY WITH A MEAL. 60 tablet 6  . Cholecalciferol (VITAMIN D-3) 125 MCG (5000 UT) TABS Take 2 tablets by mouth daily.    Marland Kitchen docusate sodium (COLACE) 100 MG capsule Take 1 capsule (100 mg total) by mouth every 12 (twelve) hours. 60 capsule 0  . furosemide (LASIX) 80 MG tablet Take 1 tablet (80 mg total) by mouth 2 (two) times daily. 180 tablet 3  . gabapentin (NEURONTIN) 300 MG capsule TAKE 1 CAPSULE BY MOUTH TWICE DAILY. 60 capsule 3  . glipiZIDE (GLUCOTROL) 10 MG tablet Take 1 tablet (10 mg total) by mouth 2 (two) times daily before a meal. 180 tablet 0  . isosorbide mononitrate (IMDUR) 60 MG 24 hr tablet Take 1 tablet (60 mg total) by mouth 3 (three) times daily. 270 tablet 0  . nitroGLYCERIN (NITROSTAT) 0.4 MG SL tablet PLACE 0.4 MG  UNDER THE TONGUE EVERY 5 MINUTES UP TO 3 DOSES AS NEEDED FOR CHEST PAIN. 25 tablet 3  . potassium chloride (KLOR-CON) 10 MEQ  tablet Take 2 tablets (20 mEq total) by mouth daily. 180 tablet 3  . simvastatin (ZOCOR) 40 MG tablet TAKE 1 TABLET ONCE DAILY. 30 tablet 11  . tamsulosin (FLOMAX) 0.4 MG CAPS capsule Take 1 capsule (0.4 mg total) by mouth daily after supper. 30 capsule 0  . TRULICITY 3 0000000 SOPN INJECT '3MG'$  AS DIRECTED ONCE A WEEK. 2 mL 3  . vitamin B-12 (CYANOCOBALAMIN) 1000 MCG tablet Take 1,000 mcg by mouth daily.     No current facility-administered medications for this visit.   Allergies:  Ace inhibitors, Codeine, Hydromorphone, and Pneumococcal polysaccharide vaccine   ROS: Chronic fatigue.  Physical Exam: VS:  BP (!) 148/68   Pulse 76   Ht '6\' 1"'$  (1.854 m)   Wt 253 lb (114.8 kg)   SpO2 93%   BMI 33.38  kg/m , BMI Body mass index is 33.38 kg/m.  Wt Readings from Last 3 Encounters:  09/15/20 253 lb (114.8 kg)  08/24/20 253 lb 3.2 oz (114.9 kg)  05/26/20 251 lb 3.2 oz (113.9 kg)    General: Patient appears comfortable at rest. HEENT: Conjunctiva and lids normal, wearing a mask. Neck: Supple, no elevated JVP or carotid bruits, no thyromegaly. Lungs: Clear to auscultation, nonlabored breathing at rest. Cardiac: Regular rate and rhythm, no S3 or significant systolic murmur, no pericardial rub. Abdomen: Protuberant, bowel sounds present, no guarding or rebound. Extremities: 1+ lower leg edema, left greater than right.  ECG:  An ECG dated January 2021 was personally reviewed today and demonstrated:  Sinus rhythm with ventricular pacing and atrial sensing.  Recent Labwork: 08/24/2020: ALT 15; AST 15; BUN 19; Creat 2.05; Potassium 4.7; Sodium 141     Component Value Date/Time   CHOL 157 09/09/2019 0919   TRIG 281 (H) 09/09/2019 0919   HDL 29 (L) 09/09/2019 0919   CHOLHDL 5.4 (H) 09/09/2019 0919   VLDL 20 10/16/2014 0715   LDLCALC 91 09/09/2019 0919    Other Studies Reviewed Today:  Echocardiogram 02/13/2018: - Left ventricle: The cavity size was normal. Wall thickness was  increased  in a pattern of mild LVH. Systolic function was mildly  reduced. The estimated ejection fraction was in the range of 45%  to 50%. There is akinesis of the basalinferolateral myocardium.  Doppler parameters are consistent with abnormal left ventricular  relaxation (grade 1 diastolic dysfunction).  - Aortic valve: Mildly to moderately calcified annulus. Trileaflet;  mildly calcified leaflets.  - Mitral valve: Mildly calcified annulus. There was trivial  regurgitation.  - Right ventricle: Pacer wire or catheter noted in right ventricle.  - Right atrium: Central venous pressure (est): 3 mm Hg.  - Atrial septum: No defect or patent foramen ovale was identified.  - Tricuspid valve: There was trivial regurgitation.  - Pericardium, extracardiac: There was no pericardial effusion.  Assessment and Plan:  1.  Chronic combined heart failure with ischemic cardiomyopathy, LVEF 45 to 50% range as of 2019.  We will obtain a follow-up echocardiogram.  Currently on Coreg, Norvasc, Imdur, and Lasix.  Has not been on ARB/ANRI/MRA with renal insufficiency.  2.  St. Jude biventricular ICD in place with follow-up by Dr. Lovena Le.  No device shocks or syncope.  3.  CKD stage IIIb, creatinine 2.05 with normal potassium.  Medication Adjustments/Labs and Tests Ordered: Current medicines are reviewed at length with the patient today.  Concerns regarding medicines are outlined above.   Tests Ordered: Orders Placed This Encounter  Procedures  . EKG 12-Lead  . ECHOCARDIOGRAM COMPLETE    Medication Changes: No orders of the defined types were placed in this encounter.   Disposition:  Follow up 6 months.  Signed, Satira Sark, MD, [facility] 09/15/2020 10:49 AM    [facility] [address], [place], [place] [address] Phone: [phone]; Fax: [phone]

## 2020-09-15 NOTE — Patient Instructions (Signed)
Medication Instructions:  Your physician recommends that you continue on your current medications as directed. Please refer to the Current Medication list given to you today.  *If you need a refill on your cardiac medications before your next appointment, please call your pharmacy*   Lab Work: None If you have labs (blood work) drawn today and your tests are completely normal, you will receive your results only by: MyChart Message (if you have MyChart) OR A paper copy in the mail If you have any lab test that is abnormal or we need to change your treatment, we will call you to review the results.   Testing/Procedures: Your physician has requested that you have an echocardiogram. Echocardiography is a painless test that uses sound waves to create images of your heart. It provides your doctor with information about the size and shape of your heart and how well your heart's chambers and valves are working. This procedure takes approximately one hour. There are no restrictions for this procedure.    Follow-Up: At [facility], you and your health needs are our priority.  As part of our continuing mission to provide you with exceptional heart care, we have created designated Provider Care Teams.  These Care Teams include your primary Cardiologist (physician) and Advanced Practice Providers (APPs -  Physician Assistants and Nurse Practitioners) who all work together to provide you with the care you need, when you need it.  We recommend signing up for the patient portal called "MyChart".  Sign up information is provided on this After Visit Summary.  MyChart is used to connect with patients for Virtual Visits (Telemedicine).  Patients are able to view lab/test results, encounter notes, upcoming appointments, etc.  Non-urgent messages can be sent to your provider as well.   To learn more about what you can do with MyChart, go to [email or web address].    Your next appointment:   6  month(s)  The format for your next appointment:   In Person  Provider:   Samuel McDowell, MD   Other Instructions    

## 2020-09-21 ENCOUNTER — Other Ambulatory Visit: Payer: Self-pay

## 2020-09-21 ENCOUNTER — Ambulatory Visit (INDEPENDENT_AMBULATORY_CARE_PROVIDER_SITE_OTHER): Payer: Medicare Other | Admitting: Internal Medicine

## 2020-09-21 ENCOUNTER — Encounter: Payer: Self-pay | Admitting: Internal Medicine

## 2020-09-21 VITALS — BP 132/76 | HR 78 | Ht 73.0 in | Wt 251.8 lb

## 2020-09-21 DIAGNOSIS — I5022 Chronic systolic (congestive) heart failure: Secondary | ICD-10-CM | POA: Diagnosis not present

## 2020-09-21 DIAGNOSIS — I447 Left bundle-branch block, unspecified: Secondary | ICD-10-CM | POA: Diagnosis not present

## 2020-09-21 NOTE — Patient Instructions (Signed)
Medication Instructions:  Your physician recommends that you continue on your current medications as directed. Please refer to the Current Medication list given to you today.  *If you need a refill on your cardiac medications before your next appointment, please call your pharmacy*   Lab Work: NONE   If you have labs (blood work) drawn today and your tests are completely normal, you will receive your results only by: . MyChart Message (if you have MyChart) OR . A paper copy in the mail If you have any lab test that is abnormal or we need to change your treatment, we will call you to review the results.   Testing/Procedures: NONE    Follow-Up: At [facility], you and your health needs are our priority.  As part of our continuing mission to provide you with exceptional heart care, we have created designated Provider Care Teams.  These Care Teams include your primary Cardiologist (physician) and Advanced Practice Providers (APPs -  Physician Assistants and Nurse Practitioners) who all work together to provide you with the care you need, when you need it.  We recommend signing up for the patient portal called "MyChart".  Sign up information is provided on this After Visit Summary.  MyChart is used to connect with patients for Virtual Visits (Telemedicine).  Patients are able to view lab/test results, encounter notes, upcoming appointments, etc.  Non-urgent messages can be sent to your provider as well.   To learn more about what you can do with MyChart, go to [email or web address].    Your next appointment:   1 year(s)  The format for your next appointment:   In Person  Provider:   Gregg Taylor, MD   Other Instructions Thank you for choosing [facility]!    

## 2020-09-21 NOTE — Progress Notes (Signed)
HPI Mr .[name] returns today for ongoing evaluation of chronic systolic heart failure s/p biv ICD insertion. He denies dietary indiscretion. He has lost 8 lbs since I saw him last. No chest pain. He has class 2 CHF. He is biv pacing 95%. No edema.  Allergies  Allergen Reactions  . Ace Inhibitors Swelling and Anaphylaxis    Throat and lips swelled; ended up in ED  . Codeine Other (See Comments) and Anxiety     Delirium, hallucinations REACTION: delirium REACTION: delirium Other reaction(s): Other (See Comments)  delirium Pt states he does things that he can't control, acts irrationally. Had [place] flu at the time.   Marland Kitchen Hydromorphone Nausea And Vomiting  . Pneumococcal Polysaccharide Vaccine Rash     Current Outpatient Medications  Medication Sig Dispense Refill  . acetaminophen (TYLENOL) 500 MG tablet Take 1,000 mg by mouth daily as needed for mild pain, moderate pain or headache (pain).     Marland Kitchen albuterol (PROVENTIL) (2.5 MG/3ML) 0.083% nebulizer solution Take 3 mLs (2.5 mg total) by nebulization every 6 (six) hours as needed for wheezing or shortness of breath. 75 mL 12  . allopurinol (ZYLOPRIM) 100 MG tablet TAKE 1 TABLET BY MOUTH ONCE DAILY. 90 tablet 0  . amLODipine (NORVASC) 5 MG tablet TAKE 1 TABLET ONCE DAILY. 90 tablet 3  . aspirin EC 81 MG tablet Take 1 tablet (81 mg total) by mouth daily with breakfast. 30 tablet 3  . budesonide-formoterol (SYMBICORT) 160-4.5 MCG/ACT inhaler Inhale 2 puffs into the lungs 2 (two) times daily. 10.2 g 4  . carvedilol (COREG) 25 MG tablet TAKE 1 TABLET BY MOUTH TWICE DAILY WITH A MEAL. 60 tablet 6  . Cholecalciferol (VITAMIN D-3) 125 MCG (5000 UT) TABS Take 2 tablets by mouth daily.    Marland Kitchen docusate sodium (COLACE) 100 MG capsule Take 1 capsule (100 mg total) by mouth every 12 (twelve) hours. 60 capsule 0  . furosemide (LASIX) 80 MG tablet Take 1 tablet (80 mg total) by mouth 2 (two) times daily. 180 tablet 3  . gabapentin (NEURONTIN)  300 MG capsule TAKE 1 CAPSULE BY MOUTH TWICE DAILY. 60 capsule 3  . glipiZIDE (GLUCOTROL) 10 MG tablet Take 1 tablet (10 mg total) by mouth 2 (two) times daily before a meal. 180 tablet 0  . isosorbide mononitrate (IMDUR) 60 MG 24 hr tablet Take 1 tablet (60 mg total) by mouth 3 (three) times daily. 270 tablet 0  . nitroGLYCERIN (NITROSTAT) 0.4 MG SL tablet PLACE 0.4 MG  UNDER THE TONGUE EVERY 5 MINUTES UP TO 3 DOSES AS NEEDED FOR CHEST PAIN. 25 tablet 3  . potassium chloride (KLOR-CON) 10 MEQ tablet Take 2 tablets (20 mEq total) by mouth daily. 180 tablet 3  . simvastatin (ZOCOR) 40 MG tablet TAKE 1 TABLET ONCE DAILY. 30 tablet 11  . TRULICITY 3 0000000 SOPN INJECT '3MG'$  AS DIRECTED ONCE A WEEK. 2 mL 3  . vitamin B-12 (CYANOCOBALAMIN) 1000 MCG tablet Take 1,000 mcg by mouth in the morning and at bedtime.     No current facility-administered medications for this visit.     Past Medical History:  Diagnosis Date  . AAA (abdominal aortic aneurysm) ([place])    Followed by Dr. Donnetta Hutching  . AICD (automatic cardioverter/defibrillator) present   . Angioedema    Secondary to ACE inhibitor  . Arthritis   . Bell palsy   . Carotid artery disease ([place])   . CHF (congestive heart failure) ([place])   .  Coronary atherosclerosis of native coronary artery    Multivessel status post CABG  . Essential hypertension, benign   . Gout   . Hernia of abdominal wall   . Hypercholesteremia   . Ischemic cardiomyopathy    LVEF 45-50% 11/2011  . Migraines   . Morbid obesity ([place]) 02/12/2019  . Myocardial infarction ([place])   . Nephrolithiasis   . PAD (peripheral artery disease) ([place])    Followed by Dr. Donnetta Hutching  . Sleep apnea   . Type 2 diabetes mellitus (H[facility])     ROS:   All systems reviewed and negative except as noted in the HPI.   Past Surgical History:  Procedure Laterality Date  . ABDOMINAL AORTIC ANEURYSM REPAIR  November 19, 2013   [facility] :  Dr. Sammuel Hines  . ABDOMINAL AORTIC ANEURYSM REPAIR  09-07-2014   [facility]  . BI-VENTRICULAR IMPLANTABLE CARDIOVERTER DEFIBRILLATOR  (CRT-D)  11/05/2014  . BYPASS GRAFT POPLITEAL TO POPLITEAL Left 06/08/2014   Procedure: BYPASS GRAFT FEMORAL ARTERY TO ABOVE KNEE POPLITEAL;  Surgeon: Rosetta Posner, MD;  Location: [place];  Service: Vascular;  Laterality: Left;  . CARDIAC CATHETERIZATION N/A 10/02/2014   Procedure: Left Heart Cath and Cors/Grafts Angiography;  Surgeon: Belva Crome, MD;  Location: [place] [facility];  Service: Cardiovascular;  Laterality: N/A;  . CARDIAC CATHETERIZATION  "several"  . CATARACT EXTRACTION W/PHACO  03/23/2011   Procedure: CATARACT EXTRACTION PHACO AND INTRAOCULAR LENS PLACEMENT (IOC);  Surgeon: Tonny Branch;  Location: [facility];  Service: Ophthalmology;  Laterality: Left;  CDE: 15.99  . CATARACT EXTRACTION W/PHACO  04/17/2011   Procedure: CATARACT EXTRACTION PHACO AND INTRAOCULAR LENS PLACEMENT (IOC);  Surgeon: Tonny Branch;  Location: [facility];  Service: Ophthalmology;  Laterality: Right;  CDE: 23.45  . CORONARY ARTERY BYPASS GRAFT  2001   LIMA to LAD, SVG to diagonal and ramus, SVG to OM, SVG to AM  . DUPUYTREN CONTRACTURE RELEASE Left 2009  . EP IMPLANTABLE DEVICE N/A 11/05/2014   Procedure: BiV ICD Insertion CRT-D;  Surgeon: Evans Lance, MD;  Location: [place] [facility];  Service: Cardiovascular;  Laterality: N/A;  . EP IMPLANTABLE DEVICE N/A 02/24/2015   Procedure: Lead Revision/Repair;  Surgeon: Evans Lance, MD;  Location: [place] [facility];  Service: Cardiovascular;  Laterality: N/A;  . EXTRACORPOREAL SHOCK WAVE LITHOTRIPSY  X 1  . EYE SURGERY    . FOOT SURGERY Left 1963   "gangrene"  . ICD LEAD REMOVAL  02/24/2015  . LOWER EXTREMITY ANGIOGRAM Bilateral 04/28/2015   Procedure: Lower Extremity Angiogram;  Surgeon: Rosetta Posner, MD;  Location: [place] [facility];  Service: Cardiovascular;  Laterality: Bilateral;  . PERIPHERAL VASCULAR CATHETERIZATION N/A 04/28/2015   Procedure: Abdominal Aortogram;  Surgeon: Rosetta Posner, MD;   Location: [place] [facility];  Service: Cardiovascular;  Laterality: N/A;     Family History  Problem Relation Age of Onset  . Diabetes Mother        Type  I   . Varicose Veins Mother   . Heart disease Father 70       AAA  . AAA (abdominal aortic aneurysm) Father   . Hyperlipidemia Father   . Hypertension Father      Social History   Socioeconomic History  . Marital status: Divorced    Spouse name: Not on file  . Number of children: Not on file  . Years of education: Not on file  . Highest education level: Not  on file  Occupational History  . Not on file  Tobacco Use  . Smoking status: Former Smoker    Packs/day: 1.00    Years: 25.00    Pack years: 25.00    Types: Cigarettes    Start date: 02/28/1979    Quit date: 05/23/2007    Years since quitting: 13.3  . Smokeless tobacco: Never Used  Vaping Use  . Vaping Use: Former  Substance and Sexual Activity  . Alcohol use: No    Alcohol/week: 0.0 standard drinks  . Drug use: No  . Sexual activity: Never  Other Topics Concern  . 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   Social Determinants of Health   Financial Resource Strain: Not on file  Food Insecurity: Not on file  Transportation Needs: Not on file  Physical Activity: Not on file  Stress: Not on file  Social Connections: Not on file  Intimate Partner Violence: Not on file     BP 132/76   Pulse 78   Ht '6\' 1"'$  (1.854 m)   Wt 251 lb 12.8 oz (114.2 kg)   SpO2 94%   BMI 33.22 kg/m   Physical Exam:  Well appearing 75 yo man, NAD HEENT: Unremarkable Neck:  6 cm JVD, no thyromegally Lymphatics:  No adenopathy Back:  No CVA tenderness Lungs:  Clear with no wheezes HEART:  Regular rate rhythm, no murmurs, no rubs, no clicks Abd:  soft, positive bowel sounds, no organomegally, no rebound, no guarding Ext:  2 plus pulses, no edema, no cyanosis, no clubbing Skin:  No rashes no nodules Neuro:  CN II  through XII intact, motor grossly intact   DEVICE  Normal device function.  See PaceArt for details.   Assess/Plan: 1. Chronic systolic heart failure - his symptoms are class 2. No change in medical therapy. 2. ICD - his LV threshold is up a little. We have reprogrammed to optimize longevity. We will need to consider a new LV lead when he returns in a year. He is just over a year away from [facility].  3. Obesity - I asked him to work on weight loss.  4. HTN -his bp is well controlled. We will follow.  Carleene Overlie Issiac Jamar,MD

## 2020-09-24 ENCOUNTER — Other Ambulatory Visit (INDEPENDENT_AMBULATORY_CARE_PROVIDER_SITE_OTHER): Payer: Self-pay | Admitting: Internal Medicine

## 2020-09-24 DIAGNOSIS — E118 Type 2 diabetes mellitus with unspecified complications: Secondary | ICD-10-CM

## 2020-09-30 ENCOUNTER — Telehealth: Payer: Self-pay

## 2020-09-30 NOTE — Telephone Encounter (Signed)
Attempted to contact patient to assist with sending a manual transmission. No answer, LMOVM.

## 2020-09-30 NOTE — Telephone Encounter (Signed)
The patient states his heart rate has been all over the place since his last visit. He has agreed to send a manual transmission for the nurse to review. I told him once we receive it the nurse will give him a call back.

## 2020-10-01 NOTE — Telephone Encounter (Signed)
Transmission received. Presenting rhythm appears AS/BVP with frequent PVC's. Attempted to contact patient to assess. No answer, LMOVM.

## 2020-10-04 NOTE — Telephone Encounter (Signed)
Patient returning phone call. Patient states he felt like his heart was "acting up" last week but now feeling better. Reports of generalized weakness. Denies acute symptoms for fluid retention. Reports of increased fatigue and intermittent chest pains throughout the weekend that would last about 5 minutes at a time then resolve.   Manual transmission requested to assess presenting and any new alerts over the weekend. Presenting ~ AS/BVP w/ PVC's 76 bpm. No new alerts logged. Patient states he has not had any complaints of chest pain or heart fluttering today. Advised patient I will forward to Dr. Lovena Le in regard for frequent PVC's and 88% BVP, which was 95% on 09/21/20 at last OV. PVC counter has increased from 2.8% to 6.1% from 5/3 - 5/16.   Also routing to Dr. Domenic Polite for any changes since patient was recently seen in office on 09/15/20.  ECHO scheduled for 10/22/20.   ED precautions discussed with patient with verbal understanding.

## 2020-10-05 NOTE — Telephone Encounter (Signed)
No change in treatment

## 2020-10-20 ENCOUNTER — Telehealth: Payer: Self-pay

## 2020-10-20 NOTE — Telephone Encounter (Signed)
Carelink alert received for BP< limit, at 88% today which has been chronic for patient. Today PVC burden is at 6.3% with 64 AMS episodes since 10/13/20 all < 1 min. Hipaa compliant VM message left with patient requesting call back to [phone].

## 2020-10-22 ENCOUNTER — Ambulatory Visit (HOSPITAL_COMMUNITY)
Admission: RE | Admit: 2020-10-22 | Discharge: 2020-10-22 | Disposition: A | Discharge status: Home / Self Care | Payer: Medicare Other | Source: Ambulatory Visit | Attending: Cardiology | Admitting: Cardiology

## 2020-10-22 ENCOUNTER — Other Ambulatory Visit: Payer: Self-pay

## 2020-10-22 DIAGNOSIS — I255 Ischemic cardiomyopathy: Secondary | ICD-10-CM | POA: Diagnosis not present

## 2020-10-22 LAB — ECHOCARDIOGRAM COMPLETE
Area-P 1/2: 2.5 cm2
Calc EF: 50.9 %
S' Lateral: 5.18 cm
Single Plane A2C EF: 57.4 %
Single Plane A4C EF: 39.6 %

## 2020-10-22 NOTE — Progress Notes (Signed)
*  PRELIMINARY RESULTS* Echocardiogram 2D Echocardiogram has been performed.  Leavy Cella 10/22/2020, 1:50 PM

## 2020-10-25 ENCOUNTER — Telehealth: Payer: Self-pay | Admitting: Cardiology

## 2020-10-25 ENCOUNTER — Other Ambulatory Visit (INDEPENDENT_AMBULATORY_CARE_PROVIDER_SITE_OTHER): Payer: Self-pay | Admitting: Internal Medicine

## 2020-10-25 ENCOUNTER — Telehealth (INDEPENDENT_AMBULATORY_CARE_PROVIDER_SITE_OTHER): Payer: Self-pay

## 2020-10-25 ENCOUNTER — Other Ambulatory Visit (INDEPENDENT_AMBULATORY_CARE_PROVIDER_SITE_OTHER): Payer: Self-pay | Admitting: Nurse Practitioner

## 2020-10-25 ENCOUNTER — Other Ambulatory Visit: Payer: Self-pay | Admitting: Cardiology

## 2020-10-25 DIAGNOSIS — E118 Type 2 diabetes mellitus with unspecified complications: Secondary | ICD-10-CM

## 2020-10-25 DIAGNOSIS — I5032 Chronic diastolic (congestive) heart failure: Secondary | ICD-10-CM

## 2020-10-25 NOTE — Telephone Encounter (Signed)
Pt called and notified of echo results

## 2020-10-25 NOTE — Telephone Encounter (Signed)
Patient reports of fatigue over the past year but nothing new within the past 2-3 weeks. Denies palpitations, shortness of breath or chest pain. Advised patient if he can think of any changes to please call. We will follow up after Dr. Lovena Le has response.

## 2020-10-25 NOTE — Telephone Encounter (Signed)
Patient calling back to get echo results

## 2020-11-04 DIAGNOSIS — G4733 Obstructive sleep apnea (adult) (pediatric): Secondary | ICD-10-CM | POA: Diagnosis not present

## 2020-11-23 ENCOUNTER — Ambulatory Visit (INDEPENDENT_AMBULATORY_CARE_PROVIDER_SITE_OTHER): Payer: Medicare Other

## 2020-11-23 DIAGNOSIS — I5022 Chronic systolic (congestive) heart failure: Secondary | ICD-10-CM | POA: Diagnosis not present

## 2020-11-23 DIAGNOSIS — I255 Ischemic cardiomyopathy: Secondary | ICD-10-CM

## 2020-11-24 ENCOUNTER — Other Ambulatory Visit (INDEPENDENT_AMBULATORY_CARE_PROVIDER_SITE_OTHER): Payer: Self-pay | Admitting: Internal Medicine

## 2020-11-24 DIAGNOSIS — I5032 Chronic diastolic (congestive) heart failure: Secondary | ICD-10-CM

## 2020-11-24 LAB — CUP PACEART REMOTE DEVICE CHECK
Battery Remaining Longevity: 14 mo
Battery Remaining Percentage: 20 %
Battery Voltage: 2.78 V
Brady Statistic AP VP Percent: 7.5 %
Brady Statistic AP VS Percent: 1 %
Brady Statistic AS VP Percent: 85 %
Brady Statistic AS VS Percent: 3.3 %
Brady Statistic RA Percent Paced: 3.1 %
Date Time Interrogation Session: 20220705021046
HighPow Impedance: 75 Ohm
HighPow Impedance: 75 Ohm
Implantable Lead Implant Date: 20160616
Implantable Lead Implant Date: 20161005
Implantable Lead Implant Date: 20161005
Implantable Lead Location: 753858
Implantable Lead Location: 753859
Implantable Lead Location: 753860
Implantable Lead Model: 7122
Implantable Pulse Generator Implant Date: 20160616
Lead Channel Impedance Value: 410 Ohm
Lead Channel Impedance Value: 560 Ohm
Lead Channel Impedance Value: 810 Ohm
Lead Channel Pacing Threshold Amplitude: 0.75 V
Lead Channel Pacing Threshold Amplitude: 0.875 V
Lead Channel Pacing Threshold Amplitude: 2.25 V
Lead Channel Pacing Threshold Pulse Width: 0.5 ms
Lead Channel Pacing Threshold Pulse Width: 0.5 ms
Lead Channel Pacing Threshold Pulse Width: 1 ms
Lead Channel Sensing Intrinsic Amplitude: 11.6 mV
Lead Channel Sensing Intrinsic Amplitude: 2.5 mV
Lead Channel Setting Pacing Amplitude: 1.875
Lead Channel Setting Pacing Amplitude: 2 V
Lead Channel Setting Pacing Amplitude: 3 V
Lead Channel Setting Pacing Pulse Width: 0.5 ms
Lead Channel Setting Pacing Pulse Width: 1 ms
Lead Channel Setting Sensing Sensitivity: 0.5 mV
Pulse Gen Serial Number: 7288352

## 2020-12-01 ENCOUNTER — Ambulatory Visit (INDEPENDENT_AMBULATORY_CARE_PROVIDER_SITE_OTHER): Payer: Medicare Other | Admitting: Nurse Practitioner

## 2020-12-07 ENCOUNTER — Encounter (INDEPENDENT_AMBULATORY_CARE_PROVIDER_SITE_OTHER): Payer: Self-pay | Admitting: Nurse Practitioner

## 2020-12-07 ENCOUNTER — Telehealth (INDEPENDENT_AMBULATORY_CARE_PROVIDER_SITE_OTHER): Payer: Self-pay | Admitting: Nurse Practitioner

## 2020-12-07 ENCOUNTER — Ambulatory Visit (INDEPENDENT_AMBULATORY_CARE_PROVIDER_SITE_OTHER): Payer: Medicare Other | Admitting: Nurse Practitioner

## 2020-12-07 ENCOUNTER — Other Ambulatory Visit: Payer: Self-pay

## 2020-12-07 VITALS — BP 140/60 | HR 80 | Temp 96.6°F | Ht 73.0 in | Wt 251.2 lb

## 2020-12-07 DIAGNOSIS — E1122 Type 2 diabetes mellitus with diabetic chronic kidney disease: Secondary | ICD-10-CM

## 2020-12-07 DIAGNOSIS — G4733 Obstructive sleep apnea (adult) (pediatric): Secondary | ICD-10-CM

## 2020-12-07 DIAGNOSIS — R5383 Other fatigue: Secondary | ICD-10-CM

## 2020-12-07 DIAGNOSIS — I5022 Chronic systolic (congestive) heart failure: Secondary | ICD-10-CM

## 2020-12-07 DIAGNOSIS — Z1211 Encounter for screening for malignant neoplasm of colon: Secondary | ICD-10-CM

## 2020-12-07 DIAGNOSIS — N184 Chronic kidney disease, stage 4 (severe): Secondary | ICD-10-CM | POA: Diagnosis not present

## 2020-12-07 NOTE — Progress Notes (Signed)
Subjective:  Patient ID: [name], male    DOB: 1945/06/21  Age: 75 y.o. MRN: [id number]  CC:  Chief Complaint  Patient presents with   Fatigue   Congestive Heart Failure   Diabetes      HPI  This patient arrives today for the above.  His main concern is fatigue and weakness.  He tells me he has been experiencing with this for the last couple of years and he does not feel he has a good understanding of the reason for his fatigue or weakness.  He does have a significant past medical history including congestive heart failure, type 2 diabetes, coronary artery disease, COPD, obstructive sleep apnea, and chronic kidney disease stage IV.  He used to see a nephrologist in the past but this nephrologist is left the area and he has not had follow-up with a new one.  This referral has been made in the past but for some reason its never been scheduled.  He also follows with cardiology and electrophysiologist because he does have an ICD in place.  Last A1c was 7.1 and he continues on glipizide.  He reports some hypoglycemic episodes but they do not appear to be quite frequent and they seem to be very mild.  He will check his blood sugar when he feels unwell and usually the range will be in the 70s and he will eat a small snack which will improve the symptoms.  He tells me that he believes his CPAP machine is old and he would like to be reevaluated for his obstructive sleep apnea so that he can get a new machine.  Past Medical History:  Diagnosis Date   AAA (abdominal aortic aneurysm) ([place])    Followed by Dr. Donnetta Hutching   AICD (automatic cardioverter/defibrillator) present    Angioedema    Secondary to ACE inhibitor   Arthritis    Bell palsy    Carotid artery disease (H[facility])    CHF (congestive heart failure) (HCC)    Coronary atherosclerosis of native coronary artery    Multivessel status post CABG   Essential hypertension, benign    Gout    Hernia of abdominal wall     Hypercholesteremia    Ischemic cardiomyopathy    LVEF 45-50% 11/2011   Migraines    Morbid obesity ([place]) 02/12/2019   Myocardial infarction ([place])    Nephrolithiasis    PAD (peripheral artery disease) ([place])    Followed by Dr. 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   Social History   Tobacco Use   Smoking status: Former    Packs/day: 1.00    Years: 25.00    Pack years: 25.00    Types: Cigarettes    Start date: 02/28/1979    Quit date: 05/23/2007    Years since quitting: 13.5   Smokeless tobacco: Never  Substance Use Topics   Alcohol use: No    Alcohol/week: 0.0 standard drinks  Current Meds  Medication Sig   acetaminophen (TYLENOL) 500 MG tablet Take 1,000 mg by mouth daily as needed for mild pain, moderate pain or headache (pain).    albuterol (PROVENTIL) (2.5 MG/3ML) 0.083% nebulizer solution Take 3 mLs (2.5 mg total) by nebulization every 6 (six) hours as needed for wheezing or shortness of breath.   allopurinol (ZYLOPRIM) 100 MG tablet TAKE 1 TABLET BY MOUTH ONCE DAILY.   amLODipine (NORVASC) 5 MG tablet TAKE 1 TABLET ONCE DAILY.   aspirin EC 81 MG tablet Take 1 tablet (81 mg total) by mouth daily with breakfast.   budesonide-formoterol (SYMBICORT) 160-4.5 MCG/ACT inhaler Inhale 2 puffs into the lungs 2 (two) times daily.   carvedilol (COREG) 25 MG tablet TAKE 1 TABLET BY MOUTH TWICE DAILY WITH A MEAL.   Cholecalciferol (VITAMIN D-3) 125 MCG (5000 UT) TABS Take 2 tablets by mouth daily.   docusate sodium (COLACE) 100 MG capsule Take 1 capsule (100 mg total) by mouth every 12 (twelve) hours.   furosemide  (LASIX) 80 MG tablet TAKE (1) TABLET TWICE DAILY.   gabapentin (NEURONTIN) 300 MG capsule TAKE 1 CAPSULE BY MOUTH TWICE DAILY.   glipiZIDE (GLUCOTROL) 10 MG tablet Take 1 tablet (10 mg total) by mouth 2 (two) times daily before a meal.   isosorbide mononitrate (IMDUR) 60 MG 24 hr tablet TAKE 1 TABLET BY MOUTH 3 TIMES DAILY.   nitroGLYCERIN (NITROSTAT) 0.4 MG SL tablet PLACE 0.4 MG  UNDER THE TONGUE EVERY 5 MINUTES UP TO 3 DOSES AS NEEDED FOR CHEST PAIN.   potassium chloride (KLOR-CON) 10 MEQ tablet TAKE 2 TABLETS BY MOUTH DAILY.   simvastatin (ZOCOR) 40 MG tablet TAKE 1 TABLET ONCE DAILY.   vitamin B-12 (CYANOCOBALAMIN) 1000 MCG tablet Take 1,000 mcg by mouth daily.    ROS:  Review of Systems  Constitutional:  Positive for malaise/fatigue.  Respiratory:  Positive for shortness of breath (on exertion).   Cardiovascular:  Negative for chest pain.    Objective:   Today's Vitals: BP 140/60   Pulse 80   Temp (!) 96.6 F (35.9 C)   Ht 6' 1"  (1.854 m)   Wt 251 lb 3.2 oz (113.9 kg)   SpO2 92%   BMI 33.14 kg/m  Vitals with BMI 12/07/2020 09/21/2020 09/15/2020  Height 6' 1"  6' 1"  6' 1"   Weight 251 lbs 3 oz 251 lbs 13 oz 253 lbs  BMI 33.15 16.10 96.04  Systolic 540 981 191  Diastolic 60 76 68  Pulse 80 78 76     Physical Exam Vitals reviewed.  Constitutional:      Appearance: Normal appearance. He is obese.  HENT:     Head: Normocephalic and atraumatic.  Cardiovascular:     Rate and Rhythm: Normal rate and regular rhythm.  Pulmonary:     Effort: Pulmonary effort is normal.     Breath sounds: Normal breath sounds.  Musculoskeletal:     Cervical back: Neck supple.  Skin:    General: Skin is warm and dry.  Neurological:     Mental Status: He is alert and oriented to person, place, and time.  Psychiatric:        Mood and Affect: Mood normal.        Behavior: Behavior normal.        Thought Content: Thought content normal.        Judgment: Judgment normal.          Assessment and Plan   1. Diabetes mellitus with  stage 4 chronic kidney disease ([place])   2. OSA (obstructive sleep apnea)   3. Chronic systolic heart failure ([place])   4. Fatigue, unspecified type   5. Colon cancer screening      Plan: 1.  Will order A1c today we will also refer back to nephrology for further assistance with managing his chronic kidney disease. 2.  We will refer him to sleep medicine specialist so he can undergo additional sleep study and make sure his CPAP settings are accurate and that his CPAP machine is working properly. 3.  He will continue to follow-up with his cardiologist as scheduled.  He will continue taking medications as prescribed.  Weight stable. 4., 5.  We will collect additional blood work today for further evaluation including thyroid panel and CBC as well as CMP.  I also recommended he make sure he is up-to-date with colon cancer screenings have referred him to gastroenterology for this.   Tests ordered Orders Placed This Encounter  Procedures   CMP with eGFR([facility])   Hemoglobin A1c   CBC   TSH   T3, Free   T4, Free   Ambulatory referral to Nephrology   Ambulatory referral to Sleep Studies   Ambulatory referral to Gastroenterology      No orders of the defined types were placed in this encounter.   Patient to follow-up in 1 month for close monitoring or sooner as needed.  Ailene Ards, NP

## 2020-12-07 NOTE — Telephone Encounter (Signed)
Ordering referral to nephrology, gastroenterology, and sleep medicine for sleep study. Patient would prefer to stay in [place] or [place] if possible.

## 2020-12-08 ENCOUNTER — Telehealth (INDEPENDENT_AMBULATORY_CARE_PROVIDER_SITE_OTHER): Payer: Self-pay

## 2020-12-08 DIAGNOSIS — G4733 Obstructive sleep apnea (adult) (pediatric): Secondary | ICD-10-CM

## 2020-12-08 LAB — COMPLETE METABOLIC PANEL WITH GFR
AG Ratio: 1.3 (calc) (ref 1.0–2.5)
ALT: 17 U/L (ref 9–46)
AST: 17 U/L (ref 10–35)
Albumin: 4.1 g/dL (ref 3.6–5.1)
Alkaline phosphatase (APISO): 61 U/L (ref 35–144)
BUN/Creatinine Ratio: 13 (calc) (ref 6–22)
BUN: 26 mg/dL — ABNORMAL HIGH (ref 7–25)
CO2: 33 mmol/L — ABNORMAL HIGH (ref 20–32)
Calcium: 10.1 mg/dL (ref 8.6–10.3)
Chloride: 101 mmol/L (ref 98–110)
Creat: 2 mg/dL — ABNORMAL HIGH (ref 0.70–1.28)
Globulin: 3.1 g/dL (calc) (ref 1.9–3.7)
Glucose, Bld: 122 mg/dL (ref 65–139)
Potassium: 4.8 mmol/L (ref 3.5–5.3)
Sodium: 142 mmol/L (ref 135–146)
Total Bilirubin: 0.4 mg/dL (ref 0.2–1.2)
Total Protein: 7.2 g/dL (ref 6.1–8.1)
eGFR: 35 mL/min/{1.73_m2} — ABNORMAL LOW (ref >=60)

## 2020-12-08 LAB — T4, FREE: Free T4: 1.1 ng/dL (ref 0.8–1.8)

## 2020-12-08 LAB — CBC
HCT: 43 % (ref 38.5–50.0)
Hemoglobin: 14.7 g/dL (ref 13.2–17.1)
MCH: 32 pg (ref 27.0–33.0)
MCHC: 34.2 g/dL (ref 32.0–36.0)
MCV: 93.7 fL (ref 80.0–100.0)
MPV: 10.6 fL (ref 7.5–12.5)
Platelets: 320 10*3/uL (ref 140–400)
RBC: 4.59 10*6/uL (ref 4.20–5.80)
RDW: 13.1 % (ref 11.0–15.0)
WBC: 9.3 10*3/uL (ref 3.8–10.8)

## 2020-12-08 LAB — TSH: TSH: 1.53 mIU/L (ref 0.40–4.50)

## 2020-12-08 LAB — HEMOGLOBIN A1C
Hgb A1c MFr Bld: 7.1 % of total Hgb — ABNORMAL HIGH (ref <5.7)
Mean Plasma Glucose: 157 mg/dL
eAG (mmol/L): 8.7 mmol/L

## 2020-12-08 LAB — T3, FREE: T3, Free: 3.7 pg/mL (ref 2.3–4.2)

## 2020-12-08 NOTE — Telephone Encounter (Signed)
Patient called and left a detailed voice message and stated that his CPAP machine is 74 yrs old and he needs another one.

## 2020-12-08 NOTE — Telephone Encounter (Signed)
Called patient and gave him the message from Judson Roch. Patient verbalized an understanding and will check back if he has not heard from referral yet.

## 2020-12-08 NOTE — Telephone Encounter (Signed)
I did order referral to sleep medicine yesterday, but he may get into pulmonology sooner. I will place this order so that he can have repeat sleep study ordered, interpreted, and get recommendations are correct CPAP settings/get new CPAP machine ordered.

## 2020-12-09 ENCOUNTER — Encounter (INDEPENDENT_AMBULATORY_CARE_PROVIDER_SITE_OTHER): Payer: Self-pay | Admitting: *Deleted

## 2020-12-10 NOTE — Progress Notes (Signed)
Remote ICD transmission.   

## 2020-12-16 ENCOUNTER — Encounter (INDEPENDENT_AMBULATORY_CARE_PROVIDER_SITE_OTHER): Payer: Self-pay | Admitting: Internal Medicine

## 2020-12-23 ENCOUNTER — Other Ambulatory Visit (INDEPENDENT_AMBULATORY_CARE_PROVIDER_SITE_OTHER): Payer: Self-pay | Admitting: Internal Medicine

## 2020-12-23 DIAGNOSIS — I5032 Chronic diastolic (congestive) heart failure: Secondary | ICD-10-CM

## 2020-12-31 ENCOUNTER — Other Ambulatory Visit: Payer: Self-pay | Admitting: Nephrology

## 2020-12-31 ENCOUNTER — Other Ambulatory Visit (HOSPITAL_COMMUNITY): Payer: Self-pay | Admitting: Nephrology

## 2020-12-31 DIAGNOSIS — R809 Proteinuria, unspecified: Secondary | ICD-10-CM

## 2020-12-31 DIAGNOSIS — E1122 Type 2 diabetes mellitus with diabetic chronic kidney disease: Secondary | ICD-10-CM

## 2020-12-31 DIAGNOSIS — E1129 Type 2 diabetes mellitus with other diabetic kidney complication: Secondary | ICD-10-CM

## 2021-01-05 ENCOUNTER — Ambulatory Visit (HOSPITAL_COMMUNITY)
Admission: RE | Admit: 2021-01-05 | Discharge: 2021-01-05 | Disposition: A | Discharge status: Home / Self Care | Payer: Medicare Other | Source: Ambulatory Visit | Attending: Nephrology | Admitting: Nephrology

## 2021-01-05 ENCOUNTER — Other Ambulatory Visit: Payer: Self-pay

## 2021-01-05 DIAGNOSIS — E1122 Type 2 diabetes mellitus with diabetic chronic kidney disease: Secondary | ICD-10-CM | POA: Diagnosis present

## 2021-01-05 DIAGNOSIS — R809 Proteinuria, unspecified: Secondary | ICD-10-CM | POA: Diagnosis present

## 2021-01-05 DIAGNOSIS — E1129 Type 2 diabetes mellitus with other diabetic kidney complication: Secondary | ICD-10-CM | POA: Insufficient documentation

## 2021-01-13 ENCOUNTER — Ambulatory Visit (INDEPENDENT_AMBULATORY_CARE_PROVIDER_SITE_OTHER): Payer: Medicare Other | Admitting: Nurse Practitioner

## 2021-01-13 ENCOUNTER — Other Ambulatory Visit: Payer: Self-pay

## 2021-01-13 ENCOUNTER — Other Ambulatory Visit (INDEPENDENT_AMBULATORY_CARE_PROVIDER_SITE_OTHER): Payer: Self-pay | Admitting: Nurse Practitioner

## 2021-01-13 ENCOUNTER — Encounter (INDEPENDENT_AMBULATORY_CARE_PROVIDER_SITE_OTHER): Payer: Self-pay | Admitting: Nurse Practitioner

## 2021-01-13 VITALS — BP 112/68 | HR 69 | Temp 97.4°F | Ht 73.0 in | Wt 248.8 lb

## 2021-01-13 DIAGNOSIS — E1122 Type 2 diabetes mellitus with diabetic chronic kidney disease: Secondary | ICD-10-CM | POA: Diagnosis not present

## 2021-01-13 DIAGNOSIS — N184 Chronic kidney disease, stage 4 (severe): Secondary | ICD-10-CM

## 2021-01-13 DIAGNOSIS — G4733 Obstructive sleep apnea (adult) (pediatric): Secondary | ICD-10-CM | POA: Diagnosis not present

## 2021-01-13 DIAGNOSIS — M109 Gout, unspecified: Secondary | ICD-10-CM

## 2021-01-13 DIAGNOSIS — E118 Type 2 diabetes mellitus with unspecified complications: Secondary | ICD-10-CM

## 2021-01-13 DIAGNOSIS — E782 Mixed hyperlipidemia: Secondary | ICD-10-CM

## 2021-01-13 DIAGNOSIS — I5032 Chronic diastolic (congestive) heart failure: Secondary | ICD-10-CM | POA: Diagnosis not present

## 2021-01-13 DIAGNOSIS — N183 Chronic kidney disease, stage 3 unspecified: Secondary | ICD-10-CM | POA: Diagnosis not present

## 2021-01-13 DIAGNOSIS — I10 Essential (primary) hypertension: Secondary | ICD-10-CM

## 2021-01-13 DIAGNOSIS — J449 Chronic obstructive pulmonary disease, unspecified: Secondary | ICD-10-CM

## 2021-01-13 DIAGNOSIS — R5383 Other fatigue: Secondary | ICD-10-CM

## 2021-01-13 MED ORDER — AMLODIPINE BESYLATE 5 MG PO TABS: 5.0000 mg | ORAL_TABLET | Freq: Every day | ORAL | 1 refills | Status: AC

## 2021-01-13 MED ORDER — ALBUTEROL SULFATE (2.5 MG/3ML) 0.083% IN NEBU: 2.5000 mg | INHALATION_SOLUTION | Freq: Four times a day (QID) | RESPIRATORY_TRACT | 12 refills | Status: AC | PRN

## 2021-01-13 MED ORDER — ALLOPURINOL 100 MG PO TABS: 100.0000 mg | ORAL_TABLET | Freq: Every day | ORAL | 1 refills | Status: AC

## 2021-01-13 MED ORDER — CARVEDILOL 25 MG PO TABS: 25.0000 mg | ORAL_TABLET | Freq: Two times a day (BID) | ORAL | 1 refills | Status: AC

## 2021-01-13 MED ORDER — ISOSORBIDE MONONITRATE ER 60 MG PO TB24: 60.0000 mg | ORAL_TABLET | Freq: Three times a day (TID) | ORAL | 3 refills | Status: AC

## 2021-01-13 MED ORDER — POTASSIUM CHLORIDE ER 10 MEQ PO TBCR: 20.0000 meq | EXTENDED_RELEASE_TABLET | Freq: Every day | ORAL | 1 refills | Status: AC

## 2021-01-13 MED ORDER — BUDESONIDE-FORMOTEROL FUMARATE 160-4.5 MCG/ACT IN AERO: 2.0000 | INHALATION_SPRAY | Freq: Two times a day (BID) | RESPIRATORY_TRACT | 3 refills | Status: AC

## 2021-01-13 MED ORDER — NITROGLYCERIN 0.4 MG SL SUBL: SUBLINGUAL_TABLET | SUBLINGUAL | 3 refills | Status: AC

## 2021-01-13 MED ORDER — GABAPENTIN 300 MG PO CAPS: 300.0000 mg | ORAL_CAPSULE | Freq: Two times a day (BID) | ORAL | 1 refills | Status: AC

## 2021-01-13 MED ORDER — SIMVASTATIN 40 MG PO TABS: 40.0000 mg | ORAL_TABLET | Freq: Every day | ORAL | 1 refills | Status: DC

## 2021-01-13 MED ORDER — TRULICITY 3 MG/0.5ML ~~LOC~~ SOAJ: 3.0000 mg | SUBCUTANEOUS | 3 refills | Status: AC

## 2021-01-13 MED ORDER — GLIPIZIDE 10 MG PO TABS: 10.0000 mg | ORAL_TABLET | Freq: Two times a day (BID) | ORAL | 3 refills | Status: AC

## 2021-01-13 MED ORDER — ASPIRIN EC 81 MG PO TBEC
81.0000 mg | DELAYED_RELEASE_TABLET | Freq: Every day | ORAL | 1 refills | Status: AC
Start: 2021-01-13 — End: ?

## 2021-01-13 MED ORDER — FUROSEMIDE 80 MG PO TABS
80.0000 mg | ORAL_TABLET | Freq: Two times a day (BID) | ORAL | 4 refills | Status: AC
Start: 2021-01-13 — End: ?

## 2021-01-13 NOTE — Patient Instructions (Addendum)
Call Dr. Olevia Perches office (the gastroenterologist) to discuss colon cancer screening. [phone]. You referred to them in July.  Call [facility] to Request a new patient appointment to become established with a new PCP at their office [place] SO YOU CAN GET [place].

## 2021-01-13 NOTE — Progress Notes (Signed)
Subjective:  Patient ID: [name], male    DOB: 10/11/1945  Age: 75 y.o. MRN: [id number]  CC:  Chief Complaint  Patient presents with   Follow-up    Needs refills of Trulicity and Isosorbide and any other medicine needed, discuss new doctor   Fatigue   Chronic Kidney Disease   Sleep Apnea      HPI  This patient arrives today for the above.  Fatigue: At last visit we checked some blood work for further evaluation.  No anemia was noted, kidney function was stable, thyroid tests were normal.  He tells me today he is actually feeling like he has more energy than he was having.  Overall he is feeling well and has no acute complaints.  I also referred him to gastroenterology so he could undergo colon cancer screening to make sure he is up-to-date with that.  He tells me he has not heard from their office yet.  Chronic kidney disease: He was referred to a nephrologist for assistance with managing his chronic kidney disease.  He has seen the nephrologist and is undergoing work-up for his chronic kidney disease.  He seems happy with the care he is receiving from his new nephrologist.  Sleep apnea: He was referred to sleep medicine for further evaluation of his sleep apnea as he feels that his settings may be out of date and he may need a new CPAP machine.  He tells me he missed a schedulers call to get a sleep study scheduled.  He has been trying to call them back but has been unable to contact them.  He is also requesting refills on his isosorbide and Trulicity.  Past Medical History:  Diagnosis Date   AAA (abdominal aortic aneurysm) ([place])    Followed by Dr. Donnetta Hutching   AICD (automatic cardioverter/defibrillator) present    Angioedema    Secondary to ACE inhibitor   Arthritis    Bell palsy    Carotid artery disease (HCC)    CHF (congestive heart failure) (HCC)    Coronary atherosclerosis of native coronary artery    Multivessel status post CABG   Essential hypertension,  benign    Gout    Hernia of abdominal wall    Hypercholesteremia    Ischemic cardiomyopathy    LVEF 45-50% 11/2011   Migraines    Morbid obesity ([place]) 02/12/2019   Myocardial infarction ([place])    Nephrolithiasis    PAD (peripheral artery disease) ([place])    Followed by Dr. 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   Social History   Tobacco Use   Smoking status: Former    Packs/day: 1.00    Years: 25.00    Pack years: 25.00    Types: Cigarettes    Start date: 02/28/1979    Quit date: 05/23/2007    Years since quitting: 13.6   Smokeless tobacco: Never  Substance Use Topics   Alcohol use: No  Alcohol/week: 0.0 standard drinks     Current Meds  Medication Sig   acetaminophen (TYLENOL) 325 MG tablet Take by mouth.   albuterol (PROVENTIL) (2.5 MG/3ML) 0.083% nebulizer solution Take 3 mLs (2.5 mg total) by nebulization every 6 (six) hours as needed for wheezing or shortness of breath.   allopurinol (ZYLOPRIM) 100 MG tablet TAKE 1 TABLET BY MOUTH ONCE DAILY.   amLODipine (NORVASC) 5 MG tablet TAKE 1 TABLET ONCE DAILY.   aspirin EC 81 MG tablet Take 1 tablet (81 mg total) by mouth daily with breakfast.   budesonide-formoterol (SYMBICORT) 160-4.5 MCG/ACT inhaler Inhale 2 puffs into the lungs 2 (two) times daily.   carvedilol (COREG) 25 MG tablet TAKE 1 TABLET BY MOUTH TWICE DAILY WITH A MEAL.   Cholecalciferol (VITAMIN D-3) 125 MCG (5000 UT) TABS Take 2 tablets by mouth daily.   docusate sodium (COLACE) 100 MG capsule Take 1 capsule (100 mg total) by mouth every 12 (twelve) hours.   furosemide (LASIX) 80 MG tablet TAKE (1)  TABLET TWICE DAILY.   gabapentin (NEURONTIN) 300 MG capsule TAKE 1 CAPSULE BY MOUTH TWICE DAILY.   glipiZIDE (GLUCOTROL) 10 MG tablet Take 1 tablet (10 mg total) by mouth 2 (two) times daily before a meal.   nitroGLYCERIN (NITROSTAT) 0.4 MG SL tablet PLACE 0.4 MG  UNDER THE TONGUE EVERY 5 MINUTES UP TO 3 DOSES AS NEEDED FOR CHEST PAIN.   potassium chloride (KLOR-CON) 10 MEQ tablet TAKE 2 TABLETS BY MOUTH DAILY.   simvastatin (ZOCOR) 40 MG tablet TAKE 1 TABLET ONCE DAILY.   vitamin B-12 (CYANOCOBALAMIN) 1000 MCG tablet Take 1,000 mcg by mouth daily.   [DISCONTINUED] isosorbide mononitrate (IMDUR) 60 MG 24 hr tablet TAKE 1 TABLET BY MOUTH 3 TIMES DAILY.   [DISCONTINUED] TRULICITY 3 0000000 SOPN Inject into the skin.    ROS:  Review of Systems  Eyes:  Negative for blurred vision.  Respiratory:  Negative for cough and shortness of breath.   Cardiovascular:  Negative for chest pain.  Neurological:  Negative for dizziness, loss of consciousness and headaches.    Objective:   Today's Vitals: BP 112/68   Pulse 69   Temp (!) 97.4 F (36.3 C) (Temporal)   Ht '6\' 1"'$  (1.854 m)   Wt 248 lb 12.8 oz (112.9 kg)   SpO2 96%   BMI 32.83 kg/m  Vitals with BMI 01/13/2021 12/07/2020 09/21/2020  Height '6\' 1"'$  '6\' 1"'$  '6\' 1"'$   Weight 248 lbs 13 oz 251 lbs 3 oz 251 lbs 13 oz  BMI 32.83 [id number]  Systolic [id number]  Diastolic 68 60 76  Pulse 69 80 78     Physical Exam Vitals reviewed.  Constitutional:      Appearance: Normal appearance.  HENT:     Head: Normocephalic and atraumatic.  Cardiovascular:     Rate and Rhythm: Normal rate and regular rhythm.  Pulmonary:     Effort: Pulmonary effort is normal.     Breath sounds: Normal breath sounds.  Musculoskeletal:     Cervical back: Neck supple.  Skin:    General: Skin is warm and dry.  Neurological:     Mental Status: He is alert and oriented to person, place, and time.  Psychiatric:        Mood and Affect: Mood normal.        Behavior:  Behavior normal.        Thought Content: Thought content normal.        Judgment: Judgment  normal.         Assessment and Plan   1. Diabetes mellitus with stage 4 chronic kidney disease ([place])   2. Chronic diastolic CHF (congestive heart failure) ([place])   3. Chronic renal impairment, stage 3 (moderate), unspecified whether stage 3a or 3b CKD ([place])   4. OSA (obstructive sleep apnea)   5. Fatigue, unspecified type      Plan: 1.  Trulicity refilled. 2.  Isosorbide refilled. 3.  He will continue to follow-up with nephrology as scheduled. 4.  He was encouraged to continue calling the schedule back for the sleep study.  I do see that he has an appointment with sleep medicine coming up in September.  He was encouraged to keep this appointment. 5.  He was encouraged to reach out to gastroenterology, and I provided him with their phone number.  He was also encouraged to discuss this with his new PCP once he establishes care with a new one.  He was given phone number to [facility] to see if he could be excepted as a patient there.   Tests ordered No orders of the defined types were placed in this encounter.     Meds ordered this encounter  Medications   isosorbide mononitrate (IMDUR) 60 MG 24 hr tablet    Sig: Take 1 tablet (60 mg total) by mouth 3 (three) times daily.    Dispense:  90 tablet    Refill:  3    Order Specific Question:   Supervising Provider    Answer:   Lindell Spar [id number]   TRULICITY 3 0000000 SOPN    Sig: Inject 3 mg into the skin once a week.    Dispense:  2 mL    Refill:  3    Order Specific Question:   Supervising Provider    Answer:   Lindell Spar [id number]    Patient not scheduled for follow-up as this office is closing permanently as of 01/19/21 due to the passing of Dr. Anastasio Champion.  The patient was notified of this and that they will need to find a new primary care provider.  They express understanding.   Ailene Ards, NP

## 2021-01-24 ENCOUNTER — Other Ambulatory Visit: Payer: Self-pay | Admitting: Cardiology

## 2021-01-24 ENCOUNTER — Other Ambulatory Visit (INDEPENDENT_AMBULATORY_CARE_PROVIDER_SITE_OTHER): Payer: Self-pay | Admitting: Nurse Practitioner

## 2021-01-24 DIAGNOSIS — I10 Essential (primary) hypertension: Secondary | ICD-10-CM

## 2021-01-24 DIAGNOSIS — I5032 Chronic diastolic (congestive) heart failure: Secondary | ICD-10-CM

## 2021-01-28 ENCOUNTER — Other Ambulatory Visit: Payer: Self-pay

## 2021-01-28 ENCOUNTER — Encounter: Payer: Self-pay | Admitting: Pulmonary Disease

## 2021-01-28 ENCOUNTER — Ambulatory Visit (INDEPENDENT_AMBULATORY_CARE_PROVIDER_SITE_OTHER): Payer: Medicare Other | Admitting: Pulmonary Disease

## 2021-01-28 DIAGNOSIS — J441 Chronic obstructive pulmonary disease with (acute) exacerbation: Secondary | ICD-10-CM

## 2021-01-28 DIAGNOSIS — Z9989 Dependence on other enabling machines and devices: Secondary | ICD-10-CM

## 2021-01-28 DIAGNOSIS — G4733 Obstructive sleep apnea (adult) (pediatric): Secondary | ICD-10-CM

## 2021-01-28 NOTE — Progress Notes (Signed)
Subjective:    Patient ID: [name], male    DOB: 1946-04-14, 74 y.o.   MRN: [id number]  HPI  Chief Complaint  Patient presents with   Consult    Sleep Consult. Pt. Says he already has a cpap machine but feels like it could be losing pressure.    75 year old ex-smoker presents to establish care for OSA and COPD. He underwent a sleep study at [facility] which showed moderate to severe OSA and has been maintained on CPAP of 11 cm since then.  He has tried various masks but has settled with an AirFit F30 fullface mask He was hospitalized in 2019 for shortness of breath and wheezing and was told that he has COPD and has been maintained on Symbicort since then.  He has a 25-pack-year history of smoking and quit 2020  PMH -diabetes type 2, chronic systolic heart failure, PPM/AICD. CABG 2009 TAA repair 2015   Epworth Sleepiness Scale is 10. Bedtime is around midnight, sleep latency is minimal, he sleeps on his side with 1 pillow, denies nocturnal awakenings and is out of bed by 8 AM feeling refreshed without dryness of mouth or headaches There is no history suggestive of cataplexy, sleep paralysis or parasomnias   Significant tests/ events reviewed  NPSG 2012 AHI 16/hour, RDI 35/hour. 08/2010 CPAP titration 11 cm  04/2019 CT chest without contrast mild emphysema   Past Medical History:  Diagnosis Date   AAA (abdominal aortic aneurysm) ([place])    Followed by Dr. Donnetta Hutching   AICD (automatic cardioverter/defibrillator) present    Angioedema    Secondary to ACE inhibitor   Arthritis    Bell palsy    Carotid artery disease (HCC)    CHF (congestive heart failure) (HCC)    Coronary atherosclerosis of native coronary artery    Multivessel status post CABG   Essential hypertension, benign    Gout    Hernia of abdominal wall    Hypercholesteremia    Ischemic cardiomyopathy    LVEF 45-50% 11/2011   Migraines    Morbid obesity ([place]) 02/12/2019   Myocardial infarction ([place])     Nephrolithiasis    PAD (peripheral artery disease) ([place])    Followed by Dr. Donnetta Hutching   Sleep apnea    Type 2 diabetes mellitus [facility])      Past Surgical History:  Procedure Laterality Date   ABDOMINAL AORTIC ANEURYSM REPAIR  November 19, 2013   [facility] :  Dr. Sammuel Hines   ABDOMINAL AORTIC ANEURYSM REPAIR  09-07-2014   [facility]   BI-VENTRICULAR IMPLANTABLE CARDIOVERTER DEFIBRILLATOR  (CRT-D)  11/05/2014   BYPASS GRAFT POPLITEAL TO POPLITEAL Left 06/08/2014   Procedure: BYPASS GRAFT FEMORAL ARTERY TO ABOVE KNEE POPLITEAL;  Surgeon: Rosetta Posner, MD;  Location: [facility] OR;  Service: Vascular;  Laterality: Left;   CARDIAC CATHETERIZATION N/A 10/02/2014   Procedure: Left Heart Cath and Cors/Grafts Angiography;  Surgeon: Belva Crome, MD;  Location: [place] CV LAB;  Service: Cardiovascular;  Laterality: N/A;   CARDIAC CATHETERIZATION  "several"   CATARACT EXTRACTION W/PHACO  03/23/2011   Procedure: CATARACT EXTRACTION PHACO AND INTRAOCULAR LENS PLACEMENT (IOC);  Surgeon: Tonny Branch;  Location: [facility] OR[facility];  Service: Ophthalmology;  Laterality: Left;  CDE: 15.99   CATARACT EXTRACTION W/PHACO  04/17/2011   Procedure: CATARACT EXTRACTION PHACO AND INTRAOCULAR LENS PLACEMENT (IOC);  Surgeon: Tonny Branch;  Location: [facility] ORS;  Service: Ophthalmology;  Laterality: Right;  CDE: 23.45   CORONARY ARTERY BYPASS GRAFT  2001   LIMA to LAD, SVG to diagonal and ramus, SVG to OM, SVG to AM   DUPUYTREN CONTRACTURE RELEASE Left 2009   EP IMPLANTABLE DEVICE N/A 11/05/2014   Procedure: BiV ICD Insertion CRT-D;  Surgeon: Evans Lance, MD;  Location: [place] CV LAB;  Service: Cardiovascular;  Laterality: N/A;   EP IMPLANTABLE DEVICE N/A 02/24/2015   Procedure: Lead Revision/Repair;  Surgeon: Evans Lance, MD;  Location: [place] CV LAB;  Service: Cardiovascular;  Laterality: N/A;   EXTRACORPOREAL SHOCK WAVE LITHOTRIPSY  X 1   EYE SURGERY     FOOT SURGERY Left 1963   "gangrene"   ICD LEAD REMOVAL  02/24/2015    LOWER EXTREMITY ANGIOGRAM Bilateral 04/28/2015   Procedure: Lower Extremity Angiogram;  Surgeon: Rosetta Posner, MD;  Location: [place] CV LAB;  Service: Cardiovascular;  Laterality: Bilateral;   PERIPHERAL VASCULAR CATHETERIZATION N/A 04/28/2015   Procedure: Abdominal Aortogram;  Surgeon: Rosetta Posner, MD;  Location: [place] CV LAB;  Service: Cardiovascular;  Laterality: N/A;   Allergies  Allergen Reactions   Ace Inhibitors Swelling and Anaphylaxis    Throat and lips swelled; ended up in ED Throat and lips swelled; ended up in ED Throat and lips swelled; ended up in ED   Codeine Other (See Comments) and Anxiety     Delirium, hallucinations REACTION: delirium REACTION: delirium Other reaction(s): Other (See Comments)  delirium Pt states he does things that he can't control, acts irrationally. Had [place] flu at the time.  REACTION: delirium  Delirium, hallucinations REACTION: delirium REACTION: delirium Other reaction(s): Other (See Comments)  delirium Pt states he does things that he can't control, acts irrationally. Had [place] flu at the time. REACTION: delirium Other reaction(s): Other (See Comments)  delirium Pt states he does things that he can't control, acts irrationally. Had [place] flu at the time.   Hydromorphone Nausea And Vomiting   Pneumococcal Polysaccharide Vaccine Rash     Social History   Socioeconomic History   Marital status: Divorced    Spouse name: Not on file   Number of children: Not on file   Years of education: Not on file   Highest education level: Not on file  Occupational History   Not on file  Tobacco Use   Smoking status: Former    Packs/day: 1.00    Years: 25.00    Pack years: 25.00    Types: Cigarettes    Start date: 02/28/1979    Quit date: 05/23/2007    Years since quitting: 13.6   Smokeless tobacco: Never  Vaping Use   Vaping Use: Former  Substance and Sexual Activity   Alcohol use: No    Alcohol/week: 0.0 standard  drinks   Drug use: No   Sexual activity: Never  Other Topics Concern   Not on file  Social History Narrative   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   Social Determinants of Health   Financial Resource Strain: Not on file  Food Insecurity: Not on file  Transportation Needs: Not on file  Physical Activity: Not on file  Stress: Not on file  Social Connections: Not on file  Intimate Partner Violence: Not on file     Family History  Problem Relation Age of Onset   Diabetes Mother        Type  I    Varicose Veins Mother    Heart disease Father 58       AAA  AAA (abdominal aortic aneurysm) Father    Hyperlipidemia Father    Hypertension Father       Review of Systems Shortness of breath with activity Nonproductive cough Irregular heartbeat Nasal congestion sneezing Feet swelling   Constitutional: negative for anorexia, fevers and sweats  Eyes: negative for irritation, redness and visual disturbance  Ears, nose, mouth, throat, and face: negative for earaches, epistaxis, nasal congestion and sore throat  Respiratory: negative for cough, sputum and wheezing  Cardiovascular: negative for chest pain, orthopnea, palpitations and syncope  Gastrointestinal: negative for abdominal pain, constipation, diarrhea, melena, nausea and vomiting  Genitourinary:negative for dysuria, frequency and hematuria  Hematologic/lymphatic: negative for bleeding, easy bruising and lymphadenopathy  Musculoskeletal:negative for arthralgias, muscle weakness and stiff joints  Neurological: negative for coordination problems, gait problems, headaches and weakness  Endocrine: negative for diabetic symptoms including polydipsia, polyuria and weight loss     Objective:   Physical Exam  Gen. Pleasant, obese, in no distress, normal affect ENT - no pallor,icterus, no post nasal drip, class 2-3 airway Neck: No JVD, no thyromegaly, no carotid bruits Lungs: no use of accessory muscles,  no dullness to percussion, decreased without rales or rhonchi  Cardiovascular: Rhythm regular, heart sounds  normal, no murmurs or gallops, 1+ peripheral edema Abdomen: soft and non-tender, no hepatosplenomegaly, BS normal. Musculoskeletal: No deformities, no cyanosis or clubbing Neuro:  alert, non focal, no tremors       Assessment & Plan:

## 2021-01-28 NOTE — Patient Instructions (Signed)
We will send in Rx [facility] 10-15 [facility] Check PFTs

## 2021-01-28 NOTE — Assessment & Plan Note (Signed)
He was told that he had COPD when he was hospitalized in 2019 and treated for an exacerbation.  Since then he has been maintained on Symbicort.  PFTs have not been obtained. We will start with obtaining PFTs and advised him whether this needs to be continued

## 2021-01-28 NOTE — Assessment & Plan Note (Signed)
His current machine is more than 73 years old and he would be eligible for replacement.  He would like he had moderate to severe OSA on his baseline study in 2012 which was done at [facility].  He is not sure whether he needs more pressure, seems like he required 11 cm, download is not available. Will write prescription for new auto CPAP 10 to 15 cm and fine-tune settings after reviewing a download He is very compliant by report and CPAP is certainly helped improve his daytime somnolence and fatigue  Weight loss encouraged, compliance with goal of at least 4-6 hrs every night is the expectation. Advised against medications with sedative side effects Cautioned against driving when sleepy - understanding that sleepiness will vary on a day to day basis

## 2021-02-02 ENCOUNTER — Other Ambulatory Visit: Payer: Self-pay | Admitting: Cardiology

## 2021-02-02 DIAGNOSIS — E782 Mixed hyperlipidemia: Secondary | ICD-10-CM

## 2021-02-16 ENCOUNTER — Institutional Professional Consult (permissible substitution): Payer: Medicare Other | Admitting: Neurology

## 2021-02-19 DEATH — deceased

## 2021-04-29 ENCOUNTER — Ambulatory Visit: Payer: Medicare Other | Admitting: Nurse Practitioner

## 2021-05-03 ENCOUNTER — Ambulatory Visit: Payer: Medicare Other | Admitting: Internal Medicine
